# Patient Record
Sex: Female | Born: 1950 | Hispanic: No | Marital: Married | Smoking: Never smoker
Health system: Southern US, Community
[De-identification: ages and names within clinical notes are randomized; demographics above are authoritative.]

## PROBLEM LIST (undated history)

## (undated) DIAGNOSIS — S0280XA Fracture of other specified skull and facial bones, unspecified side, initial encounter for closed fracture: Secondary | ICD-10-CM

## (undated) DIAGNOSIS — R269 Unspecified abnormalities of gait and mobility: Secondary | ICD-10-CM

## (undated) DIAGNOSIS — K219 Gastro-esophageal reflux disease without esophagitis: Secondary | ICD-10-CM

## (undated) DIAGNOSIS — E0590 Thyrotoxicosis, unspecified without thyrotoxic crisis or storm: Secondary | ICD-10-CM

## (undated) DIAGNOSIS — Z7409 Other reduced mobility: Secondary | ICD-10-CM

## (undated) DIAGNOSIS — E559 Vitamin D deficiency, unspecified: Secondary | ICD-10-CM

## (undated) DIAGNOSIS — I6200 Nontraumatic subdural hemorrhage, unspecified: Secondary | ICD-10-CM

## (undated) DIAGNOSIS — W19XXXA Unspecified fall, initial encounter: Secondary | ICD-10-CM

## (undated) DIAGNOSIS — E7800 Pure hypercholesterolemia, unspecified: Secondary | ICD-10-CM

## (undated) DIAGNOSIS — Y92009 Unspecified place in unspecified non-institutional (private) residence as the place of occurrence of the external cause: Secondary | ICD-10-CM

## (undated) DIAGNOSIS — R569 Unspecified convulsions: Secondary | ICD-10-CM

## (undated) DIAGNOSIS — R0602 Shortness of breath: Secondary | ICD-10-CM

## (undated) HISTORY — DX: Unspecified abnormalities of gait and mobility: R26.9

## (undated) HISTORY — DX: Nontraumatic subdural hemorrhage, unspecified: I62.00

## (undated) HISTORY — DX: Fracture of other specified skull and facial bones, unspecified side, initial encounter for closed fracture: S02.80XA

---
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
## 2003-10-31 HISTORY — PX: BRAIN SURGERY: SHX531

## 2006-07-02 ENCOUNTER — Other Ambulatory Visit: Admission: RE | Admit: 2006-07-02 | Discharge: 2006-07-02 | Payer: Self-pay | Admitting: *Deleted

## 2006-11-12 ENCOUNTER — Encounter: Admission: RE | Admit: 2006-11-12 | Discharge: 2006-12-08 | Payer: Self-pay | Admitting: Neurology

## 2006-12-09 ENCOUNTER — Encounter: Admission: RE | Admit: 2006-12-09 | Discharge: 2007-01-12 | Payer: Self-pay | Admitting: Neurology

## 2007-01-13 ENCOUNTER — Encounter: Admission: RE | Admit: 2007-01-13 | Discharge: 2007-02-15 | Payer: Self-pay | Admitting: Neurology

## 2007-05-05 ENCOUNTER — Emergency Department (HOSPITAL_COMMUNITY): Admission: EM | Admit: 2007-05-05 | Discharge: 2007-05-05 | Payer: Self-pay | Admitting: *Deleted

## 2007-09-10 ENCOUNTER — Emergency Department (HOSPITAL_COMMUNITY): Admission: EM | Admit: 2007-09-10 | Discharge: 2007-09-10 | Payer: Self-pay | Admitting: Emergency Medicine

## 2007-10-05 ENCOUNTER — Emergency Department (HOSPITAL_COMMUNITY): Admission: EM | Admit: 2007-10-05 | Discharge: 2007-10-05 | Payer: Self-pay | Admitting: Emergency Medicine

## 2007-12-28 ENCOUNTER — Ambulatory Visit: Payer: Self-pay | Admitting: Internal Medicine

## 2007-12-28 DIAGNOSIS — R059 Cough, unspecified: Secondary | ICD-10-CM | POA: Insufficient documentation

## 2007-12-28 DIAGNOSIS — R05 Cough: Secondary | ICD-10-CM

## 2007-12-30 ENCOUNTER — Telehealth (INDEPENDENT_AMBULATORY_CARE_PROVIDER_SITE_OTHER): Payer: Self-pay | Admitting: *Deleted

## 2008-01-31 ENCOUNTER — Ambulatory Visit: Payer: Self-pay | Admitting: Internal Medicine

## 2008-02-01 ENCOUNTER — Ambulatory Visit: Payer: Self-pay | Admitting: Cardiology

## 2008-02-06 ENCOUNTER — Telehealth (INDEPENDENT_AMBULATORY_CARE_PROVIDER_SITE_OTHER): Payer: Self-pay | Admitting: *Deleted

## 2008-04-05 ENCOUNTER — Encounter: Admission: RE | Admit: 2008-04-05 | Discharge: 2008-04-19 | Payer: Self-pay | Admitting: Neurology

## 2008-04-17 ENCOUNTER — Ambulatory Visit (HOSPITAL_COMMUNITY): Admission: RE | Admit: 2008-04-17 | Discharge: 2008-04-17 | Payer: Self-pay | Admitting: Family Medicine

## 2008-05-07 ENCOUNTER — Ambulatory Visit: Payer: Self-pay | Admitting: Internal Medicine

## 2008-06-06 ENCOUNTER — Ambulatory Visit: Payer: Self-pay | Admitting: Internal Medicine

## 2008-06-21 ENCOUNTER — Encounter: Payer: Self-pay | Admitting: Internal Medicine

## 2008-06-21 ENCOUNTER — Ambulatory Visit (HOSPITAL_COMMUNITY): Admission: RE | Admit: 2008-06-21 | Discharge: 2008-06-21 | Payer: Self-pay | Admitting: Internal Medicine

## 2008-06-29 ENCOUNTER — Ambulatory Visit: Payer: Self-pay | Admitting: Internal Medicine

## 2008-07-30 ENCOUNTER — Ambulatory Visit: Payer: Self-pay | Admitting: Internal Medicine

## 2008-09-03 ENCOUNTER — Ambulatory Visit: Payer: Self-pay | Admitting: Internal Medicine

## 2008-12-07 ENCOUNTER — Other Ambulatory Visit: Admission: RE | Admit: 2008-12-07 | Discharge: 2008-12-07 | Payer: Self-pay | Admitting: Family Medicine

## 2009-08-19 ENCOUNTER — Ambulatory Visit: Payer: Self-pay | Admitting: Psychology

## 2011-03-14 ENCOUNTER — Emergency Department (HOSPITAL_COMMUNITY): Payer: Medicare Other

## 2011-03-14 ENCOUNTER — Emergency Department (HOSPITAL_COMMUNITY)
Admission: EM | Admit: 2011-03-14 | Discharge: 2011-03-14 | Disposition: A | Discharge status: Home / Self Care | Payer: Medicare Other | Attending: Emergency Medicine | Admitting: Emergency Medicine

## 2011-03-14 DIAGNOSIS — R5381 Other malaise: Secondary | ICD-10-CM | POA: Insufficient documentation

## 2011-03-14 DIAGNOSIS — E7800 Pure hypercholesterolemia, unspecified: Secondary | ICD-10-CM | POA: Insufficient documentation

## 2011-03-14 DIAGNOSIS — Y929 Unspecified place or not applicable: Secondary | ICD-10-CM | POA: Insufficient documentation

## 2011-03-14 DIAGNOSIS — S0990XA Unspecified injury of head, initial encounter: Secondary | ICD-10-CM | POA: Insufficient documentation

## 2011-03-14 DIAGNOSIS — R7309 Other abnormal glucose: Secondary | ICD-10-CM | POA: Insufficient documentation

## 2011-03-14 DIAGNOSIS — R42 Dizziness and giddiness: Secondary | ICD-10-CM | POA: Insufficient documentation

## 2011-03-14 DIAGNOSIS — R51 Headache: Secondary | ICD-10-CM | POA: Insufficient documentation

## 2011-03-14 DIAGNOSIS — R55 Syncope and collapse: Secondary | ICD-10-CM | POA: Insufficient documentation

## 2011-03-14 DIAGNOSIS — R404 Transient alteration of awareness: Secondary | ICD-10-CM | POA: Insufficient documentation

## 2011-03-14 DIAGNOSIS — Z79899 Other long term (current) drug therapy: Secondary | ICD-10-CM | POA: Insufficient documentation

## 2011-03-14 DIAGNOSIS — W108XXA Fall (on) (from) other stairs and steps, initial encounter: Secondary | ICD-10-CM | POA: Insufficient documentation

## 2011-03-14 LAB — URINALYSIS, ROUTINE W REFLEX MICROSCOPIC
Bilirubin Urine: NEGATIVE
Glucose, UA: NEGATIVE mg/dL
Hgb urine dipstick: NEGATIVE
Nitrite: NEGATIVE
Protein, ur: NEGATIVE mg/dL
Specific Gravity, Urine: 1.008 (ref 1.005–1.030)
pH: 6.5 (ref 5.0–8.0)

## 2011-03-14 LAB — DIFFERENTIAL
Eosinophils Absolute: 0.1 10*3/uL (ref 0.0–0.7)
Lymphocytes Relative: 19 % (ref 12–46)
Neutro Abs: 6 10*3/uL (ref 1.7–7.7)

## 2011-03-14 LAB — CBC
Hemoglobin: 13.3 g/dL (ref 12.0–15.0)
MCHC: 34.8 g/dL (ref 30.0–36.0)
Platelets: 212 10*3/uL (ref 150–400)
RDW: 12.1 % (ref 11.5–15.5)

## 2011-03-14 LAB — BASIC METABOLIC PANEL
CO2: 24 mEq/L (ref 19–32)
Calcium: 9.2 mg/dL (ref 8.4–10.5)
GFR calc Af Amer: >60 mL/min (ref >60)
Sodium: 134 mEq/L — ABNORMAL LOW (ref 135–145)

## 2011-04-09 ENCOUNTER — Ambulatory Visit: Payer: Medicare Other | Attending: Family Medicine | Admitting: Physical Therapy

## 2011-04-09 DIAGNOSIS — R269 Unspecified abnormalities of gait and mobility: Secondary | ICD-10-CM | POA: Insufficient documentation

## 2011-04-09 DIAGNOSIS — R262 Difficulty in walking, not elsewhere classified: Secondary | ICD-10-CM | POA: Insufficient documentation

## 2011-04-09 DIAGNOSIS — IMO0001 Reserved for inherently not codable concepts without codable children: Secondary | ICD-10-CM | POA: Insufficient documentation

## 2011-04-09 DIAGNOSIS — M542 Cervicalgia: Secondary | ICD-10-CM | POA: Insufficient documentation

## 2011-04-09 DIAGNOSIS — M25519 Pain in unspecified shoulder: Secondary | ICD-10-CM | POA: Insufficient documentation

## 2011-04-15 ENCOUNTER — Ambulatory Visit: Payer: Medicare Other | Admitting: Physical Therapy

## 2011-04-21 ENCOUNTER — Ambulatory Visit: Payer: Medicare Other | Admitting: Physical Therapy

## 2011-04-23 ENCOUNTER — Ambulatory Visit: Payer: Medicare Other | Admitting: Physical Therapy

## 2011-04-28 ENCOUNTER — Ambulatory Visit: Payer: Medicare Other | Admitting: Physical Therapy

## 2011-04-30 ENCOUNTER — Encounter: Payer: Medicare Other | Admitting: Physical Therapy

## 2011-04-30 ENCOUNTER — Ambulatory Visit: Payer: Medicare Other | Attending: Family Medicine | Admitting: Physical Therapy

## 2011-04-30 DIAGNOSIS — M25519 Pain in unspecified shoulder: Secondary | ICD-10-CM | POA: Insufficient documentation

## 2011-04-30 DIAGNOSIS — M542 Cervicalgia: Secondary | ICD-10-CM | POA: Insufficient documentation

## 2011-04-30 DIAGNOSIS — R269 Unspecified abnormalities of gait and mobility: Secondary | ICD-10-CM | POA: Insufficient documentation

## 2011-04-30 DIAGNOSIS — IMO0001 Reserved for inherently not codable concepts without codable children: Secondary | ICD-10-CM | POA: Insufficient documentation

## 2011-04-30 DIAGNOSIS — R262 Difficulty in walking, not elsewhere classified: Secondary | ICD-10-CM | POA: Insufficient documentation

## 2011-05-05 ENCOUNTER — Ambulatory Visit: Payer: Medicare Other | Attending: Family Medicine | Admitting: Physical Therapy

## 2011-05-05 DIAGNOSIS — R262 Difficulty in walking, not elsewhere classified: Secondary | ICD-10-CM | POA: Insufficient documentation

## 2011-05-05 DIAGNOSIS — M542 Cervicalgia: Secondary | ICD-10-CM | POA: Insufficient documentation

## 2011-05-05 DIAGNOSIS — M25519 Pain in unspecified shoulder: Secondary | ICD-10-CM | POA: Insufficient documentation

## 2011-05-05 DIAGNOSIS — IMO0001 Reserved for inherently not codable concepts without codable children: Secondary | ICD-10-CM | POA: Insufficient documentation

## 2011-05-05 DIAGNOSIS — R269 Unspecified abnormalities of gait and mobility: Secondary | ICD-10-CM | POA: Insufficient documentation

## 2011-05-07 ENCOUNTER — Ambulatory Visit: Payer: Medicare Other | Admitting: Physical Therapy

## 2011-05-12 ENCOUNTER — Ambulatory Visit: Payer: Medicare Other | Admitting: Physical Therapy

## 2011-05-14 ENCOUNTER — Ambulatory Visit: Payer: Medicare Other | Admitting: Physical Therapy

## 2011-09-08 LAB — BASIC METABOLIC PANEL
BUN: 14
Calcium: 8.5
Creatinine, Ser: 0.87
GFR calc Af Amer: >60
GFR calc non Af Amer: >60
Glucose, Bld: 154 — ABNORMAL HIGH
Sodium: 135

## 2011-09-08 LAB — DIFFERENTIAL
Basophils Relative: 0
Eosinophils Relative: 0
Lymphocytes Relative: 6 — ABNORMAL LOW
Neutro Abs: 11.3 — ABNORMAL HIGH

## 2011-09-08 LAB — URINALYSIS, ROUTINE W REFLEX MICROSCOPIC: Urobilinogen, UA: 0.2

## 2011-09-08 LAB — CBC
HCT: 36.5
Hemoglobin: 12.9
MCHC: 35.3
MCV: 90.9
RBC: 4.02

## 2011-09-08 LAB — URINE MICROSCOPIC-ADD ON

## 2012-01-18 ENCOUNTER — Other Ambulatory Visit (HOSPITAL_COMMUNITY)
Admission: RE | Admit: 2012-01-18 | Discharge: 2012-01-18 | Disposition: A | Discharge status: Home / Self Care | Payer: Medicare Other | Source: Ambulatory Visit | Attending: Family Medicine | Admitting: Family Medicine

## 2012-01-18 ENCOUNTER — Other Ambulatory Visit: Payer: Self-pay | Admitting: Family Medicine

## 2012-01-18 DIAGNOSIS — Z124 Encounter for screening for malignant neoplasm of cervix: Secondary | ICD-10-CM | POA: Insufficient documentation

## 2012-01-18 DIAGNOSIS — E785 Hyperlipidemia, unspecified: Secondary | ICD-10-CM | POA: Diagnosis not present

## 2012-01-18 DIAGNOSIS — Z0000 Encounter for general adult medical examination without abnormal findings: Secondary | ICD-10-CM | POA: Diagnosis not present

## 2012-01-18 DIAGNOSIS — F8089 Other developmental disorders of speech and language: Secondary | ICD-10-CM | POA: Diagnosis not present

## 2012-01-18 DIAGNOSIS — M949 Disorder of cartilage, unspecified: Secondary | ICD-10-CM | POA: Diagnosis not present

## 2012-01-18 DIAGNOSIS — E559 Vitamin D deficiency, unspecified: Secondary | ICD-10-CM | POA: Diagnosis not present

## 2012-01-18 DIAGNOSIS — M899 Disorder of bone, unspecified: Secondary | ICD-10-CM | POA: Diagnosis not present

## 2012-01-18 DIAGNOSIS — R269 Unspecified abnormalities of gait and mobility: Secondary | ICD-10-CM | POA: Diagnosis not present

## 2012-01-18 DIAGNOSIS — Z23 Encounter for immunization: Secondary | ICD-10-CM | POA: Diagnosis not present

## 2012-01-28 DIAGNOSIS — M899 Disorder of bone, unspecified: Secondary | ICD-10-CM | POA: Diagnosis not present

## 2012-01-28 DIAGNOSIS — M949 Disorder of cartilage, unspecified: Secondary | ICD-10-CM | POA: Diagnosis not present

## 2012-01-28 DIAGNOSIS — Z1231 Encounter for screening mammogram for malignant neoplasm of breast: Secondary | ICD-10-CM | POA: Diagnosis not present

## 2012-02-11 DIAGNOSIS — N63 Unspecified lump in unspecified breast: Secondary | ICD-10-CM | POA: Diagnosis not present

## 2012-03-30 DIAGNOSIS — J309 Allergic rhinitis, unspecified: Secondary | ICD-10-CM | POA: Diagnosis not present

## 2012-03-30 DIAGNOSIS — E7800 Pure hypercholesterolemia, unspecified: Secondary | ICD-10-CM | POA: Diagnosis not present

## 2012-07-14 DIAGNOSIS — E7800 Pure hypercholesterolemia, unspecified: Secondary | ICD-10-CM | POA: Diagnosis not present

## 2012-07-21 DIAGNOSIS — E7800 Pure hypercholesterolemia, unspecified: Secondary | ICD-10-CM | POA: Diagnosis not present

## 2012-07-21 DIAGNOSIS — R5381 Other malaise: Secondary | ICD-10-CM | POA: Diagnosis not present

## 2012-07-21 DIAGNOSIS — R5383 Other fatigue: Secondary | ICD-10-CM | POA: Diagnosis not present

## 2012-07-21 DIAGNOSIS — E559 Vitamin D deficiency, unspecified: Secondary | ICD-10-CM | POA: Diagnosis not present

## 2012-08-03 DIAGNOSIS — H35349 Macular cyst, hole, or pseudohole, unspecified eye: Secondary | ICD-10-CM | POA: Diagnosis not present

## 2012-08-03 DIAGNOSIS — H2510 Age-related nuclear cataract, unspecified eye: Secondary | ICD-10-CM | POA: Diagnosis not present

## 2012-08-04 DIAGNOSIS — H5200 Hypermetropia, unspecified eye: Secondary | ICD-10-CM | POA: Diagnosis not present

## 2012-08-04 DIAGNOSIS — H2510 Age-related nuclear cataract, unspecified eye: Secondary | ICD-10-CM | POA: Diagnosis not present

## 2012-08-04 DIAGNOSIS — H52229 Regular astigmatism, unspecified eye: Secondary | ICD-10-CM | POA: Diagnosis not present

## 2012-08-15 DIAGNOSIS — H2510 Age-related nuclear cataract, unspecified eye: Secondary | ICD-10-CM | POA: Diagnosis not present

## 2012-09-06 DIAGNOSIS — R040 Epistaxis: Secondary | ICD-10-CM | POA: Diagnosis not present

## 2012-09-12 DIAGNOSIS — R040 Epistaxis: Secondary | ICD-10-CM | POA: Diagnosis not present

## 2012-09-30 ENCOUNTER — Encounter (HOSPITAL_COMMUNITY): Payer: Self-pay | Admitting: Emergency Medicine

## 2012-09-30 ENCOUNTER — Emergency Department (HOSPITAL_COMMUNITY): Payer: Medicare Other

## 2012-09-30 ENCOUNTER — Emergency Department (HOSPITAL_COMMUNITY)
Admission: EM | Admit: 2012-09-30 | Discharge: 2012-10-01 | Disposition: A | Discharge status: Home / Self Care | Payer: Medicare Other | Attending: Emergency Medicine | Admitting: Emergency Medicine

## 2012-09-30 DIAGNOSIS — S0003XA Contusion of scalp, initial encounter: Secondary | ICD-10-CM | POA: Insufficient documentation

## 2012-09-30 DIAGNOSIS — S0990XA Unspecified injury of head, initial encounter: Secondary | ICD-10-CM | POA: Diagnosis not present

## 2012-09-30 DIAGNOSIS — E7800 Pure hypercholesterolemia, unspecified: Secondary | ICD-10-CM | POA: Insufficient documentation

## 2012-09-30 DIAGNOSIS — R269 Unspecified abnormalities of gait and mobility: Secondary | ICD-10-CM | POA: Diagnosis not present

## 2012-09-30 DIAGNOSIS — W1809XA Striking against other object with subsequent fall, initial encounter: Secondary | ICD-10-CM | POA: Insufficient documentation

## 2012-09-30 DIAGNOSIS — E559 Vitamin D deficiency, unspecified: Secondary | ICD-10-CM | POA: Insufficient documentation

## 2012-09-30 DIAGNOSIS — R221 Localized swelling, mass and lump, neck: Secondary | ICD-10-CM | POA: Diagnosis not present

## 2012-09-30 DIAGNOSIS — R220 Localized swelling, mass and lump, head: Secondary | ICD-10-CM | POA: Diagnosis not present

## 2012-09-30 DIAGNOSIS — S199XXA Unspecified injury of neck, initial encounter: Secondary | ICD-10-CM | POA: Diagnosis not present

## 2012-09-30 DIAGNOSIS — Y9389 Activity, other specified: Secondary | ICD-10-CM | POA: Insufficient documentation

## 2012-09-30 DIAGNOSIS — Y92009 Unspecified place in unspecified non-institutional (private) residence as the place of occurrence of the external cause: Secondary | ICD-10-CM | POA: Insufficient documentation

## 2012-09-30 DIAGNOSIS — S0083XA Contusion of other part of head, initial encounter: Secondary | ICD-10-CM | POA: Diagnosis not present

## 2012-09-30 DIAGNOSIS — S0993XA Unspecified injury of face, initial encounter: Secondary | ICD-10-CM | POA: Diagnosis not present

## 2012-09-30 HISTORY — DX: Other reduced mobility: Z74.09

## 2012-09-30 HISTORY — DX: Vitamin D deficiency, unspecified: E55.9

## 2012-09-30 HISTORY — DX: Pure hypercholesterolemia, unspecified: E78.00

## 2012-09-30 NOTE — ED Notes (Addendum)
Pt reports falling at home, hx of mobility problems, states she lost her balance and fell and hit back of head, pt has bump to back of head, denies LOC; c-collar placed at triage; pt a&ox4, grips equal; face symmetrical, no drift; pt has unsteady gait but does report the same hx

## 2012-09-30 NOTE — ED Notes (Addendum)
Spoke with Dr Fonnie Jarvis re: pt falling and hit back of head; v.o for CT head; did not want to order C-spine at this time

## 2012-09-30 NOTE — ED Notes (Signed)
When brought pt back to room, husband was very upset that pt was not brought back immediately to room, tried to explain to husband the process of the ED, and that I have already placed an order for CT head, and husband was upset that she didn't have immediately done; i explained delay, and reassured him that pt's vitals have been fine, and that pt is awake and speaking with me and has no deficits in her normal; husband still very upset, spoke with Barbara Cower AD and he will come speak with pt/family

## 2012-10-01 MED ORDER — OXYCODONE-ACETAMINOPHEN 5-325 MG PO TABS
2.0000 | ORAL_TABLET | Freq: Once | ORAL | Status: AC
  Administered 2012-10-01: 2 via ORAL
  Filled 2012-10-01: qty 2

## 2012-10-01 MED ORDER — NAPROXEN 500 MG PO TABS: 500.0000 mg | ORAL_TABLET | Freq: Two times a day (BID) | ORAL | Status: DC

## 2012-10-01 NOTE — ED Provider Notes (Signed)
History     CSN: [id number]  Arrival date & time 09/30/12  2118   First MD Initiated Contact with Patient 10/01/12 0015      Chief Complaint  Patient presents with  . Fall    (Consider location/radiation/quality/duration/timing/severity/associated sxs/prior treatment) HPI Comments: 61 year old female with a history of a subdural hematoma in 2004 which required surgical drainage. Since that time she has had a gait disturbance and instability when she walks because of balance problems. This evening after she sneezed she lost her balance falling backwards and striking the right posterior occiput on the ground. There was no loss of consciousness, no seizures. She has persistent headache which is mild, worse with palpation over the hematoma and not associated with blurred vision numbness or weakness. Her mental status is normal according to the patient and her spouse.  Patient is a 61 y.o. female presenting with fall. The history is provided by the patient and the spouse.  Fall    Past Medical History  Diagnosis Date  . Mobility impaired   . Hypercholesteremia   . Vitamin D deficiency     Past Surgical History  Procedure Date  . Subdural hemorrhage drainage   . Appendectomy     History reviewed. No pertinent family history.  History  Substance Use Topics  . Smoking status: Never Smoker   . Smokeless tobacco: Not on file  . Alcohol Use: No    OB History    Grav Para Term Preterm Abortions TAB SAB Ect Mult Living                  Review of Systems  All other systems reviewed and are negative.    Allergies  Nitrofurantoin and Sulfamethoxazole w-trimethoprim  Home Medications   Current Outpatient Rx  Name Route Sig Dispense Refill  . ERGOCALCIFEROL 50000 UNITS PO CAPS Oral Take 50,000 Units by mouth once a week. On saturday    . NAPROXEN 500 MG PO TABS Oral Take 1 tablet (500 mg total) by mouth 2 (two) times daily with a meal. 30 tablet 0    BP 126/72  Pulse  85  Temp 98.3 F (36.8 C) (Oral)  Resp 18  SpO2 99%  Physical Exam  Nursing note and vitals reviewed. Constitutional: She appears well-developed and well-nourished. No distress.  HENT:  Head: Normocephalic.  Mouth/Throat: Oropharynx is clear and moist. No oropharyngeal exudate.       Hematoma to the right posterior occiput, small, no laceration  Eyes: Conjunctivae normal and EOM are normal. Pupils are equal, round, and reactive to light. Right eye exhibits no discharge. Left eye exhibits no discharge. No scleral icterus.  Neck: No JVD present. No thyromegaly present.  Cardiovascular: Normal rate, regular rhythm, normal heart sounds and intact distal pulses.  Exam reveals no gallop and no friction rub.   No murmur heard. Pulmonary/Chest: Effort normal and breath sounds normal. No respiratory distress. She has no wheezes. She has no rales.  Abdominal: Soft. Bowel sounds are normal. She exhibits no distension and no mass. There is no tenderness.  Musculoskeletal: Normal range of motion. She exhibits no edema and no tenderness.       No cervical spine tenderness  Lymphadenopathy:    She has no cervical adenopathy.  Neurological: She is alert. Coordination normal.       Normal finger-nose-finger, normal speech, normal strength in all 4 extremities, follows commands without difficulty  Skin: Skin is warm and dry. No rash noted. No erythema.  Psychiatric:  She has a normal mood and affect. Her behavior is normal.    ED Course  Procedures (including critical care time)  Labs Reviewed - No data to display Ct Head Wo Contrast  10/01/2012  *RADIOLOGY REPORT*  Clinical Data:  Fall.  CT HEAD WITHOUT CONTRAST CT CERVICAL SPINE WITHOUT CONTRAST  Technique:  Multidetector CT imaging of the head and cervical spine was performed following the standard protocol without intravenous contrast.  Multiplanar CT image reconstructions of the cervical spine were also generated.  Comparison:  CT head and  cervical spine 03/14/2011  CT HEAD  Findings: Postoperative changes with prior left parietal craniotomy.  Underlying encephalomalacia in the left parietal/occipital region.  No significant change since previous study.  There is diffuse cerebral atrophy.  Mild ventricular dilatation suggesting central atrophy.  No mass effect or midline shift.  No abnormal extra-axial fluid collections.  The gray-white matter junctions are distinct.  The basal cisterns are not effaced. No evidence of acute intracranial hemorrhage.  There is a subcutaneous scalp hematoma over the right posterior parietal region.  No underlying skull fractures.  Visualized paranasal sinuses and mastoid air cells are not opacified.  IMPRESSION: Right posterior parietal subcutaneous scalp hematoma.  Old the left parietal postoperative changes and encephalomalacia.  No acute intracranial abnormalities demonstrated.  CT CERVICAL SPINE  Findings: There is slight anterior subluxation of C4-C5 which is stable since the previous study and is likely degenerative. Otherwise normal alignment of the cervical vertebrae and facet joints.  There are degenerative changes throughout the cervical spine with narrowed cervical interspaces and endplate hypertrophic changes at C4-5, C5-6, and C6-7 levels.  Prominent disc osteophyte complex and C5-6 causes some anterior effacement of the thecal sac. This appears stable.  No vertebral compression deformities.  No prevertebral soft tissue swelling.  Lateral masses of C1 appear symmetrical.  The odontoid process appears intact.  No focal bone lesion or bone destruction.  The bone cortex and trabecular architecture appear intact.  IMPRESSION: Degenerative changes in the cervical spine without change since previous study.  No displaced fractures identified.   Original Report Authenticated By: Burman Nieves, M.D.    Ct Cervical Spine Wo Contrast  10/01/2012  *RADIOLOGY REPORT*  Clinical Data:  Fall.  CT HEAD WITHOUT CONTRAST  CT CERVICAL SPINE WITHOUT CONTRAST  Technique:  Multidetector CT imaging of the head and cervical spine was performed following the standard protocol without intravenous contrast.  Multiplanar CT image reconstructions of the cervical spine were also generated.  Comparison:  CT head and cervical spine 03/14/2011  CT HEAD  Findings: Postoperative changes with prior left parietal craniotomy.  Underlying encephalomalacia in the left parietal/occipital region.  No significant change since previous study.  There is diffuse cerebral atrophy.  Mild ventricular dilatation suggesting central atrophy.  No mass effect or midline shift.  No abnormal extra-axial fluid collections.  The gray-white matter junctions are distinct.  The basal cisterns are not effaced. No evidence of acute intracranial hemorrhage.  There is a subcutaneous scalp hematoma over the right posterior parietal region.  No underlying skull fractures.  Visualized paranasal sinuses and mastoid air cells are not opacified.  IMPRESSION: Right posterior parietal subcutaneous scalp hematoma.  Old the left parietal postoperative changes and encephalomalacia.  No acute intracranial abnormalities demonstrated.  CT CERVICAL SPINE  Findings: There is slight anterior subluxation of C4-C5 which is stable since the previous study and is likely degenerative. Otherwise normal alignment of the cervical vertebrae and facet joints.  There are degenerative changes  throughout the cervical spine with narrowed cervical interspaces and endplate hypertrophic changes at C4-5, C5-6, and C6-7 levels.  Prominent disc osteophyte complex and C5-6 causes some anterior effacement of the thecal sac. This appears stable.  No vertebral compression deformities.  No prevertebral soft tissue swelling.  Lateral masses of C1 appear symmetrical.  The odontoid process appears intact.  No focal bone lesion or bone destruction.  The bone cortex and trabecular architecture appear intact.  IMPRESSION:  Degenerative changes in the cervical spine without change since previous study.  No displaced fractures identified.   Original Report Authenticated By: Burman Nieves, M.D.      1. Contusion of scalp       MDM  CT scan of the head and the cervical spine were read as negative in regards to orthopedic fracture or intracerebral injury or hemorrhage. The patient sustained a minor head injury with an associated scalp hematoma, ice packs have been given, pain medications have been given, the patient will be discharged in improved condition to followup with her family Dr. Cervical spine collar removed prior to discharge.  Discharge Prescriptions include:  naprosyn        Vida Roller, MD 10/01/12 [phone]

## 2012-10-01 NOTE — ED Notes (Signed)
Patient given copy of discharge paperwork; went over discharge instructions with patient.  Patient instructed to follow up with primary care physician as needed, to take prescriptions as directed, and to return to the ED for new, worsening, or concerning symptoms.

## 2012-10-01 NOTE — ED Notes (Signed)
Patient back from CT scan; currently sitting up in bed.  No respiratory or acute distress noted.  Patient complaining of a fall; states that she was walking in home when she tripped, lost her balance, and fell.  Reports hitting back of head on soft floor.  Hematoma noted to back of head.  Denies syncopal episode, dizziness, and weakness.  Stroke scale negative.  Patient reports history of unstable gait and falls. C-collar applied at this time. Primary RN currently in with a critical patient.  Patient given ice pack; applied to hematoma/knot of occipital region of head.  EDP denies any additional orders at this time.  Patient alert and oriented x4; PERRL present.  Husband present at bedside.  Will continue to monitor.

## 2012-12-30 DIAGNOSIS — M542 Cervicalgia: Secondary | ICD-10-CM | POA: Diagnosis not present

## 2013-01-02 DIAGNOSIS — R259 Unspecified abnormal involuntary movements: Secondary | ICD-10-CM | POA: Diagnosis not present

## 2013-01-02 DIAGNOSIS — R269 Unspecified abnormalities of gait and mobility: Secondary | ICD-10-CM | POA: Diagnosis not present

## 2013-01-02 DIAGNOSIS — R1310 Dysphagia, unspecified: Secondary | ICD-10-CM | POA: Diagnosis not present

## 2013-01-02 DIAGNOSIS — G119 Hereditary ataxia, unspecified: Secondary | ICD-10-CM | POA: Diagnosis not present

## 2013-01-05 DIAGNOSIS — G542 Cervical root disorders, not elsewhere classified: Secondary | ICD-10-CM | POA: Diagnosis not present

## 2013-01-05 DIAGNOSIS — G544 Lumbosacral root disorders, not elsewhere classified: Secondary | ICD-10-CM | POA: Diagnosis not present

## 2013-01-11 ENCOUNTER — Other Ambulatory Visit (HOSPITAL_COMMUNITY): Payer: Self-pay | Admitting: Neurology

## 2013-01-11 DIAGNOSIS — R1310 Dysphagia, unspecified: Secondary | ICD-10-CM

## 2013-01-17 ENCOUNTER — Ambulatory Visit (HOSPITAL_COMMUNITY)
Admission: RE | Admit: 2013-01-17 | Discharge: 2013-01-17 | Disposition: A | Discharge status: Home / Self Care | Payer: Medicare Other | Source: Ambulatory Visit | Attending: Neurology | Admitting: Neurology

## 2013-01-17 DIAGNOSIS — R1310 Dysphagia, unspecified: Secondary | ICD-10-CM | POA: Insufficient documentation

## 2013-01-17 DIAGNOSIS — R1313 Dysphagia, pharyngeal phase: Secondary | ICD-10-CM | POA: Diagnosis not present

## 2013-01-17 NOTE — Procedures (Signed)
Objective Swallowing Evaluation: Modified Barium Swallowing Study  Patient Details  Name: Claudia Perez MRN: [id number] Date of Birth: [birth year]  Today's Date: 01/17/2013 Time: 8295-6213 SLP Time Calculation (min): 21 min  Past Medical History:  Past Medical History  Diagnosis Date  . Mobility impaired   . Hypercholesteremia   . Vitamin D deficiency    Past Surgical History:  Past Surgical History  Procedure Laterality Date  . Subdural hemorrhage drainage    . Appendectomy     HPI:  Pt is a 62 year old female with a history of brain injury, ataxia, dystonia, dysarthria and mild dysphagia. She has been seen by an outpatient SLP for dysarthria and voice therapy. She had an MBS in 2009 that did not show any significant dysphagia. Pt is referred today by her neurologist due to pt report that she is having increasing cough with PO intake. Pt also c/o GER though she does not take any medication for it. She denies any recent respiratory symptoms. She consumes a regular consistnecy diet with thin liquids.      Assessment / Plan / Recommendation Clinical Impression  Dysphagia Diagnosis: Mild pharyngeal phase dysphagia Clinical impression: Pt presents with a mild pharyngeal dysphagia with intermittently decreased airway closure. In approximately 25% of thin liquid trials there is trace to mild penetration without sensation. This likely explains pt report of occasional choking episodes;  Penetrate may build until sensed by pt and cough response is elicited.  A chin tuck does not consistnetly prevent penetration but an occasional throat clear and second swallow expel penetrate and remove trace residuals. Recommend that pt continue a regular diet with thin liquids, take pills whole in puree or pudding and follow compensatory strategies listed below. In addition, the pt may consider f/u with outpatient SLP to establish an exercise program. Pt reports this has helped her in the past and she feels that  her dysarthria is also mildy worse. Pt may benefit from excercises/strategies that promote strong airway closure.     Treatment Recommendation  Defer treatment plan to SLP at (Comment) (outpatient)    Diet Recommendation Regular;Thin liquid   Liquid Administration via: Cup;No straw Medication Administration: Whole meds with puree Supervision: Patient able to self feed Compensations: Slow rate;Small sips/bites;Clear throat intermittently;Multiple dry swallows after each bite/sip;Follow solids with liquid Postural Changes and/or Swallow Maneuvers: Seated upright 90 degrees;Upright 30-60 min after meal    Other  Recommendations     Follow Up Recommendations  Outpatient SLP    Frequency and Duration        Pertinent Vitals/Pain NA    SLP Swallow Goals     General HPI: Pt is a 62 year old female with a history of brain injury, ataxia, dystonia, dysarthria and mild dysphagia. She has been seen by an outpatient SLP for dysarthria and voice therapy. SHe had an MBS in 2009 that did not show any significant dysphagia. Pt is referred today by her neurologist due to pt repoert that thse is having increasing cough with PO intake. Pt also c/o GER though she does not take any medication for it. She denies any recent respiratory symptoms. She consumes a regular consistnecy diet with thin liquids.  Type of Study: Modified Barium Swallowing Study Reason for Referral: Objectively evaluate swallowing function Previous Swallow Assessment: MBS 2009 - Regular thin Diet Prior to this Study: Regular;Thin liquids Temperature Spikes Noted: No Respiratory Status: Room air History of Recent Intubation: No Behavior/Cognition: Alert;Cooperative;Pleasant mood Oral Cavity - Dentition: Adequate natural dentition Oral  Motor / Sensory Function: Within functional limits Self-Feeding Abilities: Able to feed self Patient Positioning: Upright in chair Baseline Vocal Quality: Clear Volitional Cough:  Strong Volitional Swallow: Able to elicit Anatomy: Within functional limits Pharyngeal Secretions: Not observed secondary MBS    Reason for Referral Objectively evaluate swallowing function   Oral Phase Oral Preparation/Oral Phase Oral Phase: WFL   Pharyngeal Phase Pharyngeal Phase Pharyngeal Phase: Impaired Pharyngeal - Thin Pharyngeal - Thin Cup: Penetration/Aspiration during swallow;Reduced airway/laryngeal closure;Pharyngeal residue - valleculae;Pharyngeal residue - pyriform sinuses Penetration/Aspiration details (thin cup): Material enters airway, remains ABOVE vocal cords and not ejected out Pharyngeal - Thin Straw: Penetration/Aspiration during swallow;Reduced airway/laryngeal closure;Pharyngeal residue - valleculae;Pharyngeal residue - pyriform sinuses Penetration/Aspiration details (thin straw): Material enters airway, remains ABOVE vocal cords and not ejected out Pharyngeal - Solids Pharyngeal - Puree: Within functional limits Pharyngeal - Regular: Within functional limits Pharyngeal - Pill: Penetration/Aspiration during swallow;Reduced airway/laryngeal closure;Pharyngeal residue - valleculae;Pharyngeal residue - pyriform sinuses;Trace aspiration Penetration/Aspiration details (pill): Material enters airway, CONTACTS cords and not ejected out  Cervical Esophageal Phase    GO    Cervical Esophageal Phase Cervical Esophageal Phase: Impaired Cervical Esophageal Phase - Comment Cervical Esophageal Comment: Esophageal sweep reaveals appearance of delayed transit of solids and liquids in cervical esophagus, just inferior to UES. A second swallow or sip of liquids initiates peristalsis and faciliatates transit.     Functional Assessment Tool Used: clinical judgement Functional Limitations: Swallowing Swallow Current Status (Z6109): At least 20 percent but less than 40 percent impaired, limited or restricted Swallow Goal Status [phone]): At least 1 percent but less than 20  percent impaired, limited or restricted Swallow Discharge Status [phone]): At least 1 percent but less than 20 percent impaired, limited or restricted    [facility], [place] CCC-SLP [phone]  Claudine Mouton 01/17/2013, 2:44 PM

## 2013-01-24 ENCOUNTER — Other Ambulatory Visit: Payer: Self-pay | Admitting: Neurology

## 2013-01-24 DIAGNOSIS — M47812 Spondylosis without myelopathy or radiculopathy, cervical region: Secondary | ICD-10-CM

## 2013-01-30 ENCOUNTER — Ambulatory Visit
Admission: RE | Admit: 2013-01-30 | Discharge: 2013-01-30 | Disposition: A | Discharge status: Home / Self Care | Payer: Medicare Other | Source: Ambulatory Visit | Attending: Neurology | Admitting: Neurology

## 2013-01-30 DIAGNOSIS — M47812 Spondylosis without myelopathy or radiculopathy, cervical region: Secondary | ICD-10-CM

## 2013-01-30 DIAGNOSIS — R269 Unspecified abnormalities of gait and mobility: Secondary | ICD-10-CM | POA: Diagnosis not present

## 2013-02-01 DIAGNOSIS — R259 Unspecified abnormal involuntary movements: Secondary | ICD-10-CM | POA: Diagnosis not present

## 2013-02-01 DIAGNOSIS — R1310 Dysphagia, unspecified: Secondary | ICD-10-CM | POA: Diagnosis not present

## 2013-02-01 DIAGNOSIS — R471 Dysarthria and anarthria: Secondary | ICD-10-CM | POA: Diagnosis not present

## 2013-02-01 DIAGNOSIS — G729 Myopathy, unspecified: Secondary | ICD-10-CM | POA: Diagnosis not present

## 2013-02-13 DIAGNOSIS — H2510 Age-related nuclear cataract, unspecified eye: Secondary | ICD-10-CM | POA: Diagnosis not present

## 2013-02-28 HISTORY — PX: EYE SURGERY: SHX253

## 2013-03-01 DIAGNOSIS — H2510 Age-related nuclear cataract, unspecified eye: Secondary | ICD-10-CM | POA: Diagnosis not present

## 2013-03-13 DIAGNOSIS — H2589 Other age-related cataract: Secondary | ICD-10-CM | POA: Diagnosis not present

## 2013-03-13 DIAGNOSIS — H2510 Age-related nuclear cataract, unspecified eye: Secondary | ICD-10-CM | POA: Diagnosis not present

## 2013-03-13 DIAGNOSIS — H269 Unspecified cataract: Secondary | ICD-10-CM | POA: Diagnosis not present

## 2013-03-20 DIAGNOSIS — H2510 Age-related nuclear cataract, unspecified eye: Secondary | ICD-10-CM | POA: Diagnosis not present

## 2013-03-26 ENCOUNTER — Encounter (HOSPITAL_COMMUNITY): Payer: Self-pay | Admitting: *Deleted

## 2013-03-26 DIAGNOSIS — R404 Transient alteration of awareness: Secondary | ICD-10-CM | POA: Diagnosis not present

## 2013-03-26 DIAGNOSIS — R9431 Abnormal electrocardiogram [ECG] [EKG]: Secondary | ICD-10-CM | POA: Diagnosis not present

## 2013-03-26 DIAGNOSIS — R0602 Shortness of breath: Secondary | ICD-10-CM | POA: Insufficient documentation

## 2013-03-26 DIAGNOSIS — W19XXXA Unspecified fall, initial encounter: Secondary | ICD-10-CM | POA: Insufficient documentation

## 2013-03-26 DIAGNOSIS — M25519 Pain in unspecified shoulder: Secondary | ICD-10-CM | POA: Diagnosis not present

## 2013-03-26 DIAGNOSIS — R55 Syncope and collapse: Principal | ICD-10-CM | POA: Insufficient documentation

## 2013-03-26 DIAGNOSIS — E0590 Thyrotoxicosis, unspecified without thyrotoxic crisis or storm: Secondary | ICD-10-CM | POA: Diagnosis not present

## 2013-03-26 DIAGNOSIS — S42033A Displaced fracture of lateral end of unspecified clavicle, initial encounter for closed fracture: Secondary | ICD-10-CM | POA: Diagnosis not present

## 2013-03-26 DIAGNOSIS — R05 Cough: Secondary | ICD-10-CM | POA: Diagnosis not present

## 2013-03-26 DIAGNOSIS — Y92009 Unspecified place in unspecified non-institutional (private) residence as the place of occurrence of the external cause: Secondary | ICD-10-CM | POA: Insufficient documentation

## 2013-03-26 NOTE — ED Notes (Signed)
The pt fell earlier tonight and totally lost consciousness.  She has no idea why she fell and originally she did not know her son.  At present alert c/o  Rt shoulder and head pain..  She has a history of the same

## 2013-03-27 ENCOUNTER — Emergency Department (HOSPITAL_COMMUNITY): Payer: Medicare Other

## 2013-03-27 ENCOUNTER — Encounter (HOSPITAL_COMMUNITY): Payer: Self-pay | Admitting: *Deleted

## 2013-03-27 ENCOUNTER — Observation Stay (HOSPITAL_COMMUNITY)
Admission: EM | Admit: 2013-03-27 | Discharge: 2013-03-28 | Disposition: A | Discharge status: Home / Self Care | Payer: Medicare Other | Attending: Internal Medicine | Admitting: Internal Medicine

## 2013-03-27 DIAGNOSIS — W19XXXA Unspecified fall, initial encounter: Secondary | ICD-10-CM

## 2013-03-27 DIAGNOSIS — S42002D Fracture of unspecified part of left clavicle, subsequent encounter for fracture with routine healing: Secondary | ICD-10-CM

## 2013-03-27 DIAGNOSIS — R05 Cough: Secondary | ICD-10-CM

## 2013-03-27 DIAGNOSIS — S42009A Fracture of unspecified part of unspecified clavicle, initial encounter for closed fracture: Secondary | ICD-10-CM | POA: Diagnosis not present

## 2013-03-27 DIAGNOSIS — S42033A Displaced fracture of lateral end of unspecified clavicle, initial encounter for closed fracture: Secondary | ICD-10-CM | POA: Diagnosis not present

## 2013-03-27 DIAGNOSIS — R404 Transient alteration of awareness: Secondary | ICD-10-CM | POA: Diagnosis not present

## 2013-03-27 DIAGNOSIS — S42001A Fracture of unspecified part of right clavicle, initial encounter for closed fracture: Secondary | ICD-10-CM

## 2013-03-27 DIAGNOSIS — Y92009 Unspecified place in unspecified non-institutional (private) residence as the place of occurrence of the external cause: Secondary | ICD-10-CM

## 2013-03-27 DIAGNOSIS — R55 Syncope and collapse: Secondary | ICD-10-CM

## 2013-03-27 HISTORY — DX: Thyrotoxicosis, unspecified without thyrotoxic crisis or storm: E05.90

## 2013-03-27 HISTORY — DX: Gastro-esophageal reflux disease without esophagitis: K21.9

## 2013-03-27 HISTORY — DX: Shortness of breath: R06.02

## 2013-03-27 HISTORY — DX: Unspecified convulsions: R56.9

## 2013-03-27 HISTORY — DX: Unspecified place in unspecified non-institutional (private) residence as the place of occurrence of the external cause: Y92.009

## 2013-03-27 HISTORY — DX: Unspecified fall, initial encounter: W19.XXXA

## 2013-03-27 LAB — COMPREHENSIVE METABOLIC PANEL
ALT: 13 U/L (ref 0–35)
Albumin: 3.6 g/dL (ref 3.5–5.2)
BUN: 19 mg/dL (ref 6–23)
Calcium: 9.1 mg/dL (ref 8.4–10.5)
Chloride: 104 mEq/L (ref 96–112)
Creatinine, Ser: 0.69 mg/dL (ref 0.50–1.10)
GFR calc non Af Amer: >90 mL/min (ref >90)
Sodium: 140 mEq/L (ref 135–145)
Total Bilirubin: 0.3 mg/dL (ref 0.3–1.2)

## 2013-03-27 LAB — POCT I-STAT TROPONIN I: Troponin i, poc: 0.01 ng/mL (ref 0.00–0.08)

## 2013-03-27 LAB — CBC
HCT: 37.2 % (ref 36.0–46.0)
MCH: 31.5 pg (ref 26.0–34.0)
MCV: 90.1 fL (ref 78.0–100.0)
RDW: 12.8 % (ref 11.5–15.5)
WBC: 11.7 10*3/uL — ABNORMAL HIGH (ref 4.0–10.5)

## 2013-03-27 LAB — TROPONIN I: Troponin I: <0.3 ng/mL (ref <0.30)

## 2013-03-27 MED ORDER — ACETAMINOPHEN 325 MG PO TABS
650.0000 mg | ORAL_TABLET | Freq: Once | ORAL | Status: AC
  Administered 2013-03-27: 650 mg via ORAL
  Filled 2013-03-27: qty 2

## 2013-03-27 MED ORDER — ONDANSETRON HCL 4 MG/2ML IJ SOLN: 4.0000 mg | Freq: Four times a day (QID) | INTRAMUSCULAR | Status: DC | PRN

## 2013-03-27 MED ORDER — SODIUM CHLORIDE 0.9 % IV SOLN
INTRAVENOUS | Status: AC
  Administered 2013-03-27 – 2013-03-28 (×2): via INTRAVENOUS

## 2013-03-27 MED ORDER — PREDNISOLONE ACETATE 1 % OP SUSP
1.0000 [drp] | Freq: Two times a day (BID) | OPHTHALMIC | Status: DC
  Administered 2013-03-27: 1 [drp] via OPHTHALMIC
  Filled 2013-03-27: qty 1

## 2013-03-27 MED ORDER — ACETAMINOPHEN 650 MG RE SUPP: 650.0000 mg | Freq: Four times a day (QID) | RECTAL | Status: DC | PRN

## 2013-03-27 MED ORDER — ACETAMINOPHEN 325 MG PO TABS
650.0000 mg | ORAL_TABLET | Freq: Four times a day (QID) | ORAL | Status: DC | PRN
  Administered 2013-03-27 – 2013-03-28 (×3): 650 mg via ORAL
  Filled 2013-03-27 (×4): qty 2

## 2013-03-27 MED ORDER — ONDANSETRON HCL 4 MG PO TABS: 4.0000 mg | ORAL_TABLET | Freq: Four times a day (QID) | ORAL | Status: DC | PRN

## 2013-03-27 MED ORDER — SODIUM CHLORIDE 0.9 % IJ SOLN: 3.0000 mL | Freq: Two times a day (BID) | INTRAMUSCULAR | Status: DC

## 2013-03-27 NOTE — Progress Notes (Signed)
*  PRELIMINARY RESULTS* Echocardiogram 2D Echocardiogram has been performed.  Jeryl Columbia 03/27/2013, 10:36 AM

## 2013-03-27 NOTE — Progress Notes (Signed)
EEG  Completed; results pending. 

## 2013-03-27 NOTE — ED Notes (Signed)
Patient transported to X-ray 

## 2013-03-27 NOTE — Progress Notes (Signed)
Utilization review completed.  

## 2013-03-27 NOTE — H&P (Signed)
[facility] History and Physical  Bushra Denman WUJ:[id number] DOB: [birth year] DOA: 03/27/2013  Referring physician: Dr.Bonk. PCP: No primary provider on file. Dr. Juluis Rainier. Specialists: None.  Chief Complaint: Loss of consciousness.  HPI: Claudia Perez is a 62 y.o. female with previous history of subdural hematoma and had bur hole placed presents with complaints of fall and loss of consciousness. Patient states that she's been fine last night and about to go to the bed she was time to change her dress when suddenly she lost consciousness without any prodrome. Denies any chest pain shortness of breath palpitations or dizziness prior after the incident. Patient did not have any focal deficits after the incident and did not have any seizure-like activities or any incontinence of urine or tongue bite. Patient does not recall how long she lost consciousness but she was brought to the ER by her husband. CT head did not show any acute EKG was showing normal sinus rhythm. Since patient is having right-sided pain x-rays revealed fracture of the right clavicle. Patient has been admitted for further management of her syncope.  Review of Systems: As presented in the history of presenting illness, rest negative.  Past Medical History  Diagnosis Date  . Mobility impaired   . Hypercholesteremia   . Vitamin D deficiency    Past Surgical History  Procedure Laterality Date  . Subdural hemorrhage drainage    . Appendectomy     Social History:  reports that she has never smoked. She does not have any smokeless tobacco history on file. She reports that she does not drink alcohol or use illicit drugs. Lives at home. where does patient live-- Can do ADLs. Can patient participate in ADLs?  Allergies  Allergen Reactions  . Nitrofurantoin Itching  . Sulfamethoxazole W-Trimethoprim Itching    Family History  Problem Relation Age of Onset  . Hypertension Mother       Prior to Admission  medications   Medication Sig Start Date End Date Taking? Authorizing Provider  acetaminophen (TYLENOL) 500 MG tablet Take 1,000 mg by mouth every 6 (six) hours as needed for pain.   Yes Historical Provider, MD  aspirin 325 MG tablet Take 650 mg by mouth once.   Yes Historical Provider, MD  cholecalciferol (VITAMIN D) 1000 UNITS tablet Take 2,000 Units by mouth daily.   Yes Historical Provider, MD  Multiple Vitamin (MULTIVITAMIN WITH MINERALS) TABS Take 1 tablet by mouth daily.   Yes Historical Provider, MD  prednisoLONE acetate (PRED FORTE) 1 % ophthalmic suspension Place 1 drop into the left eye 2 (two) times daily.   Yes Historical Provider, MD   Physical Exam: Filed Vitals:   03/27/13 0415 03/27/13 0500 03/27/13 0515 03/27/13 0614  BP: 128/64 113/48 127/60 142/70  Pulse: 64 64 64 62  Temp:      TempSrc:      Resp: 17 20 20 16   SpO2: 98% 94% 97% 97%     General:  Well-developed well-nourished.  Eyes: Anicteric no pallor.  ENT: No discharge from the ears eyes nose and mouth.  Neck: No mass felt.  Cardiovascular: S1-S2 heard.  Respiratory: No rhonchi no crepitations.  Abdomen: Soft nontender bowel sounds present.  Skin: No rash.  Musculoskeletal: Pain on moving the right arm.  Psychiatric: Appears normal.  Neurologic: Alert awake oriented to time place and person. Moves all extremities.  Labs on Admission:  Basic Metabolic Panel:  Recent Labs Lab 03/27/13 0220  NA 140  K 3.9  CL 104  CO2 25  GLUCOSE 113*  BUN 19  CREATININE 0.69  CALCIUM 9.1   Liver Function Tests:  Recent Labs Lab 03/27/13 0220  AST 20  ALT 13  ALKPHOS 77  BILITOT 0.3  PROT 6.9  ALBUMIN 3.6   No results found for this basename: LIPASE, AMYLASE,  in the last 168 hours No results found for this basename: AMMONIA,  in the last 168 hours CBC:  Recent Labs Lab 03/27/13 0220  WBC 11.7*  HGB 13.0  HCT 37.2  MCV 90.1  PLT 252   Cardiac Enzymes: No results found for this  basename: CKTOTAL, CKMB, CKMBINDEX, TROPONINI,  in the last 168 hours  BNP (last 3 results) No results found for this basename: PROBNP,  in the last 8760 hours CBG: No results found for this basename: GLUCAP,  in the last 168 hours  Radiological Exams on Admission: Dg Shoulder Right  03/27/2013  *RADIOLOGY REPORT*  Clinical Data: Superior right shoulder pain after fall with loss of consciousness.  RIGHT SHOULDER - 2+ VIEW  Comparison: None.  Findings: Acute mildly displaced fracture of the distal aspect of the right clavicle with inferior displacement distal fracture fragment.  Acromioclavicular and coracoclavicular spaces are maintained.  Mild degenerative changes in the glenohumeral joint. Sub acromial spur formation.  No focal bone lesion or bone destruction.  IMPRESSION: Fracture of the distal right clavicle with inferior displacement distal fracture fragment.   Original Report Authenticated By: Burman Nieves, M.D.    Ct Head Wo Contrast  03/27/2013  *RADIOLOGY REPORT*  Clinical Data: Loss of consciousness.  CT HEAD WITHOUT CONTRAST  Technique:  Contiguous axial images were obtained from the base of the skull through the vertex without contrast.  Comparison: 09/30/2012  Findings: Postoperative changes consistent with left posterior parietal craniotomy.  Underlying encephalomalacia in the posterior aspect of the left posterior parietal lobe.  This appears stable since the previous study.  Diffuse cerebral atrophy.  Mild ventricular dilatation consistent with central atrophy.  No mass effect or midline shift.  No abnormal extra-axial fluid collections.  Gray-white matter junctions are distinct.  Basal cisterns are not effaced.  No evidence of acute intracranial hemorrhage.  No depressed skull fractures.  Visualized paranasal sinuses and mastoid air cells are not opacified.  Stable appearance since previous study.  IMPRESSION: No acute intracranial abnormalities.  Stable postoperative changes on the  left.  Chronic atrophy.   Original Report Authenticated By: Burman Nieves, M.D.     EKG: Independently reviewed. Normal sinus rhythm.  Assessment/Plan Principal Problem:   Syncope Active Problems:   Clavicular fracture   1. Syncope - cause is not clear. Monitor in telemetry for any arrhythmias. Check 2-D echo and cardiac enzymes. Check orthostatics. 2. Right clavicle fracture - on sling and pain relief medications. 3. History of hyperlipidemia on diet. 4. Previous history of subdural hematoma.    Code Status: Full code.  Family Communication: None.  Disposition Plan: Admit for observation.    Naman Spychalski N. [facility] Pager [phone].  If 7PM-7AM, please contact night-coverage [email or web address] Password [email or web address] 03/27/2013, 6:18 AM

## 2013-03-27 NOTE — Progress Notes (Signed)
PATIENT DETAILS Name: Claudia Perez Age: 62 y.o. Sex: female Date of Birth: 1951-03-19 Admit Date: 03/27/2013 Admitting Physician Eduard Clos, MD PCP:No primary provider on file.  Subjective: Admitted with a syncopal episode  Assessment/Plan: Principal Problem:   Syncope -continue with telemetry monitoring -await Echo -given h/o significant SDH-with residual speech deficits check a EEG-though unlikely to be seizures -check orthostatics in am -if w/u neg-then home 4/29  -ambulate with PT  Active Problems:   Clavicular fracture -supportive care with a sling  Disposition: Remain inpatient  DVT Prophylaxis:  SCD's  Code Status: Full code   Family Communication None at bedside  Procedures:  None  CONSULTS:  None   MEDICATIONS: Scheduled Meds: . prednisoLONE acetate  1 drop Left Eye BID  . sodium chloride  3 mL Intravenous Q12H   Continuous Infusions: . sodium chloride 75 mL/hr at 03/27/13 0833   PRN Meds:.acetaminophen, acetaminophen, ondansetron (ZOFRAN) IV, ondansetron  Antibiotics: Anti-infectives   None       PHYSICAL EXAM: Vital signs in last 24 hours: Filed Vitals:   03/27/13 0903 03/27/13 0906 03/27/13 0909 03/27/13 1212  BP: 122/72 117/78 124/72 111/68  Pulse: 66 76 76 72  Temp:    98 F (36.7 C)  TempSrc:    Oral  Resp:    18  SpO2:    98%    Weight change:  There were no vitals filed for this visit. There is no height or weight on file to calculate BMI.   Gen Exam: Awake and alert Neck: Supple, No JVD.   Chest: B/L Clear.   CVS: S1 S2 Regular, no murmurs.  Abdomen: soft, BS +, non tender, non distended.  Extremities: no edema, lower extremities warm to touch. Neurologic: Non Focal.   Skin: No Rash.   Wounds: N/A.    Intake/Output from previous day: No intake or output data in the 24 hours ending 03/27/13 1351   LAB RESULTS: CBC  Recent Labs Lab 03/27/13 0220  WBC 11.7*  HGB 13.0  HCT 37.2  PLT 252  MCV  90.1  MCH 31.5  MCHC 34.9  RDW 12.8    Chemistries   Recent Labs Lab 03/27/13 0220  NA 140  K 3.9  CL 104  CO2 25  GLUCOSE 113*  BUN 19  CREATININE 0.69  CALCIUM 9.1    CBG: No results found for this basename: GLUCAP,  in the last 168 hours  GFR CrCl is unknown because there is no height on file for the current visit.  Coagulation profile No results found for this basename: INR, PROTIME,  in the last 168 hours  Cardiac Enzymes  Recent Labs Lab 03/27/13 0910  TROPONINI <0.30    No components found with this basename: POCBNP,  No results found for this basename: DDIMER,  in the last 72 hours No results found for this basename: HGBA1C,  in the last 72 hours No results found for this basename: CHOL, HDL, LDLCALC, TRIG, CHOLHDL, LDLDIRECT,  in the last 72 hours No results found for this basename: TSH, T4TOTAL, FREET3, T3FREE, THYROIDAB,  in the last 72 hours No results found for this basename: VITAMINB12, FOLATE, FERRITIN, TIBC, IRON, RETICCTPCT,  in the last 72 hours No results found for this basename: LIPASE, AMYLASE,  in the last 72 hours  Urine Studies No results found for this basename: UACOL, UAPR, USPG, UPH, UTP, UGL, UKET, UBIL, UHGB, UNIT, UROB, ULEU, UEPI, UWBC, URBC, UBAC, CAST, CRYS, UCOM, BILUA,  in the last 72  hours  MICROBIOLOGY: No results found for this or any previous visit (from the past 240 hour(s)).  RADIOLOGY STUDIES/RESULTS: Dg Shoulder Right  03/27/2013  *RADIOLOGY REPORT*  Clinical Data: Superior right shoulder pain after fall with loss of consciousness.  RIGHT SHOULDER - 2+ VIEW  Comparison: None.  Findings: Acute mildly displaced fracture of the distal aspect of the right clavicle with inferior displacement distal fracture fragment.  Acromioclavicular and coracoclavicular spaces are maintained.  Mild degenerative changes in the glenohumeral joint. Sub acromial spur formation.  No focal bone lesion or bone destruction.  IMPRESSION: Fracture  of the distal right clavicle with inferior displacement distal fracture fragment.   Original Report Authenticated By: Burman Nieves, M.D.    Ct Head Wo Contrast  03/27/2013  *RADIOLOGY REPORT*  Clinical Data: Loss of consciousness.  CT HEAD WITHOUT CONTRAST  Technique:  Contiguous axial images were obtained from the base of the skull through the vertex without contrast.  Comparison: 09/30/2012  Findings: Postoperative changes consistent with left posterior parietal craniotomy.  Underlying encephalomalacia in the posterior aspect of the left posterior parietal lobe.  This appears stable since the previous study.  Diffuse cerebral atrophy.  Mild ventricular dilatation consistent with central atrophy.  No mass effect or midline shift.  No abnormal extra-axial fluid collections.  Gray-white matter junctions are distinct.  Basal cisterns are not effaced.  No evidence of acute intracranial hemorrhage.  No depressed skull fractures.  Visualized paranasal sinuses and mastoid air cells are not opacified.  Stable appearance since previous study.  IMPRESSION: No acute intracranial abnormalities.  Stable postoperative changes on the left.  Chronic atrophy.   Original Report Authenticated By: Burman Nieves, M.D.    Gna Rad Results  03/24/2013  Ordered by an unspecified provider.    Jeoffrey Massed, MD  [facility] Pager:[phone]  If 7PM-7AM, please contact night-coverage [email or web address] Password [email or web address] 03/27/2013, 1:51 PM   LOS: 0 days

## 2013-03-27 NOTE — ED Notes (Signed)
Lab in room to obtain blood work

## 2013-03-27 NOTE — ED Provider Notes (Signed)
History     CSN: [id number]  Arrival date & time 03/26/13  2208   First MD Initiated Contact with Patient 03/27/13 [phone]      Chief Complaint  Patient presents with  . Loss of Consciousness   HPI Claudia Perez is a 62 y.o. female with a history of subdural hemorrhage requiring drainage in 2004 was also had some other falls presents with syncopal episode and fall.  She says her movement and balance have been affected since her subdural hemorrhage, and says she's been unsteady on her feet. Tonight she was in her room getting ready for bed, did not have any vagal prodrome, remembers waking up on the floor and has right shoulder pain.  Patient's symptoms of loss of consciousness were transient, she's not had them before, they were severe. Patient's shoulder pain is located at the distal clavicle, it is moderate, worse on flexing or abduction the arm.     Past Medical History  Diagnosis Date  . Mobility impaired   . Hypercholesteremia   . Vitamin D deficiency     Past Surgical History  Procedure Laterality Date  . Subdural hemorrhage drainage    . Appendectomy      History reviewed. No pertinent family history.  History  Substance Use Topics  . Smoking status: Never Smoker   . Smokeless tobacco: Not on file  . Alcohol Use: No    OB History   Grav Para Term Preterm Abortions TAB SAB Ect Mult Living                  Review of Systems At least 10pt or greater review of systems completed and are negative except where specified in the HPI.  Allergies  Nitrofurantoin and Sulfamethoxazole w-trimethoprim  Home Medications   Current Outpatient Rx  Name  Route  Sig  Dispense  Refill  . acetaminophen (TYLENOL) 500 MG tablet   Oral   Take 1,000 mg by mouth every 6 (six) hours as needed for pain.         Marland Kitchen aspirin 325 MG tablet   Oral   Take 650 mg by mouth once.         . cholecalciferol (VITAMIN D) 1000 UNITS tablet   Oral   Take 2,000 Units by mouth daily.          . Multiple Vitamin (MULTIVITAMIN WITH MINERALS) TABS   Oral   Take 1 tablet by mouth daily.         . prednisoLONE acetate (PRED FORTE) 1 % ophthalmic suspension   Left Eye   Place 1 drop into the left eye 2 (two) times daily.           BP 134/71  Pulse 69  Temp(Src) 98.1 F (36.7 C) (Oral)  Resp 17  SpO2 98%  Physical Exam  Nursing notes reviewed.  Electronic medical record reviewed. VITAL SIGNS:   Filed Vitals:   03/27/13 1626 03/27/13 1643 03/27/13 2000 03/28/13 0000  BP: 133/69  150/75 136/73  Pulse: 63  61 56  Temp: 98.4 F (36.9 C)  98.4 F (36.9 C) 98.1 F (36.7 C)  TempSrc:      Resp: 18  18 18   Height: 5\' 2"  (1.575 m)     Weight:  119 lb 3.2 oz (54.069 kg)    SpO2: 97%  95% 98%   CONSTITUTIONAL: Awake, oriented, appears non-toxic HENT: Atraumatic, normocephalic, oral mucosa pink and moist, airway patent. Nares patent without drainage. External ears normal.  EYES: Conjunctiva clear, EOMI, PERRLA NECK: Trachea midline, non-tender, supple CARDIOVASCULAR: Normal heart rate, Normal rhythm, No murmurs, rubs, gallops PULMONARY/CHEST: Clear to auscultation, no rhonchi, wheezes, or rales. Symmetrical breath sounds. Non-tender. ABDOMINAL: Non-distended, soft, non-tender - no rebound or guarding.  BS normal. NEUROLOGIC: Non-focal, moving all four extremities, no gross sensory or motor deficits. EXTREMITIES: No clubbing, cyanosis, or edema SKIN: Warm, Dry, No erythema, No rash  ED Course  Procedures (including critical care time)  Date: 03/28/2013  Rate: 72  Rhythm: normal sinus rhythm  QRS Axis: normal  Intervals: normal  ST/T Wave abnormalities: Nonspecific T-wave flattening most evident in the lateral precordial leads  Conduction Disutrbances: none  Narrative Interpretation: Nonspecific T-wave flattening, this is new from prior EKG   Labs Reviewed  CBC - Abnormal; Notable for the following:    WBC 11.7 (*)    All other components within normal limits   COMPREHENSIVE METABOLIC PANEL   Dg Shoulder Right  03/27/2013  *RADIOLOGY REPORT*  Clinical Data: Superior right shoulder pain after fall with loss of consciousness.  RIGHT SHOULDER - 2+ VIEW  Comparison: None.  Findings: Acute mildly displaced fracture of the distal aspect of the right clavicle with inferior displacement distal fracture fragment.  Acromioclavicular and coracoclavicular spaces are maintained.  Mild degenerative changes in the glenohumeral joint. Sub acromial spur formation.  No focal bone lesion or bone destruction.  IMPRESSION: Fracture of the distal right clavicle with inferior displacement distal fracture fragment.   Original Report Authenticated By: Burman Nieves, M.D.    Ct Head Wo Contrast  03/27/2013  *RADIOLOGY REPORT*  Clinical Data: Loss of consciousness.  CT HEAD WITHOUT CONTRAST  Technique:  Contiguous axial images were obtained from the base of the skull through the vertex without contrast.  Comparison: 09/30/2012  Findings: Postoperative changes consistent with left posterior parietal craniotomy.  Underlying encephalomalacia in the posterior aspect of the left posterior parietal lobe.  This appears stable since the previous study.  Diffuse cerebral atrophy.  Mild ventricular dilatation consistent with central atrophy.  No mass effect or midline shift.  No abnormal extra-axial fluid collections.  Gray-white matter junctions are distinct.  Basal cisterns are not effaced.  No evidence of acute intracranial hemorrhage.  No depressed skull fractures.  Visualized paranasal sinuses and mastoid air cells are not opacified.  Stable appearance since previous study.  IMPRESSION: No acute intracranial abnormalities.  Stable postoperative changes on the left.  Chronic atrophy.   Original Report Authenticated By: Burman Nieves, M.D.      1. Syncope   2. Fracture, clavicle, right, closed, initial encounter   3. Clavicular fracture, right, closed, initial encounter       MDM   Patient had syncopal episode, negative workup so far, EKG, troponin are all negative, no evidence for infection, CT is within normal limits.  Patient does have a fracture of the distal clavicle, will treat with sling. Patient is stable at this time for admission  Patient to be admitted for syncopal workup.        Jones Skene, MD 03/28/13 [phone]

## 2013-03-27 NOTE — Procedures (Signed)
EEG report.  Brief clinical history:  62 years old female admitted with a syncopal episode Differential diagnosis is syncope versus seizures. No prior history of frank epileptic seizures.  Technique: this is a 17 channel routine scalp EEG performed at the bedside with bipolar and monopolar montages arranged in accordance to the international 10/20 system of electrode placement. One channel was dedicated to EKG recording.  The study was performed during wakefulness, drowsiness, and stage 2 sleep. No activating procedures employed during the test.  Description:In the wakeful state, the best background consisted of a medium amplitude, posterior dominant, well sustained, symmetric and reactive 10 Hz rhythm. Drowsiness demonstrated dropout of the alpha rhythm. Stage 2 sleep showed symmetric and synchronous sleep spindles without intermixed epileptiform discharges. No focal or generalized epileptiform discharges noted.  No slowing seen.  EKG showed sinus rhythm.  Impression: this is a normal awake and asleep EEG. Please, be aware that a normal EEG does not exclude the possibility of epilepsy.  Clinical correlation is advised.  Wyatt Portela, MD

## 2013-03-28 DIAGNOSIS — R55 Syncope and collapse: Secondary | ICD-10-CM | POA: Diagnosis not present

## 2013-03-28 DIAGNOSIS — IMO0001 Reserved for inherently not codable concepts without codable children: Secondary | ICD-10-CM | POA: Diagnosis not present

## 2013-03-28 LAB — BASIC METABOLIC PANEL
Chloride: 109 mEq/L (ref 96–112)
GFR calc non Af Amer: >90 mL/min (ref >90)
Glucose, Bld: 100 mg/dL — ABNORMAL HIGH (ref 70–99)
Potassium: 3.7 mEq/L (ref 3.5–5.1)
Sodium: 141 mEq/L (ref 135–145)

## 2013-03-28 LAB — CBC
HCT: 36.7 % (ref 36.0–46.0)
Hemoglobin: 12.7 g/dL (ref 12.0–15.0)
MCH: 31.4 pg (ref 26.0–34.0)
RBC: 4.04 MIL/uL (ref 3.87–5.11)

## 2013-03-28 NOTE — Progress Notes (Signed)
Utilization review completed.  

## 2013-03-28 NOTE — Care Management Note (Signed)
    Page 1 of 2   03/28/2013     12:30:51 PM   CARE MANAGEMENT NOTE 03/28/2013  Patient:  Claudia Perez, Claudia Perez   Account Number:  [id number]  Date Initiated:  03/28/2013  Documentation initiated by:  Donn Pierini  Subjective/Objective Assessment:   Pt admittd with syncope     Action/Plan:   PTA pt lived at home with spouse-  PT eval   Anticipated DC Date:  03/28/2013   Anticipated DC Plan:  HOME W HOME HEALTH SERVICES      DC Planning Services  CM consult      [facility] Choice  DURABLE MEDICAL EQUIPMENT  HOME HEALTH   Choice offered to / List presented to:  C-1 Patient   DME arranged  Levan Hurst      DME agency  [facility]     HH arranged  HH-2 PT  HH-3 OT      [facility] agency  [facility]   Status of service:  Completed, signed off Medicare Important Message given?   (If response is "NO", the following Medicare IM given date fields will be blank) Date Medicare IM given:   Date Additional Medicare IM given:    Discharge Disposition:  HOME W HOME HEALTH SERVICES  Per UR Regulation:  Reviewed for med. necessity/level of care/duration of stay  If discussed at Long Length of Stay Meetings, dates discussed:    Comments:  03/28/13- 1200- Donn Pierini RN, BSN [phone] Pt for d/c today - orders for HH-PT/OT- in to speak with pt and spouse at bedside- per conversation with pt she is agreeable to [facility] services- list of [place] agencies given to pt/spouse- per choice they would like to use [facility] for [facility] services- referral called to Lupita Leash with [facility] for HH-PT/OT- services to begin within 24-48 post discharge- pt also needs RW for home and is agreeable to this- order placed and call made to Derrian with [facility] regarding DME need. RW to be delivered to room prior to discharge

## 2013-03-28 NOTE — Evaluation (Signed)
Physical Therapy Evaluation Patient Details Name: Claudia Perez MRN: [id number] DOB: 1951/03/05 Today's Date: 03/28/2013 Time: 4098-1191 PT Time Calculation (min): 27 min  PT Assessment / Plan / Recommendation Clinical Impression  pt presents with multiple falls, Syncopal episodes.  pt with R clavicle fx from recent fall.  No oreders written about sling use or WBing, however educated pt on positioning of sling.  Noted plan for pt to D/C today.  pt states her family can be with her 24hrs and due to pt's decreased safety and hx of falls, would benefit from having 24hr A and HHPT.      PT Assessment  All further PT needs can be met in the next venue of care    Follow Up Recommendations  Home health PT;Supervision/Assistance - 24 hour    Does the patient have the potential to tolerate intense rehabilitation      Barriers to Discharge        Equipment Recommendations  None recommended by PT    Recommendations for Other Services     Frequency      Precautions / Restrictions Precautions Precautions: Fall Restrictions Weight Bearing Restrictions: No   Pertinent Vitals/Pain Indicates chronic R hip/knee pain and R shoulder pain.  Premedicated.        Mobility  Bed Mobility Bed Mobility: Supine to Sit;Sitting - Scoot to Edge of Bed Supine to Sit: 5: Supervision;With rails;HOB elevated Sitting - Scoot to Edge of Bed: 5: Supervision Details for Bed Mobility Assistance: cues for encouragement and needs to use rail.   Transfers Transfers: Sit to Stand;Stand to Sit Sit to Stand: 4: Min assist;With upper extremity assist;From bed;From toilet Stand to Sit: 4: Min guard;With upper extremity assist;To toilet;To chair/3-in-1 Details for Transfer Assistance: cues for use of L UE and not to use R UE.  pt mildly unsteady and relies on UE A.   Ambulation/Gait Ambulation/Gait Assistance: 4: Min assist Ambulation Distance (Feet): 35 Feet Assistive device: None Ambulation/Gait Assistance  Details: pt generally unsteady with moderate lateral sway Bil.  pt notes this is near baseline gait.   Gait Pattern: Step-through pattern;Decreased stride length;Lateral trunk lean to right;Lateral trunk lean to left Stairs: No Wheelchair Mobility Wheelchair Mobility: No    Exercises     PT Diagnosis:    PT Problem List:   PT Treatment Interventions:     PT Goals    Visit Information  Last PT Received On: 03/28/13 Assistance Needed: +1    Subjective Data  Subjective: I fall a lot.   Patient Stated Goal: Home   Prior Functioning  Home Living Lives With: Spouse;Family Available Help at Discharge: Family;Available 24 hours/day (pt states family can be 24hrs.  ) Type of Home: House Home Access: Stairs to enter Home Layout: Two level;1/2 bath on main level Alternate Level Stairs-Number of Steps: flight Alternate Level Stairs-Rails: Right Bathroom Shower/Tub: Health visitor: Standard Home Adaptive Equipment: Walker - rolling Prior Function Level of Independence: Needs assistance Needs Assistance: Light Housekeeping Light Housekeeping: Minimal Able to Take Stairs?: Yes Driving: No Vocation: Unemployed Communication Communication: No difficulties    Cognition  Cognition Arousal/Alertness: Awake/alert Behavior During Therapy: WFL for tasks assessed/performed Overall Cognitive Status: Within Functional Limits for tasks assessed    Extremity/Trunk Assessment Right Lower Extremity Assessment RLE ROM/Strength/Tone: Deficits RLE ROM/Strength/Tone Deficits: Generally weak with pain in R hip and knee.   Left Lower Extremity Assessment LLE ROM/Strength/Tone: Deficits LLE ROM/Strength/Tone Deficits: Generally weak and deconditioned.   Trunk Assessment Trunk Assessment: Normal  Balance Balance Balance Assessed: Yes Static Standing Balance Static Standing - Balance Support: No upper extremity supported;During functional activity Static Standing - Level of  Assistance: 5: Stand by assistance Static Standing - Comment/# of Minutes: pt leans against sink during hand hygiene.    End of Session PT - End of Session Equipment Utilized During Treatment: Gait belt Activity Tolerance: Patient limited by fatigue;Patient limited by pain Patient left: in chair;with call bell/phone within reach;with chair alarm set Nurse Communication: Mobility status  GP Functional Assessment Tool Used: Clinical Judgement Functional Limitation: Mobility: Walking and moving around Mobility: Walking and Moving Around Current Status (U9811): At least 1 percent but less than 20 percent impaired, limited or restricted Mobility: Walking and Moving Around Goal Status [phone]): At least 1 percent but less than 20 percent impaired, limited or restricted Mobility: Walking and Moving Around Discharge Status [phone]): At least 1 percent but less than 20 percent impaired, limited or restricted   Sunny Schlein, [place] [phone] 03/28/2013, 1:57 PM

## 2013-03-28 NOTE — Progress Notes (Signed)
Pt provided with dc isntructions and education. Pt verbalized understanding. [facility] set up with [facility] and provided with rolling walker. No questions at this time. Levonne Spiller, Rn

## 2013-03-28 NOTE — Discharge Summary (Signed)
PATIENT DETAILS Name: Claudia Perez Age: 62 y.o. Sex: female Date of Birth: Aug 30, 1951 MRN: [id number]. Admit Date: 03/27/2013 Admitting Physician: Eduard Clos, MD WUJ:[id number] [name], MD  Recommendations for Outpatient Follow-up:  1. Needs follow up with Orthopedics-Dr Duda-as noted below 2. General Health Maintenance 3. If syncope recurs-needs Holter or Event Monitor  PRIMARY DISCHARGE DIAGNOSIS:  Principal Problem:   Syncope Active Problems:   Clavicular fracture      PAST MEDICAL HISTORY: Past Medical History  Diagnosis Date  . Mobility impaired   . Hypercholesteremia   . Vitamin D deficiency   . Shortness of breath     "even now, just talking" (03/27/2013)  . Hyperthyroidism     "a little bit; don't take RX for it" (03/27/2013)  . GERD (gastroesophageal reflux disease)   . Seizures     "think I might have; been blacking out several times in the past" (03/27/2013)  . Fall at home 03/27/2013    "lost consciousness; fractured right clavicle" (03/27/2013)    DISCHARGE MEDICATIONS:   Medication List    TAKE these medications       acetaminophen 500 MG tablet  Commonly known as:  TYLENOL  Take 1,000 mg by mouth every 6 (six) hours as needed for pain.     aspirin 325 MG tablet  Take 650 mg by mouth once.     cholecalciferol 1000 UNITS tablet  Commonly known as:  VITAMIN D  Take 2,000 Units by mouth daily.     multivitamin with minerals Tabs  Take 1 tablet by mouth daily.     prednisoLONE acetate 1 % ophthalmic suspension  Commonly known as:  PRED FORTE  Place 1 drop into the left eye 2 (two) times daily.         BRIEF HPI:  See H&P, Labs, Consult and Test reports for all details in brief, patient was admitted for a syncopal episode.  CONSULTATIONS:   None  PERTINENT RADIOLOGIC STUDIES: Dg Shoulder Right  03/27/2013  *RADIOLOGY REPORT*  Clinical Data: Superior right shoulder pain after fall with loss of consciousness.  RIGHT SHOULDER -  2+ VIEW  Comparison: None.  Findings: Acute mildly displaced fracture of the distal aspect of the right clavicle with inferior displacement distal fracture fragment.  Acromioclavicular and coracoclavicular spaces are maintained.  Mild degenerative changes in the glenohumeral joint. Sub acromial spur formation.  No focal bone lesion or bone destruction.  IMPRESSION: Fracture of the distal right clavicle with inferior displacement distal fracture fragment.   Original Report Authenticated By: Burman Nieves, M.D.    Ct Head Wo Contrast  03/27/2013  *RADIOLOGY REPORT*  Clinical Data: Loss of consciousness.  CT HEAD WITHOUT CONTRAST  Technique:  Contiguous axial images were obtained from the base of the skull through the vertex without contrast.  Comparison: 09/30/2012  Findings: Postoperative changes consistent with left posterior parietal craniotomy.  Underlying encephalomalacia in the posterior aspect of the left posterior parietal lobe.  This appears stable since the previous study.  Diffuse cerebral atrophy.  Mild ventricular dilatation consistent with central atrophy.  No mass effect or midline shift.  No abnormal extra-axial fluid collections.  Gray-white matter junctions are distinct.  Basal cisterns are not effaced.  No evidence of acute intracranial hemorrhage.  No depressed skull fractures.  Visualized paranasal sinuses and mastoid air cells are not opacified.  Stable appearance since previous study.  IMPRESSION: No acute intracranial abnormalities.  Stable postoperative changes on the left.  Chronic atrophy.   Original Report  Authenticated By: Burman Nieves, M.D.    Gna Rad Results  03/24/2013  Ordered by an unspecified provider.     PERTINENT LAB RESULTS: CBC:  Recent Labs  03/27/13 0220 03/28/13 0507  WBC 11.7* 7.7  HGB 13.0 12.7  HCT 37.2 36.7  PLT 252 251   CMET CMP     Component Value Date/Time   NA 141 03/28/2013 0507   K 3.7 03/28/2013 0507   CL 109 03/28/2013 0507   CO2 25  03/28/2013 0507   GLUCOSE 100* 03/28/2013 0507   BUN 7 03/28/2013 0507   CREATININE 0.58 03/28/2013 0507   CALCIUM 8.6 03/28/2013 0507   PROT 6.9 03/27/2013 0220   ALBUMIN 3.6 03/27/2013 0220   AST 20 03/27/2013 0220   ALT 13 03/27/2013 0220   ALKPHOS 77 03/27/2013 0220   BILITOT 0.3 03/27/2013 0220   GFRNONAA >90 03/28/2013 0507   GFRAA >90 03/28/2013 0507    GFR Estimated Creatinine Clearance: 58.4 ml/min (by C-G formula based on Cr of 0.58). No results found for this basename: LIPASE, AMYLASE,  in the last 72 hours  Recent Labs  03/27/13 0910 03/27/13 1643 03/27/13 1956  TROPONINI <0.30 <0.30 <0.30   No components found with this basename: POCBNP,  No results found for this basename: DDIMER,  in the last 72 hours No results found for this basename: HGBA1C,  in the last 72 hours No results found for this basename: CHOL, HDL, LDLCALC, TRIG, CHOLHDL, LDLDIRECT,  in the last 72 hours No results found for this basename: TSH, T4TOTAL, FREET3, T3FREE, THYROIDAB,  in the last 72 hours No results found for this basename: VITAMINB12, FOLATE, FERRITIN, TIBC, IRON, RETICCTPCT,  in the last 72 hours Coags: No results found for this basename: PT, INR,  in the last 72 hours Microbiology: No results found for this or any previous visit (from the past 240 hour(s)).   BRIEF HOSPITAL COURSE:   Principal Problem:   Syncope -Admitted to telemetry unit, no arrythmia's seen during her stay here. 2 D Echo showed preserved EF. Given her h/o Subdural Hematoma-EEG was done-which was negative for any seizure like activity. Her clinical presentation, was not consistent with seizures either.  -CT head on admission was negative as well -Since work up is negative, at this point she is being discharged home. If she were to have recurrent syncopal episode she very well may need a Event monitor or a prolonged EEG with Cards and Neuro consultation  Active Problems:   Clavicular fracture -this likely occurred with  the syncopal episode, case was discussed with Dr Lajoyce Corners over the phone, who recommended to continue with the Sling. She has a follow up appointment with Dr Lauralyn Primes below.   TODAY-DAY OF DISCHARGE:  Subjective:   Charlott Golab today has no headache,no chest abdominal pain,no new weakness tingling or numbness, feels much better wants to go home today.   Objective:   Blood pressure 124/78, pulse 62, temperature 98.4 F (36.9 C), temperature source Oral, resp. rate 18, height 5\' 2"  (1.575 m), weight 54.069 kg (119 lb 3.2 oz), SpO2 100.00%.  Intake/Output Summary (Last 24 hours) at 03/28/13 1033 Last data filed at 03/28/13 0844  Gross per 24 hour  Intake    360 ml  Output      0 ml  Net    360 ml   Filed Weights   03/27/13 1643  Weight: 54.069 kg (119 lb 3.2 oz)    Exam Awake Alert, Oriented *3, No new F.N  deficits, Normal affect [place].AT,PERRAL Supple Neck,No JVD, No cervical lymphadenopathy appriciated.  Symmetrical Chest wall movement, Good air movement bilaterally, CTAB RRR,No Gallops,Rubs or new Murmurs, No Parasternal Heave +ve B.Sounds, Abd Soft, Non tender, No organomegaly appriciated, No rebound -guarding or rigidity. No Cyanosis, Clubbing or edema, No new Rash or bruise  DISCHARGE CONDITION: Stable  DISPOSITION: Home with Home health services. Patient claims her husband is with her 24/7.   DISCHARGE INSTRUCTIONS:    Activity:  As tolerated with Full fall precautions use walker/cane & assistance as needed  Diet recommendation: Heart Healthy diet        Discharge Orders   Future Appointments Provider Department Dept Phone   05/04/2013 2:30 PM Huston Foley, MD [facility] [phone]   Future Orders Complete By Expires     Call MD for:  persistant dizziness or light-headedness  As directed     Call MD for:  severe uncontrolled pain  As directed     Diet - low sodium heart healthy  As directed     Increase activity slowly  As directed         Follow-up Information   Follow up with Gaye Alken, MD. Schedule an appointment as soon as possible for a visit in 1 week.   Contact information:   [address] [place] [address] [phone]       Follow up with Nadara Mustard, MD On 04/03/2013. (APPOINTMENT AT 10 :30 AM)    Contact information:   [address] [place] [address] [phone]      Total Time spent on discharge equals 45 minutes.  SignedJeoffrey Massed 03/28/2013 10:33 AM

## 2013-03-28 NOTE — Progress Notes (Signed)
HOME HEALTH AGENCIES SERVING [place]   Agencies that are Medicare-Certified and are affiliated with [facility]  Telephone Number Address  [facility]   [facility] has ownership interest in this company; however, you are under no obligation to use this agency. [phone] or  [phone] [address] [place], [place] [address] [email or web address]   Agencies that are Medicare-Certified and are not affiliated with [facility]                                                                                 Home Health Agency Telephone Number Address  [facility] [phone] Fax [phone] [address] [place], [place]  [address] [email or web address]  [facility] [phone] or [phone] Fax [phone] [address] [place], [place] [address] [email or web address]  [facility] [phone] Fax [phone] [address] [place], [place] [address] [email or web address]  [facility] [phone] Fax [phone] [address] [place], [place]  [address] [email or web address]  [facility] [phone] Fax [phone] [address] [place], [place] [address] [email or web address]  [facility] [phone] [address] [place], [place] [address] [email or web address]  [facility] [phone]  [address] [place], [place] [address] [email or web address]  [facility] [phone] or [phone] Fax number [phone] [address] [place], [place]  [address] [email or web address]  [facility] [phone] Fax [phone] [address] [place], [place]  [address]  [facility]  [phone] Fax [phone] [address] [place], [place] [address] [email or web address]       Agencies that are not Medicare-Certified and are not affiliated with [facility]   Home Health Agency Telephone Number Address  [facility] [phone] or [phone] Fax [phone] [address] [place], [place]  [address] [email or web address]  [facility] [phone] Fax [phone] [address] [place], [place]  [address] [email or web address]  [facility] [phone] Fax [phone] [address] [place], [place]  [address] [email or web address]  [facility]  [phone] Fax [phone] [address] [place], [place] [address] http://[email or web address]  [facility] [phone] Fax [phone] [address] [place], [place] [address]  [facility] [phone] or [phone] Fax [phone] [address] [place], [place]  [address] http://[email or web address]  [facility] [phone] or [phone] Fax [phone] [address] [place], [place]  [address] [email or web address]  [facility] [phone] Fax [phone] [address] [place], [place]  [address] [email or web address]  [facility] [phone] [address] [place], [place]  [address]  [facility] [phone] Fax [phone] [address] [place], [place]  [address]  [facility] [phone] Fax [phone] [address] [place], [place]  [address] [email or web address]  [facility] [phone] Fax [phone] [address] [place], [place] [address] [email or web address]  [facility] [phone] Fax [phone] [address] [place],     [address] [email or web address]   

## 2013-03-30 DIAGNOSIS — IMO0001 Reserved for inherently not codable concepts without codable children: Secondary | ICD-10-CM | POA: Diagnosis not present

## 2013-03-30 DIAGNOSIS — S42009A Fracture of unspecified part of unspecified clavicle, initial encounter for closed fracture: Secondary | ICD-10-CM | POA: Diagnosis not present

## 2013-03-30 DIAGNOSIS — G8921 Chronic pain due to trauma: Secondary | ICD-10-CM | POA: Diagnosis not present

## 2013-03-30 DIAGNOSIS — Z9181 History of falling: Secondary | ICD-10-CM | POA: Diagnosis not present

## 2013-04-01 DIAGNOSIS — S42023A Displaced fracture of shaft of unspecified clavicle, initial encounter for closed fracture: Secondary | ICD-10-CM | POA: Diagnosis not present

## 2013-04-03 DIAGNOSIS — Z9181 History of falling: Secondary | ICD-10-CM | POA: Diagnosis not present

## 2013-04-03 DIAGNOSIS — S42009A Fracture of unspecified part of unspecified clavicle, initial encounter for closed fracture: Secondary | ICD-10-CM | POA: Diagnosis not present

## 2013-04-03 DIAGNOSIS — G8921 Chronic pain due to trauma: Secondary | ICD-10-CM | POA: Diagnosis not present

## 2013-04-03 DIAGNOSIS — IMO0001 Reserved for inherently not codable concepts without codable children: Secondary | ICD-10-CM | POA: Diagnosis not present

## 2013-04-05 DIAGNOSIS — S42009A Fracture of unspecified part of unspecified clavicle, initial encounter for closed fracture: Secondary | ICD-10-CM | POA: Diagnosis not present

## 2013-04-05 DIAGNOSIS — Z9181 History of falling: Secondary | ICD-10-CM | POA: Diagnosis not present

## 2013-04-05 DIAGNOSIS — IMO0001 Reserved for inherently not codable concepts without codable children: Secondary | ICD-10-CM | POA: Diagnosis not present

## 2013-04-05 DIAGNOSIS — G8921 Chronic pain due to trauma: Secondary | ICD-10-CM | POA: Diagnosis not present

## 2013-04-10 DIAGNOSIS — S42009A Fracture of unspecified part of unspecified clavicle, initial encounter for closed fracture: Secondary | ICD-10-CM | POA: Diagnosis not present

## 2013-04-10 DIAGNOSIS — Z9181 History of falling: Secondary | ICD-10-CM | POA: Diagnosis not present

## 2013-04-10 DIAGNOSIS — IMO0001 Reserved for inherently not codable concepts without codable children: Secondary | ICD-10-CM | POA: Diagnosis not present

## 2013-04-10 DIAGNOSIS — G8921 Chronic pain due to trauma: Secondary | ICD-10-CM | POA: Diagnosis not present

## 2013-04-11 DIAGNOSIS — G8921 Chronic pain due to trauma: Secondary | ICD-10-CM | POA: Diagnosis not present

## 2013-04-11 DIAGNOSIS — IMO0001 Reserved for inherently not codable concepts without codable children: Secondary | ICD-10-CM | POA: Diagnosis not present

## 2013-04-11 DIAGNOSIS — S42009A Fracture of unspecified part of unspecified clavicle, initial encounter for closed fracture: Secondary | ICD-10-CM | POA: Diagnosis not present

## 2013-04-11 DIAGNOSIS — Z9181 History of falling: Secondary | ICD-10-CM | POA: Diagnosis not present

## 2013-04-12 DIAGNOSIS — G8921 Chronic pain due to trauma: Secondary | ICD-10-CM | POA: Diagnosis not present

## 2013-04-12 DIAGNOSIS — Z9181 History of falling: Secondary | ICD-10-CM | POA: Diagnosis not present

## 2013-04-12 DIAGNOSIS — S42009A Fracture of unspecified part of unspecified clavicle, initial encounter for closed fracture: Secondary | ICD-10-CM | POA: Diagnosis not present

## 2013-04-12 DIAGNOSIS — IMO0001 Reserved for inherently not codable concepts without codable children: Secondary | ICD-10-CM | POA: Diagnosis not present

## 2013-04-13 DIAGNOSIS — IMO0001 Reserved for inherently not codable concepts without codable children: Secondary | ICD-10-CM | POA: Diagnosis not present

## 2013-04-13 DIAGNOSIS — Z9181 History of falling: Secondary | ICD-10-CM | POA: Diagnosis not present

## 2013-04-13 DIAGNOSIS — G8921 Chronic pain due to trauma: Secondary | ICD-10-CM | POA: Diagnosis not present

## 2013-04-13 DIAGNOSIS — S42009A Fracture of unspecified part of unspecified clavicle, initial encounter for closed fracture: Secondary | ICD-10-CM | POA: Diagnosis not present

## 2013-04-18 DIAGNOSIS — Z9181 History of falling: Secondary | ICD-10-CM | POA: Diagnosis not present

## 2013-04-18 DIAGNOSIS — IMO0001 Reserved for inherently not codable concepts without codable children: Secondary | ICD-10-CM | POA: Diagnosis not present

## 2013-04-18 DIAGNOSIS — S42009A Fracture of unspecified part of unspecified clavicle, initial encounter for closed fracture: Secondary | ICD-10-CM | POA: Diagnosis not present

## 2013-04-18 DIAGNOSIS — G8921 Chronic pain due to trauma: Secondary | ICD-10-CM | POA: Diagnosis not present

## 2013-04-20 DIAGNOSIS — S42009A Fracture of unspecified part of unspecified clavicle, initial encounter for closed fracture: Secondary | ICD-10-CM | POA: Diagnosis not present

## 2013-04-20 DIAGNOSIS — G8921 Chronic pain due to trauma: Secondary | ICD-10-CM | POA: Diagnosis not present

## 2013-04-20 DIAGNOSIS — IMO0001 Reserved for inherently not codable concepts without codable children: Secondary | ICD-10-CM | POA: Diagnosis not present

## 2013-04-20 DIAGNOSIS — Z9181 History of falling: Secondary | ICD-10-CM | POA: Diagnosis not present

## 2013-05-01 ENCOUNTER — Telehealth: Payer: Self-pay | Admitting: Neurology

## 2013-05-01 DIAGNOSIS — S42023A Displaced fracture of shaft of unspecified clavicle, initial encounter for closed fracture: Secondary | ICD-10-CM | POA: Diagnosis not present

## 2013-05-01 NOTE — Telephone Encounter (Signed)
I called and spoke with the patient concerning her [facility] appt.. I informed the patient that Dr. Glennon Mac nurse will review her information and call me Jasmine December) back tomorrow with an appt. If they accept her.

## 2013-05-03 DIAGNOSIS — H2589 Other age-related cataract: Secondary | ICD-10-CM | POA: Diagnosis not present

## 2013-05-03 DIAGNOSIS — H269 Unspecified cataract: Secondary | ICD-10-CM | POA: Diagnosis not present

## 2013-05-03 DIAGNOSIS — H2510 Age-related nuclear cataract, unspecified eye: Secondary | ICD-10-CM | POA: Diagnosis not present

## 2013-05-04 ENCOUNTER — Ambulatory Visit: Payer: Self-pay | Admitting: Neurology

## 2013-05-09 ENCOUNTER — Telehealth: Payer: Self-pay | Admitting: *Deleted

## 2013-05-09 NOTE — Telephone Encounter (Signed)
I called [phone] re: appt at [facility].  Dr. Alphonzo Dublin thought pt best be served by movement d/o MD after reviewing notes.   Carolynne Edouard, coordinator for movement d/o clinic will be reviewing the information then will hopefully be set up for appt with Dr. Rubin Payor or Dr. Zola Button.  I LMVM for pt about this.   They are to call back if questions.

## 2013-08-18 DIAGNOSIS — R279 Unspecified lack of coordination: Secondary | ICD-10-CM | POA: Diagnosis not present

## 2013-08-18 DIAGNOSIS — G255 Other chorea: Secondary | ICD-10-CM | POA: Diagnosis not present

## 2013-08-18 DIAGNOSIS — F0390 Unspecified dementia without behavioral disturbance: Secondary | ICD-10-CM | POA: Diagnosis not present

## 2013-08-21 DIAGNOSIS — F0390 Unspecified dementia without behavioral disturbance: Secondary | ICD-10-CM | POA: Diagnosis not present

## 2013-08-21 DIAGNOSIS — R279 Unspecified lack of coordination: Secondary | ICD-10-CM | POA: Diagnosis not present

## 2013-08-21 DIAGNOSIS — G255 Other chorea: Secondary | ICD-10-CM | POA: Diagnosis not present

## 2013-09-01 DIAGNOSIS — G255 Other chorea: Secondary | ICD-10-CM | POA: Diagnosis not present

## 2013-09-01 DIAGNOSIS — Z9889 Other specified postprocedural states: Secondary | ICD-10-CM | POA: Diagnosis not present

## 2013-09-01 DIAGNOSIS — F0390 Unspecified dementia without behavioral disturbance: Secondary | ICD-10-CM | POA: Diagnosis not present

## 2013-09-01 DIAGNOSIS — G9389 Other specified disorders of brain: Secondary | ICD-10-CM | POA: Diagnosis not present

## 2013-09-01 DIAGNOSIS — R279 Unspecified lack of coordination: Secondary | ICD-10-CM | POA: Diagnosis not present

## 2013-11-24 DIAGNOSIS — IMO0001 Reserved for inherently not codable concepts without codable children: Secondary | ICD-10-CM | POA: Diagnosis not present

## 2013-11-24 DIAGNOSIS — R946 Abnormal results of thyroid function studies: Secondary | ICD-10-CM | POA: Diagnosis not present

## 2013-12-09 ENCOUNTER — Inpatient Hospital Stay (HOSPITAL_COMMUNITY)
Admission: EM | Admit: 2013-12-09 | Discharge: 2013-12-14 | DRG: 087 | Disposition: A | Discharge status: Rehabilitation Facility | Payer: Medicare Other | Attending: General Surgery | Admitting: General Surgery

## 2013-12-09 ENCOUNTER — Emergency Department (HOSPITAL_COMMUNITY): Payer: Medicare Other

## 2013-12-09 ENCOUNTER — Encounter (HOSPITAL_COMMUNITY): Payer: Self-pay | Admitting: Emergency Medicine

## 2013-12-09 DIAGNOSIS — S065X9A Traumatic subdural hemorrhage with loss of consciousness of unspecified duration, initial encounter: Secondary | ICD-10-CM | POA: Diagnosis not present

## 2013-12-09 DIAGNOSIS — E0590 Thyrotoxicosis, unspecified without thyrotoxic crisis or storm: Secondary | ICD-10-CM | POA: Diagnosis present

## 2013-12-09 DIAGNOSIS — S0993XA Unspecified injury of face, initial encounter: Secondary | ICD-10-CM | POA: Diagnosis not present

## 2013-12-09 DIAGNOSIS — E7800 Pure hypercholesterolemia, unspecified: Secondary | ICD-10-CM | POA: Diagnosis present

## 2013-12-09 DIAGNOSIS — S02109A Fracture of base of skull, unspecified side, initial encounter for closed fracture: Secondary | ICD-10-CM

## 2013-12-09 DIAGNOSIS — E559 Vitamin D deficiency, unspecified: Secondary | ICD-10-CM | POA: Diagnosis present

## 2013-12-09 DIAGNOSIS — S066X9A Traumatic subarachnoid hemorrhage with loss of consciousness of unspecified duration, initial encounter: Secondary | ICD-10-CM

## 2013-12-09 DIAGNOSIS — S0280XA Fracture of other specified skull and facial bones, unspecified side, initial encounter for closed fracture: Principal | ICD-10-CM | POA: Diagnosis present

## 2013-12-09 DIAGNOSIS — S06309A Unspecified focal traumatic brain injury with loss of consciousness of unspecified duration, initial encounter: Secondary | ICD-10-CM

## 2013-12-09 DIAGNOSIS — S064X9A Epidural hemorrhage with loss of consciousness of unspecified duration, initial encounter: Principal | ICD-10-CM

## 2013-12-09 DIAGNOSIS — S064XAA Epidural hemorrhage with loss of consciousness status unknown, initial encounter: Secondary | ICD-10-CM | POA: Diagnosis present

## 2013-12-09 DIAGNOSIS — S069X9A Unspecified intracranial injury with loss of consciousness of unspecified duration, initial encounter: Secondary | ICD-10-CM | POA: Diagnosis not present

## 2013-12-09 DIAGNOSIS — W19XXXA Unspecified fall, initial encounter: Secondary | ICD-10-CM

## 2013-12-09 DIAGNOSIS — S020XXA Fracture of vault of skull, initial encounter for closed fracture: Secondary | ICD-10-CM | POA: Diagnosis not present

## 2013-12-09 DIAGNOSIS — M549 Dorsalgia, unspecified: Secondary | ICD-10-CM | POA: Diagnosis present

## 2013-12-09 DIAGNOSIS — H8110 Benign paroxysmal vertigo, unspecified ear: Secondary | ICD-10-CM | POA: Diagnosis present

## 2013-12-09 DIAGNOSIS — S065XAA Traumatic subdural hemorrhage with loss of consciousness status unknown, initial encounter: Secondary | ICD-10-CM

## 2013-12-09 DIAGNOSIS — S0630AA Unspecified focal traumatic brain injury with loss of consciousness status unknown, initial encounter: Secondary | ICD-10-CM

## 2013-12-09 DIAGNOSIS — K219 Gastro-esophageal reflux disease without esophagitis: Secondary | ICD-10-CM | POA: Diagnosis present

## 2013-12-09 DIAGNOSIS — S066XAA Traumatic subarachnoid hemorrhage with loss of consciousness status unknown, initial encounter: Secondary | ICD-10-CM | POA: Diagnosis not present

## 2013-12-09 DIAGNOSIS — I609 Nontraumatic subarachnoid hemorrhage, unspecified: Secondary | ICD-10-CM | POA: Diagnosis not present

## 2013-12-09 DIAGNOSIS — W108XXA Fall (on) (from) other stairs and steps, initial encounter: Secondary | ICD-10-CM | POA: Diagnosis not present

## 2013-12-09 DIAGNOSIS — S199XXA Unspecified injury of neck, initial encounter: Secondary | ICD-10-CM | POA: Diagnosis not present

## 2013-12-09 DIAGNOSIS — Z5189 Encounter for other specified aftercare: Secondary | ICD-10-CM | POA: Diagnosis not present

## 2013-12-09 DIAGNOSIS — E871 Hypo-osmolality and hyponatremia: Secondary | ICD-10-CM | POA: Diagnosis not present

## 2013-12-09 DIAGNOSIS — S069XAA Unspecified intracranial injury with loss of consciousness status unknown, initial encounter: Secondary | ICD-10-CM | POA: Diagnosis not present

## 2013-12-09 DIAGNOSIS — IMO0002 Reserved for concepts with insufficient information to code with codable children: Secondary | ICD-10-CM | POA: Diagnosis not present

## 2013-12-09 DIAGNOSIS — R51 Headache: Secondary | ICD-10-CM | POA: Diagnosis not present

## 2013-12-09 DIAGNOSIS — T148XXA Other injury of unspecified body region, initial encounter: Secondary | ICD-10-CM | POA: Diagnosis not present

## 2013-12-09 LAB — BASIC METABOLIC PANEL
BUN: 16 mg/dL (ref 6–23)
CALCIUM: 8.5 mg/dL (ref 8.4–10.5)
CO2: 22 mEq/L (ref 19–32)
Chloride: 101 mEq/L (ref 96–112)
Creatinine, Ser: 0.67 mg/dL (ref 0.50–1.10)
GLUCOSE: 149 mg/dL — AB (ref 70–99)
POTASSIUM: 3.8 meq/L (ref 3.7–5.3)
SODIUM: 138 meq/L (ref 137–147)

## 2013-12-09 LAB — CBC
HCT: 38.1 % (ref 36.0–46.0)
HEMOGLOBIN: 13.2 g/dL (ref 12.0–15.0)
MCH: 32.4 pg (ref 26.0–34.0)
MCHC: 34.6 g/dL (ref 30.0–36.0)
MCV: 93.4 fL (ref 78.0–100.0)
PLATELETS: 235 10*3/uL (ref 150–400)
RBC: 4.08 MIL/uL (ref 3.87–5.11)
RDW: 12.2 % (ref 11.5–15.5)
WBC: 16.3 10*3/uL — ABNORMAL HIGH (ref 4.0–10.5)

## 2013-12-09 LAB — PLATELET FUNCTION ASSAY: Collagen / Epinephrine: 88 seconds (ref 0–184)

## 2013-12-09 LAB — PROTIME-INR
INR: 0.98 (ref 0.00–1.49)
PROTHROMBIN TIME: 12.8 s (ref 11.6–15.2)

## 2013-12-09 MED ORDER — MORPHINE SULFATE 4 MG/ML IJ SOLN
4.0000 mg | Freq: Once | INTRAMUSCULAR | Status: AC
  Administered 2013-12-09: 4 mg via INTRAVENOUS
  Filled 2013-12-09: qty 1

## 2013-12-09 MED ORDER — MORPHINE SULFATE 2 MG/ML IJ SOLN
2.0000 mg | INTRAMUSCULAR | Status: DC | PRN
  Administered 2013-12-10: 2 mg via INTRAVENOUS
  Filled 2013-12-09: qty 1

## 2013-12-09 NOTE — ED Notes (Signed)
Pt given ice chips per Ed,RN

## 2013-12-09 NOTE — ED Notes (Signed)
Aspen collar removed by Dr. Georgette Dover.

## 2013-12-09 NOTE — ED Provider Notes (Signed)
I saw and evaluated the patient, reviewed the resident's note and I agree with the findings and plan.  EKG Interpretation   None      Fall, head injury. Had LOC initially but back to baseline now and neuro intact. CT results reviewed. Admit for obs.    Charles B. Karle Starch, MD 12/09/13 2352

## 2013-12-09 NOTE — H&P (Addendum)
Per ED physician report, this patient fell backwards and has bifrontal subarachnoid hemorrhage and contusions, as well as nondisplaced left parietal skull fracture.  Neurosurgery Luiz Ochoa) was called by ED while Trauma was in the operating room with another severely injured trauma patient for several hours.  Neurosurgery came to see the patient and left a consult note, but asked the ED to have Trauma admit the patient, even though all of her injuries are isolated to the head.  This patient was not evaluated by Trauma prior to admission, since she has been in the ED for several hours.  Trauma will write admission orders, but all nursing questions should be directed to Neurosurgery as they are the only ones that have evaluated this patient.  Claudia Perez. Claudia Dover, MD, [facility] Surgery  General/ Trauma Surgery  12/09/2013 10:48 PM   [phone] After finishing in the operating room, I was able to evaluate the patient. Claudia Perez is an 63 y.o. female.   Chief Complaint: Fall HPI: This is a 63 year old female who is status post a fall in which she landed on the back of her head yesterday evening. There was no reported loss of consciousness. There is no seizure activity. The patient complains only of headache. She has no neck pain. The patient reports a previous craniotomy for a hematoma in her brain. Apparently this was performed on the same side of her injury tonight.  Past Medical History  Diagnosis Date  . Mobility impaired   . Hypercholesteremia   . Vitamin D deficiency   . Shortness of breath     "even now, just talking" (03/27/2013)  . Hyperthyroidism     "a little bit; don't take RX for it" (03/27/2013)  . GERD (gastroesophageal reflux disease)   . Seizures     "think I might have; been blacking out several times in the past" (03/27/2013)  . Fall at home 03/27/2013    "lost consciousness; fractured right clavicle" (03/27/2013)    Past Surgical History  Procedure Laterality Date  .  Appendectomy  1960's  . Subdural hematoma evacuation via craniotomy  10/2003  . Cataract extraction w/ intraocular lens implant Left 02/2013    Family History  Problem Relation Age of Onset  . Hypertension Mother    Social History:  reports that she has never smoked. She has never used smokeless tobacco. She reports that she drinks alcohol. She reports that she does not use illicit drugs.  Allergies:  Allergies  Allergen Reactions  . Nitrofurantoin Itching  . Sulfamethoxazole-Trimethoprim Itching    Medications Prior to Admission  Medication Sig Dispense Refill  . Multiple Vitamin (MULTIVITAMIN WITH MINERALS) TABS Take 1 tablet by mouth daily.        Results for orders placed during the hospital encounter of 12/09/13 (from the past 48 hour(s))  CBC     Status: Abnormal   Collection Time    12/09/13  7:06 PM      Result Value Range   WBC 16.3 (*) 4.0 - 10.5 K/uL   RBC 4.08  3.87 - 5.11 MIL/uL   Hemoglobin 13.2  12.0 - 15.0 g/dL   HCT 38.1  36.0 - 46.0 %   MCV 93.4  78.0 - 100.0 fL   MCH 32.4  26.0 - 34.0 pg   MCHC 34.6  30.0 - 36.0 g/dL   RDW 12.2  11.5 - 15.5 %   Platelets 235  150 - 400 K/uL  BASIC METABOLIC PANEL     Status: Abnormal  Collection Time    12/09/13  7:06 PM      Result Value Range   Sodium 138  137 - 147 mEq/L   Potassium 3.8  3.7 - 5.3 mEq/L   Chloride 101  96 - 112 mEq/L   CO2 22  19 - 32 mEq/L   Glucose, Bld 149 (*) 70 - 99 mg/dL   BUN 16  6 - 23 mg/dL   Creatinine, Ser 0.67  0.50 - 1.10 mg/dL   Calcium 8.5  8.4 - 10.5 mg/dL   GFR calc non Af Amer >90  >90 mL/min   GFR calc Af Amer >90  >90 mL/min   Comment: (NOTE)     The eGFR has been calculated using the CKD EPI equation.     This calculation has not been validated in all clinical situations.     eGFR's persistently <90 mL/min signify possible Chronic Kidney     Disease.  PROTIME-INR     Status: None   Collection Time    12/09/13  7:06 PM      Result Value Range   Prothrombin Time 12.8   11.6 - 15.2 seconds   INR 0.98  0.00 - 1.49   Ct Head Wo Contrast  12/09/2013   CLINICAL DATA:  Head injury after fall.  EXAM: CT HEAD WITHOUT CONTRAST  TECHNIQUE: Contiguous axial images were obtained from the base of the skull through the vertex without intravenous contrast.  COMPARISON:  CT scan of March 27, 2013.  FINDINGS: Stable left occipital craniotomy. Large left posterior parietal and occipital scalp hematoma is noted. There appears to be a new nondisplaced left parietal skull fracture.  Mild diffuse cortical atrophy is noted. Focal intraparenchymal contusion is seen in right frontal lobe measuring 15 x 7 mm. Subdural hemorrhage is seen in the anterior interhemispheric fissure as well as subarachnoid hemorrhage seen in both frontal regions. Probable left tentorial subdural hematoma is noted as well. Small amount of subarachnoid hemorrhage is also seen in the region of the right temporal lobe. Ventricular size is within normal limits. No mass effect or midline shift is noted. There also appears to be some degree of subdural hemorrhage seen along the vertex of the falx.  IMPRESSION: Nondisplaced left parietal skull fracture is noted, with large left parietal and occipital scalp hematoma. Small subdural hemorrhage is seen in the anterior interhemispheric fissure as well as the vertex of the falx as well as left tentorial region. Right frontal intraparenchymal hemorrhage is noted. Subarachnoid hemorrhage is noted in bilateral frontal and right temporal regions.  Critical Value/emergent results were called by telephone at the time of interpretation on 12/09/2013 at 6:55 PM to [facility], who verbally acknowledged these results.   Electronically Signed   By: Sabino Dick M.D.   On: 12/09/2013 18:55    Review of Systems  Constitutional: Negative for weight loss.  HENT: Negative for ear discharge, ear pain, hearing loss and tinnitus.   Eyes: Negative for blurred vision, double vision,  photophobia and pain.  Respiratory: Negative for cough, sputum production and shortness of breath.   Cardiovascular: Negative for chest pain.  Gastrointestinal: Negative for nausea, vomiting and abdominal pain.  Genitourinary: Negative for dysuria, urgency, frequency and flank pain.  Musculoskeletal: Negative for back pain, falls, joint pain, myalgias and neck pain.  Neurological: Positive for headaches. Negative for dizziness, tingling, sensory change, focal weakness and loss of consciousness.  Endo/Heme/Allergies: Does not bruise/bleed easily.  Psychiatric/Behavioral: Negative for depression, memory loss and substance  abuse. The patient is not nervous/anxious.     Blood pressure 133/56, pulse 82, temperature 98.3 F (36.8 C), temperature source Oral, resp. rate 15, height _0  (1.549 m), weight 110 lb 0.2 oz (49.9 kg), SpO2 96.00%. Physical Exam  WDWN in NAD - patient awake and conversant HEENT:  EOMI, pupils equal and reactive Hematoma on left posterior scalp - no obvious laceration; tender to palpation Neck:  No masses, no thyromegaly; no tenderness; full range of motion Lungs:  CTA bilaterally; normal respiratory effort CV:  Regular rate and rhythm; no murmurs Abd:  +bowel sounds, soft, non-tender, no masses Ext:  Well-perfused; no edema Skin:  Warm, dry; no sign of jaundice  Assessment/Plan Fall Isolated neurosurgical injuries with left parietal skull fracture in the area of her previous craniotomy, small anterior subdural hematoma, right frontal intraparenchymal hemorrhage, bilateral frontal and right temporal subarachnoid hemorrhage  Plan:  Neurosurgery has not yet evaluated the patient.  The note that I saw earlier from the neurosurgeon on call was not complete.  The patient will be admitted to the ICU and neurosurgery will direct their management.  Cherre Kothari K. 12/10/2013, 2:09 AM

## 2013-12-09 NOTE — Consult Note (Addendum)
Reason for Consult:TBI Referring Physician: Freddi Che, MD   Claudia Perez is an 63 y.o. female.  HPI: unwitnessed fall down stairs  PMH:  Past Medical History  Diagnosis Date  . Mobility impaired   . Hypercholesteremia   . Vitamin D deficiency   . Shortness of breath     "even now, just talking" (03/27/2013)  . Hyperthyroidism     "a little bit; don't take RX for it" (03/27/2013)  . GERD (gastroesophageal reflux disease)   . Seizures     "think I might have; been blacking out several times in the past" (03/27/2013)  . Fall at home 03/27/2013    "lost consciousness; fractured right clavicle" (03/27/2013)    Past Surgical History  Procedure Laterality Date  . Appendectomy  1960's  . Subdural hematoma evacuation via craniotomy  10/2003  . Cataract extraction w/ intraocular lens implant Left 02/2013    Family History:  Family History  Problem Relation Age of Onset  . Hypertension Mother     Social History:  reports that she has never smoked. She has never used smokeless tobacco. She reports that she drinks alcohol. She reports that she does not use illicit drugs.  Allergies:  Allergies  Allergen Reactions  . Nitrofurantoin Itching  . Sulfamethoxazole-Trimethoprim Itching    Medications: Prior to Admission medications   Medication Sig Start Date End Date Taking? Authorizing Provider  Multiple Vitamin (MULTIVITAMIN WITH MINERALS) TABS Take 1 tablet by mouth daily.   Yes Historical Provider, MD    No results found for this or any previous visit (from the past 48 hour(s)).  Ct Head Wo Contrast  12/09/2013   CLINICAL DATA:  Head injury after fall.  EXAM: CT HEAD WITHOUT CONTRAST  TECHNIQUE: Contiguous axial images were obtained from the base of the skull through the vertex without intravenous contrast.  COMPARISON:  CT scan of March 27, 2013.  FINDINGS: Stable left occipital craniotomy. Large left posterior parietal and occipital scalp hematoma is noted. There appears to be  a new nondisplaced left parietal skull fracture.  Mild diffuse cortical atrophy is noted. Focal intraparenchymal contusion is seen in right frontal lobe measuring 15 x 7 mm. Subdural hemorrhage is seen in the anterior interhemispheric fissure as well as subarachnoid hemorrhage seen in both frontal regions. Probable left tentorial subdural hematoma is noted as well. Small amount of subarachnoid hemorrhage is also seen in the region of the right temporal lobe. Ventricular size is within normal limits. No mass effect or midline shift is noted. There also appears to be some degree of subdural hemorrhage seen along the vertex of the falx.  IMPRESSION: Nondisplaced left parietal skull fracture is noted, with large left parietal and occipital scalp hematoma. Small subdural hemorrhage is seen in the anterior interhemispheric fissure as well as the vertex of the falx as well as left tentorial region. Right frontal intraparenchymal hemorrhage is noted. Subarachnoid hemorrhage is noted in bilateral frontal and right temporal regions.  Critical Value/emergent results were called by telephone at the time of interpretation on 12/09/2013 at 6:55 PM to [facility], who verbally acknowledged these results.   Electronically Signed   By: Sabino Dick M.D.   On: 12/09/2013 18:55    ROS Constitutional: Negative for weight loss.  HENT: Negative for ear discharge, ear pain, hearing loss and tinnitus.  Eyes: Negative for blurred vision, double vision, photophobia and pain.  Respiratory: Negative for cough, sputum production and shortness of breath.  Cardiovascular: Negative for chest pain.  Gastrointestinal:  Negative for nausea, vomiting and abdominal pain.  Genitourinary: Negative for dysuria, urgency, frequency and flank pain.  Musculoskeletal: Negative for back pain, falls, joint pain, myalgias and neck pain.  Neurological: Positive for headaches. Negative for dizziness, tingling, sensory change, focal weakness and  loss of consciousness.  Endo/Heme/Allergies: Does not bruise/bleed easily.  Psychiatric/Behavioral: Negative for depression, memory loss and substance abuse. The patient is not nervous/anxious.    Blood pressure 149/69, pulse 93, temperature 97.3 F (36.3 C), resp. rate 16, height 5\' 1"  (1.549 m), weight 49.896 kg (110 lb), SpO2 98.00%. Physical Exam AAOx3 FC all 4 ext ,  CN II-XII intact Neck supple, NT  Assessment/Plan: Pt with TBI, bifrontal SAH/contusions  -   Admit for observation, repeat CT Monday am  Alan Riles R, MD 12/09/2013, 8:54 PM

## 2013-12-09 NOTE — ED Notes (Signed)
Aspen collar applied with assistance from [place], [place]

## 2013-12-09 NOTE — ED Notes (Signed)
Dr. Georgette Dover at bedside. Aspen collar on.

## 2013-12-09 NOTE — ED Notes (Signed)
Pt from home, c/o un witnessed fall down stairs. Per husband, pt was responsive to commands post fall but states slow to respond and does not remember falling. Pt alert and oriented at this time, does not remember fall. Hematoma to left lateral occipital region. No other deformities, PMS intact.

## 2013-12-09 NOTE — ED Provider Notes (Signed)
CSN: [id number]     Arrival date & time 12/09/13  1736 History   First MD Initiated Contact with Patient 12/09/13 1746     Chief Complaint  Patient presents with  . Fall   (Consider location/radiation/quality/duration/timing/severity/associated sxs/prior Treatment) Patient is a 63 y.o. female presenting with head injury.  Head Injury Location:  Occipital Time since incident:  1 hour Mechanism of injury: fall   Pain details:    Quality:  Sharp   Severity:  Moderate   Timing:  Constant Chronicity:  New Relieved by:  Nothing Associated symptoms: headache and loss of consciousness   Associated symptoms: no blurred vision, no disorientation, no double vision, no nausea, no numbness and no vomiting   Risk factors: being elderly     Past Medical History  Diagnosis Date  . Mobility impaired   . Hypercholesteremia   . Vitamin D deficiency   . Shortness of breath     "even now, just talking" (03/27/2013)  . Hyperthyroidism     "a little bit; don't take RX for it" (03/27/2013)  . GERD (gastroesophageal reflux disease)   . Seizures     "think I might have; been blacking out several times in the past" (03/27/2013)  . Fall at home 03/27/2013    "lost consciousness; fractured right clavicle" (03/27/2013)   Past Surgical History  Procedure Laterality Date  . Appendectomy  1960's  . Subdural hematoma evacuation via craniotomy  10/2003  . Cataract extraction w/ intraocular lens implant Left 02/2013   Family History  Problem Relation Age of Onset  . Hypertension Mother    History  Substance Use Topics  . Smoking status: Never Smoker   . Smokeless tobacco: Never Used  . Alcohol Use: Yes     Comment: 03/27/2013 glass of wine "couple times/yr"   OB History   Grav Para Term Preterm Abortions TAB SAB Ect Mult Living                 Review of Systems  Constitutional: Negative for fever and chills.  HENT: Negative for sore throat.   Eyes: Negative for blurred vision, double vision and  pain.  Respiratory: Negative for cough and shortness of breath.   Cardiovascular: Negative for chest pain.  Gastrointestinal: Negative for nausea, vomiting, abdominal pain and diarrhea.  Genitourinary: Negative for dysuria.  Musculoskeletal: Negative for back pain.  Skin: Negative for rash.  Neurological: Positive for loss of consciousness and headaches. Negative for numbness.    Allergies  Nitrofurantoin and Sulfamethoxazole-trimethoprim  Home Medications   Current Outpatient Rx  Name  Route  Sig  Dispense  Refill  . Multiple Vitamin (MULTIVITAMIN WITH MINERALS) TABS   Oral   Take 1 tablet by mouth daily.          BP 122/67  Pulse 78  Temp(Src) 97.3 F (36.3 C)  Resp 16  Ht 5\' 1"  (1.549 m)  Wt 110 lb (49.896 kg)  BMI 20.80 kg/m2  SpO2 87% Physical Exam  Constitutional: She is oriented to person, place, and time. She appears well-developed and well-nourished. No distress.  HENT:  Head: Normocephalic and atraumatic.    Eyes: Pupils are equal, round, and reactive to light. Right eye exhibits no discharge. Left eye exhibits no discharge.  Neck: Normal range of motion.  Cardiovascular: Normal rate, regular rhythm and normal heart sounds.   Pulmonary/Chest: Effort normal and breath sounds normal.  Abdominal: Soft. She exhibits no distension. There is no tenderness.  Musculoskeletal: Normal range of motion.  Neurological: She is alert and oriented to person, place, and time.  Skin: Skin is warm. She is not diaphoretic.    ED Course  Procedures (including critical care time) Labs Review Labs Reviewed  CBC - Abnormal; Notable for the following:    WBC 16.3 (*)    All other components within normal limits  BASIC METABOLIC PANEL - Abnormal; Notable for the following:    Glucose, Bld 149 (*)    All other components within normal limits  PROTIME-INR   Imaging Review Ct Head Wo Contrast  12/09/2013   CLINICAL DATA:  Head injury after fall.  EXAM: CT HEAD WITHOUT  CONTRAST  TECHNIQUE: Contiguous axial images were obtained from the base of the skull through the vertex without intravenous contrast.  COMPARISON:  CT scan of March 27, 2013.  FINDINGS: Stable left occipital craniotomy. Large left posterior parietal and occipital scalp hematoma is noted. There appears to be a new nondisplaced left parietal skull fracture.  Mild diffuse cortical atrophy is noted. Focal intraparenchymal contusion is seen in right frontal lobe measuring 15 x 7 mm. Subdural hemorrhage is seen in the anterior interhemispheric fissure as well as subarachnoid hemorrhage seen in both frontal regions. Probable left tentorial subdural hematoma is noted as well. Small amount of subarachnoid hemorrhage is also seen in the region of the right temporal lobe. Ventricular size is within normal limits. No mass effect or midline shift is noted. There also appears to be some degree of subdural hemorrhage seen along the vertex of the falx.  IMPRESSION: Nondisplaced left parietal skull fracture is noted, with large left parietal and occipital scalp hematoma. Small subdural hemorrhage is seen in the anterior interhemispheric fissure as well as the vertex of the falx as well as left tentorial region. Right frontal intraparenchymal hemorrhage is noted. Subarachnoid hemorrhage is noted in bilateral frontal and right temporal regions.  Critical Value/emergent results were called by telephone at the time of interpretation on 12/09/2013 at 6:55 PM to [facility], who verbally acknowledged these results.   Electronically Signed   By: Sabino Dick M.D.   On: 12/09/2013 18:55    EKG Interpretation   None       MDM   1. Fall, initial encounter   2. SDH (subdural hematoma)   3. SAH (subarachnoid hemorrhage)    63 yo F with hx of prior falls, HTN, HLD, who presents after a fall down the stairs. Per the husband, the patient was less responsive initially, but has since improved, and has pain only in the back of  her head.   Initial CT demonstrates acute SDH, SAH, IPH. No other scans indicated as this appears to be isolated injury. NSU consulted, will see patient in the morning. Recommend trauma admit. Trauma surgery to admit patient overnight.   Patient HDS while in ED. Anticipate admission to the trauma floor in stable condition. Patient seen and evaluated by myself and my attending, Dr. Karle Starch.      Freddi Che, MD 12/09/13 [place], MD 12/09/13 2312

## 2013-12-10 ENCOUNTER — Encounter (HOSPITAL_COMMUNITY): Payer: Self-pay | Admitting: *Deleted

## 2013-12-10 LAB — BASIC METABOLIC PANEL
BUN: 13 mg/dL (ref 6–23)
CO2: 23 meq/L (ref 19–32)
Calcium: 8.8 mg/dL (ref 8.4–10.5)
Chloride: 101 mEq/L (ref 96–112)
Creatinine, Ser: 0.58 mg/dL (ref 0.50–1.10)
GFR calc Af Amer: >90 mL/min (ref >90)
GFR calc non Af Amer: >90 mL/min (ref >90)
GLUCOSE: 167 mg/dL — AB (ref 70–99)
POTASSIUM: 4.1 meq/L (ref 3.7–5.3)
SODIUM: 137 meq/L (ref 137–147)

## 2013-12-10 LAB — CBC
HEMATOCRIT: 37.2 % (ref 36.0–46.0)
HEMOGLOBIN: 13.1 g/dL (ref 12.0–15.0)
MCH: 32.9 pg (ref 26.0–34.0)
MCHC: 35.2 g/dL (ref 30.0–36.0)
MCV: 93.5 fL (ref 78.0–100.0)
Platelets: 232 10*3/uL (ref 150–400)
RBC: 3.98 MIL/uL (ref 3.87–5.11)
RDW: 12.4 % (ref 11.5–15.5)
WBC: 13.6 10*3/uL — ABNORMAL HIGH (ref 4.0–10.5)

## 2013-12-10 LAB — PROTIME-INR
INR: 0.97 (ref 0.00–1.49)
Prothrombin Time: 12.7 seconds (ref 11.6–15.2)

## 2013-12-10 LAB — MRSA PCR SCREENING: MRSA BY PCR: NEGATIVE

## 2013-12-10 MED ORDER — ONDANSETRON HCL 4 MG/2ML IJ SOLN: 4.0000 mg | Freq: Four times a day (QID) | INTRAMUSCULAR | Status: DC | PRN

## 2013-12-10 MED ORDER — SODIUM CHLORIDE 0.9 % IV SOLN
INTRAVENOUS | Status: DC
Start: 2013-12-10 — End: 2013-12-11
  Administered 2013-12-10: 1000 mL via INTRAVENOUS

## 2013-12-10 MED ORDER — OXYCODONE-ACETAMINOPHEN 5-325 MG PO TABS
1.0000 | ORAL_TABLET | ORAL | Status: DC | PRN
  Administered 2013-12-10 – 2013-12-11 (×2): 1 via ORAL
  Filled 2013-12-10 (×2): qty 1

## 2013-12-10 MED ORDER — ONDANSETRON HCL 4 MG PO TABS: 4.0000 mg | ORAL_TABLET | Freq: Four times a day (QID) | ORAL | Status: DC | PRN

## 2013-12-10 NOTE — Progress Notes (Signed)
Received patient from ICU; one assist to the bathroom then returned to bed; patient is alert with no complaints;oriented to the unit and call bell; husband at bedside.

## 2013-12-10 NOTE — Progress Notes (Signed)
Frontal contusions and SAH, all neurosurgical injuries only.  With this particular problem it would have been helpful if neurosurgery could have taken care of this isolated injury, especially when the trauma surgeon on call was tied up with a dying patient requiring his full attention.  This was a ground level fall, not a high energy mechanism.  This patient has been seen and I agree with the findings and treatment plan.  Claudia Perez. Dahlia Bailiff, MD, [place] [phone] (pager) [phone] (direct pager) Trauma Surgeon

## 2013-12-10 NOTE — Progress Notes (Signed)
Subjective: Patient reports HA, sl neck/shoulder ache  Objective: Vital signs in last 24 hours: Temp:  [97.3 F (36.3 C)-98.3 F (36.8 C)] 98.3 F (36.8 C) (01/11 0045) Pulse Rate:  [72-101] 72 (01/11 0300) Resp:  [15-19] 16 (01/11 0300) BP: (118-156)/(52-93) 129/62 mmHg (01/11 0300) SpO2:  [87 %-99 %] 97 % (01/11 0300) Weight:  [49.896 kg (110 lb)-49.9 kg (110 lb 0.2 oz)] 49.9 kg (110 lb 0.2 oz) (01/11 0100)  Intake/Output from previous day:   Intake/Output this shift:    AAOx3  - moves all well, pupils reactive  Lab Results:  Recent Labs  12/09/13 1906  WBC 16.3*  HGB 13.2  HCT 38.1  PLT 235   BMET  Recent Labs  12/09/13 1906  NA 138  K 3.8  CL 101  CO2 22  GLUCOSE 149*  BUN 16  CREATININE 0.67  CALCIUM 8.5    Studies/Results: Ct Head Wo Contrast  12/09/2013   CLINICAL DATA:  Head injury after fall.  EXAM: CT HEAD WITHOUT CONTRAST  TECHNIQUE: Contiguous axial images were obtained from the base of the skull through the vertex without intravenous contrast.  COMPARISON:  CT scan of March 27, 2013.  FINDINGS: Stable left occipital craniotomy. Large left posterior parietal and occipital scalp hematoma is noted. There appears to be a new nondisplaced left parietal skull fracture.  Mild diffuse cortical atrophy is noted. Focal intraparenchymal contusion is seen in right frontal lobe measuring 15 x 7 mm. Subdural hemorrhage is seen in the anterior interhemispheric fissure as well as subarachnoid hemorrhage seen in both frontal regions. Probable left tentorial subdural hematoma is noted as well. Small amount of subarachnoid hemorrhage is also seen in the region of the right temporal lobe. Ventricular size is within normal limits. No mass effect or midline shift is noted. There also appears to be some degree of subdural hemorrhage seen along the vertex of the falx.  IMPRESSION: Nondisplaced left parietal skull fracture is noted, with large left parietal and occipital scalp  hematoma. Small subdural hemorrhage is seen in the anterior interhemispheric fissure as well as the vertex of the falx as well as left tentorial region. Right frontal intraparenchymal hemorrhage is noted. Subarachnoid hemorrhage is noted in bilateral frontal and right temporal regions.  Critical Value/emergent results were called by telephone at the time of interpretation on 12/09/2013 at 6:55 PM to [facility], who verbally acknowledged these results.   Electronically Signed   By: Sabino Dick M.D.   On: 12/09/2013 18:55    Assessment/Plan: Pt stable, CT early Monday am   LOS: 1 day     Chrisette Man R, MD 12/10/2013, 4:40 AM

## 2013-12-10 NOTE — Progress Notes (Signed)
Patient ID: Claudia Perez, female   DOB: 11-Mar-1951, 63 y.o.   MRN: [id number]  LOS: 1 day   Subjective: C/o a headache, no weakness, vision changes.  No further nausea or vomiting.  Adequate urine output.  denies abdominal pain.   Husband at bedside.    Objective: Vital signs in last 24 hours: Temp:  [97.3 F (36.3 C)-98.5 F (36.9 C)] 98.5 F (36.9 C) (01/11 0800) Pulse Rate:  [72-101] 78 (01/11 1100) Resp:  [15-20] 17 (01/11 1100) BP: (118-156)/(52-93) 132/60 mmHg (01/11 1100) SpO2:  [87 %-99 %] 98 % (01/11 1100) Weight:  [110 lb (49.896 kg)-110 lb 0.2 oz (49.9 kg)] 110 lb 0.2 oz (49.9 kg) (01/11 0100) Last BM Date: 12/09/13  Lab Results:  CBC  Recent Labs  12/09/13 1906 12/10/13 0535  WBC 16.3* 13.6*  HGB 13.2 13.1  HCT 38.1 37.2  PLT 235 232   BMET  Recent Labs  12/09/13 1906 12/10/13 0535  NA 138 137  K 3.8 4.1  CL 101 101  CO2 22 23  GLUCOSE 149* 167*  BUN 16 13  CREATININE 0.67 0.58  CALCIUM 8.5 8.8    Imaging: Ct Head Wo Contrast  12/09/2013   CLINICAL DATA:  Head injury after fall.  EXAM: CT HEAD WITHOUT CONTRAST  TECHNIQUE: Contiguous axial images were obtained from the base of the skull through the vertex without intravenous contrast.  COMPARISON:  CT scan of March 27, 2013.  FINDINGS: Stable left occipital craniotomy. Large left posterior parietal and occipital scalp hematoma is noted. There appears to be a new nondisplaced left parietal skull fracture.  Mild diffuse cortical atrophy is noted. Focal intraparenchymal contusion is seen in right frontal lobe measuring 15 x 7 mm. Subdural hemorrhage is seen in the anterior interhemispheric fissure as well as subarachnoid hemorrhage seen in both frontal regions. Probable left tentorial subdural hematoma is noted as well. Small amount of subarachnoid hemorrhage is also seen in the region of the right temporal lobe. Ventricular size is within normal limits. No mass effect or midline shift is noted. There also  appears to be some degree of subdural hemorrhage seen along the vertex of the falx.  IMPRESSION: Nondisplaced left parietal skull fracture is noted, with large left parietal and occipital scalp hematoma. Small subdural hemorrhage is seen in the anterior interhemispheric fissure as well as the vertex of the falx as well as left tentorial region. Right frontal intraparenchymal hemorrhage is noted. Subarachnoid hemorrhage is noted in bilateral frontal and right temporal regions.  Critical Value/emergent results were called by telephone at the time of interpretation on 12/09/2013 at 6:55 PM to [facility], who verbally acknowledged these results.   Electronically Signed   By: Sabino Dick M.D.   On: 12/09/2013 18:55     PE: General appearance: alert, cooperative and no distress Head: Normocephalic, without obvious abnormality Eyes: conjunctivae/corneas clear. PERRL, EOM's intact. Fundi benign. Resp: clear to auscultation bilaterally Cardio: regular rate and rhythm, S1, S2 normal, no murmur, click, rub or gallop GI: soft, non-tender; bowel sounds normal; no masses,  no organomegaly Extremities: extremities normal, atraumatic, no cyanosis or edema Neurologic: Alert and oriented X 3, normal strength and tone. Normal symmetric reflexes. Normal coordination and gait    Patient Active Problem List   Diagnosis Date Noted  . Subarachnoid hemorrhage following injury 12/09/2013  . Syncope 03/27/2013  . Clavicular fracture 03/27/2013  . COUGH 12/28/2007    Assessment/Plan: Fall Left parietal skull fracture, small anterior subdural hematoma, right front  intraparenchymal hemorrhage, bilateral frontal and right temporal SAH -repeat CT in AM -continue with neuro checks -start clears, advance diet as tolerate -mobilize, PT eval VTE - SCD's, Lovenox contraindicated due to #1 FEN - clears Dispo -- repeat CT in AM, PT/OT eval   Erby Pian, ANP-BC Pager: [phone] General Trauma [place] Pager:  [phone]   12/10/2013 11:17 AM

## 2013-12-11 ENCOUNTER — Inpatient Hospital Stay (HOSPITAL_COMMUNITY): Payer: Medicare Other

## 2013-12-11 ENCOUNTER — Encounter (HOSPITAL_COMMUNITY): Payer: Self-pay | Admitting: *Deleted

## 2013-12-11 DIAGNOSIS — M549 Dorsalgia, unspecified: Secondary | ICD-10-CM | POA: Diagnosis present

## 2013-12-11 DIAGNOSIS — S02109A Fracture of base of skull, unspecified side, initial encounter for closed fracture: Secondary | ICD-10-CM

## 2013-12-11 DIAGNOSIS — S0630AA Unspecified focal traumatic brain injury with loss of consciousness status unknown, initial encounter: Secondary | ICD-10-CM

## 2013-12-11 DIAGNOSIS — S06309A Unspecified focal traumatic brain injury with loss of consciousness of unspecified duration, initial encounter: Secondary | ICD-10-CM

## 2013-12-11 DIAGNOSIS — H8110 Benign paroxysmal vertigo, unspecified ear: Secondary | ICD-10-CM

## 2013-12-11 DIAGNOSIS — W108XXA Fall (on) (from) other stairs and steps, initial encounter: Secondary | ICD-10-CM

## 2013-12-11 MED ORDER — MORPHINE SULFATE 2 MG/ML IJ SOLN: 2.0000 mg | INTRAMUSCULAR | Status: DC | PRN

## 2013-12-11 MED ORDER — TRAMADOL HCL 50 MG PO TABS
50.0000 mg | ORAL_TABLET | Freq: Four times a day (QID) | ORAL | Status: DC | PRN
  Administered 2013-12-12 – 2013-12-13 (×2): 100 mg via ORAL
  Administered 2013-12-13 – 2013-12-14 (×2): 50 mg via ORAL
  Filled 2013-12-11: qty 1
  Filled 2013-12-11: qty 2
  Filled 2013-12-11: qty 1
  Filled 2013-12-11: qty 2

## 2013-12-11 MED ORDER — METHOCARBAMOL 500 MG PO TABS
500.0000 mg | ORAL_TABLET | Freq: Four times a day (QID) | ORAL | Status: DC | PRN
Start: 2013-12-11 — End: 2013-12-14
  Administered 2013-12-13: 1000 mg via ORAL
  Filled 2013-12-11: qty 2

## 2013-12-11 NOTE — Progress Notes (Signed)
CT t-spine neg Patient examined and I agree with the assessment and plan  Georganna Skeans, MD, MPH, FACS Pager: [phone]  12/11/2013 5:00 PM

## 2013-12-11 NOTE — Progress Notes (Signed)
Subjective: Patient reports HA  Objective: Vital signs in last 24 hours: Temp:  [97.8 F (36.6 C)-98.6 F (37 C)] 97.8 F (36.6 C) (01/12 0951) Pulse Rate:  [67-94] 77 (01/12 0951) Resp:  [18-27] 20 (01/12 0951) BP: (112-153)/(52-92) 114/65 mmHg (01/12 0951) SpO2:  [94 %-99 %] 98 % (01/12 0951) Weight:  [49.9 kg (110 lb 0.2 oz)] 49.9 kg (110 lb 0.2 oz) (01/11 1645)  Intake/Output from previous day: 01/11 0701 - 01/12 0700 In: 732.5 [P.O.:120; I.V.:612.5] Out: 400 [Urine:400] Intake/Output this shift:    no change, Pt AA  Lab Results:  Recent Labs  12/09/13 1906 12/10/13 0535  WBC 16.3* 13.6*  HGB 13.2 13.1  HCT 38.1 37.2  PLT 235 232   BMET  Recent Labs  12/09/13 1906 12/10/13 0535  NA 138 137  K 3.8 4.1  CL 101 101  CO2 22 23  GLUCOSE 149* 167*  BUN 16 13  CREATININE 0.67 0.58  CALCIUM 8.5 8.8    Studies/Results: Ct Head Wo Contrast  12/11/2013   CLINICAL DATA:  Traumatic brain injury.  EXAM: CT HEAD WITHOUT CONTRAST  TECHNIQUE: Contiguous axial images were obtained from the base of the skull through the vertex without intravenous contrast.  COMPARISON:  Head CT from 2 days prior  FINDINGS: Skull and Sinuses:No interval displacement of a left parietal bone fracture that is nondepressed. The visible mastoids, middle ears, and paranasal sinuses are aerated. There is a remote left mandibular coronoid process fracture.  Orbits: Bilateral cataract resection.  Brain: Inferior bifrontal and right temporal pole hemorrhagic contusion show generalized increase in hemorrhage and surrounding edema. The previously measured hematoma in the inferior right frontal lobe, is now 20 x 7 mm compared to 15 x 7 mm. Subdural hemorrhage around the frontal cerebral convexities and along the falx is relatively stable. There is scattered subarachnoid hemorrhage which has mildly increased. No hydrocephalus, mass lesion, or shift. Remote parafalcine left parietal infarct.  IMPRESSION: 1.  Bifrontal and right temporal hemorrhagic contusions show modest increase from 12/09/2013. 2. Small volume, multi focal subarachnoid hemorrhage shows mild increase. No hydrocephalus. 3. Thin, multi focal subdural hematoma is without significant increase. 4. Nondepressed left calvarial fracture, unchanged.   Electronically Signed   By: Jorje Guild M.D.   On: 12/11/2013 06:02   Ct Head Wo Contrast  12/09/2013   CLINICAL DATA:  Head injury after fall.  EXAM: CT HEAD WITHOUT CONTRAST  TECHNIQUE: Contiguous axial images were obtained from the base of the skull through the vertex without intravenous contrast.  COMPARISON:  CT scan of March 27, 2013.  FINDINGS: Stable left occipital craniotomy. Large left posterior parietal and occipital scalp hematoma is noted. There appears to be a new nondisplaced left parietal skull fracture.  Mild diffuse cortical atrophy is noted. Focal intraparenchymal contusion is seen in right frontal lobe measuring 15 x 7 mm. Subdural hemorrhage is seen in the anterior interhemispheric fissure as well as subarachnoid hemorrhage seen in both frontal regions. Probable left tentorial subdural hematoma is noted as well. Small amount of subarachnoid hemorrhage is also seen in the region of the right temporal lobe. Ventricular size is within normal limits. No mass effect or midline shift is noted. There also appears to be some degree of subdural hemorrhage seen along the vertex of the falx.  IMPRESSION: Nondisplaced left parietal skull fracture is noted, with large left parietal and occipital scalp hematoma. Small subdural hemorrhage is seen in the anterior interhemispheric fissure as well as the vertex of  the falx as well as left tentorial region. Right frontal intraparenchymal hemorrhage is noted. Subarachnoid hemorrhage is noted in bilateral frontal and right temporal regions.  Critical Value/emergent results were called by telephone at the time of interpretation on 12/09/2013 at 6:55 PM to  [facility], who verbally acknowledged these results.   Electronically Signed   By: Sabino Dick M.D.   On: 12/09/2013 18:55   Ct Cervical Spine Wo Contrast  12/11/2013   CLINICAL DATA:  Injury after fall.  EXAM: CT CERVICAL SPINE WITHOUT CONTRAST  TECHNIQUE: Multidetector CT imaging of the cervical spine was performed without intravenous contrast. Multiplanar CT image reconstructions were also generated.  COMPARISON:  09/30/2012  FINDINGS: There is no report generated for this study on 12/09/2013. It is now being re-presented for dictation.  Imaging was obtained from the skullbase through the T1-2 interspace. There is no evidence for fracture. No subluxation. Loss of disc height with endplate spurring is seen around the C5-6 interspace. The facets are well aligned bilaterally. No evidence for prevertebral soft tissue swelling.  IMPRESSION: Stable.  No cervical spine fracture.   Electronically Signed   By: Misty Stanley M.D.   On: 12/11/2013 11:23   Ct Thoracic Spine Wo Contrast  12/11/2013   CLINICAL DATA:  Right upper back pain since a fall down stairs on 12/09/2013.  EXAM: CT THORACIC SPINE WITHOUT CONTRAST  TECHNIQUE: Multidetector CT imaging of the thoracic spine was performed without intravenous contrast administration. Multiplanar CT image reconstructions were also generated.  COMPARISON:  Chest x-ray dated 12/28/2007  FINDINGS: There is no fracture, bone destruction, disc space narrowing, disc bulging or protrusion, foraminal or spinal stenosis, or other significant abnormality. The paraspinal soft tissues are normal. The adjacent visualized portions of the ribs are normal.  IMPRESSION: Normal exam.   Electronically Signed   By: Rozetta Nunnery M.D.   On: 12/11/2013 11:59    Assessment/Plan: CT head as expected    - increase activity  - use therapies if needed  - ? D/c in next couple days?  - F/U CT head in 1 month or so   - will follow while here  LOS: 2 days     Otilio Connors,  MD 12/11/2013, 12:58 PM

## 2013-12-11 NOTE — Progress Notes (Signed)
Patient ID: Claudia Perez, female   DOB: 28-Feb-1951, 63 y.o.   MRN: [id number]   LOS: 2 days   Subjective: Still c/o of HA but pain meds are effective. Also c/o vertigo with movement and back pain. She says the back pain has been present since the fall.   Objective: Vital signs in last 24 hours: Temp:  [98.1 F (36.7 C)-98.6 F (37 C)] 98.1 F (36.7 C) (01/12 0641) Pulse Rate:  [67-94] 81 (01/12 0641) Resp:  [15-27] 20 (01/12 0641) BP: (112-153)/(52-92) 112/66 mmHg (01/12 0641) SpO2:  [94 %-99 %] 94 % (01/12 0641) Weight:  [110 lb 0.2 oz (49.9 kg)] 110 lb 0.2 oz (49.9 kg) (01/11 1645) Last BM Date: 12/08/13   Radiology Results CT HEAD WITHOUT CONTRAST  TECHNIQUE:  Contiguous axial images were obtained from the base of the skull  through the vertex without intravenous contrast.  COMPARISON: Head CT from 2 days prior  FINDINGS:  Skull and Sinuses:No interval displacement of a left parietal bone  fracture that is nondepressed. The visible mastoids, middle ears,  and paranasal sinuses are aerated. There is a remote left mandibular  coronoid process fracture.  Orbits: Bilateral cataract resection.  Brain: Inferior bifrontal and right temporal pole hemorrhagic  contusion show generalized increase in hemorrhage and surrounding  edema. The previously measured hematoma in the inferior right  frontal lobe, is now 20 x 7 mm compared to 15 x 7 mm. Subdural  hemorrhage around the frontal cerebral convexities and along the  falx is relatively stable. There is scattered subarachnoid  hemorrhage which has mildly increased. No hydrocephalus, mass  lesion, or shift. Remote parafalcine left parietal infarct.  IMPRESSION:  1. Bifrontal and right temporal hemorrhagic contusions show modest  increase from 12/09/2013.  2. Small volume, multi focal subarachnoid hemorrhage shows mild  increase. No hydrocephalus.  3. Thin, multi focal subdural hematoma is without significant  increase.  4.  Nondepressed left calvarial fracture, unchanged.  Electronically Signed  By: Jorje Guild M.D.  On: 12/11/2013 06:02   Physical Exam General appearance: alert and no distress Back: TTP lower t-spine, no step-off Resp: clear to auscultation bilaterally Cardio: regular rate and rhythm GI: normal findings: bowel sounds normal and soft, non-tender   Assessment/Plan: Fall  Left parietal skull fracture, small anterior subdural hematoma, right front intraparenchymal hemorrhage, bilateral frontal and right temporal SAH -- HCT with increased size of right frontal/temporal ICC, will await evaluation by NS. Back pain -- Will get CT t-spine Vertigo -- Suspect BPPV, PT to eval FEN -  SL IV, change analgesic to non-narcotic VTE - SCD's Dispo -- Await NS plan    Lisette Abu, PA-C Pager: [phone] General Trauma PA Pager: [phone]   12/11/2013

## 2013-12-11 NOTE — Evaluation (Signed)
Occupational Therapy Evaluation Patient Details Name: Claudia Perez MRN: [id number] DOB: [birth year] Today's Date: 12/11/2013 Time: 9702-6378 OT Time Calculation (min): 20 min  OT Assessment / Plan / Recommendation History of present illness pt presents with fall down 17 stairs resulting in L Parietal Skull fx with R Fontal Intraparenchymal Hemorrhage, Bil Frontal and R Temporal SAH, and possible BPPV.     Clinical Impression   Pt presents with below problem list. Feel pt will benefit from acute OT to increase independence prior to d/c. Recommending CIR for additional rehab prior to d/c.    OT Assessment  Patient needs continued OT Services    Follow Up Recommendations  CIR    Barriers to Discharge      Equipment Recommendations  Other (comment) (defer to next venue)    Recommendations for Other Services    Frequency  Min 2X/week    Precautions / Restrictions Precautions Precautions: Fall Precaution Comments: Per Dr Renold Genta RN no quick head movements or "dangling her head off of bed".   Restrictions Weight Bearing Restrictions: No   Pertinent Vitals/Pain Pain in back and head. Nurse notified.     ADL  Eating/Feeding: Set up Where Assessed - Eating/Feeding: Chair Grooming: Wash/dry hands;Teeth care;Min guard Where Assessed - Grooming: Supported standing Upper Body Dressing: Minimal assistance Where Assessed - Upper Body Dressing: Supported sitting Lower Body Dressing: Minimal assistance Where Assessed - Lower Body Dressing: Supported sit to Lobbyist: Magazine features editor Method: Sit to Loss adjuster, chartered: Comfort height toilet;Grab bars Toileting - Water quality scientist and Hygiene: Min guard Where Assessed - Best boy and Hygiene: Standing;Sit on 3-in-1 or toilet Tub/Shower Transfer Method: Not assessed Equipment Used: Gait belt;Rolling walker Transfers/Ambulation Related to ADLs: Min A for ambulation and Min guard  for transfers-walked with and without walker. ADL Comments: Recommended sitting for dressing and also sitting on chair for bathing due to decreased balance. Told pt to continue to use hands for fine motor activities. Educated on no quick head movements.    OT Diagnosis: Acute pain;Generalized weakness  OT Problem List: Decreased strength;Decreased activity tolerance;Impaired balance (sitting and/or standing);Decreased coordination;Decreased knowledge of use of DME or AE;Decreased knowledge of precautions;Pain;Impaired UE functional use OT Treatment Interventions: Self-care/ADL training;Therapeutic exercise;DME and/or AE instruction;Therapeutic activities;Patient/family education;Balance training   OT Goals(Current goals can be found in the care plan section) Acute Rehab OT Goals Patient Stated Goal: not stated OT Goal Formulation: With patient Time For Goal Achievement: 12/18/13 Potential to Achieve Goals: Good ADL Goals Pt Will Perform Lower Body Bathing: with modified independence;sit to/from stand Pt Will Perform Lower Body Dressing: with modified independence;sit to/from stand Pt Will Transfer to Toilet: with modified independence;ambulating Pt Will Perform Toileting - Clothing Manipulation and hygiene: with modified independence;sit to/from stand  Visit Information  Last OT Received On: 12/11/13 Assistance Needed: +1 History of Present Illness: pt presents with fall down 17 stairs resulting in L Parietal Skull fx with R Fontal Intraparenchymal Hemorrhage, Bil Frontal and R Temporal SAH, and possible BPPV.         Prior [place] [address] [place] expects to be discharged to:: Private residence Living Arrangements: Spouse/significant other Available Help at Discharge: Family;Available PRN/intermittently Type of Home: House Home Access: Stairs to enter [employer] of Steps: 3 Entrance Stairs-Rails: None Home Layout: Two level Alternate Level  Stairs-Number of Steps: flight Alternate Level Stairs-Rails: Right;Left;Can reach both Additional Comments: Per pt she is alone during the day and family is unable  to provide 24hr A.   Prior Function Level of Independence: Needs assistance ADL's / Homemaking Assistance Needed: pt indicates family performs homemaking.   Communication Communication: No difficulties         Vision/Perception Vision - History Baseline Vision: Wears glasses only for reading Patient Visual Report: No change from baseline   Cognition  Cognition Arousal/Alertness: Awake/alert Behavior During Therapy: WFL for tasks assessed/performed Overall Cognitive Status: Within Functional Limits for tasks assessed (however one day off on day of week)    Extremity/Trunk Assessment Upper Extremity Assessment Upper Extremity Assessment: RUE deficits/detail;LUE deficits/detail RUE Deficits / Details: approximately 90 degrees AROM shoulder flexion against gravity RUE Coordination: decreased fine motor;decreased gross motor LUE Deficits / Details: approximately 90 degrees AROM shoulder flexion against gravity LUE Coordination: decreased fine motor Lower Extremity Assessment Lower Extremity Assessment: Defer to PT evaluation RLE Coordination: decreased fine motor;decreased gross motor LLE Coordination: decreased fine motor;decreased gross motor     Mobility Bed Mobility Overal bed mobility: Needs Assistance Bed Mobility: Sit to Supine Sit to supine: Supervision General bed mobility comments: supervision for safety. cues to be sure to not perform quick head movements. Transfers Overall transfer level: Needs assistance Equipment used: Rolling walker (2 wheeled) Transfers: Sit to/from Stand Sit to Stand: Min guard General transfer comment: cues for technique.     Exercise        End of Session OT - End of Session Equipment Utilized During Treatment: Gait belt;Rolling walker Activity Tolerance: Patient  tolerated treatment well Patient left: in bed;with call bell/phone within reach;with bed alarm set Nurse Communication: Other (comment) (pain in back and head)  GO     Benito Mccreedy OTR/L [phone] 12/11/2013, 4:58 PM

## 2013-12-11 NOTE — Evaluation (Signed)
Physical Therapy Evaluation Patient Details Name: Claudia Perez MRN: [id number] DOB: 1951/09/03 Today's Date: 12/11/2013 Time: 5621-3086 PT Time Calculation (min): 22 min  PT Assessment / Plan / Recommendation History of Present Illness  pt presents with fall down 17 stairs resulting in L Parietal Skull fx with R Fontal Intraparenchymal Hemorrhage, Bil Frontal and R Temporal SAH, and possible BPPV.    Clinical Impression  Pt very motivated to improve mobility to return to baseline.  Pt is generally unsteady and would benefit from CIR at D/C to maximize independence prior to returning to home.  Pt does endorse dizziness lasting 1-14mins with position changes.  Called Dr Hirsch's office to get clearance for performing Hallpike and VOR, with his RN calling back to states Dr Luiz Ochoa would like [place] to hold off on performing these movements with pt's skull fx and multiple bleeds.  Pt will need further vestibular f/u once ok'd with NeuroSurg.      PT Assessment  Patient needs continued PT services    Follow Up Recommendations  CIR    Does the patient have the potential to tolerate intense rehabilitation      Barriers to Discharge        Equipment Recommendations  Rolling walker with 5" wheels    Recommendations for Other Services Rehab consult   Frequency Min 3X/week    Precautions / Restrictions Precautions Precautions: Fall Precaution Comments: Per Dr Renold Genta RN no quick head movements or "dangling her head off of bed".   Restrictions Weight Bearing Restrictions: No   Pertinent Vitals/Pain Indicates headache.        Mobility  Bed Mobility Overal bed mobility: Needs Assistance Bed Mobility: Supine to Sit Supine to sit: Min assist;HOB elevated General bed mobility comments: pt needs A to bring trunk up to sitting.   Transfers Overall transfer level: Needs assistance Equipment used: None Transfers: Sit to/from Stand Sit to Stand: Min assist General transfer comment: cues for  UE use and controlling descent to sitting.   Ambulation/Gait Ambulation/Gait assistance: Min assist Ambulation Distance (Feet): 150 Feet Assistive device: 1 person hand held assist Gait Pattern/deviations: Step-through pattern;Decreased stride length;Staggering left;Staggering right General Gait Details: pt generally unsteady and requires consistent MinA with occasional ModA to maintain balance.  pt tends to stagger laterally bil.      Exercises     PT Diagnosis: Difficulty walking  PT Problem List: Decreased activity tolerance;Decreased balance;Decreased mobility;Decreased coordination;Decreased knowledge of use of DME PT Treatment Interventions: DME instruction;Gait training;Stair training;Functional mobility training;Therapeutic activities;Therapeutic exercise;Balance training;Neuromuscular re-education;Patient/family education     PT Goals(Current goals can be found in the care plan section) Acute Rehab PT Goals Patient Stated Goal: Walk better PT Goal Formulation: With patient Time For Goal Achievement: 12/25/13 Potential to Achieve Goals: Good  Visit Information  Last PT Received On: 12/11/13 Assistance Needed: +1 History of Present Illness: pt presents with fall down 17 stairs resulting in L Parietal Skull fx with R Fontal Intraparenchymal Hemorrhage, Bil Frontal and R Temporal SAH, and possible BPPV.         Prior [place] expects to be discharged to:: Inpatient rehab Living Arrangements: Spouse/significant other Additional Comments: Per pt she is alone during the day and family is unable to provide 24hr A.   Prior Function Level of Independence: Needs assistance ADL's / Homemaking Assistance Needed: pt indicates family performs homemaking.   Communication Communication: No difficulties    Cognition  Cognition Arousal/Alertness: Awake/alert Behavior During Therapy: WFL for tasks assessed/performed Overall  Cognitive Status: Within  Functional Limits for tasks assessed    Extremity/Trunk Assessment Upper Extremity Assessment Upper Extremity Assessment: Defer to OT evaluation Lower Extremity Assessment Lower Extremity Assessment: Generalized weakness;RLE deficits/detail;LLE deficits/detail RLE Coordination: decreased fine motor;decreased gross motor LLE Coordination: decreased fine motor;decreased gross motor   Balance Balance Overall balance assessment: Needs assistance Standing balance support: No upper extremity supported Standing balance-Leahy Scale: Poor  End of Session PT - End of Session Equipment Utilized During Treatment: Gait belt Activity Tolerance: Patient tolerated treatment well Patient left: in chair;with call bell/phone within reach Nurse Communication: Mobility status  GP     Lovinia Snare F 12/11/2013, 3:29 PM

## 2013-12-11 NOTE — Progress Notes (Signed)
Rehab Admissions Coordinator Note:  Patient was screened by Cleatrice Burke for appropriateness for an Inpatient Acute Rehab Consult.  At this time, we are recommending Inpatient Rehab consult.  Cleatrice Burke 12/11/2013, 9:06 PM  I can be reached at [phone].

## 2013-12-12 DIAGNOSIS — W108XXA Fall (on) (from) other stairs and steps, initial encounter: Secondary | ICD-10-CM

## 2013-12-12 DIAGNOSIS — S069X9A Unspecified intracranial injury with loss of consciousness of unspecified duration, initial encounter: Secondary | ICD-10-CM

## 2013-12-12 DIAGNOSIS — S069XAA Unspecified intracranial injury with loss of consciousness status unknown, initial encounter: Secondary | ICD-10-CM

## 2013-12-12 MED ORDER — BISACODYL 10 MG RE SUPP: 10.0000 mg | Freq: Every day | RECTAL | Status: DC | PRN

## 2013-12-12 MED ORDER — POLYETHYLENE GLYCOL 3350 17 G PO PACK
17.0000 g | PACK | Freq: Every day | ORAL | Status: DC
  Administered 2013-12-12 – 2013-12-13 (×2): 17 g via ORAL
  Filled 2013-12-12 (×4): qty 1

## 2013-12-12 MED ORDER — DOCUSATE SODIUM 100 MG PO CAPS
100.0000 mg | ORAL_CAPSULE | Freq: Two times a day (BID) | ORAL | Status: DC
  Administered 2013-12-12 – 2013-12-14 (×5): 100 mg via ORAL
  Filled 2013-12-12 (×4): qty 1

## 2013-12-12 NOTE — Progress Notes (Signed)
Subjective: Patient reports np c/o  Objective: Vital signs in last 24 hours: Temp:  [98 F (36.7 C)-99.1 F (37.3 C)] 98 F (36.7 C) (01/13 1000) Pulse Rate:  [76-88] 80 (01/13 1000) Resp:  [18-20] 18 (01/13 1000) BP: (105-133)/(54-80) 107/54 mmHg (01/13 1000) SpO2:  [95 %-100 %] 96 % (01/13 1000)  Intake/Output from previous day:   Intake/Output this shift: Total I/O In: 60 [P.O.:60] Out: -   no change  Lab Results:  Recent Labs  12/09/13 1906 12/10/13 0535  WBC 16.3* 13.6*  HGB 13.2 13.1  HCT 38.1 37.2  PLT 235 232   BMET  Recent Labs  12/09/13 1906 12/10/13 0535  NA 138 137  K 3.8 4.1  CL 101 101  CO2 22 23  GLUCOSE 149* 167*  BUN 16 13  CREATININE 0.67 0.58  CALCIUM 8.5 8.8    Studies/Results: Ct Head Wo Contrast  12/11/2013   CLINICAL DATA:  Traumatic brain injury.  EXAM: CT HEAD WITHOUT CONTRAST  TECHNIQUE: Contiguous axial images were obtained from the base of the skull through the vertex without intravenous contrast.  COMPARISON:  Head CT from 2 days prior  FINDINGS: Skull and Sinuses:No interval displacement of a left parietal bone fracture that is nondepressed. The visible mastoids, middle ears, and paranasal sinuses are aerated. There is a remote left mandibular coronoid process fracture.  Orbits: Bilateral cataract resection.  Brain: Inferior bifrontal and right temporal pole hemorrhagic contusion show generalized increase in hemorrhage and surrounding edema. The previously measured hematoma in the inferior right frontal lobe, is now 20 x 7 mm compared to 15 x 7 mm. Subdural hemorrhage around the frontal cerebral convexities and along the falx is relatively stable. There is scattered subarachnoid hemorrhage which has mildly increased. No hydrocephalus, mass lesion, or shift. Remote parafalcine left parietal infarct.  IMPRESSION: 1. Bifrontal and right temporal hemorrhagic contusions show modest increase from 12/09/2013. 2. Small volume, multi focal  subarachnoid hemorrhage shows mild increase. No hydrocephalus. 3. Thin, multi focal subdural hematoma is without significant increase. 4. Nondepressed left calvarial fracture, unchanged.   Electronically Signed   By: Jorje Guild M.D.   On: 12/11/2013 06:02   Ct Thoracic Spine Wo Contrast  12/11/2013   CLINICAL DATA:  Right upper back pain since a fall down stairs on 12/09/2013.  EXAM: CT THORACIC SPINE WITHOUT CONTRAST  TECHNIQUE: Multidetector CT imaging of the thoracic spine was performed without intravenous contrast administration. Multiplanar CT image reconstructions were also generated.  COMPARISON:  Chest x-ray dated 12/28/2007  FINDINGS: There is no fracture, bone destruction, disc space narrowing, disc bulging or protrusion, foraminal or spinal stenosis, or other significant abnormality. The paraspinal soft tissues are normal. The adjacent visualized portions of the ribs are normal.  IMPRESSION: Normal exam.   Electronically Signed   By: Rozetta Nunnery M.D.   On: 12/11/2013 11:59    Assessment/Plan: Pt with TBI  - improving  - agree with rehab -    LOS: 3 days     Otilio Connors, MD 12/12/2013, 1:36 PM

## 2013-12-12 NOTE — Progress Notes (Signed)
Improving. Tolerated lunch. Working with therapies and I hope she can progress to CIR. Patient examined and I agree with the assessment and plan  Georganna Skeans, MD, MPH, FACS Pager: [phone]  12/12/2013 1:29 PM

## 2013-12-12 NOTE — Progress Notes (Signed)
Physical Therapy Treatment Patient Details Name: Claudia Perez MRN: [id number] DOB: Dec 11, 1950 Today's Date: 12/12/2013 Time: 2951-8841 PT Time Calculation (min): 16 min  PT Assessment / Plan / Recommendation  History of Present Illness pt presents with fall down 17 stairs resulting in L Parietal Skull fx with R Fontal Intraparenchymal Hemorrhage, Bil Frontal and R Temporal SAH, and possible BPPV.     PT Comments   Pt with increased ataxic movements today and increased difficulty with balance during mobility.  Spoke with Dr Luiz Ochoa who states to wait a week from today prior to beginning any vestibular testing to allow more healing time for bleeds.  Will continue to follow.    Follow Up Recommendations  CIR     Does the patient have the potential to tolerate intense rehabilitation     Barriers to Discharge        Equipment Recommendations  Rolling walker with 5" wheels    Recommendations for Other Services Rehab consult  Frequency Min 3X/week   Progress towards PT Goals Progress towards PT goals: Progressing toward goals  Plan Current plan remains appropriate    Precautions / Restrictions Precautions Precautions: Fall Precaution Comments: Spoke with Dr Luiz Ochoa who states no Vestibular testing for a week from today to allow more healing time prior to performing any quick head movements or dangling head off of bed.   Restrictions Weight Bearing Restrictions: No   Pertinent Vitals/Pain Indicates headache earlier today, but resolved now.      Mobility  Bed Mobility Overal bed mobility: Needs Assistance Bed Mobility: Supine to Sit Supine to sit: Min assist;HOB elevated General bed mobility comments: With increased time pt is able to bring LEs out of bed on her own, however needs A to bring trunk up to sitting.   Transfers Overall transfer level: Needs assistance Equipment used: Rolling walker (2 wheeled) Transfers: Sit to/from Stand Sit to Stand: Min guard General transfer  comment: cues for UE use and controlling descent to sitting.  Repeated transfer x5 for strengthening and safe technique.   Ambulation/Gait Ambulation/Gait assistance: Min assist;Mod assist Ambulation Distance (Feet): 150 Feet Assistive device: Rolling walker (2 wheeled) Gait Pattern/deviations: Step-through pattern;Decreased stride length;Ataxic;Drifts right/left;Staggering right General Gait Details: pt seems mroe unsteady to and requires consistent support on R side due to increased R sided lean.  pt with ataxic gait and occasional episodes of ataxic movement in her UEs when managing RW.      Exercises     PT Diagnosis:    PT Problem List:   PT Treatment Interventions:     PT Goals (current goals can now be found in the care plan section) Acute Rehab PT Goals Patient Stated Goal: not stated Time For Goal Achievement: 12/25/13 Potential to Achieve Goals: Good  Visit Information  Last PT Received On: 12/12/13 Assistance Needed: +1 History of Present Illness: pt presents with fall down 17 stairs resulting in L Parietal Skull fx with R Fontal Intraparenchymal Hemorrhage, Bil Frontal and R Temporal SAH, and possible BPPV.      Subjective Data  Patient Stated Goal: not stated   Cognition  Cognition Arousal/Alertness: Awake/alert Behavior During Therapy: WFL for tasks assessed/performed Overall Cognitive Status: Within Functional Limits for tasks assessed    Balance  Balance Overall balance assessment: Needs assistance Standing balance support: Bilateral upper extremity supported Standing balance-Leahy Scale: Fair Standing balance comment: Statically pt is able to maintain balance with RW and S, however dynamically pt requires A to prevent LOB.    End  of Session PT - End of Session Equipment Utilized During Treatment: Gait belt Activity Tolerance: Patient tolerated treatment well Patient left: in chair;with call bell/phone within reach Nurse Communication: Mobility status    GP     Claudia Perez, [place] 12/12/2013, 2:18 PM

## 2013-12-12 NOTE — Consult Note (Signed)
Physical Medicine and Rehabilitation Consult  Reason for Consult: TBI Referring Physician: Dr. Grandville Silos.   HPI: Claudia Perez is a 63 y.o. 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 Fall  unwitnessed but patient reports dizziness prior to fall--no LOC or seizure activity.  CT head with Nondisplaced left parietal skull fracture with large left parietal and occipital scalp hematomas, small SDH, right frontal intraparenchymal hemorrhage as well as SAH in bilateral frontal and right temporal region.Marland KitchenShe was evaluated by Dr. Luiz Ochoa and serial CCT recommended for monitoring. Follow up CCT with modest increase in hemorrhagic contusions. CT thoracic spine and cervical spine negative for fracture.  Patient with complaints of head and neck pain as well as dizziness due to vertigo. Therapy evaluations done yesterday and CIR recommended by MD and therapy team.   Review of Systems  HENT: Negative for hearing loss.   Eyes: Negative for blurred vision and double vision.  Respiratory: Negative for cough and shortness of breath.   Cardiovascular: Negative for chest pain and palpitations.  Gastrointestinal: Negative for nausea and vomiting.  Genitourinary: Negative for dysuria.  Musculoskeletal: Positive for falls (husband reports staggering gait--tends to vear to the right. ), myalgias and neck pain. Negative for back pain.  Neurological: Positive for dizziness (recurrent vertigo), focal weakness (chronic RLE weakness) and headaches.    Past Medical History  Diagnosis Date  . Mobility impaired   . Hypercholesteremia   . Vitamin D deficiency   . Shortness of breath     "even now, just talking" (03/27/2013)  . Hyperthyroidism     "a little bit; don't take RX for it" (03/27/2013)  . GERD (gastroesophageal reflux disease)   . Seizures     "think I might have; been blacking out several times in the  past" (03/27/2013)  . Fall at home 03/27/2013    "lost consciousness; fractured right clavicle" (03/27/2013)   Past Surgical History  Procedure Laterality Date  . Appendectomy  1960's  . Eye surgery  02/2013    cataract extraction w/ intraocular lens implant  . Brain surgery  10/2003    SDH evac   Family History  Problem Relation Age of Onset  . Hypertension Mother    Social History:   Married. Husband works days. 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 She reports that she has never smoked. She has never used smokeless tobacco. She reports that she drinks alcohol--a glass of wine socially.  She reports that she does not use illicit drugs.   Allergies  Allergen Reactions  . Nitrofurantoin Itching  . Sulfamethoxazole-Trimethoprim Itching   Medications Prior to Admission  Medication Sig Dispense Refill  . Multiple Vitamin (MULTIVITAMIN WITH MINERALS) TABS Take 1 tablet by mouth daily.        Home: Home Living Family/patient expects to be discharged to:: Private residence Living Arrangements: Spouse/significant other Available Help at Discharge: Family;Available PRN/intermittently Type of Home: House Home Access: Stairs to enter Entrance Stairs-Number of Steps: 3 Entrance Stairs-Rails: None Home Layout: Two level Alternate Level Stairs-Number of Steps: flight Alternate Level Stairs-Rails: Right;Left;Can reach both Additional Comments: Per pt she is alone during the day and family is unable to provide 24hr A.    Functional History:   Functional Status:  Mobility:     Ambulation/Gait Ambulation Distance (Feet): 150 Feet General Gait Details: pt generally unsteady and requires consistent MinA with occasional ModA  to maintain balance.  pt tends to stagger laterally bil.      ADL: ADL Eating/Feeding: Set up Where Assessed - Eating/Feeding: Chair Grooming: Wash/dry hands;Teeth care;Min guard Where Assessed - Grooming: Supported  standing Upper Body Dressing: Minimal assistance Where Assessed - Upper Body Dressing: Supported sitting Lower Body Dressing: Minimal assistance Where Assessed - Lower Body Dressing: Supported sit to Lobbyist: Magazine features editor Method: Sit to Loss adjuster, chartered: Comfort height toilet;Grab bars Tub/Shower Transfer Method: Not assessed Equipment Used: Gait belt;Rolling walker Transfers/Ambulation Related to ADLs: Min A for ambulation and Min guard for transfers. ADL Comments: Recommended sitting for dressing and also sitting on chair for bathing due to decreased balance. Told pt to continue to use hands for fine motor activities.  Cognition: Cognition Overall Cognitive Status: Within Functional Limits for tasks assessed (however one day off on day of week) Orientation Level: Oriented X4 Cognition Arousal/Alertness: Awake/alert Behavior During Therapy: WFL for tasks assessed/performed Overall Cognitive Status: Within Functional Limits for tasks assessed (however one day off on day of week)  Blood pressure 108/71, pulse 84, temperature 98.9 F (37.2 C), temperature source Oral, resp. rate 18, height 5\' 1"  (1.549 m), weight 49.9 kg (110 lb 0.2 oz), SpO2 97.00%. Physical Exam  Nursing note and vitals reviewed. Constitutional: She is oriented to person, place, and time. She appears well-developed and well-nourished.  HENT:  Head: Normocephalic.  Eyes: Conjunctivae are normal. Pupils are equal, round, and reactive to light.  Neck: Normal range of motion.  Cardiovascular: Normal rate and regular rhythm.   Respiratory: Effort normal and breath sounds normal. No respiratory distress. She has no wheezes.  GI: Soft. Bowel sounds are normal. She exhibits no distension. There is no tenderness.  Musculoskeletal: She exhibits no edema and no tenderness.  Neurological: She is alert and oriented to person, place, and time.  Ataxic, halting speech. Follows basic  commands without difficulty. Good recall with fair insight and awareness.  Dysmetria LUE>RUE. Decrease in fine motor movements of BLE as well. Bilateral limb ataxia noted.   Skin: Skin is warm and dry.  Psychiatric: She has a normal mood and affect. Her behavior is normal.    No results found for this or any previous visit (from the past 24 hour(s)). Ct Head Wo Contrast  12/11/2013   CLINICAL DATA:  Traumatic brain injury.  EXAM: CT HEAD WITHOUT CONTRAST  TECHNIQUE: Contiguous axial images were obtained from the base of the skull through the vertex without intravenous contrast.  COMPARISON:  Head CT from 2 days prior  FINDINGS: Skull and Sinuses:No interval displacement of a left parietal bone fracture that is nondepressed. The visible mastoids, middle ears, and paranasal sinuses are aerated. There is a remote left mandibular coronoid process fracture.  Orbits: Bilateral cataract resection.  Brain: Inferior bifrontal and right temporal pole hemorrhagic contusion show generalized increase in hemorrhage and surrounding edema. The previously measured hematoma in the inferior right frontal lobe, is now 20 x 7 mm compared to 15 x 7 mm. Subdural hemorrhage around the frontal cerebral convexities and along the falx is relatively stable. There is scattered subarachnoid hemorrhage which has mildly increased. No hydrocephalus, mass lesion, or shift. Remote parafalcine left parietal infarct.  IMPRESSION: 1. Bifrontal and right temporal hemorrhagic contusions show modest increase from 12/09/2013. 2. Small volume, multi focal subarachnoid hemorrhage shows mild increase. No hydrocephalus. 3. Thin, multi focal subdural hematoma is without significant increase. 4. Nondepressed left calvarial fracture, unchanged.   Electronically Signed  By: Jorje Guild M.D.   On: 12/11/2013 06:02   Ct Thoracic Spine Wo Contrast  12/11/2013   CLINICAL DATA:  Right upper back pain since a fall down stairs on 12/09/2013.  EXAM: CT  THORACIC SPINE WITHOUT CONTRAST  TECHNIQUE: Multidetector CT imaging of the thoracic spine was performed without intravenous contrast administration. Multiplanar CT image reconstructions were also generated.  COMPARISON:  Chest x-ray dated 12/28/2007  FINDINGS: There is no fracture, bone destruction, disc space narrowing, disc bulging or protrusion, foraminal or spinal stenosis, or other significant abnormality. The paraspinal soft tissues are normal. The adjacent visualized portions of the ribs are normal.  IMPRESSION: Normal exam.   Electronically Signed   By: Rozetta Nunnery M.D.   On: 12/11/2013 11:59    Assessment/Plan: Diagnosis: TBI/skull fx after a fall. Has hx of prior TBI 1. Does the need for close, 24 hr/day medical supervision in concert with the patient's rehab needs make it unreasonable for this patient to be served in a less intensive setting? Yes 2. Co-Morbidities requiring supervision/potential complications: old R clavicular fx 3. Due to bladder management, bowel management, safety, skin/wound care, disease management, medication administration, pain management and patient education, does the patient require 24 hr/day rehab nursing? Yes 4. Does the patient require coordinated care of a physician, rehab nurse, PT (1-2 hrs/day, 5 days/week), OT (1-2 hrs/day, 5 days/week) and SLP (1-2 hrs/day, 5 days/week) to address physical and functional deficits in the context of the above medical diagnosis(es)? Yes Addressing deficits in the following areas: balance, endurance, locomotion, strength, transferring, bowel/bladder control, bathing, dressing, feeding, grooming, toileting, cognition, speech, swallowing and psychosocial support 5. Can the patient actively participate in an intensive therapy program of at least 3 hrs of therapy per day at least 5 days per week? Yes 6. The potential for patient to make measurable gains while on inpatient rehab is excellent 7. Anticipated functional outcomes upon  discharge from inpatient rehab are supervision to mod I with PT, supervision to mod I with OT, supervision to mod I with SLP. 8. Estimated rehab length of stay to reach the above functional goals is: 7-9 days 9. Does the patient have adequate social supports to accommodate these discharge functional goals? Yes and Potentially 10. Anticipated D/C setting: Home 11. Anticipated post D/C treatments: [place] therapy 12. Overall Rehab/Functional Prognosis: excellent  RECOMMENDATIONS: This patient's condition is appropriate for continued rehabilitative care in the following setting: CIR Patient has agreed to participate in recommended program. Yes Note that insurance prior authorization may be required for reimbursement for recommended care.  Comment: Rehab Admissions Coordinator to follow up.  Thanks,  Meredith Staggers, MD, Mellody Drown     12/12/2013

## 2013-12-12 NOTE — Evaluation (Signed)
Speech Language Pathology Evaluation Patient Details Name: Claudia Perez MRN: [id number] DOB: Feb 24, 1951 Today's Date: 12/12/2013 Time: 6440-3474 SLP Time Calculation (min): 15 min  Problem List:  Patient Active Problem List   Diagnosis Date Noted  . Fall down stairs 12/11/2013  . Basilar skull fracture 12/11/2013  . Traumatic intracranial hemorrhage 12/11/2013  . Back pain 12/11/2013  . Subarachnoid hemorrhage following injury 12/09/2013  . Syncope 03/27/2013  . Clavicular fracture 03/27/2013  . COUGH 12/28/2007   Past Medical History:  Past Medical History  Diagnosis Date  . Mobility impaired   . Hypercholesteremia   . Vitamin D deficiency   . Shortness of breath     "even now, just talking" (03/27/2013)  . Hyperthyroidism     "a little bit; don't take RX for it" (03/27/2013)  . GERD (gastroesophageal reflux disease)   . Seizures     "think I might have; been blacking out several times in the past" (03/27/2013)  . Fall at home 03/27/2013    "lost consciousness; fractured right clavicle" (03/27/2013)   Past Surgical History:  Past Surgical History  Procedure Laterality Date  . Appendectomy  1960's  . Eye surgery  02/2013    cataract extraction w/ intraocular lens implant  . Brain surgery  10/2003    SDH evac   HPI:  Pt presents with fall down 17 stairs resulting in L Parietal Skull fx with R Frontal Intraparenchymal Hemorrhage, Bil Frontal and R Temporal SAH, and possible BPPV.  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 She also admits to mild memory and word finding deficits. If pt to attend CIR for physical deficits, SLP is recommended to address ongoing deficits. Will defer further treatment to next setting.     SLP  Assessment  All further Speech Lanaguage Pathology  needs can be addressed in the next venue of care    Follow Up Recommendations  Inpatient Rehab    Frequency and Duration        Pertinent Vitals/Pain NA   SLP Goals     SLP Evaluation Prior Functioning  Cognitive/Linguistic Baseline: Baseline deficits Baseline deficit details: Dysarthria, voice disorder Type of Home: House  Lives With: Spouse;Family Available Help at Discharge: Family;Available PRN/intermittently Vocation: On disability   Cognition  Overall Cognitive Status: History of cognitive impairments - at baseline Arousal/Alertness: Awake/alert Orientation Level: Oriented X4 Attention: Focused;Sustained;Selective;Alternating Focused Attention: Appears intact Sustained Attention: Appears intact Selective Attention: Appears intact Alternating Attention: Appears intact Memory: Appears intact Awareness: Appears intact Problem Solving: Appears intact    Comprehension  Auditory Comprehension Overall Auditory Comprehension: Appears within functional limits for tasks assessed    Expression Verbal Expression Overall Verbal Expression: Impaired at baseline Initiation: No impairment Automatic Speech: Name;Social Response Level of Generative/Spontaneous Verbalization: Conversation Repetition: No impairment Naming: Impairment (some word finding difficulty in conversation) Pragmatics: No impairment   Oral / Motor Oral Motor/Sensory Function Overall Oral Motor/Sensory Function: Appears within functional limits for tasks assessed Motor Speech Overall Motor Speech: Impaired Respiration: Impaired Level of Impairment: Sentence Phonation:  (difficutly regulating pitch) Resonance: Within functional limits Articulation: Impaired Level of Impairment: Phrase Intelligibility: Intelligibility reduced Word: 75-100% accurate Phrase: 50-74% accurate Sentence: 50-74% accurate Conversation: 50-74% accurate Motor Planning:  Impaired Level of Impairment: Insurance risk surveyor Errors: Aware   GO  Herbie [place], [place] CCC-SLP [phone]  Lynann Beaver 12/12/2013, 3:22 PM

## 2013-12-12 NOTE — Progress Notes (Signed)
Patient ID: Claudia Perez, female   DOB: May 27, 1951, 63 y.o.   MRN: [id number]  LOS: 3 days   Subjective: Pt still c/o of HA and vertigo but better than yesterday.  Also c/o of some back pain which is improving. No other complaints of pain.  No BM. Urinating well.  Tolerating diet.    Objective: Vital signs in last 24 hours: Temp:  [97.8 F (36.6 C)-99.1 F (37.3 C)] 99 F (37.2 C) (01/13 0608) Pulse Rate:  [76-88] 80 (01/13 0608) Resp:  [18-20] 18 (01/13 0608) BP: (105-133)/(65-80) 110/68 mmHg (01/13 0608) SpO2:  [95 %-100 %] 98 % (01/13 0608) Last BM Date: 12/08/13  Imaging: Ct Thoracic Spine Wo Contrast  12/11/2013   CLINICAL DATA:  Right upper back pain since a fall down stairs on 12/09/2013.  EXAM: CT THORACIC SPINE WITHOUT CONTRAST  TECHNIQUE: Multidetector CT imaging of the thoracic spine was performed without intravenous contrast administration. Multiplanar CT image reconstructions were also generated.  COMPARISON:  Chest x-ray dated 12/28/2007  FINDINGS: There is no fracture, bone destruction, disc space narrowing, disc bulging or protrusion, foraminal or spinal stenosis, or other significant abnormality. The paraspinal soft tissues are normal. The adjacent visualized portions of the ribs are normal.  IMPRESSION: Normal exam.   Electronically Signed   By: Rozetta Nunnery M.D.   On: 12/11/2013 11:59     PE:  General: pleasant, WD/WN black female who is laying in bed in NAD  HEENT:  Sclera are noninjected. Ears and nose without any masses or lesions. Mouth is pink and moist  Heart: regular, rate, and rhythm. Normal s1,s2. No obvious murmurs, gallops, or rubs noted. Palpable radial and pedal pulses bilaterally  Lungs: CTAB, no wheezes, rhonchi, or rales noted. Respiratory effort nonlabored  Abd: soft, NT/ND, +BS, masses, hernias, or organomegaly  MS:  CSM intact  Skin: warm and dry with no masses, lesions, or rashes  Neuro: Alert and oriented x 3   Assessment/Plan:  Fall  Left  parietal skull fracture, small anterior subdural hematoma, right front intraparenchymal hemorrhage, bilateral frontal and right temporal SAH -- TBI Team. Back pain -- CT t-spine negative. Most likely sprain/bruising from fall. Vertigo -- Suspect BPPV, Dr. Consuello Masse recommending holding off on performing Hallpike and VOR with pt's skull fx and multiple bleeds. Vestibular follow up needed with Neuro's ok. FEN - No Issues. VTE - SCD's  Dispo -- Pt is motivated to be discharged. Neuro recommends increasing activity.  PT/OT recommend CIR.    Donald Siva Karolee Stamps  General Trauma PA Pager: [phone]   12/12/2013

## 2013-12-13 NOTE — Progress Notes (Signed)
Physical Therapy Treatment Patient Details Name: Claudia Perez MRN: [id number] DOB: 05-02-1951 Today's Date: 12/13/2013 Time: 6468-0321 PT Time Calculation (min): 28 min  PT Assessment / Plan / Recommendation  History of Present Illness pt presents with fall down 17 stairs resulting in L Parietal Skull fx with R Fontal Intraparenchymal Hemorrhage, Bil Frontal and R Temporal SAH, and possible BPPV.     PT Comments   Progressing well and motivated to work with therapy. Continues with ataxic gait and standing balance. Continue to recommend comprehensive inpatient rehab (CIR) for post-acute therapy needs.   Follow Up Recommendations  CIR     Does the patient have the potential to tolerate intense rehabilitation     Barriers to Discharge        Equipment Recommendations  Rolling walker with 5" wheels    Recommendations for Other Services    Frequency Min 3X/week   Progress towards PT Goals Progress towards PT goals: Progressing toward goals  Plan Current plan remains appropriate    Precautions / Restrictions Precautions Precautions: Fall Restrictions Weight Bearing Restrictions: No   Pertinent Vitals/Pain no apparent distress     Mobility  Bed Mobility Bed Mobility: Supine to Sit Supine to sit: Min assist Transfers Equipment used: Rolling walker (2 wheeled) Sit to Stand: Min guard General transfer comment: Cues for technique. Stood x10 for strengthening and technique Ambulation/Gait Ambulation/Gait assistance: Min Wellsite geologist (Feet): 150 Feet Assistive device: Rolling walker (2 wheeled) Gait Pattern/deviations: Step-through pattern;Ataxic;Staggering right General Gait Details: Continues to have ataxic gait with right side lean. Cues for posture and for LE placement     Exercises     PT Diagnosis:    PT Problem List:   PT Treatment Interventions:     PT Goals (current goals can now be found in the care plan section)    Visit Information  Last PT  Received On: 12/13/13 Assistance Needed: +1 History of Present Illness: pt presents with fall down 17 stairs resulting in L Parietal Skull fx with R Fontal Intraparenchymal Hemorrhage, Bil Frontal and R Temporal SAH, and possible BPPV.      Subjective Data      Cognition  Cognition Arousal/Alertness: Awake/alert Behavior During Therapy: WFL for tasks assessed/performed Overall Cognitive Status: History of cognitive impairments - at baseline    Balance  Balance Standing balance-Leahy Scale: Fair  End of Session PT - End of Session Equipment Utilized During Treatment: Gait belt Activity Tolerance: Patient tolerated treatment well Patient left: in chair;with call bell/phone within reach Nurse Communication: Mobility status   GP     Jacqualyn Posey 12/13/2013, 9:04 AM 12/13/2013 Jacqualyn Posey PTA [phone] pager [phone] office

## 2013-12-13 NOTE — Progress Notes (Signed)
Patient ID: Claudia Perez, female   DOB: [birth year], 63 y.o.   MRN: [id number]   LOS: 4 days   Subjective: Pt still c/o of HA and vertigo with movement which is improving.  Back pain is also improving.  Today, she c/o of feeling tired when getting up to use the bathroom but she does not get short of breath.  Pt states that she has low appetite but forces herself to eat.  Normal BM last night. Urinating well.  Objective: Vital signs in last 24 hours: Temp:  [97.7 F (36.5 C)-98.5 F (36.9 C)] 98.2 F (36.8 C) (01/14 0529) Pulse Rate:  [73-86] 73 (01/14 0529) Resp:  [16-20] 18 (01/14 0529) BP: (96-140)/(54-66) 137/57 mmHg (01/14 0529) SpO2:  [96 %-99 %] 97 % (01/14 0529) Last BM Date: 12/12/13   PE: General: pleasant, WD/WN black female who is sitting up in NAD  HEENT: Sclera are noninjected. Ears and nose without any masses or lesions. Mouth is pink and moist  Heart: regular, rate, and rhythm. Normal s1,s2. No obvious murmurs, gallops, or rubs noted. Palpable radial and pedal pulses bilaterally  Lungs: CTAB, no wheezes, rhonchi, or rales noted. Respiratory effort nonlabored  Abd: soft, NT/ND, +BS, masses, hernias, or organomegaly  MS: pain with palpation to lower back paraspinal, CSM intact  Skin: warm and dry with no masses, lesions, or rashes  Neuro: Alert and oriented x 3   Assessment/Plan: Fall  Left parietal skull fracture, small anterior subdural hematoma, right front intraparenchymal hemorrhage, bilateral frontal and right temporal SAH -- TBI Team.  Back pain -- Most likely sprain/bruising from fall.  Vertigo -- Suspect BPPV, neuro recommends holding off on Hallpike and VOR due to pt's skull fx and multiple bleeds. Vestibular follow up needed with Neuro's ok.  FEN - Regular Diet.  VTE - SCD's  Dispo -- TBI is improving as per Neuro and pt is ready to be discharged to CIR.    Donald Siva Karolee Stamps  General Trauma PA Pager: [phone]   12/13/2013

## 2013-12-13 NOTE — Progress Notes (Addendum)
UR completed. Await CIR.  Sandi Mariscal, RN BSN [place] CCM Trauma/Neuro ICU Case Manager [phone]

## 2013-12-13 NOTE — Progress Notes (Signed)
Subjective: Patient reports no c/o,   Objective: Vital signs in last 24 hours: Temp:  [97.7 F (36.5 C)-98.5 F (36.9 C)] 97.8 F (36.6 C) (01/14 1043) Pulse Rate:  [67-86] 67 (01/14 1043) Resp:  [16-20] 18 (01/14 1043) BP: (96-140)/(56-66) 114/57 mmHg (01/14 1043) SpO2:  [96 %-99 %] 98 % (01/14 1043)  Intake/Output from previous day: 01/13 0701 - 01/14 0700 In: 480 [P.O.:480] Out: -  Intake/Output this shift:    neuro   - stable  Lab Results: No results found for this basename: WBC, HGB, HCT, PLT,  in the last 72 hours BMET No results found for this basename: NA, K, CL, CO2, GLUCOSE, BUN, CREATININE, CALCIUM,  in the last 72 hours  Studies/Results: Ct Thoracic Spine Wo Contrast  12/11/2013   CLINICAL DATA:  Right upper back pain since a fall down stairs on 12/09/2013.  EXAM: CT THORACIC SPINE WITHOUT CONTRAST  TECHNIQUE: Multidetector CT imaging of the thoracic spine was performed without intravenous contrast administration. Multiplanar CT image reconstructions were also generated.  COMPARISON:  Chest x-ray dated 12/28/2007  FINDINGS: There is no fracture, bone destruction, disc space narrowing, disc bulging or protrusion, foraminal or spinal stenosis, or other significant abnormality. The paraspinal soft tissues are normal. The adjacent visualized portions of the ribs are normal.  IMPRESSION: Normal exam.   Electronically Signed   By: Rozetta Nunnery M.D.   On: 12/11/2013 11:59    Assessment/Plan: Pt stable, agree rehab   - CT head in few weeks   LOS: 4 days     Ruhani Umland R, MD 12/13/2013, 10:53 AM

## 2013-12-13 NOTE — Progress Notes (Signed)
   12/13/13 1637  OT Visit Information  Last OT Received On 12/13/13  Assistance Needed +1  History of Present Illness pt presents with fall down 17 stairs resulting in L Parietal Skull fx with R Fontal Intraparenchymal Hemorrhage, Bil Frontal and R Temporal SAH, and possible BPPV.    OT Time Calculation  OT Start Time 1553  OT Stop Time 1624  OT Time Calculation (min) 31 min  Precautions  Precautions Fall  Restrictions  Weight Bearing Restrictions No  ADL  Grooming Wash/dry face;Min guard  Where Assessed - Grooming Supported standing  Lower Body Bathing Min guard  Where Assessed - Lower Body Bathing Supported sit to stand  Lower Body Dressing Minimal assistance  Where Assessed - Lower Body Dressing Supported sit to Development worker, community Method Sit to Surveyor, quantity Comfort height toilet;Grab bars  Toileting - Water quality scientist and Hygiene Min guard  Where Assessed - Best boy and Hygiene Standing  Equipment Used Gait belt;Rolling walker  Transfers/Ambulation Related to ADLs Min A for ambulation. Min guard for transfers. Cues for technique with gait.  ADL Comments Pt performed LB bathing and dressing. Educated on dressing technique and to sit for bathing and dressing. Ambulated in hallway. Educated on fine motor activities to be doing.  Cognition  Arousal/Alertness Awake/alert  Behavior During Therapy WFL for tasks assessed/performed  Overall Cognitive Status Within Functional Limits for tasks assessed (however did forget what OT asked her to go retrieve for bath)  Bed Mobility  Bed Mobility Supine to Sit  Supine to Sit 4: Min assist  Details for Bed Mobility Assistance assisted with trunk.  Transfers  Transfers Sit to Stand;Stand to Sit  Sit to Stand 4: Min guard;With upper extremity assist;From bed;From toilet;From chair/3-in-1  Stand to Sit 4: Min guard;With upper extremity assist;To chair/3-in-1;To  toilet  Details for Transfer Assistance cues for hand placement.  OT - End of Session  Equipment Utilized During Treatment Gait belt;Rolling walker  Activity Tolerance Patient tolerated treatment well  Patient left in chair;with call bell/phone within reach;with family/visitor present  Nurse Communication Patient requests pain meds  OT Assessment/Plan  OT Plan Discharge plan remains appropriate  OT Frequency Min 2X/week  Follow Up Recommendations CIR  OT Equipment Other (comment) (defer to next venue)  OT Goal Progression  Progress towards OT goals Not progressing toward goals - comment (at same level today)  Acute Rehab OT Goals  Patient Stated Goal not stated  OT Goal Formulation With patient  Time For Goal Achievement 12/18/13  Potential to Achieve Goals Good  ADL Goals  Pt Will Perform Grooming with modified independence;standing  Pt Will Perform Lower Body Bathing with modified independence;sit to/from stand  Pt Will Perform Lower Body Dressing with modified independence;sit to/from stand  Pt Will Transfer to Toilet with modified independence;ambulating  Pt Will Perform Toileting - Clothing Manipulation and hygiene with modified independence;sit to/from stand  OT General Charges  $OT Visit 1 Procedure  OT Treatments  $Self Care/Home Management  8-22 mins  $Therapeutic Activity 8-22 mins    Pain 6/10 headache. Nurse notified.  [name], OTR/L [phone]

## 2013-12-13 NOTE — Progress Notes (Signed)
The patient's husband is veryb concerned about the patient going any place away from this campus.n I assured him that Rehab was in the same physical plant.  She can to to CIR at anytime.  This patient has been seen and I agree with the findings and treatment plan.  Kathryne Eriksson. Dahlia Bailiff, MD, [place] [phone] (pager) [phone] (direct pager) Trauma Surgeon

## 2013-12-14 ENCOUNTER — Inpatient Hospital Stay (HOSPITAL_COMMUNITY)
Admission: RE | Admit: 2013-12-14 | Discharge: 2013-12-29 | DRG: 945 | Disposition: A | Discharge status: Skilled Nursing Facility | Payer: Medicare Other | Source: Intra-hospital | Attending: Physical Medicine & Rehabilitation | Admitting: Physical Medicine & Rehabilitation

## 2013-12-14 DIAGNOSIS — G40909 Epilepsy, unspecified, not intractable, without status epilepticus: Secondary | ICD-10-CM | POA: Diagnosis not present

## 2013-12-14 DIAGNOSIS — R279 Unspecified lack of coordination: Secondary | ICD-10-CM | POA: Diagnosis not present

## 2013-12-14 DIAGNOSIS — E559 Vitamin D deficiency, unspecified: Secondary | ICD-10-CM | POA: Diagnosis present

## 2013-12-14 DIAGNOSIS — W19XXXA Unspecified fall, initial encounter: Secondary | ICD-10-CM | POA: Diagnosis present

## 2013-12-14 DIAGNOSIS — E7800 Pure hypercholesterolemia, unspecified: Secondary | ICD-10-CM | POA: Diagnosis present

## 2013-12-14 DIAGNOSIS — S069XAA Unspecified intracranial injury with loss of consciousness status unknown, initial encounter: Secondary | ICD-10-CM | POA: Diagnosis present

## 2013-12-14 DIAGNOSIS — R0609 Other forms of dyspnea: Secondary | ICD-10-CM | POA: Diagnosis not present

## 2013-12-14 DIAGNOSIS — K219 Gastro-esophageal reflux disease without esophagitis: Secondary | ICD-10-CM | POA: Diagnosis present

## 2013-12-14 DIAGNOSIS — R269 Unspecified abnormalities of gait and mobility: Secondary | ICD-10-CM | POA: Diagnosis not present

## 2013-12-14 DIAGNOSIS — Z9181 History of falling: Secondary | ICD-10-CM

## 2013-12-14 DIAGNOSIS — S069X9A Unspecified intracranial injury with loss of consciousness of unspecified duration, initial encounter: Secondary | ICD-10-CM

## 2013-12-14 DIAGNOSIS — R499 Unspecified voice and resonance disorder: Secondary | ICD-10-CM | POA: Diagnosis not present

## 2013-12-14 DIAGNOSIS — S0280XA Fracture of other specified skull and facial bones, unspecified side, initial encounter for closed fracture: Secondary | ICD-10-CM | POA: Diagnosis present

## 2013-12-14 DIAGNOSIS — Z87820 Personal history of traumatic brain injury: Secondary | ICD-10-CM | POA: Diagnosis not present

## 2013-12-14 DIAGNOSIS — Z5189 Encounter for other specified aftercare: Secondary | ICD-10-CM | POA: Diagnosis not present

## 2013-12-14 DIAGNOSIS — E871 Hypo-osmolality and hyponatremia: Secondary | ICD-10-CM | POA: Diagnosis present

## 2013-12-14 DIAGNOSIS — E0590 Thyrotoxicosis, unspecified without thyrotoxic crisis or storm: Secondary | ICD-10-CM | POA: Diagnosis present

## 2013-12-14 DIAGNOSIS — I6200 Nontraumatic subdural hemorrhage, unspecified: Secondary | ICD-10-CM | POA: Diagnosis not present

## 2013-12-14 DIAGNOSIS — S069X2A Unspecified intracranial injury with loss of consciousness of 31 minutes to 59 minutes, initial encounter: Secondary | ICD-10-CM | POA: Diagnosis present

## 2013-12-14 DIAGNOSIS — R488 Other symbolic dysfunctions: Secondary | ICD-10-CM | POA: Diagnosis not present

## 2013-12-14 MED ORDER — TRAMADOL HCL 50 MG PO TABS
50.0000 mg | ORAL_TABLET | Freq: Four times a day (QID) | ORAL | Status: DC | PRN
  Administered 2013-12-14 – 2013-12-17 (×5): 50 mg via ORAL
  Filled 2013-12-14 (×6): qty 1

## 2013-12-14 MED ORDER — POLYETHYLENE GLYCOL 3350 17 G PO PACK
17.0000 g | PACK | Freq: Every day | ORAL | Status: DC
  Administered 2013-12-15 – 2013-12-20 (×6): 17 g via ORAL
  Filled 2013-12-14 (×16): qty 1

## 2013-12-14 MED ORDER — ACETAMINOPHEN 325 MG PO TABS: 325.0000 mg | ORAL_TABLET | ORAL | Status: DC | PRN

## 2013-12-14 MED ORDER — ONDANSETRON HCL 4 MG PO TABS
4.0000 mg | ORAL_TABLET | Freq: Four times a day (QID) | ORAL | Status: DC | PRN
  Administered 2013-12-15 – 2013-12-17 (×4): 4 mg via ORAL
  Filled 2013-12-14 (×5): qty 1

## 2013-12-14 MED ORDER — METHOCARBAMOL 500 MG PO TABS
500.0000 mg | ORAL_TABLET | Freq: Four times a day (QID) | ORAL | Status: DC | PRN
  Administered 2013-12-17 – 2013-12-20 (×5): 500 mg via ORAL
  Filled 2013-12-14 (×5): qty 1

## 2013-12-14 MED ORDER — DOCUSATE SODIUM 100 MG PO CAPS
100.0000 mg | ORAL_CAPSULE | Freq: Two times a day (BID) | ORAL | Status: DC
  Administered 2013-12-14 – 2013-12-22 (×15): 100 mg via ORAL
  Filled 2013-12-14 (×32): qty 1

## 2013-12-14 MED ORDER — ONDANSETRON HCL 4 MG/2ML IJ SOLN: 4.0000 mg | Freq: Four times a day (QID) | INTRAMUSCULAR | Status: DC | PRN

## 2013-12-14 MED ORDER — BISACODYL 10 MG RE SUPP
10.0000 mg | Freq: Every day | RECTAL | Status: DC | PRN
  Filled 2013-12-14: qty 1

## 2013-12-14 NOTE — Progress Notes (Signed)
I met with pt and her spouse at bedside this am. I have verified that inpt rehab bed is available today and we will make arrangements to admit. Husband to meet me at 70 today to complete paperwork and than we will admit. I have contacted Trauma Service. [phone]

## 2013-12-14 NOTE — H&P (Signed)
Physical Medicine and Rehabilitation Admission H&P  Chief Complaint   Patient presents with   .  Fall   :  Chief complaint: Headache  HPI: Claudia Perez is a 63 y.o. 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 Patient on no scheduled medications prior to admission. Fall unwitnessed but patient reports dizziness prior to fall--no LOC or seizure activity. CT head with Nondisplaced left parietal skull fracture with large left parietal and occipital scalp hematomas, small SDH, right frontal intraparenchymal hemorrhage as well as SAH in bilateral frontal and right temporal region.Marland KitchenShe was evaluated by Dr. Luiz Ochoa and serial CCT recommended for monitoring. Follow up CCT 12/11/2013 with modest increase in hemorrhagic contusions. CT thoracic spine and cervical spine negative for fracture. Patient with complaints of head and neck pain as well as dizziness due to vertigo. Patient is tolerating a regular diet. Physical occupational therapy evaluations completed an ongoing with recommendations of physical medicine rehabilitation consult to consider inpatient rehabilitation services. Patient was felt to be a good candidate was admitted for comprehensive rehabilitation  program   Patient's husband states that she had chronic problems with balance since the prior traumatic brain injury but is worse after this most recent fall  ROS Review of Systems  HENT: Negative for hearing loss.  Eyes: Negative for blurred vision and double vision.  Respiratory: Negative for cough and shortness of breath.  Cardiovascular: Negative for chest pain and palpitations.  Gastrointestinal: Negative for nausea and vomiting.  Genitourinary: Negative for dysuria.  Musculoskeletal: Positive for falls (husband reports staggering gait--tends to vear to the right. ), myalgias and neck pain. Negative for  back pain.  Neurological: Positive for dizziness (recurrent vertigo), focal weakness (chronic RLE weakness) and headaches  All other systems negative  Past Medical History   Diagnosis  Date   .  Mobility impaired    .  Hypercholesteremia    .  Vitamin D deficiency    .  Shortness of breath      "even now, just talking" (03/27/2013)   .  Hyperthyroidism      "a little bit; don't take RX for it" (03/27/2013)   .  GERD (gastroesophageal reflux disease)    .  Seizures      "think I might have; been blacking out several times in the past" (03/27/2013)   .  Fall at home  03/27/2013     "lost consciousness; fractured right clavicle" (03/27/2013)    Past Surgical History   Procedure  Laterality  Date   .  Appendectomy   1960's   .  Eye surgery   02/2013     cataract extraction w/ intraocular lens implant   .  Brain surgery   10/2003     SDH evac    Family History   Problem  Relation  Age of Onset   .  Hypertension  Mother     Social History: reports that she has never smoked. She has never used smokeless tobacco. She reports that she drinks alcohol. She reports that she does not use illicit drugs.  Allergies:  Allergies   Allergen  Reactions   .  Nitrofurantoin  Itching   .  Sulfamethoxazole-Trimethoprim  Itching    Medications Prior to Admission   Medication  Sig  Dispense  Refill   .  Multiple Vitamin (MULTIVITAMIN WITH MINERALS) TABS  Take  1 tablet by mouth daily.      Home:  Home Living  Family/patient expects to be discharged to:: Private residence  Living Arrangements: Spouse/significant other  Available Help at Discharge: Family;Available PRN/intermittently  Type of Home: House  Home Access: Stairs to enter  [employer] of Steps: 3  Entrance Stairs-Rails: None  Home Layout: Two level  Alternate Level Stairs-Number of Steps: flight  Alternate Level Stairs-Rails: Right;Left;Can reach both  Additional Comments: Per pt she is alone during the day and family is  unable to provide 24hr A.  Lives With: Spouse;Family  Functional History:  Prior Function  Vocation: On disability  Functional Status:  Mobility:  Bed Mobility  Bed Mobility: Supine to Sit  Supine to Sit: 4: Min assist  Transfers  Sit to Stand: 4: Min guard;With upper extremity assist;From bed;From toilet;From chair/3-in-1  Stand to Sit: 4: Min guard;With upper extremity assist;To chair/3-in-1;To toilet  Ambulation/Gait  Ambulation Distance (Feet): 150 Feet  General Gait Details: Continues to have ataxic gait with right side lean. Cues for posture and for LE placement   ADL:  ADL  Eating/Feeding: Set up  Where Assessed - Eating/Feeding: Chair  Grooming: Wash/dry face;Min guard  Where Assessed - Grooming: Supported standing  Lower Body Bathing: Min guard  Where Assessed - Lower Body Bathing: Supported sit to stand  Upper Body Dressing: Minimal assistance  Where Assessed - Upper Body Dressing: Supported sitting  Lower Body Dressing: Minimal assistance  Where Assessed - Lower Body Dressing: Supported sit to Retail buyer: Estate agent Method: Sit to Surveyor, quantity: Comfort height toilet;Grab bars  Tub/Shower Transfer Method: Not assessed  Equipment Used: Gait belt;Rolling walker  Transfers/Ambulation Related to ADLs: Min A for ambulation. Min guard for transfers. Cues for technique with gait.  ADL Comments: Pt performed LB bathing and dressing. Educated on dressing technique and to sit for bathing and dressing. Ambulated in hallway. Educated on fine motor activities to be doing.  Cognition:  Cognition  Overall Cognitive Status: Within Functional Limits for tasks assessed (however did forget what OT asked her to go retrieve for bath)  Arousal/Alertness: Awake/alert  Orientation Level: Oriented X4  Attention: Focused;Sustained;Selective;Alternating  Focused Attention: Appears intact  Sustained Attention: Appears intact  Selective  Attention: Appears intact  Alternating Attention: Appears intact  Memory: Appears intact  Awareness: Appears intact  Problem Solving: Appears intact  Cognition  Arousal/Alertness: Awake/alert  Behavior During Therapy: WFL for tasks assessed/performed  Overall Cognitive Status: Within Functional Limits for tasks assessed (however did forget what OT asked her to go retrieve for bath)  Physical Exam:  Blood pressure 132/54, pulse 71, temperature 98.3 F (36.8 C), temperature source Oral, resp. rate 18, height 5\' 1"  (1.549 m), weight 49.9 kg (110 lb 0.2 oz), SpO2 96.00%.  Physical Exam  Constitutional: She is oriented to person, place, and time. She appears well-developed and well-nourished.  HENT:  Head: Normocephalic.  Eyes: Conjunctivae are normal. Pupils are equal, round, and reactive to light.  Neck: Normal range of motion.  Cardiovascular: Normal rate and regular rhythm.  Respiratory: Effort normal and breath sounds normal. No respiratory distress. She has no wheezes.  GI: Soft. Bowel sounds are normal. She exhibits no distension. There is no tenderness.  Musculoskeletal: She exhibits no edema and no tenderness.  Neurological: She is alert and oriented to person, place, and time.  Ataxic, halting speech. Follows basic commands without difficulty. Good recall with fair insight and awareness. Dysmetria LUE>RUE.  Decrease heel to shin BLE as well. Decreased fine motor bilateral upper extremities Skin: Skin is warm and dry.  Psychiatric: She has a normal mood and affect. Her behavior is normal  Motor strength is 4/5 bilateral deltoid, bicep, tricep, grip, hip flexion, knee extensors, ankle dorsiflexor plantar flex Sensation is noted to be normal in bilateral upper and lower extremities to light touch No results found for this or any previous visit (from the past 48 hour(s)).  No results found.  Post Admission Physician Evaluation:  1. Functional deficits secondary to fall resulting in  traumatic brain injury as well as left parietal fracture., Right frontal IPH. 2. Patient is admitted to receive collaborative, interdisciplinary care between the physiatrist, rehab nursing staff, and therapy team. 3. Patient's level of medical complexity and substantial therapy needs in context of that medical necessity cannot be provided at a lesser intensity of care such as a SNF. 4. Patient has experienced substantial functional loss from his/her baseline which was documented above under the "Functional History" and "Functional Status" headings. Judging by the patient's diagnosis, physical exam, and functional history, the patient has potential for functional progress which will result in measurable gains while on inpatient rehab. These gains will be of substantial and practical use upon discharge in facilitating mobility and self-care at the household level. 5. Physiatrist will provide 24 hour management of medical needs as well as oversight of the therapy plan/treatment and provide guidance as appropriate regarding the interaction of the two. 6. 24 hour rehab nursing will assist with bladder management, bowel management, safety, skin/wound care, disease management, medication administration, pain management and patient education and help integrate therapy concepts, techniques,education, etc. 7. PT will assess and treat for/with: pre gait, gait training, endurance , safety, equipment, neuromuscular re education. Goals are: Sup mob. 8. OT will assess and treat for/with: ADLs, Cognitive perceptual skills, Neuromuscular re education, safety, endurance, equipment. Goals are: Sup ADL. 9. SLP will assess and treat for/with: Assess cognition. Goals are: Improve safety awareness. 10. Case Management and Social Worker will assess and treat for psychological issues and discharge planning. 11. Team conference will be held weekly to assess progress toward goals and to determine barriers to discharge. 12. Patient  will receive at least 3 hours of therapy per day at least 5 days per week. 13. ELOS: 8-12 days  14. Prognosis: good Medical Problem List and Plan:  1. 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 DVT Prophylaxis/Anticoagulation: SCDs. Monitor for any signs of DVT  3. Pain Management: Ultram and Robaxin as needed. Monitor with increased mobility  4. Neuropsych: This patient is capable of making decisions on her own behalf.   Charlett Blake M.D. [facility] FAAPM&R (Sports Med, Neuromuscular Med) Diplomate Am Board of Electrodiagnostic Med Diplomate Am Board of Pain Medicine Fellow Am Board of Interventional Pain Physicians

## 2013-12-14 NOTE — Discharge Summary (Signed)
Physician Discharge Summary  Patient ID: Claudia Perez MRN: [id number] DOB/AGE: 04/21/1951 63 y.o.  Admit date: 12/09/2013 Discharge date: 12/14/2013  Discharge Diagnoses Patient Active Problem List   Diagnosis Date Noted  . Fall down stairs 12/11/2013  . Basilar skull fracture 12/11/2013  . Traumatic intracranial hemorrhage 12/11/2013  . Back pain 12/11/2013  . Subarachnoid hemorrhage following injury 12/09/2013  . Syncope 03/27/2013  . Clavicular fracture 03/27/2013  . COUGH 12/28/2007    Consultants Dr. Hazle Coca for neurosurgery  Dr. Alger Simons for PM&R   Procedures None   HPI: [name] suffered a fall in which she landed on the back of her head. She denied loss of consciousness or syncope. She underwent a CT scan of the head which showed the brain injury and skull fracture. She was admitted to the trauma service and neurosurgery was consulted.   Hospital Course: Neurosurgery recommended non-operative treatment for the patient's TBI. A repeat CT scan the next day did show worsening of her intracerebral contusions but she had improved clinically. The traumatic brain injury team worked with her and recommended inpatient rehabilitation. They were consulted and she was transferred there in improved condition.   Scheduled Meds: . docusate sodium  100 mg Oral BID  . polyethylene glycol  17 g Oral Daily   Continuous Infusions:  PRN Meds:.bisacodyl, methocarbamol, morphine injection, ondansetron (ZOFRAN) IV, ondansetron, traMADol    Medication List         multivitamin with minerals Tabs tablet  Take 1 tablet by mouth daily.             Follow-up Information   Schedule an appointment as soon as possible for a visit with Otilio Connors, MD.   Specialty:  Neurosurgery   Contact information:   [place], [address] [place] [address] [phone]       Call [place]. (As needed)    Contact information:   [address] [place] [address] [phone]       Signed: Lisette Abu, PA-C Pager: [phone] General Trauma PA Pager: [phone]  12/14/2013, 3:54 PM

## 2013-12-14 NOTE — Discharge Summary (Signed)
Ether Goebel, MD, MPH, FACS Pager: [phone]  

## 2013-12-14 NOTE — PMR Pre-admission (Signed)
PMR Admission Coordinator Pre-Admission Assessment  Patient: Claudia Perez is an 63 y.o., female MRN: [id number] DOB: 1951/10/18 Height: 5\' 1"  (154.9 cm) Weight: 49.9 kg (110 lb 0.2 oz)              Insurance Information HMO:     PPO:      PCP:      IPA:      80/20: yes     OTHER:  No HMO PRIMARY: Medicare a and b      Policy#: [id number]      Subscriber: pt Benefits:  Phone #: online     Name: 12/14/13 Eff. Date: 04/30/06     Deduct: $1260      Out of Pocket Max: none      Life Max: none CIR: 100%      SNF: 20 full days Outpatient: 80%     Co-Pay: 20% Home Health: 100%      Co-Pay: none DME: 80%     Co-Pay: 20% Providers: pt choice  SECONDARY: [employer]      Policy#: [id number]      Subscriber: pt No auth needed  THIRD: [place]  [id number] pt No auth required   Medicaid Application Date:       Case Manager:   Emergency Contact Information Contact Information   Name Relation Home Work Mobile   [place] Spouse [phone]  [phone]     Current Medical History  Patient Admitting Diagnosis: TBI/skull fx after a fall. Has hx of prior TBI  History of Present Illness:Claudia Perez is a 63 y.o. 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   Fall unwitnessed but patient reports dizziness prior to fall--no LOC or seizure activity. CT head with Nondisplaced left parietal skull fracture with large left parietal and occipital scalp hematomas, small SDH, right frontal intraparenchymal hemorrhage as well as SAH in bilateral frontal and right temporal region.Marland KitchenShe was evaluated by Dr. Luiz Ochoa and serial CCT recommended for monitoring. Follow up CCT with modest increase in hemorrhagic contusions. CT thoracic spine and cervical spine negative for fracture. Patient with complaints of head and neck pain as well as dizziness due to vertigo.   Husband reports staggering gait and tend  to vear to the right. Recurrent vertigo and chronic RLE weakness and headaches.  Past Medical History  Past Medical History  Diagnosis Date  . Mobility impaired   . Hypercholesteremia   . Vitamin D deficiency   . Shortness of breath     "even now, just talking" (03/27/2013)  . Hyperthyroidism     "a little bit; don't take RX for it" (03/27/2013)  . GERD (gastroesophageal reflux disease)   . Seizures     "think I might have; been blacking out several times in the past" (03/27/2013)  . Fall at home 03/27/2013    "lost consciousness; fractured right clavicle" (03/27/2013)    Family History  family history includes Hypertension in her mother.  Prior Rehab/Hospitalizations: spouse reports HH therapy after prolonged hospitalization after initial TBI 2004  Current Medications  Current facility-administered medications:bisacodyl (DULCOLAX) suppository 10 mg, 10 mg, Rectal, Daily PRN, Lisette Abu, PA-C;  docusate sodium (COLACE) capsule 100 mg, 100 mg, Oral, BID, Lisette Abu, PA-C, 100 mg at 12/14/13 1024;  methocarbamol (ROBAXIN) tablet 500-1,000 mg, 500-1,000 mg, Oral, Q6H PRN, Lisette Abu, PA-C, 1,000 mg at 12/13/13 2149 morphine 2 MG/ML injection 2  mg, 2 mg, Intravenous, Q4H PRN, Lisette Abu, PA-C;  ondansetron [facility]) injection 4 mg, 4 mg, Intravenous, Q6H PRN, Imogene Burn. Tsuei, MD;  ondansetron (ZOFRAN) tablet 4 mg, 4 mg, Oral, Q6H PRN, Imogene Burn. Tsuei, MD;  polyethylene glycol (MIRALAX / GLYCOLAX) packet 17 g, 17 g, Oral, Daily, Lisette Abu, PA-C, 17 g at 12/13/13 1005 traMADol (ULTRAM) tablet 50-100 mg, 50-100 mg, Oral, Q6H PRN, Lisette Abu, PA-C, 50 mg at 12/14/13 1049  Patients Current Diet: General  Precautions / Restrictions Precautions Precautions: Fall Precaution Comments: Spoke with Dr Luiz Ochoa who states no Vestibular testing for a week from today to allow more healing time prior to performing any quick head movements or dangling head off  of bed.   Restrictions Weight Bearing Restrictions: No   Prior Activity Level Limited Community (1-2x/wk): typically independent at home without ad but with balance issues pta  Home Assistive Devices / Equipment Home Assistive Devices/Equipment: None  Prior Functional Level Prior Function Level of Independence: Needs assistance ADL's / Homemaking Assistance Needed: pt indicates family performs homemaking.   Comments: spouse states that pt has supervision always during shower due to balance issues  Current Functional Level Cognition  Arousal/Alertness: Awake/alert Overall Cognitive Status: Within Functional Limits for tasks assessed (however did forget what OT asked her to go retrieve for bath) Orientation Level: Oriented X4 Attention: Focused;Sustained;Selective;Alternating Focused Attention: Appears intact Sustained Attention: Appears intact Selective Attention: Appears intact Alternating Attention: Appears intact Memory: Appears intact Awareness: Appears intact Problem Solving: Appears intact    Extremity Assessment (includes Sensation/Coordination)          ADLs  Eating/Feeding: Set up Where Assessed - Eating/Feeding: Chair Grooming: Wash/dry face;Min guard Where Assessed - Grooming: Supported standing Lower Body Bathing: Min guard Where Assessed - Lower Body Bathing: Supported sit to stand Upper Body Dressing: Minimal assistance Where Assessed - Upper Body Dressing: Supported sitting Lower Body Dressing: Minimal assistance Where Assessed - Lower Body Dressing: Supported sit to Lobbyist: Magazine features editor Method: Sit to Loss adjuster, chartered: Comfort height toilet;Grab bars Toileting - Water quality scientist and Hygiene: Min guard Where Assessed - Best boy and Hygiene: Standing Tub/Shower Transfer Method: Not assessed Equipment Used: Gait belt;Rolling walker Transfers/Ambulation Related to ADLs: Min A for  ambulation. Min guard for transfers. Cues for technique with gait. ADL Comments: Pt performed LB bathing and dressing. Educated on dressing technique and to sit for bathing and dressing. Ambulated in hallway. Educated on fine motor activities to be doing.    Mobility  Bed Mobility: Supine to Sit Supine to Sit: 4: Min assist    Transfers  Sit to Stand: 4: Min guard;With upper extremity assist;From bed;From toilet;From chair/3-in-1 Stand to Sit: 4: Min guard;With upper extremity assist;To chair/3-in-1;To toilet    Ambulation / Gait / Stairs / Wheelchair Mobility  Ambulation/Gait Ambulation Distance (Feet): 150 Feet General Gait Details: Continues to have ataxic gait with right side lean. Cues for posture and for LE placement     Posture / Balance      Special needs/care consideration Bowel mgmt: continent Bladder mgmt: continent    Previous Home Environment Living Arrangements: Spouse/significant other;Children;Other (Comment) (63 and 79 yo daughters)  Lives With: Spouse;Family Available Help at Discharge: Other (Comment);Available PRN/intermittently;Family (spouse works 12 noon until 8 pm; 94 yo home at 5 after schoo) Type of Home: House Home Layout: Two level Alternate Level Stairs-Rails: Right;Left;Can reach both Alternate Level Stairs-Number of Steps: flight Home Access: Stairs to  enter Entrance Stairs-Rails: None Entrance Stairs-Number of Steps: 3 Bathroom Shower/Tub: Engineer, mining: Yes Home Care Services: No Additional Comments: spouse works 12 to 8 pm; 29 you dtr home at 5 pm. 63 yo in college  Discharge Living Setting Plans for Discharge Living Setting: Patient's home;Lives with (comment) Type of Home at Discharge: House Discharge Home Layout: Two level;1/2 bath on main level;Bed/bath upstairs Alternate Level Stairs-Rails: Right;Left;Can reach both Alternate Level Stairs-Number of Steps: flight Discharge Home Access:  Stairs to enter Entrance Stairs-Rails: None Entrance Stairs-Number of Steps: 3 Discharge Bathroom Shower/Tub: Horticulturist, commercial: Standard Discharge Bathroom Accessibility: Yes Does the patient have any problems obtaining your medications?: No  Social/Family/Support Systems Patient Roles: Spouse;Parent Contact Information: Secondary school teacher, spouse Anticipated Caregiver: family Anticipated Caregiver's Contact Information: see above Ability/Limitations of Caregiver: spouse works 12 noon until 8 pm Caregiver Availability: Intermittent Discharge Plan Discussed with Primary Caregiver: Yes Is Caregiver In Agreement with Plan?: Yes Does Caregiver/Family have Issues with Lodging/Transportation while Pt is in Rehab?: No    Goals/Additional Needs Patient/Family Goal for Rehab: Mod I to supervision with PT, OT, and SLP Expected length of stay: ELOS 7 to 9 days Cultural Considerations: [place] Pt/Family Agrees to Admission and willing to participate: Yes Program Orientation Provided & Reviewed with Pt/Caregiver Including Roles  & Responsibilities: Yes   Decrease burden of Care through IP rehab admission: n/a  Possible need for SNF placement upon discharge:not anticipated  Patient Condition: This patient's condition remains as documented in the consult dated 12/12/13, in which the Rehabilitation Physician determined and documented that the patient's condition is appropriate for intensive rehabilitative care in an inpatient rehabilitation facility .  Will admit to inpatient rehab today.  Preadmission Screen Completed By:  Cleatrice Burke, 12/14/2013 2:57 PM ______________________________________________________________________   Discussed status with Dr. Letta Pate on 12/14/13 at  1457 and received telephone approval for admission today.  Admission Coordinator:  Cleatrice Burke, time 6440 Date 12/14/13.

## 2013-12-14 NOTE — H&P (Signed)
Physical Medicine and Rehabilitation Admission H&P  Chief Complaint   Patient presents with   .  Fall   :  Chief complaint: Headache  HPI: Claudia Perez is a 63 y.o. 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 Patient on no scheduled medications prior to admission. Fall unwitnessed but patient reports dizziness prior to fall--no LOC or seizure activity. CT head with Nondisplaced left parietal skull fracture with large left parietal and occipital scalp hematomas, small SDH, right frontal intraparenchymal hemorrhage as well as SAH in bilateral frontal and right temporal region.Marland KitchenShe was evaluated by Dr. Luiz Ochoa and serial CCT recommended for monitoring. Follow up CCT 12/11/2013 with modest increase in hemorrhagic contusions. CT thoracic spine and cervical spine negative for fracture. Patient with complaints of head and neck pain as well as dizziness due to vertigo. Patient is tolerating a regular diet. Physical occupational therapy evaluations completed an ongoing with recommendations of physical medicine rehabilitation consult to consider inpatient rehabilitation services. Patient was felt to be a good candidate was admitted for comprehensive rehabilitation  program  Patient's husband states that she had chronic problems with balance since the prior traumatic brain injury but is worse after this most recent fall  ROS Review of Systems  HENT: Negative for hearing loss.  Eyes: Negative for blurred vision and double vision.  Respiratory: Negative for cough and shortness of breath.  Cardiovascular: Negative for chest pain and palpitations.  Gastrointestinal: Negative for nausea and vomiting.  Genitourinary: Negative for dysuria.  Musculoskeletal: Positive for falls (husband reports staggering gait--tends to vear to the right. ), myalgias and neck pain. Negative for  back pain.  Neurological: Positive for dizziness (recurrent vertigo), focal weakness (chronic RLE weakness) and headaches  All other systems negative  Past Medical History   Diagnosis  Date   .  Mobility impaired    .  Hypercholesteremia    .  Vitamin D deficiency    .  Shortness of breath      "even now, just talking" (03/27/2013)   .  Hyperthyroidism      "a little bit; don't take RX for it" (03/27/2013)   .  GERD (gastroesophageal reflux disease)    .  Seizures      "think I might have; been blacking out several times in the past" (03/27/2013)   .  Fall at home  03/27/2013     "lost consciousness; fractured right clavicle" (03/27/2013)    Past Surgical History   Procedure  Laterality  Date   .  Appendectomy   1960's   .  Eye surgery   02/2013     cataract extraction w/ intraocular lens implant   .  Brain surgery   10/2003     SDH evac    Family History   Problem  Relation  Age of Onset   .  Hypertension  Mother    Social History: reports that she has never smoked. She has never used smokeless tobacco. She reports that she drinks alcohol. She reports that she does not use illicit drugs.  Allergies:  Allergies   Allergen  Reactions   .  Nitrofurantoin  Itching   .  Sulfamethoxazole-Trimethoprim  Itching    Medications Prior to Admission   Medication  Sig  Dispense  Refill   .  Multiple Vitamin (MULTIVITAMIN WITH MINERALS) TABS  Take 1 tablet  by mouth daily.     Home:  Home Living  Family/patient expects to be discharged to:: Private residence  Living Arrangements: Spouse/significant other  Available Help at Discharge: Family;Available PRN/intermittently  Type of Home: House  Home Access: Stairs to enter  [employer] of Steps: 3  Entrance Stairs-Rails: None  Home Layout: Two level  Alternate Level Stairs-Number of Steps: flight  Alternate Level Stairs-Rails: Right;Left;Can reach both  Additional Comments: Per pt she is alone during the day and family is unable  to provide 24hr A.  Lives With: Spouse;Family  Functional History:  Prior Function  Vocation: On disability  Functional Status:  Mobility:  Bed Mobility  Bed Mobility: Supine to Sit  Supine to Sit: 4: Min assist  Transfers  Sit to Stand: 4: Min guard;With upper extremity assist;From bed;From toilet;From chair/3-in-1  Stand to Sit: 4: Min guard;With upper extremity assist;To chair/3-in-1;To toilet  Ambulation/Gait  Ambulation Distance (Feet): 150 Feet  General Gait Details: Continues to have ataxic gait with right side lean. Cues for posture and for LE placement  ADL:  ADL  Eating/Feeding: Set up  Where Assessed - Eating/Feeding: Chair  Grooming: Wash/dry face;Min guard  Where Assessed - Grooming: Supported standing  Lower Body Bathing: Min guard  Where Assessed - Lower Body Bathing: Supported sit to stand  Upper Body Dressing: Minimal assistance  Where Assessed - Upper Body Dressing: Supported sitting  Lower Body Dressing: Minimal assistance  Where Assessed - Lower Body Dressing: Supported sit to Retail buyer: Estate agent Method: Sit to Surveyor, quantity: Comfort height toilet;Grab bars  Tub/Shower Transfer Method: Not assessed  Equipment Used: Gait belt;Rolling walker  Transfers/Ambulation Related to ADLs: Min A for ambulation. Min guard for transfers. Cues for technique with gait.  ADL Comments: Pt performed LB bathing and dressing. Educated on dressing technique and to sit for bathing and dressing. Ambulated in hallway. Educated on fine motor activities to be doing.  Cognition:  Cognition  Overall Cognitive Status: Within Functional Limits for tasks assessed (however did forget what OT asked her to go retrieve for bath)  Arousal/Alertness: Awake/alert  Orientation Level: Oriented X4  Attention: Focused;Sustained;Selective;Alternating  Focused Attention: Appears intact  Sustained Attention: Appears intact  Selective Attention:  Appears intact  Alternating Attention: Appears intact  Memory: Appears intact  Awareness: Appears intact  Problem Solving: Appears intact  Cognition  Arousal/Alertness: Awake/alert  Behavior During Therapy: WFL for tasks assessed/performed  Overall Cognitive Status: Within Functional Limits for tasks assessed (however did forget what OT asked her to go retrieve for bath)  Physical Exam:  Blood pressure 132/54, pulse 71, temperature 98.3 F (36.8 C), temperature source Oral, resp. rate 18, height 5\' 1"  (1.549 m), weight 49.9 kg (110 lb 0.2 oz), SpO2 96.00%.  Physical Exam  Constitutional: She is oriented to person, place, and time. She appears well-developed and well-nourished.  HENT:  Head: Normocephalic.  Eyes: Conjunctivae are normal. Pupils are equal, round, and reactive to light.  Neck: Normal range of motion.  Cardiovascular: Normal rate and regular rhythm.  Respiratory: Effort normal and breath sounds normal. No respiratory distress. She has no wheezes.  GI: Soft. Bowel sounds are normal. She exhibits no distension. There is no tenderness.  Musculoskeletal: She exhibits no edema and no tenderness.  Neurological: She is alert and oriented to person, place, and time.  Ataxic, halting speech. Follows basic commands without difficulty. Good recall with fair insight and awareness. Dysmetria LUE>RUE. Decrease heel to shin  BLE as well. Decreased fine motor bilateral upper extremities Skin: Skin is warm and dry.  Psychiatric: She has a normal mood and affect. Her behavior is normal  Motor strength is 4/5 bilateral deltoid, bicep, tricep, grip, hip flexion, knee extensors, ankle dorsiflexor plantar flex  Sensation is noted to be normal in bilateral upper and lower extremities to light touch  No results found for this or any previous visit (from the past 48 hour(s)).  No results found.  Post Admission Physician Evaluation:  1. Functional deficits secondary to fall resulting in traumatic  brain injury as well as left parietal fracture., Right frontal IPH. 2. Patient is admitted to receive collaborative, interdisciplinary care between the physiatrist, rehab nursing staff, and therapy team. 3. Patient's level of medical complexity and substantial therapy needs in context of that medical necessity cannot be provided at a lesser intensity of care such as a SNF. 4. Patient has experienced substantial functional loss from his/her baseline which was documented above under the "Functional History" and "Functional Status" headings. Judging by the patient's diagnosis, physical exam, and functional history, the patient has potential for functional progress which will result in measurable gains while on inpatient rehab. These gains will be of substantial and practical use upon discharge in facilitating mobility and self-care at the household level. 5. Physiatrist will provide 24 hour management of medical needs as well as oversight of the therapy plan/treatment and provide guidance as appropriate regarding the interaction of the two. 6. 24 hour rehab nursing will assist with bladder management, bowel management, safety, skin/wound care, disease management, medication administration, pain management and patient education and help integrate therapy concepts, techniques,education, etc. 7. PT will assess and treat for/with: pre gait, gait training, endurance , safety, equipment, neuromuscular re education. Goals are: Sup mob. 8. OT will assess and treat for/with: ADLs, Cognitive perceptual skills, Neuromuscular re education, safety, endurance, equipment. Goals are: Sup ADL. 9. SLP will assess and treat for/with: Assess cognition. Goals are: Improve safety awareness. 10. Case Management and Social Worker will assess and treat for psychological issues and discharge planning. 11. Team conference will be held weekly to assess progress toward goals and to determine barriers to discharge. 12. Patient will  receive at least 3 hours of therapy per day at least 5 days per week. 13. ELOS: 8-12 days  14. Prognosis: good Medical Problem List and Plan:  1. 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 DVT Prophylaxis/Anticoagulation: SCDs. Monitor for any signs of DVT  3. Pain Management: Ultram and Robaxin as needed. Monitor with increased mobility  4. Neuropsych: This patient is capable of making decisions on her own behalf.  Charlett Blake M.D.  [facility]  FAAPM&R (Sports Med, Neuromuscular Med)  Diplomate Am Board of Electrodiagnostic Med  Diplomate Am Board of Pain Medicine  Fellow Am Board of Interventional Pain Physicians

## 2013-12-14 NOTE — Progress Notes (Signed)
Patient ID: Claudia Perez, female   DOB: 30-Sep-1951, 63 y.o.   MRN: [id number]   LOS: 5 days   Subjective: Feeling better.   Objective: Vital signs in last 24 hours: Temp:  [97.8 F (36.6 C)-98.8 F (37.1 C)] 98.3 F (36.8 C) (01/15 0520) Pulse Rate:  [67-83] 71 (01/15 0520) Resp:  [18-20] 18 (01/15 0520) BP: (107-133)/(54-68) 132/54 mmHg (01/15 0520) SpO2:  [93 %-100 %] 96 % (01/15 0520) Last BM Date: 12/13/13   Physical Exam General appearance: alert and no distress Resp: clear to auscultation bilaterally Cardio: regular rate and rhythm GI: normal findings: bowel sounds normal and soft, non-tender   Assessment/Plan: Fall  Left parietal skull fracture, small anterior subdural hematoma, right front intraparenchymal hemorrhage, bilateral frontal and right temporal SAH -- TBI Team.  Back pain -- Most likely sprain/bruising from fall.  Vertigo -- Suspect BPPV, neuro recommends holding off on Hallpike and VOR due to pt's skull fx and multiple bleeds. Vestibular follow up needed with Neuro's ok.  FEN - Regular Diet.  VTE - SCD's  Dispo -- TBI is improving as per Neuro and pt is ready to be discharged to CIR.     Lisette Abu, PA-C Pager: [phone] General Trauma PA Pager: [phone]   12/14/2013

## 2013-12-14 NOTE — Progress Notes (Signed)
No c/o  Temp:  [97.8 F (36.6 C)-98.8 F (37.1 C)] 98.3 F (36.8 C) (01/15 0520) Pulse Rate:  [67-83] 71 (01/15 0520) Resp:  [18-20] 18 (01/15 0520) BP: (107-133)/(54-68) 132/54 mmHg (01/15 0520) SpO2:  [93 %-100 %] 96 % (01/15 0520)  Neuro stable  Plan: agree rehab - CT head in few weeks

## 2013-12-14 NOTE — Clinical Social Work Note (Signed)
Clinical Social Work Department BRIEF PSYCHOSOCIAL ASSESSMENT 12/14/2013  Patient:  Claudia Perez, Claudia Perez     Account Number:  [id number]     Admit date:  12/09/2013  Clinical Social Worker:  Myles Lipps  Date/Time:  12/14/2013 10:00 AM  Referred by:  Physician  Date Referred:  12/14/2013 Referred for  Psychosocial assessment  SNF Placement   Other Referral:   Interview type:  Patient Other interview type:   Patient husband at bedside and would not allow patient to answer questions for herself    PSYCHOSOCIAL DATA Living Status:  HUSBAND Admitted from facility:   Level of care:   Primary support name:  Lendell Caprice  [phone] Primary support relationship to patient:  SPOUSE Degree of support available:   Adequate    CURRENT CONCERNS Current Concerns  Post-Acute Placement   Other Concerns:   SNF vs. CIR    SOCIAL WORK ASSESSMENT / PLAN Clinical Social Worker met at bedside with patient and patient husband to offer support and discuss patient needs at discharge.  CSW addressed patient, however patient husband remained overbearing and answered all questions before patient was able to speak.  Patient states that she fell backwards down the stairs.  Patient husband states that patient lives at home with him and he works during the day.  Patient has had a previous incident similar to this event and had some complications due to what he feels was poor "rehab."  Patient husband adamantly states that patient will remains hospitalized at [facility] until she has a bed available for inpatient rehab.  CSW explained that we could look into inpatient rehab in [facility] or for SNF beds due to no bed availability at rehab, however he remains adamant.  Patient husband is not agreeable with SNF search and does not plan to take patient home.    Clinical Social Worker updated PA/MD who made contact with inpatient rehab to determine bed situation.  CSW to follow up with CM regarding insurance denial  vs. possible inpatient rehab placement.  CSW remains available for support as needed.  CSW to complete SBIRT with patient once patient husband is not at bedside.   Assessment/plan status:  Psychosocial Support/Ongoing Assessment of Needs Other assessment/ plan:   Information/referral to community resources:   Holiday representative offered patient husband resources for SNF placement but he adamantly declined    PATIENT'S/FAMILY'S RESPONSE TO PLAN OF CARE: Patient alert and oriented x3, however patient husband would speak over her to answer questions.  Patient seems very passive and willing to cooperate, but patient husband continues to not allow further options outside of inpatient rehab at discharge.  Patient seems to have a good relationship with her husband and his desire to answer questions and talk over her seems to be a cultural arrangement.  Patient and patient husband seem to understand social work role at this time.

## 2013-12-14 NOTE — Progress Notes (Signed)
Her husband is adamant that she stay in the hospital for CIR here. She is ready to D/C to CIR. I spoke with the CIR coordinator and there may be a bed today. Patient examined and I agree with the assessment and plan  Georganna Skeans, MD, MPH, FACS Pager: [phone]  12/14/2013 11:16 AM

## 2013-12-14 NOTE — Progress Notes (Signed)
Patient ID: Claudia Perez, female   DOB: August 08, 1951, 63 y.o.   MRN: [id number] Pt was admitted to room 4W6 from 4N. Admiossion viatl sign are stable.

## 2013-12-15 ENCOUNTER — Inpatient Hospital Stay (HOSPITAL_COMMUNITY): Payer: Medicare Other

## 2013-12-15 ENCOUNTER — Inpatient Hospital Stay (HOSPITAL_COMMUNITY): Payer: Medicare Other | Admitting: Occupational Therapy

## 2013-12-15 ENCOUNTER — Inpatient Hospital Stay (HOSPITAL_COMMUNITY): Payer: Medicare Other | Admitting: *Deleted

## 2013-12-15 ENCOUNTER — Inpatient Hospital Stay (HOSPITAL_COMMUNITY): Payer: Medicare Other | Admitting: Speech Pathology

## 2013-12-15 DIAGNOSIS — S069X9A Unspecified intracranial injury with loss of consciousness of unspecified duration, initial encounter: Secondary | ICD-10-CM

## 2013-12-15 DIAGNOSIS — S069XAA Unspecified intracranial injury with loss of consciousness status unknown, initial encounter: Secondary | ICD-10-CM

## 2013-12-15 LAB — COMPREHENSIVE METABOLIC PANEL
ALK PHOS: 78 U/L (ref 39–117)
ALT: 12 U/L (ref 0–35)
AST: 14 U/L (ref 0–37)
Albumin: 3.1 g/dL — ABNORMAL LOW (ref 3.5–5.2)
BUN: 8 mg/dL (ref 6–23)
CHLORIDE: 92 meq/L — AB (ref 96–112)
CO2: 20 mEq/L (ref 19–32)
CREATININE: 0.52 mg/dL (ref 0.50–1.10)
Calcium: 8.9 mg/dL (ref 8.4–10.5)
GFR calc non Af Amer: >90 mL/min (ref >90)
GLUCOSE: 109 mg/dL — AB (ref 70–99)
POTASSIUM: 4.1 meq/L (ref 3.7–5.3)
Sodium: 127 mEq/L — ABNORMAL LOW (ref 137–147)
Total Bilirubin: 0.6 mg/dL (ref 0.3–1.2)
Total Protein: 6.6 g/dL (ref 6.0–8.3)

## 2013-12-15 LAB — CBC WITH DIFFERENTIAL/PLATELET
Basophils Absolute: 0 10*3/uL (ref 0.0–0.1)
Basophils Relative: 0 % (ref 0–1)
EOS ABS: 0.1 10*3/uL (ref 0.0–0.7)
Eosinophils Relative: 1 % (ref 0–5)
HCT: 34.4 % — ABNORMAL LOW (ref 36.0–46.0)
HEMOGLOBIN: 12.4 g/dL (ref 12.0–15.0)
LYMPHS ABS: 1.4 10*3/uL (ref 0.7–4.0)
Lymphocytes Relative: 15 % (ref 12–46)
MCH: 32.5 pg (ref 26.0–34.0)
MCHC: 36 g/dL (ref 30.0–36.0)
MCV: 90.1 fL (ref 78.0–100.0)
Monocytes Absolute: 1.4 10*3/uL — ABNORMAL HIGH (ref 0.1–1.0)
Monocytes Relative: 15 % — ABNORMAL HIGH (ref 3–12)
NEUTROS ABS: 6.5 10*3/uL (ref 1.7–7.7)
NEUTROS PCT: 69 % (ref 43–77)
Platelets: 262 10*3/uL (ref 150–400)
RBC: 3.82 MIL/uL — ABNORMAL LOW (ref 3.87–5.11)
RDW: 11.9 % (ref 11.5–15.5)
WBC: 9.4 10*3/uL (ref 4.0–10.5)

## 2013-12-15 MED ORDER — TOPIRAMATE 25 MG PO TABS
25.0000 mg | ORAL_TABLET | Freq: Every day | ORAL | Status: DC
  Administered 2013-12-15 – 2013-12-28 (×14): 25 mg via ORAL
  Filled 2013-12-15 (×15): qty 1

## 2013-12-15 NOTE — Progress Notes (Signed)
  Subjective/Complaints: Complains of headache. Ultram seems to help. Has had headaches since she was admitted. Headaches mostly frontal in location. A 12 point review of systems has been performed and if not noted above is otherwise negative.   Objective: Vital Signs: Blood pressure 134/63, pulse 67, temperature 98.2 F (36.8 C), temperature source Oral, resp. rate 18, height 5\' 1"  (1.549 m), weight 51.3 kg (113 lb 1.5 oz), SpO2 97.00%. No results found.  Recent Labs  12/15/13 0526  WBC 9.4  HGB 12.4  HCT 34.4*  PLT 262    Recent Labs  12/15/13 0526  NA 127*  K 4.1  CL 92*  GLUCOSE 109*  BUN 8  CREATININE 0.52  CALCIUM 8.9   CBG (last 3)  No results found for this basename: GLUCAP,  in the last 72 hours  Wt Readings from Last 3 Encounters:  12/14/13 51.3 kg (113 lb 1.5 oz)  12/10/13 49.9 kg (110 lb 0.2 oz)  03/27/13 54.069 kg (119 lb 3.2 oz)    Physical Exam:  Constitutional: She is oriented to person, place, and time. She appears well-developed and well-nourished. No distress HENT:  Head: Normocephalic.  Eyes: Conjunctivae are normal. Pupils are equal, round, and reactive to light.  Neck: Normal range of motion.  Cardiovascular: Normal rate and regular rhythm.  Respiratory: Effort normal and breath sounds normal. No respiratory distress. She has no wheezes.  GI: Soft. Bowel sounds are normal. She exhibits no distension. There is no tenderness.  Musculoskeletal: She exhibits no edema and no tenderness.  Neurological: She is alert and oriented to person, place, and time.  Ataxic, halting speech. Follows basic commands without difficulty. Good recall with fair insight and awareness. Dysmetria LUE>RUE. Decrease heel to shin BLE as well. Decreased fine motor bilateral upper. Strength grossly 3+ to 4-/5 with more weakness in the left upper.   Psychiatric: She has a normal mood and affect. Her behavior is normal     Assessment/Plan: 1. Functional deficits secondary  to traumatic brain injury with skull fx which require 3+ hours per day of interdisciplinary therapy in a comprehensive inpatient rehab setting. Physiatrist is providing close team supervision and 24 hour management of active medical problems listed below. Physiatrist and rehab team continue to assess barriers to discharge/monitor patient progress toward functional and medical goals.   FIM:                   Comprehension Comprehension Mode: Auditory Comprehension: 5-Follows basic conversation/direction: With no assist  Expression Expression Mode: Verbal Expression: 5-Expresses basic needs/ideas: With extra time/assistive device  Social Interaction Social Interaction: 5-Interacts appropriately 90% of the time - Needs monitoring or encouragement for participation or interaction.  Problem Solving Problem Solving: 5-Solves basic problems: With no assist  Memory Memory: 6-More than reasonable amt of time  Medical Problem List and Plan:  1. 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 DVT Prophylaxis/Anticoagulation: SCDs. Monitor for any signs of DVT  3. Pain Management: Ultram and Robaxin as needed. Monitor with increased mobility   -add topamax for prophylaxis, 25mg  4. Neuropsych: This patient is capable of making decisions on her own behalf.  5. Hyponatremia: begin mild fluid restriction  -recheck Na+ tomorrow LOS (Days) 1 A FACE TO FACE EVALUATION WAS PERFORMED  SWARTZ,ZACHARY T 12/15/2013 8:31 AM

## 2013-12-15 NOTE — Progress Notes (Signed)
Nursing Note: Pt medicated for pain w/ ultram 50 mg as earlier.Pt began to yell for a towel as nurse was leaving.Pt began to heave w/ small amount of liquid emesis.Pt states she suddenly felt nauseated.A;Pt medicated w/ zofran po .wbb

## 2013-12-15 NOTE — IPOC Note (Addendum)
Overall Plan of Care [facility]) Patient Details Name: Claudia Perez MRN: [id number] DOB: 1951/03/16  Admitting Diagnosis: TBI  Hospital Problems: Active Problems:   TBI (traumatic brain injury)     Functional Problem List: Nursing Bladder;Bowel;Medication Management;Nutrition;Pain;Perception;Safety;Sensory;Skin Integrity  PT Balance;Endurance;Motor;Safety;Other (comment) (strength)  OT Balance;Sensory;Cognition;Endurance;Motor;Pain;Safety  SLP Cognition  TR         Basic ADL's: OT Grooming;Bathing;Dressing;Toileting     Advanced  ADL's: OT       Transfers: PT Bed Mobility;Furniture;Floor;Car;Bed to Chair  OT Toilet;Tub/Shower     Locomotion: PT Ambulation;Wheelchair Mobility;Stairs     Additional Impairments: OT Fuctional Use of Upper Extremity  SLP Social Cognition;Communication expression Memory;Problem Solving  TR      Anticipated Outcomes Item Anticipated Outcome  Self Feeding no goal  Swallowing      Basic self-care  supervision  Toileting  supervision   Bathroom Transfers supervision  Bowel/Bladder  cont lof bowel and bbladder  Transfers  Supervision  Locomotion  Supervision to min A  Communication  Mod I   Cognition  Supervision   Pain  less than 2  Safety/Judgment  adhere to safety protocol   Therapy Plan: PT Intensity: Minimum of 1-2 x/day ,45 to 90 minutes PT Frequency: 5 out of 7 days PT Duration Estimated Length of Stay: 12-14 days OT Intensity: Minimum of 1-2 x/day, 45 to 90 minutes OT Frequency: 5 out of 7 days OT Duration/Estimated Length of Stay: 2 weeks  SLP Intensity: Minumum of 1-2 x/day, 30 to 90 minutes SLP Frequency: 5 out of 7 days SLP Duration/Estimated Length of Stay: 12-14 days       Team Interventions: Nursing Interventions Patient/Family Education;Bladder Management;Bowel Management;Disease Management/Prevention;Discharge Planning;Medication Management;Pain Management;Skin Care/Wound Management  PT interventions  Balance/vestibular training;Ambulation/gait training;Discharge planning;Disease management/prevention;Functional mobility training;Patient/family education;Therapeutic Exercise;Visual/perceptual remediation/compensation;UE/LE Coordination activities;Therapeutic Activities;Neuromuscular re-education;Pain management;DME/adaptive equipment instruction;Stair training;UE/LE Strength taining/ROM;Wheelchair propulsion/positioning;Cognitive remediation/compensation;Community reintegration  OT Interventions Balance/vestibular training;Community reintegration;DME/adaptive equipment instruction;Cognitive remediation/compensation;Disease mangement/prevention;Discharge planning;Pain management;Self Care/advanced ADL retraining;Therapeutic Activities;UE/LE Coordination activities;Functional mobility training;Patient/family education;Therapeutic Exercise;Splinting/orthotics;UE/LE Strength taining/ROM;Neuromuscular re-education  SLP Interventions Cognitive remediation/compensation;Cueing hierarchy;Functional tasks;Environmental controls;Internal/external aids;Patient/family education;Therapeutic Activities  TR Interventions    SW/CM Interventions      Team Discharge Planning: Destination: PT-Home ,OT- Home , SLP-Home Projected Follow-up: PT-Home health PT, OT-  24 hour supervision/assistance;Home health OT, SLP-Home Health SLP;24 hour supervision/assistance Projected Equipment Needs: PT-To be determined, OT-  To be determined, SLP-None recommended by SLP Equipment Details: PT- , OT-  Patient/family involved in discharge planning: PT- Patient;Family member/caregiver,  OT-Patient, SLP-Patient  MD ELOS: 12-14 days Medical Rehab Prognosis:  Excellent Assessment: The patient has been admitted for CIR therapies. The team will be addressing, functional mobility, strength, stamina, balance, safety, adaptive techniques/equipment, self-care, bowel and bladder mgt, patient and caregiver education, cognition, communication,  pain mgt, NMR, . Goals have been set at supervision with self-care, supervision to min assist with mobility, and supervision to mod I with cognition and communication.    Meredith Staggers, MD, FAAPMR    See Team Conference Notes for weekly updates to the plan of care

## 2013-12-15 NOTE — Evaluation (Signed)
Occupational Therapy Assessment and Plan  Patient Details  Name: Claudia Perez MRN: [id number] Date of Birth: 1950-12-11  OT Diagnosis: ataxia and muscle weakness (generalized) Rehab Potential: Rehab Potential: Good ELOS: 2 weeks    Today's Date: 12/15/2013 Time: 1100-1155 Time Calculation (min): 55 min  Problem List:  Patient Active Problem List   Diagnosis Date Noted  . TBI (traumatic brain injury) 12/14/2013  . Fall down stairs 12/11/2013  . Basilar skull fracture 12/11/2013  . Traumatic intracranial hemorrhage 12/11/2013  . Back pain 12/11/2013  . Subarachnoid hemorrhage following injury 12/09/2013  . Syncope 03/27/2013  . Clavicular fracture 03/27/2013  . COUGH 12/28/2007    Past Medical History:  Past Medical History  Diagnosis Date  . Mobility impaired   . Hypercholesteremia   . Vitamin D deficiency   . Shortness of breath     "even now, just talking" (03/27/2013)  . Hyperthyroidism     "a little bit; don't take RX for it" (03/27/2013)  . GERD (gastroesophageal reflux disease)   . Seizures     "think I might have; been blacking out several times in the past" (03/27/2013)  . Fall at home 03/27/2013    "lost consciousness; fractured right clavicle" (03/27/2013)   Past Surgical History:  Past Surgical History  Procedure Laterality Date  . Appendectomy  1960's  . Eye surgery  02/2013    cataract extraction w/ intraocular lens implant  . Brain surgery  10/2003    SDH evac    Assessment & Plan Clinical Impression: Patient is a 63 y.o. 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 Patient on no scheduled medications prior to admission. Fall unwitnessed but patient reports dizziness prior to fall--no LOC or seizure activity. CT head with Nondisplaced left parietal skull fracture with large left parietal and occipital scalp  hematomas, small SDH, right frontal intraparenchymal hemorrhage as well as SAH in bilateral frontal and right temporal region.Marland KitchenShe was evaluated by Dr. Luiz Ochoa and serial CCT recommended for monitoring. Follow up CCT 12/11/2013 with modest increase in hemorrhagic contusions. CT thoracic spine and cervical spine negative for fracture. Patient with complaints of head and neck pain as well as dizziness due to vertigo.  Patient transferred to CIR on 12/14/2013 .    Patient currently requires min to mod A with basic self-care skills and basic mobility secondary to muscle weakness, decreased cardiorespiratoy endurance, unbalanced muscle activation, ataxia, decreased coordination and decreased motor planning, decreased visual motor skills, peripheral and decreased standing balance and decreased balance strategies.  Prior to hospitalization, patient could complete ADL with mod I to supervision.  Patient will benefit from skilled intervention to decrease level of assist with basic self-care skills and increase independence with basic self-care skills prior to discharge home with care partner.  Anticipate patient will require intermittent supervision and follow up outpatient.  OT - End of Session Activity Tolerance: Tolerates 30+ min activity with multiple rests Endurance Deficit: Yes OT Assessment Rehab Potential: Good OT Patient demonstrates impairments in the following area(s): Balance;Sensory;Cognition;Endurance;Motor;Pain;Safety OT Basic ADL's Functional Problem(s): Grooming;Bathing;Dressing;Toileting OT Transfers Functional Problem(s): Toilet;Tub/Shower OT Additional Impairment(s): Fuctional Use of Upper Extremity OT Plan OT Intensity: Minimum of 1-2 x/day, 45 to 90 minutes OT Frequency: 5 out of 7 days OT Duration/Estimated Length of Stay: 2 weeks  OT Treatment/Interventions: Balance/vestibular training;Community reintegration;DME/adaptive equipment instruction;Cognitive  remediation/compensation;Disease mangement/prevention;Discharge planning;Pain  management;Self Care/advanced ADL retraining;Therapeutic Activities;UE/LE Coordination activities;Functional mobility training;Patient/family education;Therapeutic Exercise;Splinting/orthotics;UE/LE Strength taining/ROM;Neuromuscular re-education OT Self Feeding Anticipated Outcome(s): no goal OT Basic Self-Care Anticipated Outcome(s): supervision OT Toileting Anticipated Outcome(s): supervision OT Bathroom Transfers Anticipated Outcome(s): supervision OT Recommendation Patient destination: Home Follow Up Recommendations: 24 hour supervision/assistance;Home health OT   Skilled Therapeutic Intervention   OT Evaluation Precautions/Restrictions  Precautions Precautions: Fall Precaution Comments: Spoke with Dr Luiz Ochoa who states no Vestibular testing for a week from today to allow more healing time prior to performing any quick head movements or dangling head off of bed.   Restrictions Weight Bearing Restrictions: No General Chart Reviewed: Yes Family/Caregiver Present: Yes Vital Signs   Pain  c/o back pain - RN made aware.  Home Living/Prior Functioning Home Living Family/patient expects to be discharged to:: Private residence Living Arrangements: Spouse/significant other;Children;Other (Comment) Available Help at Discharge: Available PRN/intermittently;Family Type of Home: House Home Access: Stairs to enter Technical brewer of Steps: 3 Entrance Stairs-Rails: None Home Layout: Two level Alternate Level Stairs-Number of Steps: flight Alternate Level Stairs-Rails: Right;Left;Can reach both Additional Comments: spouse works 12 to 8 pm; 17 you dtr home at 5 pm. 63 yo in college  Lives With: Spouse;Family Prior Function Level of Independence: Independent with homemaking with ambulation;Independent with gait;Independent with transfers;Needs assistance with ADLs;Other (comment) ((S) for shower 2/2  decreased balance)  Able to Take Stairs?: Yes Driving: No Vocation: On disability ADL   Vision/Perception  Vision - History Baseline Vision: Wears glasses only for reading Patient Visual Report: No change from baseline Vision - Assessment Eye Alignment: Within Functional Limits Vision Assessment: Vision tested Tracking/Visual Pursuits: Decreased smoothness of horizontal tracking;Requires cues, head turns, or add eye shifts to track Saccades: Overshoots;Additional eye shifts occurred during testing;Decreased speed of saccadic movement Perception Perception: Within Functional Limits Praxis Praxis: Impaired Praxis Impairment Details: Motor planning   Pt with vestibular issues due to fall. According to MD note wait for increased healing time before treating.  Cognition Overall Cognitive Status: History of cognitive impairments - at baseline Arousal/Alertness: Awake/alert Orientation Level: Oriented X4 Attention: Focused;Sustained;Selective;Alternating Focused Attention: Appears intact Sustained Attention: Appears intact Selective Attention: Appears intact Alternating Attention: Appears intact Memory: Appears intact Awareness: Appears intact Problem Solving: Appears intact Safety/Judgment: Appears intact Comments: Pt and husband report decreased speed of cognitive processing at baseline 2/2 previous TBI in 2004 [place] of Cognitive Functioning: Purposeful/appropriate Sensation Sensation Light Touch: Appears Intact Proprioception: Appears Intact Coordination Gross Motor Movements are Fluid and Coordinated: No Coordination and Movement Description: Pt demonstrates gross ataxic movement  Motor  Motor Motor: Abnormal tone;Hemiplegia;Ataxia Motor - Skilled Clinical Observations: Mild hemi-plegia, ataxia and mild tone in R LE Mobility  Bed Mobility Bed Mobility: Supine to Sit;Sit to Supine;Sitting - Scoot to Edge of Bed Supine to Sit: 3: Mod assist Supine to  Sit Details: Verbal cues for sequencing;Verbal cues for technique;Tactile cues for placement Supine to Sit Details (indicate cue type and reason): Pt reports onset of dizziness with transitional movements. "I feel like I'm spinning"  Sitting - Scoot to Edge of Bed: 3: Mod assist;4: Min assist Sitting - Scoot to Edge of Bed Details: Verbal cues for technique;Verbal cues for sequencing;Tactile cues for placement Sit to Supine: 3: Mod assist;4: Min assist Sit to Supine - Details: Verbal cues for technique;Verbal cues for sequencing;Tactile cues for placement Transfers Transfers: Sit to Stand;Stand to Sit Sit to Stand: 4: Min assist;From bed;With armrests;From toilet Sit to Stand Details: Verbal cues for precautions/safety;Verbal cues for technique;Verbal  cues for sequencing Stand to Sit: 4: Min assist;To bed;With upper extremity assist;With armrests;To toilet Stand to Sit Details (indicate cue type and reason): Verbal cues for sequencing;Verbal cues for technique;Verbal cues for precautions/safety  Trunk/Postural Assessment  Cervical Assessment Cervical Assessment: Within Functional Limits Thoracic Assessment Thoracic Assessment: Within Functional Limits Lumbar Assessment Lumbar Assessment: Within Functional Limits Postural Control Postural Control: Deficits on evaluation Righting Reactions: delayed, pt demonstrats posterior lean in static stance that often worsens during mobility leading to intermittent LOB Protective Responses: delayed  Balance Balance Balance Assessed: Yes Static Sitting Balance Static Sitting - Balance Support: No upper extremity supported;Feet supported;Bilateral upper extremity supported Static Sitting - Level of Assistance: 5: Stand by assistance Static Sitting - Comment/# of Minutes: Pt reports dizziness w/ transitional changes, specifically sup<>sit, resolves w/ rest Dynamic Sitting Balance Dynamic Sitting - Balance Support: Right upper extremity supported;Left  upper extremity supported;Feet supported Dynamic Sitting - Level of Assistance: 5: Stand by assistance;4: Min assist Dynamic Sitting - Balance Activities: Lateral lean/weight shifting;Forward lean/weight shifting;Reaching across midline;Reaching for objects Static Standing Balance Static Standing - Balance Support: No upper extremity supported;Left upper extremity supported;Right upper extremity supported Static Standing - Level of Assistance: 4: Min assist Dynamic Standing Balance Dynamic Standing - Balance Support: Left upper extremity supported Dynamic Standing - Level of Assistance: 4: Min assist;3: Mod assist Dynamic Standing - Balance Activities: Lateral lean/weight shifting;Forward lean/weight shifting;Reaching for objects Extremity/Trunk Assessment RUE Assessment RUE Assessment:  (3-/5) LUE Assessment LUE Assessment:  (3-/5)  FIM:  FIM - Grooming Grooming Steps: Wash, rinse, dry face;Wash, rinse, dry hands;Oral care, brush teeth, clean dentures Grooming: 5: Set-up assist to apply toothpaste FIM - Bathing Bathing Steps Patient Completed: Chest;Right Arm;Left Arm;Abdomen;Front perineal area;Buttocks;Right upper leg;Left upper leg;Right lower leg (including foot);Left lower leg (including foot) Bathing: 4: Min-Patient completes 8-9 [id number] parts or 75+ percent FIM - Upper Body Dressing/Undressing Upper body dressing/undressing steps patient completed: Thread/unthread right sleeve of pullover shirt/dresss;Thread/unthread left sleeve of pullover shirt/dress;Put head through opening of pull over shirt/dress Upper body dressing/undressing: 4: Min-Patient completed 75 plus % of tasks FIM - Lower Body Dressing/Undressing Lower body dressing/undressing steps patient completed: Thread/unthread right pants leg Lower body dressing/undressing: 2: Max-Patient completed 25-49% of tasks FIM - BEngineer, siteAssistive Devices: Arm rests Bed/Chair Transfer: 3: Supine > Sit:  Mod A (lifting assist/Pt. 50-74%/lift 2 legs;3: Sit > Supine: Mod A (lifting assist/Pt. 50-74%/lift 2 legs);3: Chair or W/C > Bed: Mod A (lift or lower assist);3: Bed > Chair or W/C: Mod A (lift or lower assist) FIM - TSystems developerDevices: Shower chair;Grab bars Tub/shower Transfers: 4-Into Tub/Shower: Min A (steadying Pt. > 75%/lift 1 leg);4-Out of Tub/Shower: Min A (steadying Pt. > 75%/lift 1 leg)   Refer to Care Plan for Long Term Goals  Recommendations for other services: Neuropsych  Discharge Criteria: Patient will be discharged from OT if patient refuses treatment 3 consecutive times without medical reason, if treatment goals not met, if there is a change in medical status, if patient makes no progress towards goals or if patient is discharged from hospital.  The above assessment, treatment plan, treatment alternatives and goals were discussed and mutually agreed upon: by patient  SNicoletta Ba1/16/2015, 12:48 PM

## 2013-12-15 NOTE — Evaluation (Signed)
Physical Therapy Assessment and Plan  Patient Details  Name: Claudia Perez MRN: [id number] Date of Birth: May 07, 1951  PT Diagnosis: Abnormality of gait, Ataxic gait, Coordination disorder, Difficulty walking, R hemi-paresis, Vestibular involvement ? Hypertonia, Impaired cognition and Muscle weakness Rehab Potential: Good ELOS: 12-14 days   Today's Date: 12/15/2013 Time: Treatment Session 1: [id number] Time Calculation (min): Treatment Session 1: 60 min; Treatment Session 2: declined 27mn  Problem List:  Patient Active Problem List   Diagnosis Date Noted  . TBI (traumatic brain injury) 12/14/2013  . Fall down stairs 12/11/2013  . Basilar skull fracture 12/11/2013  . Traumatic intracranial hemorrhage 12/11/2013  . Back pain 12/11/2013  . Subarachnoid hemorrhage following injury 12/09/2013  . Syncope 03/27/2013  . Clavicular fracture 03/27/2013  . COUGH 12/28/2007    Past Medical History:  Past Medical History  Diagnosis Date  . Mobility impaired   . Hypercholesteremia   . Vitamin D deficiency   . Shortness of breath     "even now, just talking" (03/27/2013)  . Hyperthyroidism     "a little bit; don't take RX for it" (03/27/2013)  . GERD (gastroesophageal reflux disease)   . Seizures     "think I might have; been blacking out several times in the past" (03/27/2013)  . Fall at home 03/27/2013    "lost consciousness; fractured right clavicle" (03/27/2013)   Past Surgical History:  Past Surgical History  Procedure Laterality Date  . Appendectomy  1960's  . Eye surgery  02/2013    cataract extraction w/ intraocular lens implant  . Brain surgery  10/2003    SDH evac    Assessment & Plan Clinical Impression: Claudia Perez a 63y.o. female with history of seizures, SD[place] s[place]); who fell down 17 steps on 12/09/13 with subsequent TBI. Patient on no scheduled medications prior to admission. Fall  unwitnessed but patient reports dizziness prior to fall--no LOC or seizure activity. CT head with Nondisplaced left parietal skull fracture with large left parietal and occipital scalp hematomas, small SDH, right frontal intraparenchymal hemorrhage as well as SAH in bilateral frontal and right temporal region..Marland Kitchenhe was evaluated by Dr. HLuiz Ochoaand serial CCT recommended for monitoring. Follow up CCT 12/11/2013 with modest increase in hemorrhagic contusions. CT thoracic spine and cervical spine negative for fracture. Patient with complaints of head and neck pain as well as dizziness due to vertigo. Patient is tolerating a regular diet. Physical occupational therapy evaluations completed an ongoing with recommendations of physical medicine rehabilitation consult to consider inpatient rehabilitation services. Patient was felt to be a good candidate was admitted for comprehensive rehabilitation program. [facility]   Patient currently requires mod with mobility secondary to muscle weakness, decreased cardiorespiratoy endurance, abnormal tone, ataxia and decreased coordination, delayed processing and potential vestibular involvement.  Prior to hospitalization, patient was Independent with mobility and lived with Spouse;Family in a House home.  Home access is 3Stairs to enter.  Patient will benefit from skilled PT intervention to maximize safe functional mobility, minimize fall risk and decrease caregiver burden for planned discharge home with 24 hour supervision w/ intermittent assist.  [facility]  PT - End of Session Activity Tolerance: Tolerates 30+ min activity with multiple rests Endurance Deficit: Yes PT Assessment Rehab Potential: Good Barriers to Discharge: Decreased caregiver support PT Patient demonstrates impairments in the following area(s): Balance;Endurance;Motor;Safety;Other (comment) (  strength) PT Transfers  Functional Problem(s): Bed Mobility;Furniture;Floor;Car;Bed to Chair PT Locomotion Functional Problem(s): Ambulation;Wheelchair Mobility;Stairs PT Plan PT Intensity: Minimum of 1-2 x/day ,45 to 90 minutes PT Frequency: 5 out of 7 days PT Duration Estimated Length of Stay: 12-14 days PT Treatment/Interventions: Training and development officer;Ambulation/gait training;Discharge planning;Disease management/prevention;Functional mobility training;Patient/family education;Therapeutic Exercise;Visual/perceptual remediation/compensation;UE/LE Coordination activities;Therapeutic Activities;Neuromuscular re-education;Pain management;DME/adaptive equipment instruction;Stair training;UE/LE Strength taining/ROM;Wheelchair propulsion/positioning;Cognitive remediation/compensation;Community reintegration PT Transfers Anticipated Outcome(s): Supervision PT Locomotion Anticipated Outcome(s): Supervision to min A PT Recommendation Recommendations for Other Services: Vestibular eval Follow Up Recommendations: Home health PT Patient destination: Home Equipment Recommended: To be determined  Skilled Therapeutic Intervention Treatment Session 1: 1:1. Pt received semi-reclined in bed, ready for therapy. PT evaluation completed, see detailed objective information below. Pt A&Ox4, however, demonstrating decreased speed of cognitive processing which both pt and husband confirm is normal due to previous TBI. Pt req overall min-mod A w/ mobility and demonstrates good safety insight. Tx initiated w/ emphasis on safety during standing mobility w/ min-mod HHA. Pt demonstrating intermittent posterior LOB, specifically during stairs. Pt also demonstrating consistent dizziness w/ transitional movements, especially sup<>sit. Pt sitting in w/c at end of session w/ all needs in reach.  Treatment Session 2:  Pt declined 60 minutes of skilled physical therapy 2/2 fatigue. Therapist attempting x41mn to encourage pt to participate,  however, she continued to politely declined 2/2 fatigue. Pt semi-reclined in bed w/ all needs in reach, bed alarm on.   PT Evaluation Precautions/Restrictions Precautions Precautions: Fall Precaution Comments: Spoke with Dr HLuiz Ochoawho states no Vestibular testing for a week from today to allow more healing time prior to performing any quick head movements or dangling head off of bed.   Restrictions Weight Bearing Restrictions: No General Chart Reviewed: Yes Family/Caregiver Present: Yes (Husband) Vital SignsTherapy Vitals Temp: 97.5 F (36.4 C) Temp src: Oral Pulse Rate: 73 Resp: 17 BP: 114/59 mmHg Patient Position, if appropriate: Lying Oxygen Therapy SpO2: 95 % O2 Device: None (Room air) Pain   Home Living/Prior Functioning Home Living Available Help at Discharge: Available PRN/intermittently;Family Type of Home: House Home Access: Stairs to enter ETechnical brewerof Steps: 3 Entrance Stairs-Rails: None Home Layout: Two level Alternate Level Stairs-Number of Steps: flight Alternate Level Stairs-Rails: Right;Left;Can reach both Additional Comments: spouse works 12 to 8 pm; 151you dtr home at 5 pm. 63yo in college  Lives With: Spouse;Family Prior Function Level of Independence: Independent with homemaking with ambulation;Independent with gait;Independent with transfers;Needs assistance with ADLs;Other (comment) ((S) for shower 2/2 decreased balance)  Able to Take Stairs?: Yes Driving: No Vocation: On disability Vision/Perception  Vision - History Baseline Vision: Wears glasses only for reading Patient Visual Report: No change from baseline Vision - Assessment Eye Alignment: Within Functional Limits Vision Assessment: Vision tested Tracking/Visual Pursuits: Decreased smoothness of horizontal tracking;Requires cues, head turns, or add eye shifts to track Saccades: Overshoots;Additional eye shifts occurred during testing;Decreased speed of saccadic  movement Perception Perception: Within Functional Limits Praxis Praxis: Impaired Praxis Impairment Details: Motor planning  Cognition Overall Cognitive Status: History of cognitive impairments - at baseline Arousal/Alertness: Awake/alert Orientation Level: Oriented X4 Attention: Focused;Sustained;Selective;Alternating Focused Attention: Appears intact Sustained Attention: Appears intact Selective Attention: Appears intact Alternating Attention: Appears intact Memory: impaired, decreased recall of new information Awareness: Appears intact Problem Solving: impaired, function basic Safety/Judgment: Appears intact Comments: Pt and husband report decreased speed of cognitive processing at baseline 2/2 previous TBI in 2004. Difficult to differentiate between baseline vs. New cognitive impairments.  RCleona  of Cognitive Functioning: Purposeful/appropriate Sensation Sensation Light Touch: Appears Intact Proprioception: Appears Intact Coordination Gross Motor Movements are Fluid and Coordinated: No Coordination and Movement Description: Pt demonstrates gross ataxic movement  Motor  Motor Motor: Abnormal tone;Hemiplegia;Ataxia Motor - Skilled Clinical Observations: Mild hemi-plegia, ataxia and mild tone in R LE  Mobility Bed Mobility Bed Mobility: Supine to Sit;Sit to Supine;Sitting - Scoot to Edge of Bed Supine to Sit: 3: Mod assist Supine to Sit Details: Verbal cues for sequencing;Verbal cues for technique;Tactile cues for placement Supine to Sit Details (indicate cue type and reason): Pt reports onset of dizziness with transitional movements. "I feel like I'm spinning"  Sitting - Scoot to Edge of Bed: 3: Mod assist;4: Min assist Sitting - Scoot to Edge of Bed Details: Verbal cues for technique;Verbal cues for sequencing;Tactile cues for placement Sit to Supine: 3: Mod assist;4: Min assist Sit to Supine - Details: Verbal cues for technique;Verbal cues for  sequencing;Tactile cues for placement Transfers Transfers: Yes Sit to Stand: 4: Min assist;From bed;With armrests;From toilet Sit to Stand Details: Verbal cues for precautions/safety;Verbal cues for technique;Verbal cues for sequencing Stand to Sit: 4: Min assist;To bed;With upper extremity assist;With armrests;To toilet Stand to Sit Details (indicate cue type and reason): Verbal cues for sequencing;Verbal cues for technique;Verbal cues for precautions/safety Stand Pivot Transfers: 3: Mod assist Stand Pivot Transfer Details: Verbal cues for sequencing;Verbal cues for technique;Verbal cues for precautions/safety Locomotion  Ambulation Ambulation: Yes Ambulation/Gait Assistance: 3: Mod assist;4: Min assist Ambulation Distance (Feet): 125 Feet Assistive device: 1 person hand held assist Ambulation/Gait Assistance Details: Verbal cues for precautions/safety;Verbal cues for gait pattern Gait Gait: Yes Gait Pattern: Impaired Gait Pattern: Decreased dorsiflexion - right;Decreased dorsiflexion - left;Decreased hip/knee flexion - left;Decreased hip/knee flexion - right;Ataxic;Lateral trunk lean to right;Wide base of support;Right foot flat;Left foot flat;Decreased step length - left;Decreased step length - right;Step-through pattern Gait velocity: deccreased, rigid weight shifts and entended B LE, trunk extended Stairs / Additional Locomotion Stairs: Yes Stairs Assistance: 3: Mod assist Stairs Assistance Details: Verbal cues for sequencing;Verbal cues for technique;Verbal cues for precautions/safety Stairs Assistance Details (indicate cue type and reason): Posterior LOB when descending steps Stair Management Technique: Two rails;Step to pattern;Alternating pattern Number of Stairs: 10 Wheelchair Mobility Wheelchair Mobility: Yes Wheelchair Assistance: 3: Building surveyor Details: Verbal cues for precautions/safety;Verbal cues for Marketing executive: Both upper  extremities Wheelchair Parts Management: Needs assistance Distance: 40'  Trunk/Postural Assessment  Cervical Assessment Cervical Assessment: Within Functional Limits Thoracic Assessment Thoracic Assessment: Within Functional Limits Lumbar Assessment Lumbar Assessment: Within Functional Limits Postural Control Postural Control: Deficits on evaluation Righting Reactions: delayed, pt demonstrats posterior lean in static stance that often worsens during mobility leading to intermittent LOB Protective Responses: delayed  Balance Balance Balance Assessed: Yes Static Sitting Balance Static Sitting - Balance Support: No upper extremity supported;Feet supported;Bilateral upper extremity supported Static Sitting - Level of Assistance: 5: Stand by assistance Static Sitting - Comment/# of Minutes: Pt reports dizziness w/ transitional changes, specifically sup<>sit, resolves w/ rest Dynamic Sitting Balance Dynamic Sitting - Balance Support: Right upper extremity supported;Left upper extremity supported;Feet supported Dynamic Sitting - Level of Assistance: 5: Stand by assistance;4: Min assist Dynamic Sitting - Balance Activities: Lateral lean/weight shifting;Forward lean/weight shifting;Reaching across midline;Reaching for objects Static Standing Balance Static Standing - Balance Support: No upper extremity supported;Left upper extremity supported;Right upper extremity supported Static Standing - Level of Assistance: 4: Min assist Dynamic Standing Balance Dynamic Standing - Balance Support: Left upper extremity supported Dynamic  Standing - Level of Assistance: 4: Min assist;3: Mod assist Dynamic Standing - Balance Activities: Lateral lean/weight shifting;Forward lean/weight shifting;Reaching for objects Extremity Assessment  RUE Assessment RUE Assessment:  (3-/5) LUE Assessment LUE Assessment:  (3-/5) RLE Assessment RLE Assessment: Exceptions to [facility] RLE Strength RLE Overall Strength  Comments: Grossly 4/5  RLE Tone RLE Tone: Mild LLE Assessment LLE Assessment: Within Functional Limits (Grossly 4+/5)  FIM:  FIM - Bed/Chair Transfer Bed/Chair Transfer Assistive Devices: Arm rests Bed/Chair Transfer: 3: Supine > Sit: Mod A (lifting assist/Pt. 50-74%/lift 2 legs;3: Sit > Supine: Mod A (lifting assist/Pt. 50-74%/lift 2 legs);3: Chair or W/C > Bed: Mod A (lift or lower assist);3: Bed > Chair or W/C: Mod A (lift or lower assist) FIM - Locomotion: Wheelchair Distance: 40' Locomotion: Wheelchair: 1: Travels less than 50 ft with moderate assistance (Pt: 50 - 74%) FIM - Locomotion: Ambulation Locomotion: Ambulation Assistive Devices: Other (comment) (HHA) Ambulation/Gait Assistance: 3: Mod assist;4: Min assist Locomotion: Ambulation: 2: Travels 50 - 149 ft with moderate assistance (Pt: 50 - 74%) FIM - Locomotion: Stairs Locomotion: Scientist, physiological: Hand rail - 2 Locomotion: Stairs: 2: Up and Down 4 - 11 stairs with moderate assistance (Pt: 50 - 74%)   Refer to Care Plan for Long Term Goals  Recommendations for other services: Other: Vestibular Eval  Discharge Criteria: Patient will be discharged from PT if patient refuses treatment 3 consecutive times without medical reason, if treatment goals not met, if there is a change in medical status, if patient makes no progress towards goals or if patient is discharged from hospital.  The above assessment, treatment plan, treatment alternatives and goals were discussed and mutually agreed upon: by patient and by family  Gilmore Laroche 12/15/2013, 3:51 PM

## 2013-12-15 NOTE — Progress Notes (Signed)
Patient information reviewed and entered into eRehab system by Brnadon Eoff, RN, CRRN, PPS Coordinator.  Information including medical coding and functional independence measure will be reviewed and updated through discharge.     Per nursing patient was given "Data Collection Information Summary for Patients in Inpatient Rehabilitation Facilities with attached "Privacy Act Statement-Health Care Records" upon admission.  

## 2013-12-15 NOTE — Progress Notes (Signed)
Physical Therapy Note  Patient Details  Name: Emmilynn Marut MRN: [id number] Date of Birth: 1951-03-12 Today's Date: 12/15/2013  Patient missed 30 minutes of skilled physical therapy this PM secondary to refusal to participate without medical reason. Patient c/o fatigue and states that she is "very sorry, but just too tired." Patient left supine in bed with husband at bedside, all needs within reach. Will follow up as able.   [place] Brentyn Seehafer, PT, DPT 12/15/2013, 2:13 PM

## 2013-12-15 NOTE — Plan of Care (Signed)
Problem: RH PAIN MANAGEMENT Goal: RH STG PAIN MANAGED AT OR BELOW PT'S PAIN GOAL Outcome: Progressing Has head and neck pain.wbb

## 2013-12-15 NOTE — Evaluation (Signed)
Speech Language Pathology Assessment and Plan  Patient Details  Name: Claudia Perez MRN: [id number] Date of Birth: 20-Jun-1951  SLP Diagnosis: Cognitive Impairments;Dysarthria  Rehab Potential: Excellent ELOS: 12-14 days   Today's Date: 12/15/2013 Time: 0930-1025 Time Calculation (min): 55 min  Skilled Therapeutic Intervention: Administered cognitive-linguistic evaluation. Please see below for details. Also educated pt and her husband on pt's current cognitive function and goals of skilled SLP intervention. Both verbalized understanding.   Problem List:  Patient Active Problem List   Diagnosis Date Noted  . TBI (traumatic brain injury) 12/14/2013  . Fall down stairs 12/11/2013  . Basilar skull fracture 12/11/2013  . Traumatic intracranial hemorrhage 12/11/2013  . Back pain 12/11/2013  . Subarachnoid hemorrhage following injury 12/09/2013  . Syncope 03/27/2013  . Clavicular fracture 03/27/2013  . COUGH 12/28/2007   Past Medical History:  Past Medical History  Diagnosis Date  . Mobility impaired   . Hypercholesteremia   . Vitamin D deficiency   . Shortness of breath     "even now, just talking" (03/27/2013)  . Hyperthyroidism     "a little bit; don't take RX for it" (03/27/2013)  . GERD (gastroesophageal reflux disease)   . Seizures     "think I might have; been blacking out several times in the past" (03/27/2013)  . Fall at home 03/27/2013    "lost consciousness; fractured right clavicle" (03/27/2013)   Past Surgical History:  Past Surgical History  Procedure Laterality Date  . Appendectomy  1960's  . Eye surgery  02/2013    cataract extraction w/ intraocular lens implant  . Brain surgery  10/2003    SDH evac    Assessment / Plan / Recommendation Clinical Impression  Pt is a 63 y.o. 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 Patient  on no scheduled medications prior to admission. Fall unwitnessed but patient reports dizziness prior to fall--no LOC or seizure activity. CT head with Nondisplaced left parietal skull fracture with large left parietal and occipital scalp hematomas, small SDH, right frontal intraparenchymal hemorrhage as well as SAH in bilateral frontal and right temporal region.Marland KitchenShe was evaluated by Dr. Luiz Ochoa and serial CCT recommended for monitoring. Follow up CCT 12/11/2013 with modest increase in hemorrhagic contusions. CT thoracic spine and cervical spine negative for fracture. Patient with complaints of head and neck pain as well as dizziness due to vertigo. Patient is tolerating a regular diet. Physical occupational therapy evaluations completed an ongoing with recommendations of physical medicine rehabilitation consult to consider inpatient rehabilitation services. Patient was felt to be a good candidate was admitted for comprehensive rehabilitation program. [facility] and presents with mild cognitive impairments characterized by decreased working memory and functional problem solving, however, difficult to differentiate between baseline deficits from previous TBI vs. new cognitive impairments from current TBI. Overall pt is demonstrates behaviors consistent with a [place] Level VII.  Pt also has mild ataxic dysarthria which impacts pt's overall functional communication at the conversation level. Pt would benefit from skilled SLP intervention to maximize cognitive function and functional communication to maximize pt's overall functional independence. Anticipate patient will benefit from follow up [facility] at discharge and 24 hour supervision.     SLP Assessment  Patient will need skilled [place] [facility] Services during CIR admission    Recommendations  Recommendations for Other Services: Neuropsych consult Patient destination: Home  Follow up Recommendations: Home Health SLP;24 hour  supervision/assistance Equipment Recommended: None recommended by SLP    SLP Frequency 5 out of 7 days   SLP Treatment/Interventions Cognitive remediation/compensation;Cueing hierarchy;Functional tasks;Environmental controls;Internal/external aids;Patient/family education;Therapeutic Activities    Pain No/Denies Pain  Prior Functioning Type of Home: House  Lives With: Spouse;Family Available Help at Discharge: Available PRN/intermittently;Family  Short Term Goals: Week 1: SLP Short Term Goal 1 (Week 1): Pt will utilize external memory aids to recall new, daily information with supervision question cues.  SLP Short Term Goal 2 (Week 1): Pt will demonstrate functional problem solving for basic and familiar tasks with supervision verbal cues.  SLP Short Term Goal 3 (Week 1): Pt will utilize speech intelligibility strategies at the conversation level with Mod I.   See FIM for current functional status Refer to Care Plan for Long Term Goals  Recommendations for other services: Neuropsych  Discharge Criteria: Patient will be discharged from SLP if patient refuses treatment 3 consecutive times without medical reason, if treatment goals not met, if there is a change in medical status, if patient makes no progress towards goals or if patient is discharged from hospital.  The above assessment, treatment plan, treatment alternatives and goals were discussed and mutually agreed upon: by patient and by family  Claudia Perez 12/15/2013, 3:32 PM

## 2013-12-16 ENCOUNTER — Inpatient Hospital Stay (HOSPITAL_COMMUNITY): Payer: Medicare Other

## 2013-12-16 ENCOUNTER — Inpatient Hospital Stay (HOSPITAL_COMMUNITY): Payer: Medicare Other | Admitting: Occupational Therapy

## 2013-12-16 ENCOUNTER — Inpatient Hospital Stay (HOSPITAL_COMMUNITY): Payer: Medicare Other | Admitting: Speech Pathology

## 2013-12-16 DIAGNOSIS — S069XAA Unspecified intracranial injury with loss of consciousness status unknown, initial encounter: Secondary | ICD-10-CM

## 2013-12-16 DIAGNOSIS — S069X9A Unspecified intracranial injury with loss of consciousness of unspecified duration, initial encounter: Secondary | ICD-10-CM

## 2013-12-16 NOTE — Progress Notes (Signed)
Physical Therapy Session Note  Patient Details  Name: Claudia Perez MRN: [id number] Date of Birth: [birth year]  Today's Date: 12/16/2013 Time: Treatment Session 1: 2585-2778; Treatment Session 2: 1430-1530 Time Calculation (min): Treatment Session 1: 55 min; Treatment Session 2: 35min  Skilled Therapeutic Interventions/Progress Updates:  Treatment Session 1:  1:1. Pt received sitting in w/c, ready for therapy. Focus this session on gait raining as well as standing balance. Trial use of weighted RW for encouraged anterior weight shift and increased feedback. Pt able to amb 75'x2 w/ min-mod A, demonstrating step-to pattern w/ L LE lead and therapist facilitating reciprocal advancement of R LE. Berg Balance Test performed, pt scored 10/56. Pt req assistance for marjority of tasks 2/2 persistent posterior R lean attributing to low score. Played 2 rounds of horseshoes to encourage weight shift to L while reaching across midline w/ R UE to obtain horseshoe as well as facilitation at hips to encourage anterior weight shift. When sustaining anterior lean, therapist providing decreased assist for balance of min-occasional mod A. Pt req seated rest throughout session 2/2 fatigue. W/c propulsion x175' at end of session to target reciprocal movement in B LE. Pt sitting in w/c at end of session w/ all needs in reach and husband in room.   Treatment Session 2:  1:1. Pt received sitting in w/c, ready for therapy. Pt able to propel w/c 175'x2 to target reciprocal movement in B LE. Practiced slow t/f sit<>stand multiple times to encourage maintaining anterior weight shift to prevent posterior LOB during standing, improvement noted w/ repetition and therapist able to decrease from mod A to min guard assist. Negotiation up/down 20 steps w/ B rails and min A to simulate accessibility of second floor in home environment. No posterior LOB noted when put cued to keep "nose over toes" especially going down. In therapy kitchen,  emphasis on problem solving management of dishes counter<>table while maintaining balance. Pt bringing over single items at a time (w/out cue) to maintain 1 free hand to support self as needed, therapist providing min A overall. Noted decreased ROM in R shoulder when reaching into cabinets, pt confirmed decreased ROM prior to fall. NuStep utilized, level 3x69min, to target B LE NMR. Pt sitting in w/c at end of session w/ all needs in reach.   Therapy Documentation Precautions:  Precautions Precautions: Fall Precaution Comments: Spoke with Dr Luiz Ochoa who states no Vestibular testing for a week from today to allow more healing time prior to performing any quick head movements or dangling head off of bed.   Restrictions Weight Bearing Restrictions: No Vital Signs: Therapy Vitals Temp: 98.1 F (36.7 C) Temp src: Oral Pulse Rate: 73 Resp: 16 BP: 128/76 mmHg Patient Position, if appropriate: Lying Oxygen Therapy SpO2: 73 % O2 Device: None (Room air) Pain: Pain Assessment Pain Assessment: 0-10 Pain Score: 6  Pain Type: Acute pain Pain Location: Head Pain Descriptors / Indicators: Headache Pain Onset: On-going Pain Intervention(s): Distraction (Pt reports receiving pain medicine prior to this session)  See FIM for current functional status  Therapy/Group: Individual Therapy  Gilmore Laroche 12/16/2013, 12:03 PM

## 2013-12-16 NOTE — Progress Notes (Signed)
Speech Language Pathology Daily Session Note  Patient Details  Name: Claudia Perez MRN: [id number] Date of Birth: 14-Aug-1951  Today's Date: 12/16/2013 Time: 1006-1036 Time Calculation (min): 30 min  Short Term Goals: Week 1: SLP Short Term Goal 1 (Week 1): Pt will utilize external memory aids to recall new, daily information with supervision question cues.  SLP Short Term Goal 2 (Week 1): Pt will demonstrate functional problem solving for basic and familiar tasks with supervision verbal cues.  SLP Short Term Goal 3 (Week 1): Pt will utilize speech intelligibility strategies at the conversation level with Mod I.   Skilled Therapeutic Interventions: Therapeutic intervention complete, with short term goals addressed.  The patient required min A to use surroundings to recall new information.  She also required min A assistance to solve basic activities.  Continue with current treatment plan.    FIM:  Comprehension Comprehension Mode: Auditory Comprehension: 5-Follows basic conversation/direction: With no assist Expression Expression Mode: Verbal Expression: 5-Expresses basic needs/ideas: With no assist Social Interaction Social Interaction: 5-Interacts appropriately 90% of the time - Needs monitoring or encouragement for participation or interaction. Problem Solving Problem Solving: 4-Solves basic 75 - 89% of the time/requires cueing 10 - 24% of the time Memory Memory: 4-Recognizes or recalls 75 - 89% of the time/requires cueing 10 - 24% of the time FIM - Eating Eating Activity: 7: Complete independence:no helper  Pain Pain Assessment Pain Assessment: No/denies pain  Therapy/Group: Individual Therapy  Frances Maywood 12/16/2013, 4:33 PM

## 2013-12-16 NOTE — Progress Notes (Addendum)
Occupational Therapy Session Note  Patient Details  Name: Claudia Perez MRN: [id number] Date of Birth: 13-Oct-1951  Today's Date: 12/16/2013 Time: 2482-5003 Time Calculation (min): 45 min  Short Term Goals: Week 1:  OT Short Term Goal 1 (Week 1): Pt will transfer to toilet with steady A  OT Short Term Goal 2 (Week 1): Pt will dress LB with min A  OT Short Term Goal 3 (Week 1): Pt will demonstrate dynamic standing balance with min A during functional tasks without UE support  OT Short Term Goal 4 (Week 1): Pt will ambulate with LRAD to obtain clothing for dressing with min  A for balance  Skilled Therapeutic Interventions/Progress Updates: Patient completed ADL in w/c at sink with difficulty going supine to EOB (max & cues required) and difficulty with balance sit to stand, initially.  Patient was able to stand and use bilateral UE balancing with left at times on sink while washing with R hand.  Was able to stand for about 2 minutes before losing balance and gently plopping back into chair.  She stated that her legs were weak and not helping her stay upright.  She also reported slight dizziness.   Though patient presented with what she described as bilateral shoulder decreased ROM due to "overexercised and messed myself up,"  She was able to don blouse with extra time.   Patient will benefit from more opportunities for dynamic and static balance and endurance and increasing UE bilateral shoulder flexion.   Patient made very polite and positive comments during the session in spite of her self care limitations.     Therapy Documentation Precautions:  Precautions Precautions: Fall Precaution Comments: Spoke with Dr Luiz Ochoa who states no Vestibular testing for a week from today to allow more healing time prior to performing any quick head movements or dangling head off of bed.   Restrictions Weight Bearing Restrictions: No  Pain:6/10 constant frontal head, bilateral neck, and bilateral gluts ("it's  from where I fell").  RN brought pain meds   See FIM for current functional status  Therapy/Group: Individual Therapy  Alfredia Ferguson [facility] 12/16/2013, 7:59 AM

## 2013-12-16 NOTE — Progress Notes (Signed)
Subjective/Complaints: Complains of headache. Ultram seems to help.Headaches mostly frontal in location. No bowel or bladder c/os A 12 point review of systems has been performed and if not noted above is otherwise negative.   Objective: Vital Signs: Blood pressure 128/76, pulse 73, temperature 98.1 F (36.7 C), temperature source Oral, resp. rate 16, height 5\' 1"  (1.549 m), weight 51.3 kg (113 lb 1.5 oz), SpO2 73.00%. No results found.  Recent Labs  12/15/13 0526  WBC 9.4  HGB 12.4  HCT 34.4*  PLT 262    Recent Labs  12/15/13 0526  NA 127*  K 4.1  CL 92*  GLUCOSE 109*  BUN 8  CREATININE 0.52  CALCIUM 8.9   CBG (last 3)  No results found for this basename: GLUCAP,  in the last 72 hours  Wt Readings from Last 3 Encounters:  12/14/13 51.3 kg (113 lb 1.5 oz)  12/10/13 49.9 kg (110 lb 0.2 oz)  03/27/13 54.069 kg (119 lb 3.2 oz)    Physical Exam:  Constitutional: She is oriented to person, place, and time. She appears well-developed and well-nourished. No distress HENT:  Head: Normocephalic.  Eyes: Conjunctivae are normal. Pupils are equal, round, and reactive to light.  Neck: Normal range of motion.  Cardiovascular: Normal rate and regular rhythm.  Respiratory: Effort normal and breath sounds normal. No respiratory distress. She has no wheezes.  GI: Soft. Bowel sounds are normal. She exhibits no distension. There is no tenderness.  Musculoskeletal: She exhibits no edema and no tenderness.  Neurological: She is alert and oriented to person, place, and time.  Ataxic, halting speech. Follows basic commands without difficulty. Good recall with fair insight and awareness. Dysmetria LUE>RUE. Decrease heel to shin BLE as well. Decreased fine motor bilateral upper. Strength grossly 3+ to 4-/5 with more weakness in the left upper.   Psychiatric: She has a normal mood and affect. Her behavior is normal     Assessment/Plan: 1. Functional deficits secondary to traumatic  brain injury with skull fx which require 3+ hours per day of interdisciplinary therapy in a comprehensive inpatient rehab setting. Physiatrist is providing close team supervision and 24 hour management of active medical problems listed below. Physiatrist and rehab team continue to assess barriers to discharge/monitor patient progress toward functional and medical goals.   FIM: FIM - Bathing Bathing Steps Patient Completed: Chest;Right Arm;Left Arm;Abdomen;Front perineal area Bathing: 4: Min-Patient completes 8-9 64f 10 parts or 75+ percent  FIM - Upper Body Dressing/Undressing Upper body dressing/undressing steps patient completed: Thread/unthread right sleeve of pullover shirt/dresss;Thread/unthread left sleeve of pullover shirt/dress;Put head through opening of pull over shirt/dress Upper body dressing/undressing: 4: Min-Patient completed 75 plus % of tasks FIM - Lower Body Dressing/Undressing Lower body dressing/undressing steps patient completed: Thread/unthread right pants leg Lower body dressing/undressing: 2: Max-Patient completed 25-49% of tasks  FIM - Toileting Toileting steps completed by patient: Adjust clothing prior to toileting;Performs perineal hygiene;Adjust clothing after toileting Toileting: 5: Supervision: Safety issues/verbal cues  FIM - Radio producer Devices: Grab bars Toilet Transfers: 4-To toilet/BSC: Min A (steadying Pt. > 75%);5-From toilet/BSC: Supervision (verbal cues/safety issues)  FIM - Engineer, site Assistive Devices: Arm rests Bed/Chair Transfer: 2: Supine > Sit: Max A (lifting assist/Pt. 25-49%);4: Bed > Chair or W/C: Min A (steadying Pt. > 75%)  FIM - Locomotion: Wheelchair Distance: 40' Locomotion: Wheelchair: 1: Travels less than 50 ft with moderate assistance (Pt: 50 - 74%) FIM - Locomotion: Ambulation Locomotion: Ambulation Assistive Devices: Other (comment) (  HHA) Ambulation/Gait Assistance:  3: Mod assist;4: Min assist Locomotion: Ambulation: 2: Travels 50 - 149 ft with moderate assistance (Pt: 50 - 74%)  Comprehension Comprehension Mode: Auditory Comprehension: 5-Follows basic conversation/direction: With no assist  Expression Expression Mode: Verbal Expression: 5-Expresses basic needs/ideas: With no assist  Social Interaction Social Interaction: 5-Interacts appropriately 90% of the time - Needs monitoring or encouragement for participation or interaction.  Problem Solving Problem Solving: 4-Solves basic 75 - 89% of the time/requires cueing 10 - 24% of the time  Memory Memory: 4-Recognizes or recalls 75 - 89% of the time/requires cueing 10 - 24% of the time  Medical Problem List and Plan:  1. 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 DVT Prophylaxis/Anticoagulation: SCDs. Monitor for any signs of DVT  3. Pain Management: Ultram and Robaxin as needed. Monitor with increased mobility   -add topamax for prophylaxis, 25mg  4. Neuropsych: This patient is capable of making decisions on her own behalf.  5. Hyponatremia: begin mild fluid restriction  -recheck Na+ tomorrow LOS (Days) 2 A FACE TO FACE EVALUATION WAS PERFORMED  Claudia Perez 12/16/2013 9:10 AM

## 2013-12-17 NOTE — Progress Notes (Signed)
Subjective/Complaints: Complains of headache. Ultram seems to help.Headaches mostly frontal in location.no sinus drainage No bowel or bladder c/os A 12 point review of systems has been performed and if not noted above is otherwise negative.   Objective: Vital Signs: Blood pressure 118/75, pulse 80, temperature 98.2 F (36.8 C), temperature source Oral, resp. rate 16, height 5\' 1"  (1.549 m), weight 51.3 kg (113 lb 1.5 oz), SpO2 98.00%. No results found.  Recent Labs  12/15/13 0526  WBC 9.4  HGB 12.4  HCT 34.4*  PLT 262    Recent Labs  12/15/13 0526  NA 127*  K 4.1  CL 92*  GLUCOSE 109*  BUN 8  CREATININE 0.52  CALCIUM 8.9   CBG (last 3)  No results found for this basename: GLUCAP,  in the last 72 hours  Wt Readings from Last 3 Encounters:  12/14/13 51.3 kg (113 lb 1.5 oz)  12/10/13 49.9 kg (110 lb 0.2 oz)  03/27/13 54.069 kg (119 lb 3.2 oz)    Physical Exam:  Constitutional: She is oriented to person, place, and time. She appears well-developed and well-nourished. No distress HENT:  Head: Normocephalic. No tenderness over frontal sinuses Eyes: Conjunctivae are normal. Pupils are equal, round, and reactive to light.  Neck: Normal range of motion.  Cardiovascular: Normal rate and regular rhythm.  Respiratory: Effort normal and breath sounds normal. No respiratory distress. She has no wheezes.  GI: Soft. Bowel sounds are normal. She exhibits no distension. There is no tenderness.  Musculoskeletal: She exhibits no edema and no tenderness.  Neurological: She is alert and oriented to person, place, and time.  Ataxic, halting speech. Follows basic commands without difficulty. Good recall with fair insight and awareness. Dysmetria LUE>RUE. Decrease heel to shin BLE as well. Decreased fine motor bilateral upper. Strength grossly 3+ to 4-/5 with more weakness in the left upper.   Psychiatric: She has a normal mood and affect. Her behavior is normal      Assessment/Plan: 1. 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 Physiatrist is providing close team supervision and 24 hour management of active medical problems listed below. Physiatrist and rehab team continue to assess barriers to discharge/monitor patient progress toward functional and medical goals.   FIM: FIM - Bathing Bathing Steps Patient Completed: Chest;Right Arm;Left Arm;Abdomen;Front perineal area Bathing: 4: Min-Patient completes 8-9 52f 10 parts or 75+ percent  FIM - Upper Body Dressing/Undressing Upper body dressing/undressing steps patient completed: Thread/unthread left sleeve of pullover shirt/dress;Put head through opening of pull over shirt/dress;Pull shirt over trunk;Thread/unthread right sleeve of pullover shirt/dresss Upper body dressing/undressing: 5: Supervision: Safety issues/verbal cues (limited shoulder flexion; able to dress with extra time) FIM - Lower Body Dressing/Undressing Lower body dressing/undressing steps patient completed: Thread/unthread right underwear leg;Thread/unthread left underwear leg;Pull underwear up/down;Thread/unthread right pants leg;Pull pants up/down;Fasten/unfasten pants;Don/Doff right sock Lower body dressing/undressing: 3: Mod-Patient completed 50-74% of tasks  FIM - Toileting Toileting steps completed by patient: Adjust clothing prior to toileting;Performs perineal hygiene;Adjust clothing after toileting Toileting: 5: Supervision: Safety issues/verbal cues  FIM - Radio producer Devices: Grab bars Toilet Transfers: 4-To toilet/BSC: Min A (steadying Pt. > 75%);5-From toilet/BSC: Supervision (verbal cues/safety issues)  FIM - Engineer, site Assistive Devices: Arm rests Bed/Chair Transfer: 4: Bed > Chair or W/C: Min A (steadying Pt. > 75%);4: Chair or  W/C > Bed: Min A (steadying Pt. > 75%)  FIM - Locomotion: Wheelchair Distance: 40' Locomotion: Wheelchair: 4: Travels 150 ft or more: maneuvers on rugs and over door sillls with minimal assistance (Pt.>75%) FIM - Locomotion: Ambulation Locomotion: Ambulation Assistive Devices: Walker - Rolling;Other (comment) (HHA) Ambulation/Gait Assistance: 3: Mod assist;4: Min assist Locomotion: Ambulation: 2: Travels 50 - 149 ft with moderate assistance (Pt: 50 - 74%)  Comprehension Comprehension Mode: Auditory Comprehension: 5-Follows basic conversation/direction: With no assist  Expression Expression Mode: Verbal Expression: 5-Expresses basic needs/ideas: With no assist  Social Interaction Social Interaction: 5-Interacts appropriately 90% of the time - Needs monitoring or encouragement for participation or interaction.  Problem Solving Problem Solving: 4-Solves basic 75 - 89% of the time/requires cueing 10 - 24% of the time  Memory Memory: 4-Recognizes or recalls 75 - 89% of the time/requires cueing 10 - 24% of the time  Medical Problem List and Plan:  1. 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 DVT Prophylaxis/Anticoagulation: SCDs. Monitor for any signs of DVT  3. Pain Management: Ultram and Robaxin as needed. Monitor with increased mobility   -add topamax for prophylaxis, 25mg  4. Neuropsych: This patient is capable of making decisions on her own behalf.  5. Hyponatremia: begin mild fluid restriction  -recheck Na+ tomorrow LOS (Days) 3 A FACE TO FACE EVALUATION WAS PERFORMED  Charlett Blake 12/17/2013 7:42 AM

## 2013-12-18 ENCOUNTER — Encounter (HOSPITAL_COMMUNITY): Payer: Medicare Other

## 2013-12-18 ENCOUNTER — Inpatient Hospital Stay (HOSPITAL_COMMUNITY): Payer: Medicare Other | Admitting: Speech Pathology

## 2013-12-18 ENCOUNTER — Inpatient Hospital Stay (HOSPITAL_COMMUNITY): Payer: Medicare Other

## 2013-12-18 LAB — BASIC METABOLIC PANEL
BUN: 11 mg/dL (ref 6–23)
CALCIUM: 9.3 mg/dL (ref 8.4–10.5)
CO2: 23 mEq/L (ref 19–32)
Chloride: 92 mEq/L — ABNORMAL LOW (ref 96–112)
Creatinine, Ser: 0.7 mg/dL (ref 0.50–1.10)
GFR calc non Af Amer: >90 mL/min (ref >90)
Glucose, Bld: 146 mg/dL — ABNORMAL HIGH (ref 70–99)
POTASSIUM: 4.1 meq/L (ref 3.7–5.3)
SODIUM: 131 meq/L — AB (ref 137–147)

## 2013-12-18 NOTE — Progress Notes (Signed)
Inpatient [facility] Individual Statement of Services  Patient Name:  Claudia Perez  Date:  12/18/2013  Welcome to the [facility].  Our goal is to provide you with an individualized program based on your diagnosis and situation, designed to meet your specific needs.  With this comprehensive rehabilitation program, you will be expected to participate in at least 3 hours of rehabilitation therapies Monday-Friday, with modified therapy programming on the weekends.  Your rehabilitation program will include the following services:  Physical Therapy (PT), Occupational Therapy (OT), Speech Therapy (ST), 24 hour per day rehabilitation nursing, Therapeutic Recreaction (TR), Neuropsychology, Case Management (Social Worker), Rehabilitation Medicine, Nutrition Services and Pharmacy Services  Weekly team conferences will be held on Tuesdays to discuss your progress.  Your Social Worker will talk with you frequently to get your input and to update you on team discussions.  Team conferences with you and your family in attendance may also be held.  Expected length of stay: 10-14 days  Overall anticipated outcome: modified ind @ wheelchair level  Depending on your progress and recovery, your program may change. Your Social Worker will coordinate services and will keep you informed of any changes. Your Social Worker's name and contact numbers are listed  below.  The following services may also be recommended but are not provided by the [facility] will be made to provide these services after discharge if needed.  Arrangements include referral to agencies that provide these services.  Your insurance has been verified to be:  Medicare and [place] Your primary doctor is:  Dr. Drema Dallas  Pertinent information will be shared with your doctor and your  insurance company.  Social Worker:  [place], [place] or ([phone]   Information discussed with and copy given to patient by: Lennart Pall, 12/18/2013, 9:41 AM

## 2013-12-18 NOTE — Progress Notes (Signed)
Physical Therapy Session Note  Patient Details  Name: Claudia Perez MRN: [id number] Date of Birth: 1951/07/12  Today's Date: 12/18/2013 Time: Treatment Session 1: 1100-1155; Treatment Session 2: 7341-9379; Treatment Session 3: 1530-1600 Time Calculation (min): Treatment Session 1: 55 min;Treatment Session 2: 33min; Treatment Session 3: 83min  Short Term Goals: Week 1:  PT Short Term Goal 1 (Week 1): Pt to perform bed mobility w/ supervision PT Short Term Goal 2 (Week 1): Pt to perform transfers w/ min A PT Short Term Goal 3 (Week 1): Pt to amb 100' w/ LRAD and min A PT Short Term Goal 4 (Week 1): Pt to negotiate up/down 10 steps w/ single rail and min A  Skilled Therapeutic Interventions/Progress Updates:  Treatment Session 1:  1:1. Pt received sitting in w/c, ready for therapy. Focus this session on ambulation, functional tasks in therapy apartment and B LE NMR. Pt able to amb 75'x2 pushing weighted cart for stability and able to demonstrate reciprocal step pattern, consistent anterior lean req min A overall. In therapy apartment, practice making bed for emphasis on dynamic standing balance, req min-mod A 2/2 intermittent posterior LOB. During t/f sit<>stand x10 reps pt cued to place hands on knees to encourage anterior lean w/ min guard assist. Standing tolerance limited throughout session 2/2 pain in R hip w/ R leg buckling x3 req mod A for correction of balance. Use of NuStep for B LE NMR, level 4x10 min, good tolerance overall. Pt sitting in w/c at end of session w/ all needs in reach and husband in room.   Treatment Session 2:  1:1. Pt received sitting in w/c, ready for therapy. Focus this session on ambulation, functional transfers and dynamic sitting balance. Trial amb w/ weighted RW for increased stability, pt req decreased physical assist vs. HHA but decreased fluidity. Continued emphasis on t/f sit<>stand w/ hands on knees to encourage anterior weight shift. Dynamic balance challenged by  sitting on red air disc playing 2 rounds of horseshoes, reaching outside BOS. Pt demonstrates difficulty terminating grasp to release horseshoes. Pt sitting in w/c at end of session w/ all needs in reach and husband in room.   Treatment Session 3:  1:1. Pt received sitting in w/c, initially declining therapy 2/2 fatigue but agreeable to supine therex for hip strengthening. Min HHA for SPT w/c>bed, mod A for t/f sit>sup. Supine therex included 2x10 reps of: glute sets, B LE bridging, single LE bridging, hip add squeezes, resisted hip abd, heel presses. Pt declined remaining 19min 2/2 fatigue. Pt semi-reclined in bed w/ bed alarm on, all needs w/in reach and husband in room.   Therapy Documentation Precautions:  Precautions Precautions: Fall Precaution Comments: Spoke with Dr Luiz Ochoa who states no Vestibular testing for a week from today to allow more healing time prior to performing any quick head movements or dangling head off of bed.   Restrictions Weight Bearing Restrictions: No   Pain: Pain Assessment Pain Assessment: 0-10 Pain Score: 7  Pain Location: Hip Pain Orientation: Right Pain Intervention(s): RN made aware;Repositioned;Distraction  See FIM for current functional status  Therapy/Group: Individual Therapy  Gilmore Laroche 12/18/2013, 12:08 PM

## 2013-12-18 NOTE — Plan of Care (Signed)
Problem: RH PAIN MANAGEMENT Goal: RH OTHER STG PAIN MANAGEMENT GOALS W/ASSIST Other STG Pain Management Goals With Assistance.  Neck pain, heat effective

## 2013-12-18 NOTE — Progress Notes (Signed)
Subjective/Complaints: Headaches are better. Had a pretty uneventful weekend.  No bowel or bladder c/os A 12 point review of systems has been performed and if not noted above is otherwise negative.   Objective: Vital Signs: Blood pressure 123/71, pulse 72, temperature 98.5 F (36.9 C), temperature source Oral, resp. rate 16, height 5\' 1"  (1.549 m), weight 51.3 kg (113 lb 1.5 oz), SpO2 97.00%. No results found. No results found for this basename: WBC, HGB, HCT, PLT,  in the last 72 hours No results found for this basename: NA, K, CL, CO, GLUCOSE, BUN, CREATININE, CALCIUM,  in the last 72 hours CBG (last 3)  No results found for this basename: GLUCAP,  in the last 72 hours  Wt Readings from Last 3 Encounters:  12/14/13 51.3 kg (113 lb 1.5 oz)  12/10/13 49.9 kg (110 lb 0.2 oz)  03/27/13 54.069 kg (119 lb 3.2 oz)    Physical Exam:  Constitutional: She is oriented to person, place, and time. She appears well-developed and well-nourished. No distress HENT:  Head: Normocephalic. No tenderness over frontal sinuses Eyes: Conjunctivae are normal. Pupils are equal, round, and reactive to light.  Neck: Normal range of motion.  Cardiovascular: Normal rate and regular rhythm.  Respiratory: Effort normal and breath sounds normal. No respiratory distress. She has no wheezes.  GI: Soft. Bowel sounds are normal. She exhibits no distension. There is no tenderness.  Musculoskeletal: She exhibits no edema and no tenderness.  Neurological: She is alert and oriented to person, place, and time.  Ataxic, halting speech. Follows basic commands without difficulty. Good recall with fair insight and awareness. Dysmetria LUE>RUE. Decrease heel to shin BLE as well. Decreased fine motor bilateral upper. Strength grossly 3+ to 4-/5 with more weakness in the left upper.   Psychiatric: She has a normal mood and affect. Her behavior is normal     Assessment/Plan: 1. Functional deficits secondary to traumatic  brain injury with skull fx which require 3+ hours per day of interdisciplinary therapy in a comprehensive inpatient rehab setting. Physiatrist is providing close team supervision and 24 hour management of active medical problems listed below. Physiatrist and rehab team continue to assess barriers to discharge/monitor patient progress toward functional and medical goals.   FIM: FIM - Bathing Bathing Steps Patient Completed: Chest;Right Arm;Left Arm;Abdomen;Front perineal area Bathing: 4: Min-Patient completes 8-9 77f 10 parts or 75+ percent  FIM - Upper Body Dressing/Undressing Upper body dressing/undressing steps patient completed: Thread/unthread left sleeve of pullover shirt/dress;Put head through opening of pull over shirt/dress;Pull shirt over trunk;Thread/unthread right sleeve of pullover shirt/dresss Upper body dressing/undressing: 5: Supervision: Safety issues/verbal cues (limited shoulder flexion; able to dress with extra time) FIM - Lower Body Dressing/Undressing Lower body dressing/undressing steps patient completed: Thread/unthread right underwear leg;Thread/unthread left underwear leg;Pull underwear up/down;Thread/unthread right pants leg;Pull pants up/down;Fasten/unfasten pants;Don/Doff right sock Lower body dressing/undressing: 3: Mod-Patient completed 50-74% of tasks  FIM - Toileting Toileting steps completed by patient: Adjust clothing prior to toileting;Performs perineal hygiene;Adjust clothing after toileting Toileting Assistive Devices: Grab bar or rail for support Toileting: 4: Steadying assist  FIM - Radio producer Devices: Grab bars Toilet Transfers: 4-To toilet/BSC: Min A (steadying Pt. > 75%);4-From toilet/BSC: Min A (steadying Pt. > 75%)  FIM - Bed/Chair Transfer Bed/Chair Transfer Assistive Devices: Arm rests Bed/Chair Transfer: 4: Chair or W/C > Bed: Min A (steadying Pt. > 75%);4: Bed > Chair or W/C: Min A (steadying Pt. > 75%)  FIM  - Locomotion: Wheelchair Distance: 40' Locomotion:  Wheelchair: 4: Travels 150 ft or more: maneuvers on rugs and over door sillls with minimal assistance (Pt.>75%) FIM - Locomotion: Ambulation Locomotion: Ambulation Assistive Devices: Walker - Rolling;Other (comment) (HHA) Ambulation/Gait Assistance: 3: Mod assist;4: Min assist Locomotion: Ambulation: 2: Travels 50 - 149 ft with moderate assistance (Pt: 50 - 74%)  Comprehension Comprehension Mode: Auditory Comprehension: 5-Understands complex 90% of the time/Cues < 10% of the time  Expression Expression Mode: Verbal Expression: 5-Expresses basic needs/ideas: With no assist  Social Interaction Social Interaction: 5-Interacts appropriately 90% of the time - Needs monitoring or encouragement for participation or interaction.  Problem Solving Problem Solving: 4-Solves basic 75 - 89% of the time/requires cueing 10 - 24% of the time  Memory Memory: 4-Recognizes or recalls 75 - 89% of the time/requires cueing 10 - 24% of the time  Medical Problem List and Plan:  1. 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 DVT Prophylaxis/Anticoagulation: SCDs. Monitor for any signs of DVT  3. Pain Management: Ultram and Robaxin as needed. Monitor with increased mobility   -added topamax for prophylaxis, 25mg  which has helped 4. Neuropsych: This patient is capable of making decisions on her own behalf.  5. Hyponatremia: begin mild fluid restriction  -recheck Na+ today  LOS (Days) 4 A FACE TO FACE EVALUATION WAS PERFORMED  Linzy Laury T 12/18/2013 8:10 AM

## 2013-12-18 NOTE — Progress Notes (Signed)
Speech Language Pathology Daily Session Note  Patient Details  Name: Claudia Perez MRN: [id number] Date of Birth: 1950/12/28  Today's Date: 12/18/2013 Time: 1300-1330 Time Calculation (min): 30 min  Short Term Goals: Week 1: SLP Short Term Goal 1 (Week 1): Pt will utilize external memory aids to recall new, daily information with supervision question cues.  SLP Short Term Goal 2 (Week 1): Pt will demonstrate functional problem solving for basic and familiar tasks with supervision verbal cues.  SLP Short Term Goal 3 (Week 1): Pt will utilize speech intelligibility strategies at the conversation level with Mod I.   Skilled Therapeutic Interventions: Treatment focus on cognitive goals. SLP facilitated session by providing Min A-supervision verbal cues during reading comprehension tasks to increase recall and problem solving throughout the task. Pt also performed diaphragmatic exercises with supervision verbal and visual cues. Continue plan of care.    FIM:  Comprehension Comprehension Mode: Auditory Comprehension: 5-Understands basic 90% of the time/requires cueing < 10% of the time Expression Expression Mode: Verbal Expression: 4-Expresses basic 75 - 89% of the time/requires cueing 10 - 24% of the time. Needs helper to occlude trach/needs to repeat words. Social Interaction Social Interaction: 5-Interacts appropriately 90% of the time - Needs monitoring or encouragement for participation or interaction. Problem Solving Problem Solving: 4-Solves basic 75 - 89% of the time/requires cueing 10 - 24% of the time Memory Memory: 4-Recognizes or recalls 75 - 89% of the time/requires cueing 10 - 24% of the time  Pain Pain Assessment Pain Assessment: No/denies pain  Therapy/Group: Individual Therapy  Ostin Mathey, [place] 12/18/2013, 4:39 PM

## 2013-12-18 NOTE — Progress Notes (Signed)
Occupational Therapy Session Note  Patient Details  Name: Claudia Perez MRN: [id number] Date of Birth: 1951/02/05  Today's Date: 12/18/2013 Time: 0900-0955 Time Calculation (min): 55 min  Short Term Goals: Week 1:  OT Short Term Goal 1 (Week 1): Pt will transfer to toilet with steady A  OT Short Term Goal 2 (Week 1): Pt will dress LB with min A  OT Short Term Goal 3 (Week 1): Pt will demonstrate dynamic standing balance with min A during functional tasks without UE support  OT Short Term Goal 4 (Week 1): Pt will ambulate with LRAD to obtain clothing for dressing with min  A for balance  Skilled Therapeutic Interventions/Progress Updates:    Pt was in bathroom upon arrival.  Pt participated in bath/dress act at sink level seated in wc.  Pt required set up for activity and performed UB/LB bath/dress with min assist to supervision.  Pt required assist in steadying gait when pulling up pants.  Pt demonstrated ataxic movements when performing BADL tasks throughout session and was able to complete tasks with persistence.    Pt had no c/o pain.  Focus: Increasing independence in ADL performance, increasing activity tolerance and endurance and safety awareness.   Therapy Documentation Precautions:  Precautions Precautions: Fall Precaution Comments: Spoke with Dr Luiz Ochoa who states no Vestibular testing for a week from today to allow more healing time prior to performing any quick head movements or dangling head off of bed.   Restrictions Weight Bearing Restrictions: No  See FIM for current functional status  Therapy/Group: Individual Therapy  Shipley,Sheila 12/18/2013, 10:14 AM  Note reviewed and accurately reflects treatment session.

## 2013-12-18 NOTE — Progress Notes (Signed)
Social Work  Social Work Assessment and Plan  Patient Details  Name: Claudia Perez MRN: [id number] Date of Birth: 05-29-1951  Today's Date: 12/17/2013  Problem List:  Patient Active Problem List   Diagnosis Date Noted  . TBI (traumatic brain injury) 12/14/2013  . Fall down stairs 12/11/2013  . Basilar skull fracture 12/11/2013  . Traumatic intracranial hemorrhage 12/11/2013  . Back pain 12/11/2013  . Subarachnoid hemorrhage following injury 12/09/2013  . Syncope 03/27/2013  . Clavicular fracture 03/27/2013  . COUGH 12/28/2007   Past Medical History:  Past Medical History  Diagnosis Date  . Mobility impaired   . Hypercholesteremia   . Vitamin D deficiency   . Shortness of breath     "even now, just talking" (03/27/2013)  . Hyperthyroidism     "a little bit; don't take RX for it" (03/27/2013)  . GERD (gastroesophageal reflux disease)   . Seizures     "think I might have; been blacking out several times in the past" (03/27/2013)  . Fall at home 03/27/2013    "lost consciousness; fractured right clavicle" (03/27/2013)   Past Surgical History:  Past Surgical History  Procedure Laterality Date  . Appendectomy  1960's  . Eye surgery  02/2013    cataract extraction w/ intraocular lens implant  . Brain surgery  10/2003    [facility] evac   Social History:  reports that she has never smoked. She has never used smokeless tobacco. She reports that she drinks alcohol. She reports that she does not use illicit drugs.  Family / Support Systems Marital Status: Married How Long?: 22 yrs Patient Roles: Spouse;Parent Spouse/Significant Other: husband, Lendell Caprice @ (H) [phone] or (C) [phone] Children: pt and husband have one daughter, 57 yrs old, Kefey Other Supports: pt's 4 yrs old sister, Sinibu Assefa, is living with pt and family but attending school and working  Anticipated Caregiver: family Ability/Limitations of Caregiver: spouse works 12 noon until 8 pm Caregiver Availability:  Intermittent Family Dynamics: pt describes a very good marital relationship and excellent support from husband and other family  Social History Preferred language: English Religion: Catholic Cultural Background: pt originally from [place] having moved to [place]. 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  Speech was primary limitations, however, she is able to self monitor/ correct if not clear.  Denies any emotional distress at all.  States she is very motivated to regain as much independence as possible. Recent Psychosocial Issues: Prior TBI 2004 but was able to function faily independently, however, no driving and has had some falls due to balance issues. Pyschiatric History: None Substance Abuse History: None  Patient / Family Perceptions, Expectations & Goals Pt/Family understanding of illness & functional limitations: Pt and family with very good understanding of her injury/ 2nd TBI.  good understanding of the purpose of CIR. Premorbid pt/family roles/activities: Pt was independent overall with some falls due to balance.  Involved mother and wife who had worked hard to recover from her first TBI.  She was still managing family finances as well. Anticipated  changes in roles/activities/participation: pt may now require 24/7 supervision/ assist.  Husband, daughter and sister may need to increase amount of assist they have been  providing. Pt/family expectations/goals: Pt hopeful she can reach a level that she can maintain some level of independence and be safe at home alone.  Community Resources [employer]: None Premorbid Home Care/DME Agencies: None Transportation available at discharge: yes Resource referrals recommended: Neuropsychology;Support group (specify)  Discharge Planning Living Arrangements: Spouse/significant other;Children;Other (Comment) (62 and 51 yo daughters) Support Systems: Spouse/significant other;Children;Other relatives;Friends/neighbors Type of Residence: Private residence Insurance Resources: Education officer, museum (specify) Writer) Museum/gallery curator Resources: SSD;Family Support Financial Screen Referred: No Living Expenses: Higher education careers adviser Management: Patient Does the patient have any problems obtaining your medications?: No Home Management: pt and family Patient/Family Preliminary Plans: Pt plans to return home with her family who can only provide intermittent assist Barriers to Discharge: Family Support;Steps Social Work Anticipated Follow Up Needs: HH/OP;Support Group Expected length of stay: 10-14 days  Clinical Impression Very pleasant, motivated woman here after a fall and suffering her 2nd TBI (1st in 2004.  Good family support,however, not 24/7 availability.  Concern may have had some poor safety awareness PTA (thus leading to fall).  Will need to address safest set up for her at home which may be w/c level if alone.  No emotional distress, however, will monitor.  Jenene Kauffmann 12/17/2013, 12:40 PM

## 2013-12-19 ENCOUNTER — Encounter (HOSPITAL_COMMUNITY): Payer: Medicare Other

## 2013-12-19 ENCOUNTER — Inpatient Hospital Stay (HOSPITAL_COMMUNITY): Payer: Medicare Other | Admitting: Occupational Therapy

## 2013-12-19 ENCOUNTER — Inpatient Hospital Stay (HOSPITAL_COMMUNITY): Payer: Medicare Other | Admitting: Speech Pathology

## 2013-12-19 ENCOUNTER — Inpatient Hospital Stay (HOSPITAL_COMMUNITY): Payer: Medicare Other | Admitting: Physical Therapy

## 2013-12-19 DIAGNOSIS — S069XAA Unspecified intracranial injury with loss of consciousness status unknown, initial encounter: Secondary | ICD-10-CM

## 2013-12-19 DIAGNOSIS — S069X9A Unspecified intracranial injury with loss of consciousness of unspecified duration, initial encounter: Secondary | ICD-10-CM

## 2013-12-19 NOTE — Progress Notes (Signed)
Physical Therapy Note  Patient Details  Name: Claudia Perez MRN: [id number] Date of Birth: 14-Aug-1951 Today's Date: 12/19/2013  Time: 1400-1500 60 minutes  Group therapy:  No c/o pain.  Initial c/o muscle spasm in LEs, resolved with seated rest. Pt participated in group therapy session focusing on LE strength and endurance.  Pt performed seated and standing therex B for strengthening, sit to stands with min A, short distance gait with min A HHA, nu step x 10 minutes level 3.  Pt requires frequent rests but is motivated to improve her performance.   Sanjuan Sawa 12/19/2013, 5:40 PM

## 2013-12-19 NOTE — Progress Notes (Signed)
Occupational Therapy Session Note  Patient Details  Name: Claudia Perez MRN: [id number] Date of Birth: Jun 02, 1951  Today's Date: 12/19/2013 Time: 6256-3893 Time Calculation (min): 45 min  Short Term Goals: Week 1:  OT Short Term Goal 1 (Week 1): Pt will transfer to toilet with steady A  OT Short Term Goal 2 (Week 1): Pt will dress LB with min A  OT Short Term Goal 3 (Week 1): Pt will demonstrate dynamic standing balance with min A during functional tasks without UE support  OT Short Term Goal 4 (Week 1): Pt will ambulate with LRAD to obtain clothing for dressing with min  A for balance  Skilled Therapeutic Interventions/Progress Updates:  Patient resting in w/c upon arrival.  Engaged in propelling w/c backwards with BLEs, simple kitchen cooking task standing and without AD, simple HM task to include fold then put away clothes in drawer as well as hang up clothes on hangers.  Focused session on activity tolerance, dynamic balance during functional mobility with IADL tasks, motor control-especially while transporting breakable cup of water to microwave then transporting cup of hot tea across kitchen.  Patient able to perform these tasks without assistance for control and for balance.   Patient required occasional assist during sit><stand and vcs for a slow and controlled descent into the chair.  Therapy Documentation Precautions:  Precautions Precautions: Fall Precaution Comments: Spoke with Dr Luiz Ochoa who states no Vestibular testing for a week from today to allow more healing time prior to performing any quick head movements or dangling head off of bed.   Restrictions Weight Bearing Restrictions: No Pain: No reports of pain Therapy/Group: Individual Therapy  Zhanna Melin 12/19/2013, 4:29 PM

## 2013-12-19 NOTE — Evaluation (Signed)
Speech Language Pathology Bedside Swallow Evaluation   Patient Details  Name: Taygan Connell MRN: [id number] Date of Birth: 07/04/1951  Today's Date: 12/19/2013 Time: 9826-4158 Time Calculation (min): 45 min  Problem List:  Patient Active Problem List   Diagnosis Date Noted  . TBI (traumatic brain injury) 12/14/2013  . Fall down stairs 12/11/2013  . Basilar skull fracture 12/11/2013  . Traumatic intracranial hemorrhage 12/11/2013  . Back pain 12/11/2013  . Subarachnoid hemorrhage following injury 12/09/2013  . Syncope 03/27/2013  . Clavicular fracture 03/27/2013  . COUGH 12/28/2007   Past Medical History:  Past Medical History  Diagnosis Date  . Mobility impaired   . Hypercholesteremia   . Vitamin D deficiency   . Shortness of breath     "even now, just talking" (03/27/2013)  . Hyperthyroidism     "a little bit; don't take RX for it" (03/27/2013)  . GERD (gastroesophageal reflux disease)   . Seizures     "think I might have; been blacking out several times in the past" (03/27/2013)  . Fall at home 03/27/2013    "lost consciousness; fractured right clavicle" (03/27/2013)   Past Surgical History:  Past Surgical History  Procedure Laterality Date  . Appendectomy  1960's  . Eye surgery  02/2013    cataract extraction w/ intraocular lens implant  . Brain surgery  10/2003    [facility] evac    Assessment / Plan / Recommendation Clinical Impression  Pt administered BSE today due to pt reporting a "tight jaw" with occasional biting of her cheek and tongue. Pt consumed regular textures and demonstrated efficient mastication with minimal oral residue that cleared with a spontaneous liquid wash. Pt also required cueing for small bites and to decreased impulsivity with self-feeding. Pt also consumed thin liquids via cup and straw with an intermittent throat clear X 2 with multiple whole pills. Recommend pt continue with current diet of regular textures with thin liquids. Pt does not need f/u  for swallowing function at this time.                 Pain In right hip, pt repositioned and RN made aware and administered medications  Short Term Goals: Week 1: SLP Short Term Goal 1 (Week 1): Pt will utilize external memory aids to recall new, daily information with supervision question cues.  SLP Short Term Goal 2 (Week 1): Pt will demonstrate functional problem solving for basic and familiar tasks with supervision verbal cues.  SLP Short Term Goal 3 (Week 1): Pt will utilize speech intelligibility strategies at the conversation level with Mod I.   See FIM for current functional status Refer to Care Plan for Long Term Goals   Discharge Criteria: Patient will be discharged from SLP if patient refuses treatment 3 consecutive times without medical reason, if treatment goals not met, if there is a change in medical status, if patient makes no progress towards goals or if patient is discharged from hospital.  The above assessment, treatment plan, treatment alternatives and goals were discussed and mutually agreed upon: by patient  Nyjah Schwake 12/19/2013, 1:10 PM

## 2013-12-19 NOTE — Progress Notes (Signed)
Subjective/Complaints: Feeling fairly well. Still has some issues with balance. Right hip sore at times. A 12 point review of systems has been performed and if not noted above is otherwise negative.   Objective: Vital Signs: Blood pressure 111/68, pulse 75, temperature 99 F (37.2 C), temperature source Oral, resp. rate 18, height 5\' 1"  (1.549 m), weight 51.3 kg (113 lb 1.5 oz), SpO2 95.00%. No results found. No results found for this basename: WBC, HGB, HCT, PLT,  in the last 72 hours  Recent Labs  12/18/13 0935  NA 131*  K 4.1  CL 92*  GLUCOSE 146*  BUN 11  CREATININE 0.70  CALCIUM 9.3   CBG (last 3)  No results found for this basename: GLUCAP,  in the last 72 hours  Wt Readings from Last 3 Encounters:  12/14/13 51.3 kg (113 lb 1.5 oz)  12/10/13 49.9 kg (110 lb 0.2 oz)  03/27/13 54.069 kg (119 lb 3.2 oz)    Physical Exam:  Constitutional: She is oriented to person, place, and time. She appears well-developed and well-nourished. No distress HENT:  Head: Normocephalic. No tenderness over frontal sinuses Eyes: Conjunctivae are normal. Pupils are equal, round, and reactive to light.  Neck: Normal range of motion.  Cardiovascular: Normal rate and regular rhythm.  Respiratory: Effort normal and breath sounds normal. No respiratory distress. She has no wheezes.  GI: Soft. Bowel sounds are normal. She exhibits no distension. There is no tenderness.  Musculoskeletal: She exhibits no edema. Right hip/troch moderately tender to palpation and ROM Neurological: She is alert and oriented to person, place, and time.  Ataxic, halting speech. Follows basic commands without difficulty. Good recall with fair insight and awareness. Dysmetria LUE>RUE. Decrease heel to shin BLE as well. Decreased fine motor bilateral upper. Strength grossly 3+ to 4-/5 with more weakness in the left upper.   Psychiatric: She has a normal mood and affect. Her behavior is normal     Assessment/Plan: 1.  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 Physiatrist is providing close team supervision and 24 hour management of active medical problems listed below. Physiatrist and rehab team continue to assess barriers to discharge/monitor patient progress toward functional and medical goals.   FIM: FIM - Bathing Bathing Steps Patient Completed: Chest;Right Arm;Left Arm;Abdomen;Right upper leg;Left upper leg;Right lower leg (including foot);Left lower leg (including foot) Bathing: 4: Min-Patient completes 8-9 74f 10 parts or 75+ percent  FIM - Upper Body Dressing/Undressing Upper body dressing/undressing steps patient completed: Thread/unthread right sleeve of pullover shirt/dresss;Thread/unthread left sleeve of pullover shirt/dress;Put head through opening of pull over shirt/dress;Pull shirt over trunk Upper body dressing/undressing: 5: Set-up assist to: Obtain clothing/put away FIM - Lower Body Dressing/Undressing Lower body dressing/undressing steps patient completed: Pull pants up/down;Don/Doff right sock;Don/Doff left sock;Thread/unthread left pants leg;Thread/unthread right pants leg Lower body dressing/undressing: 4: Steadying Assist  FIM - Toileting Toileting steps completed by patient: Adjust clothing prior to toileting;Performs perineal hygiene;Adjust clothing after toileting Toileting Assistive Devices: Grab bar or rail for support Toileting: 4: Steadying assist  FIM - Radio producer Devices: Grab bars Toilet Transfers: 4-To toilet/BSC: Min A (steadying Pt. > 75%);4-From toilet/BSC: Min A (steadying Pt. > 75%)  FIM - Bed/Chair Transfer Bed/Chair Transfer Assistive Devices: Arm rests Bed/Chair Transfer: 3: Sit > Supine: Mod A (lifting assist/Pt. 50-74%/lift 2 legs);4: Chair or W/C > Bed: Min A (steadying Pt. > 75%);4: Bed >  Chair  or W/C: Min A (steadying Pt. > 75%)  FIM - Locomotion: Wheelchair Distance: 40' Locomotion: Wheelchair: 1: Total Assistance/staff pushes wheelchair (Pt<25%) FIM - Locomotion: Ambulation Locomotion: Ambulation Assistive Devices: Walker - Rolling;Other (comment) (HHA) Ambulation/Gait Assistance: 3: Mod assist;4: Min assist Locomotion: Ambulation: 2: Travels 50 - 149 ft with moderate assistance (Pt: 50 - 74%)  Comprehension Comprehension Mode: Auditory Comprehension: 5-Understands complex 90% of the time/Cues < 10% of the time  Expression Expression Mode: Verbal Expression: 5-Expresses basic needs/ideas: With no assist  Social Interaction Social Interaction: 5-Interacts appropriately 90% of the time - Needs monitoring or encouragement for participation or interaction.  Problem Solving Problem Solving: 4-Solves basic 75 - 89% of the time/requires cueing 10 - 24% of the time  Memory Memory: 4-Recognizes or recalls 75 - 89% of the time/requires cueing 10 - 24% of the time  Medical Problem List and Plan:  1. 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 DVT Prophylaxis/Anticoagulation: SCDs. Monitor for any signs of DVT  3. Pain Management: Ultram and Robaxin as needed. Monitor with increased mobility   -added topamax for prophylaxis, 25mg  which has helped  -ice to right hip, stretching 4. Neuropsych: This patient is capable of making decisions on her own behalf.  5. Hyponatremia:continue mild fluid restriction  -sodium slowly recovering  -recheck Thursday  LOS (Days) 5 A FACE TO FACE EVALUATION WAS PERFORMED  SWARTZ,ZACHARY T 12/19/2013 8:15 AM

## 2013-12-19 NOTE — Progress Notes (Signed)
Occupational Therapy Session Note  Patient Details  Name: Claudia Perez MRN: [id number] Date of Birth: November 01, 1951  Today's Date: 12/19/2013 Time: 4103-0131 Time Calculation (min): 45 min  Short Term Goals: Week 1:  OT Short Term Goal 1 (Week 1): Pt will transfer to toilet with steady A  OT Short Term Goal 2 (Week 1): Pt will dress LB with min A  OT Short Term Goal 3 (Week 1): Pt will demonstrate dynamic standing balance with min A during functional tasks without UE support  OT Short Term Goal 4 (Week 1): Pt will ambulate with LRAD to obtain clothing for dressing with min  A for balance  Skilled Therapeutic Interventions/Progress Updates:    Pt was in bed upon arrival.  Pt ambulated with steady assist and without AD to shower but was unable to shower in stall due to lack of hot water.  Pt then completed bath/dress activity at sink level while seated in wc.  Pt required set up and safety cues to complete all bathing activities.  Pt demonstrated ataxic movements during activity and required extra time to complete tasks.  Pt had no c/o pain.  Focus: Increasing independence in ADL's, safety awareness, increasing endurance and activity tolerance.   Therapy Documentation Precautions:  Precautions Precautions: Fall Precaution Comments: Spoke with Dr Luiz Ochoa who states no Vestibular testing for a week from today to allow more healing time prior to performing any quick head movements or dangling head off of bed.   Restrictions Weight Bearing Restrictions: No Pain: Pain Assessment Pain Assessment: No/denies pain  See FIM for current functional status  Therapy/Group: Individual Therapy  Shipley,Sheila 12/19/2013, 10:36 AM  Note reviewed and accurately reflects treatment session.

## 2013-12-20 ENCOUNTER — Encounter (HOSPITAL_COMMUNITY): Payer: Medicare Other

## 2013-12-20 ENCOUNTER — Inpatient Hospital Stay (HOSPITAL_COMMUNITY): Payer: Medicare Other | Admitting: Speech Pathology

## 2013-12-20 ENCOUNTER — Inpatient Hospital Stay (HOSPITAL_COMMUNITY): Payer: Medicare Other

## 2013-12-20 NOTE — Progress Notes (Signed)
Social Work Patient ID: Ulis Rias, female   DOB: 1951/07/01, 63 y.o.   MRN: [id number]  Claudia Pall, LCSW Social Worker Signed  Patient Care Conference Service date: 12/20/2013 2:44 PM  Inpatient RehabilitationTeam Conference and Plan of Care Update Date: 12/19/2013   Time: 2:50 PM     Patient Name: Claudia Perez       Medical Record Number: [id number]   Date of Birth: 1950-12-30 Sex: Female         Room/Bed: 4M07C/4M07C-01 Payor Info: Payor: MEDICARE / Plan: MEDICARE PART A AND B / Product Type: *No Product type* /   Admitting Diagnosis: TBI   Admit Date/Time:  12/14/2013  6:20 PM Admission Comments: No comment available   Primary Diagnosis:  <principal problem not specified> Principal Problem: <principal problem not specified>    Patient Active Problem List     Diagnosis  Date Noted   .  TBI (traumatic brain injury)  12/14/2013   .  Fall down stairs  12/11/2013   .  Basilar skull fracture  12/11/2013   .  Traumatic intracranial hemorrhage  12/11/2013   .  Back pain  12/11/2013   .  Subarachnoid hemorrhage following injury  12/09/2013   .  Syncope  03/27/2013   .  Clavicular fracture  03/27/2013   .  COUGH  12/28/2007     Expected Discharge Date: Expected Discharge Date: 12/26/13  Team Members Present: Physician leading conference: Dr. Alger Simons Social Worker Present: Claudia Pall, LCSW Nurse Present: Other (comment) Salena Saner, RN) PT Present: Melene Plan, PT OT Present: Roanna Epley, [place], OT;Karen [place], OT SLP Present: Weston Anna, SLP PPS Coordinator present : Daiva Nakayama, RN, CRRN        Current Status/Progress  Goal  Weekly Team Focus   Medical     tbi with skull fx after fall, balance issues, pain  improving pain control and balance to improve functional mobility  improve pain control, headaches   Bowel/Bladder     continent of bowel and bladder  remains continent of bowel and bladder w/ regular BMs  assess bowel and bladder function  daily,Q shift   Swallow/Nutrition/ Hydration            ADL's     min A overall, decreased activity tolerance  supervision overall  activity tolerance, transfers, safety awareness   Mobility     Min-Mod A  Mod(I)-Supervision  standing balance, functional endurance, safety, trial ADs   Communication     Supervision  Mod I  diaphragmatic breathing, speech intelligibility    Safety/Cognition/ Behavioral Observations    Min A  Supervision  working memory, functional problem solving    Pain     c/o head ache w/ relief from robaxin prn  pain relief  assess for pain and for relief    Skin     intact  remins intact      Rehab Goals Patient on target to meet rehab goals: Yes *See Care Plan and progress notes for long and short-term goals.    Barriers to Discharge:  premorbid deficits, pain      Possible Resolutions to Barriers:    NMR, pain control, patient education      Discharge Planning/Teaching Needs:    home with family to provide intermittent assistance      Team Discussion:    Ataxic somewhat improved.  Making good progress overall.  Will definitely need to use walker at home for safety.  Hoping to reach mod i  w/c level.  Uncertain how close to baseline pt is with cognition.  Little insight from husband.   Revisions to Treatment Plan:    None    Continued Need for Acute Rehabilitation Level of Care: The patient requires daily medical management by a physician with specialized training in physical medicine and rehabilitation for the following conditions: Daily direction of a multidisciplinary physical rehabilitation program to ensure safe treatment while eliciting the highest outcome that is of practical value to the patient.: Yes Daily analysis of laboratory values and/or radiology reports with any subsequent need for medication adjustment of medical intervention for : Neurological problems;Other  [name] 12/20/2013, 2:44 PM

## 2013-12-20 NOTE — Progress Notes (Signed)
Occupational Therapy Session Note  Patient Details  Name: Claudia Perez MRN: [id number] Date of Birth: 15-Jun-1951  Today's Date: 12/20/2013 Time: 0920-1015 Time Calculation (min): 55 min  Short Term Goals: Week 1:  OT Short Term Goal 1 (Week 1): Pt will transfer to toilet with steady A  OT Short Term Goal 2 (Week 1): Pt will dress LB with min A  OT Short Term Goal 3 (Week 1): Pt will demonstrate dynamic standing balance with min A during functional tasks without UE support  OT Short Term Goal 4 (Week 1): Pt will ambulate with LRAD to obtain clothing for dressing with min  A for balance  Skilled Therapeutic Interventions/Progress Updates:    Pt was in wc upon arrival.  Pt willingly utilized RW to ambulate and  transfer to shower chair with mod A.  Pt doffed UB/LB clothing while seated at shower chair and completed UB/LB bathing while seated.  Pt stood utilizing grab bars and min A to wash back side.  Pt exhibits ataxic movements and requires extra time to bath/dress. Pt donned UB/LB clothing while seated in shower chair and then functionally ambulated back to wc utilizing RW and mod A.  Pt was educated on the use of a sock aid and was able to utilize equipment to donn socks after education. Pt had no c/o pain.   Focus: Increasing independence in ADL performance, safety awareness, education of AE, and increasing activity tolerance.    Therapy Documentation Precautions:  Precautions Precautions: Fall Precaution Comments: Spoke with Dr Luiz Ochoa who states no Vestibular testing for a week from today to allow more healing time prior to performing any quick head movements or dangling head off of bed.   Restrictions Weight Bearing Restrictions: No  See FIM for current functional status  Therapy/Group: Individual Therapy  Shipley,Sheila 12/20/2013, 10:55 AM  Note reviewed and accurately reflects treatment session.

## 2013-12-20 NOTE — Progress Notes (Signed)
Social Work Patient ID: Claudia Perez, female   DOB: Sep 21, 1951, 63 y.o.   MRN: [id number]  Have reviewed team conference with pt who is agreeable with targeted d/c date of 1/27.  Need to review with husband in the morning.  Need to stress to both need for walker at home and concerns about safety overall.    Kenndra Morris, LCSW

## 2013-12-20 NOTE — Progress Notes (Signed)
Speech Language Pathology Daily Session Note  Patient Details  Name: Nancylee Gaines MRN: [id number] Date of Birth: February 01, 1951  Today's Date: 12/20/2013 Time: 0830-0900 Time Calculation (min): 30 min  Short Term Goals: Week 1: SLP Short Term Goal 1 (Week 1): Pt will utilize external memory aids to recall new, daily information with supervision question cues.  SLP Short Term Goal 2 (Week 1): Pt will demonstrate functional problem solving for basic and familiar tasks with supervision verbal cues.  SLP Short Term Goal 3 (Week 1): Pt will utilize speech intelligibility strategies at the conversation level with Mod I.   Skilled Therapeutic Interventions: Skilled treatment session focused on cognitive goals. Patient perform diaphragmatic breathing exercises with Supervision.  SLP facilitated session by providing Min A question cues to recall and utilize diaphragmatic breathing during structured conversation.  Patient also required Min assist to utilize schedule to accurately recall daily events. Continue plan of care.   FIM:  Comprehension Comprehension Mode: Auditory Comprehension: 5-Understands complex 90% of the time/Cues < 10% of the time Expression Expression Mode: Verbal Expression: 5-Expresses basic 90% of the time/requires cueing < 10% of the time. Social Interaction Social Interaction: 5-Interacts appropriately 90% of the time - Needs monitoring or encouragement for participation or interaction. Problem Solving Problem Solving: 5-Solves basic 90% of the time/requires cueing < 10% of the time Memory Memory: 4-Recognizes or recalls 75 - 89% of the time/requires cueing 10 - 24% of the time FIM - Eating Eating Activity: 5: Set-up assist for cut food  Pain Pain Assessment Pain Assessment: Yes Pain Score: 5  Pain Location: Head Pain Orientation: Right;Posterior Pain Descriptors / Indicators: Headache Pain Intervention(s): RN made aware  Therapy/Group: Individual Therapy  Carmelia Roller., [place] [phone]  [place] 12/20/2013, 12:22 PM

## 2013-12-20 NOTE — Patient Care Conference (Signed)
Inpatient RehabilitationTeam Conference and Plan of Care Update Date: 12/19/2013   Time: 2:50 PM    Patient Name: Claudia Perez      Medical Record Number: [id number]  Date of Birth: 12-06-1950 Sex: Female         Room/Bed: 4M07C/4M07C-01 Payor Info: Payor: MEDICARE / Plan: MEDICARE PART A AND B / Product Type: *No Product type* /    Admitting Diagnosis: TBI  Admit Date/Time:  12/14/2013  6:20 PM Admission Comments: No comment available   Primary Diagnosis:  <principal problem not specified> Principal Problem: <principal problem not specified>  Patient Active Problem List   Diagnosis Date Noted  . TBI (traumatic brain injury) 12/14/2013  . Fall down stairs 12/11/2013  . Basilar skull fracture 12/11/2013  . Traumatic intracranial hemorrhage 12/11/2013  . Back pain 12/11/2013  . Subarachnoid hemorrhage following injury 12/09/2013  . Syncope 03/27/2013  . Clavicular fracture 03/27/2013  . COUGH 12/28/2007    Expected Discharge Date: Expected Discharge Date: 12/26/13  Team Members Present: Physician leading conference: Dr. Alger Simons Social Worker Present: Lennart Pall, LCSW Nurse Present: Other (comment) Salena Saner, RN) PT Present: Melene Plan, PT OT Present: Roanna Epley, [place], OT;Karen [place], OT SLP Present: Weston Anna, SLP PPS Coordinator present : Daiva Nakayama, RN, CRRN     Current Status/Progress Goal Weekly Team Focus  Medical   tbi with skull fx after fall, balance issues, pain  improving pain control and balance to improve functional mobility  improve pain control, headaches   Bowel/Bladder   continent of bowel and bladder  remains continent of bowel and bladder w/ regular BMs  assess bowel and bladder function daily,Q shift   Swallow/Nutrition/ Hydration             ADL's   min A overall, decreased activity tolerance  supervision overall  activity tolerance, transfers, safety awareness   Mobility   Min-Mod A  Mod(I)-Supervision  standing  balance, functional endurance, safety, trial ADs   Communication   Supervision  Mod I  diaphragmatic breathing, speech intelligibility    Safety/Cognition/ Behavioral Observations  Min A  Supervision  working memory, functional problem solving    Pain   c/o head ache w/ relief from robaxin prn  pain relief  assess for pain and for relief    Skin   intact  remins intact       Rehab Goals Patient on target to meet rehab goals: Yes *See Care Plan and progress notes for long and short-term goals.  Barriers to Discharge: premorbid deficits, pain    Possible Resolutions to Barriers:  NMR, pain control, patient education    Discharge Planning/Teaching Needs:  home with family to provide intermittent assistance      Team Discussion:  Ataxic somewhat improved.  Making good progress overall.  Will definitely need to use walker at home for safety.  Hoping to reach mod i w/c level.  Uncertain how close to baseline pt is with cognition.  Little insight from husband.  Revisions to Treatment Plan:  None   Continued Need for Acute Rehabilitation Level of Care: The patient requires daily medical management by a physician with specialized training in physical medicine and rehabilitation for the following conditions: Daily direction of a multidisciplinary physical rehabilitation program to ensure safe treatment while eliciting the highest outcome that is of practical value to the patient.: Yes Daily analysis of laboratory values and/or radiology reports with any subsequent need for medication adjustment of medical intervention for : Neurological problems;Other  Claudia Perez 12/20/2013, 2:44 PM

## 2013-12-20 NOTE — Progress Notes (Signed)
Subjective/Complaints: Reasonable night. No new issues. Denies dizziness or current headache A 12 point review of systems has been performed and if not noted above is otherwise negative.   Objective: Vital Signs: Blood pressure 116/74, pulse 78, temperature 98.3 F (36.8 C), temperature source Oral, resp. rate 16, height 5\' 1"  (1.549 m), weight 51.3 kg (113 lb 1.5 oz), SpO2 96.00%. No results found. No results found for this basename: WBC, HGB, HCT, PLT,  in the last 72 hours  Recent Labs  12/18/13 0935  NA 131*  K 4.1  CL 92*  GLUCOSE 146*  BUN 11  CREATININE 0.70  CALCIUM 9.3   CBG (last 3)  No results found for this basename: GLUCAP,  in the last 72 hours  Wt Readings from Last 3 Encounters:  12/14/13 51.3 kg (113 lb 1.5 oz)  12/10/13 49.9 kg (110 lb 0.2 oz)  03/27/13 54.069 kg (119 lb 3.2 oz)    Physical Exam:  Constitutional: She is oriented to person, place, and time. She appears well-developed and well-nourished. No distress HENT:  Head: Normocephalic. No tenderness over frontal sinuses Eyes: Conjunctivae are normal. Pupils are equal, round, and reactive to light.  Neck: Normal range of motion.  Cardiovascular: Normal rate and regular rhythm.  Respiratory: Effort normal and breath sounds normal. No respiratory distress. She has no wheezes.  GI: Soft. Bowel sounds are normal. She exhibits no distension. There is no tenderness.  Musculoskeletal: She exhibits no edema. Right hip/troch moderately tender to palpation and ROM Neurological: She is alert and oriented to person, place, and time.  Still with halting speech/accent. Follows basic commands without difficulty. Good recall with fair insight and awareness. Dysmetria LUE>RUE. Decrease heel to shin BLE as well. Decreased fine motor bilateral upper but somewhat improving. Strength grossly  4-/5 with more weakness in the left upper.   Psychiatric: She has a normal mood and affect. Her behavior is normal      Assessment/Plan: 1. 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 Physiatrist is providing close team supervision and 24 hour management of active medical problems listed below. Physiatrist and rehab team continue to assess barriers to discharge/monitor patient progress toward functional and medical goals.   FIM: FIM - Bathing Bathing Steps Patient Completed: Chest;Right Arm;Left Arm;Front perineal area;Buttocks;Right upper leg;Left upper leg;Right lower leg (including foot);Left lower leg (including foot);Abdomen Bathing: 5: Supervision: Safety issues/verbal cues  FIM - Upper Body Dressing/Undressing Upper body dressing/undressing steps patient completed: Thread/unthread right sleeve of pullover shirt/dresss;Thread/unthread left sleeve of pullover shirt/dress;Put head through opening of pull over shirt/dress;Pull shirt over trunk Upper body dressing/undressing: 5: Supervision: Safety issues/verbal cues FIM - Lower Body Dressing/Undressing Lower body dressing/undressing steps patient completed: Thread/unthread left pants leg;Thread/unthread right pants leg;Pull pants up/down;Don/Doff right sock;Don/Doff left sock Lower body dressing/undressing: 5: Supervision: Safety issues/verbal cues  FIM - Toileting Toileting steps completed by patient: Adjust clothing prior to toileting;Performs perineal hygiene;Adjust clothing after toileting Toileting Assistive Devices: Grab bar or rail for support Toileting: 4: Steadying assist  FIM - Radio producer Devices: Grab bars Toilet Transfers: 4-To toilet/BSC: Min A (steadying Pt. > 75%);4-From toilet/BSC: Min A (steadying Pt. > 75%)  FIM - Bed/Chair Transfer Bed/Chair Transfer Assistive Devices: Arm rests Bed/Chair Transfer: 3: Sit > Supine: Mod A (lifting assist/Pt. 50-74%/lift 2 legs);4: Chair  or W/C > Bed: Min A (steadying Pt. > 75%);4: Bed > Chair or  W/C: Min A (steadying Pt. > 75%)  FIM - Locomotion: Wheelchair Distance: 40' Locomotion: Wheelchair: 1: Total Assistance/staff pushes wheelchair (Pt<25%) FIM - Locomotion: Ambulation Locomotion: Ambulation Assistive Devices: Walker - Rolling;Other (comment) (HHA) Ambulation/Gait Assistance: 3: Mod assist;4: Min assist Locomotion: Ambulation: 2: Travels 50 - 149 ft with moderate assistance (Pt: 50 - 74%)  Comprehension Comprehension Mode: Auditory Comprehension: 5-Understands complex 90% of the time/Cues < 10% of the time  Expression Expression Mode: Verbal Expression: 5-Expresses basic 90% of the time/requires cueing < 10% of the time.  Social Interaction Social Interaction: 5-Interacts appropriately 90% of the time - Needs monitoring or encouragement for participation or interaction.  Problem Solving Problem Solving: 5-Solves basic 90% of the time/requires cueing < 10% of the time  Memory Memory: 4-Recognizes or recalls 75 - 89% of the time/requires cueing 10 - 24% of the time  Medical Problem List and Plan:  1. 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 DVT Prophylaxis/Anticoagulation: SCDs. Monitor for any signs of DVT  3. Pain Management: Ultram and Robaxin as needed. Monitor with increased mobility   -added topamax for prophylaxis, 25mg  which has helped  -ice to right hip, stretching--tolerable at this point 4. Neuropsych: This patient is capable of making decisions on her own behalf.  5. Hyponatremia:continue mild fluid restriction  -sodium slowly recovering  -recheck Thursday  LOS (Days) 6 A FACE TO FACE EVALUATION WAS PERFORMED  SWARTZ,ZACHARY T 12/20/2013 8:15 AM

## 2013-12-20 NOTE — Progress Notes (Signed)
Physical Therapy Session Note  Patient Details  Name: Claudia Perez MRN: [id number] Date of Birth: 11/08/1951  Today's Date: 12/20/2013 Time: Treatment Session 1: [id number]; Treatment Session 2: 1400-1500 Time Calculation (min): Treatment Session 1: 60 min; Treatment Session 2: 57min  Short Term Goals: Week 1:  PT Short Term Goal 1 (Week 1): Pt to perform bed mobility w/ supervision PT Short Term Goal 2 (Week 1): Pt to perform transfers w/ min A PT Short Term Goal 3 (Week 1): Pt to amb 100' w/ LRAD and min A PT Short Term Goal 4 (Week 1): Pt to negotiate up/down 10 steps w/ single rail and min A  Skilled Therapeutic Interventions/Progress Updates:  Treatment Session 1:  1:1. Pt received sitting in w/c, ready for therapy. Focus this session on dynamic standing balance through obstacle neogotiation and B UE challenges while standing on compliant surface w/ emphasis on maintaining anterior weight shift and coordination. Pt req min guard assist to mod HHA. Pt w/ good tolerance to amb w/ use of RW 75'x2 w/ min guard assist to close (S). No noted LOB w/ use of RW. Pt sitting in w/c at end of session w/ all needs in reach.   Treatment Session 2:  Pt participated in group therapy session w/ emphasis on B LE strength and standing balance. Pt w/ good tolerance to seated/standing therex, standing ball toss w/out UE support on hard level surface as well as NuStep, level 3x80min. Pt req min A to occasional mod A provided by rehab tech throughout session +/- RW. Pt transported back to room at end of session by rehab tech.   Therapy Documentation Precautions:  Precautions Precautions: Fall Precaution Comments: Spoke with Dr Luiz Ochoa who states no Vestibular testing for a week from today to allow more healing time prior to performing any quick head movements or dangling head off of bed.   Restrictions Weight Bearing Restrictions: No   Pain:    See FIM for current functional status  Therapy/Group:  Individual Therapy  Gilmore Laroche 12/20/2013, 5:06 PM

## 2013-12-21 ENCOUNTER — Inpatient Hospital Stay (HOSPITAL_COMMUNITY): Payer: Medicare Other | Admitting: Speech Pathology

## 2013-12-21 ENCOUNTER — Inpatient Hospital Stay (HOSPITAL_COMMUNITY): Payer: Medicare Other

## 2013-12-21 ENCOUNTER — Encounter (HOSPITAL_COMMUNITY): Payer: Medicare Other

## 2013-12-21 LAB — BASIC METABOLIC PANEL
BUN: 13 mg/dL (ref 6–23)
CO2: 22 mEq/L (ref 19–32)
Calcium: 9 mg/dL (ref 8.4–10.5)
Chloride: 102 mEq/L (ref 96–112)
Creatinine, Ser: 0.7 mg/dL (ref 0.50–1.10)
Glucose, Bld: 101 mg/dL — ABNORMAL HIGH (ref 70–99)
Potassium: 4.4 mEq/L (ref 3.7–5.3)
Sodium: 136 mEq/L — ABNORMAL LOW (ref 137–147)

## 2013-12-21 NOTE — Plan of Care (Signed)
Problem: RH BLADDER ELIMINATION Goal: RH STG MANAGE BLADDER WITH ASSISTANCE STG Manage Bladder With Assistance. Mod I  Outcome: Progressing No report of incontience

## 2013-12-21 NOTE — Plan of Care (Signed)
Problem: RH SAFETY Goal: RH STG ADHERE TO SAFETY PRECAUTIONS W/ASSISTANCE/DEVICE STG Adhere to Safety Precautions With Assistance/Device. Mod I No evidence of unsafe behavior

## 2013-12-21 NOTE — Progress Notes (Addendum)
Physical Therapy Session Note  Patient Details  Name: Claudia Perez MRN: [id number] Date of Birth: 1951-05-26  Today's Date: 12/21/2013 Time: Treatment Session 1: 1045-1140; Treatment Session 2: [id number]  Time Calculation (min): Treatment Session 1: 55 min; Treatment Session 2: 42min  Short Term Goals: Week 1:  PT Short Term Goal 1 (Week 1): Pt to perform bed mobility w/ supervision PT Short Term Goal 2 (Week 1): Pt to perform transfers w/ min A PT Short Term Goal 3 (Week 1): Pt to amb 100' w/ LRAD and min A PT Short Term Goal 4 (Week 1): Pt to negotiate up/down 10 steps w/ single rail and min A  Skilled Therapeutic Interventions/Progress Updates:  Treatment Session 1:  1:1. Pt received sitting in w/c, ready for therapy. Focus this session on NMR and gait training. Dynamic sitting balance challenged by pt sitting on red air disc performing ball toss as well as weight ball passing in functional planes w/ overall (S). Dynamic standing balance challenged by reaching in functional planes w/ B UE +/- complaint surface w/ encouraged anterior weight shift, req min guard to min A. W/c propulsion w/ B LE for emphasis on dissociation of trunk/ B LE extension pattern. Pt able to amb 175' this session w/ RW, req (S) w/ exception of 1 LOB 2/2 posterior lean w/ mod A for correction. Pt sitting in w/c at end of session w/ all needs in reach.   Treatment Session 2:  1:1. Pt received sitting in w/c, ready for therapy. Pt able to amb 150' and 100' this session w/ RW on hard level and carpeted home areas w/ overall close (S). Pt able to negotiate up/down 12 steps w/ B rail and min A, however, mod A for correction of 1 posterior LOB. Pt cued to keep B UE in front to maintain anterior weight shift. Pt able to demonstrate t/f sup<>sit in a standard bed w/ (S) and min A for car t/f. Use of NuStep at end of session, level 3x29min, to target strength/endurance. Pt sitting in w/c at end of session, all needs in reach.    Therapy Documentation Precautions:  Precautions Precautions: Fall Precaution Comments: Spoke with Dr Luiz Ochoa who states no Vestibular testing for a week from today to allow more healing time prior to performing any quick head movements or dangling head off of bed.   Restrictions Weight Bearing Restrictions: No  See FIM for current functional status  Therapy/Group: Individual Therapy  Gilmore Laroche 12/21/2013, 11:43 AM

## 2013-12-21 NOTE — Plan of Care (Signed)
Problem: RH SKIN INTEGRITY Goal: RH STG SKIN FREE OF INFECTION/BREAKDOWN No new skin breakdown/infection.  Outcome: Progressing No evidence of skin breakdown

## 2013-12-21 NOTE — Progress Notes (Signed)
Speech Language Pathology Daily Session Note  Patient Details  Name: Claudia Perez MRN: [id number] Date of Birth: 09-17-1951  Today's Date: 12/21/2013 Time: 1000-1030 Time Calculation (min): 30 min  Short Term Goals: Week 1: SLP Short Term Goal 1 (Week 1): Pt will utilize external memory aids to recall new, daily information with supervision question cues.  SLP Short Term Goal 2 (Week 1): Pt will demonstrate functional problem solving for basic and familiar tasks with supervision verbal cues.  SLP Short Term Goal 3 (Week 1): Pt will utilize speech intelligibility strategies at the conversation level with Mod I.   Skilled Therapeutic Interventions: Skilled treatment session focus on cognitive goals. SLP facilitated session by providing supervision question cues for recall of current medications and their functions. Pt also required supervision question cues for complex problem solving medication management task with organizing a pill box to increase overall safety. Pt's husband educated on need to purchase a daytime/nighttime pill box for medication management, he verbalized understanding. Continue plan of care.    FIM:  Comprehension Comprehension Mode: Auditory Comprehension: 5-Understands complex 90% of the time/Cues < 10% of the time Expression Expression Mode: Verbal Expression: 5-Expresses basic 90% of the time/requires cueing < 10% of the time. Social Interaction Social Interaction: 5-Interacts appropriately 90% of the time - Needs monitoring or encouragement for participation or interaction. Problem Solving Problem Solving: 5-Solves basic 90% of the time/requires cueing < 10% of the time Memory Memory: 4-Recognizes or recalls 75 - 89% of the time/requires cueing 10 - 24% of the time  Pain Pain Assessment Pain Assessment: No/denies pain  Therapy/Group: Individual Therapy  Apryll Hinkle 12/21/2013, 12:49 PM

## 2013-12-21 NOTE — Plan of Care (Signed)
Problem: RH PAIN MANAGEMENT Goal: RH STG PAIN MANAGED AT OR BELOW PT'S PAIN GOAL Pain level 3 or less on a scale of 0-10.  Outcome: Progressing No c/o pain     

## 2013-12-21 NOTE — Progress Notes (Signed)
Subjective/Complaints:  had a good night. In good spirits. Denies pain. Pleased with progress. A 12 point review of systems has been performed and if not noted above is otherwise negative.   Objective: Vital Signs: Blood pressure 111/74, pulse 81, temperature 98.9 F (37.2 C), temperature source Oral, resp. rate 18, height 5\' 1"  (1.549 m), weight 51.3 kg (113 lb 1.5 oz), SpO2 94.00%. No results found. No results found for this basename: WBC, HGB, HCT, PLT,  in the last 72 hours  Recent Labs  12/18/13 0935 12/21/13 0610  NA 131* 136*  K 4.1 4.4  CL 92* 102  GLUCOSE 146* 101*  BUN 11 13  CREATININE 0.70 0.70  CALCIUM 9.3 9.0   CBG (last 3)  No results found for this basename: GLUCAP,  in the last 72 hours  Wt Readings from Last 3 Encounters:  12/14/13 51.3 kg (113 lb 1.5 oz)  12/10/13 49.9 kg (110 lb 0.2 oz)  03/27/13 54.069 kg (119 lb 3.2 oz)    Physical Exam:  Constitutional: She is oriented to person, place, and time. She appears well-developed and well-nourished. No distress HENT:  Head: Normocephalic. No tenderness over frontal sinuses Eyes: Conjunctivae are normal. Pupils are equal, round, and reactive to light.  Neck: Normal range of motion.  Cardiovascular: Normal rate and regular rhythm.  Respiratory: Effort normal and breath sounds normal. No respiratory distress. She has no wheezes.  GI: Soft. Bowel sounds are normal. She exhibits no distension. There is no tenderness.  Musculoskeletal: She exhibits no edema. Right hip/troch moderately tender to palpation and ROM Neurological: She is alert and oriented to person, place, and time.  Still with halting speech/accent--clearer. Follows basic commands without difficulty. Good recall with fair insight and awareness. Dysmetria better. No nystagmus. Decreased fine motor bilateral upper but somewhat improving. Strength grossly  4-/5 with more weakness in the left upper.   Psychiatric: She has a normal mood and affect.  Her behavior is normal     Assessment/Plan: 1. 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 Physiatrist is providing close team supervision and 24 hour management of active medical problems listed below. Physiatrist and rehab team continue to assess barriers to discharge/monitor patient progress toward functional and medical goals.   FIM: FIM - Bathing Bathing Steps Patient Completed: Chest;Right Arm;Left Arm;Abdomen;Front perineal area;Left lower leg (including foot);Right lower leg (including foot);Left upper leg;Right upper leg;Buttocks Bathing: 5: Supervision: Safety issues/verbal cues  FIM - Upper Body Dressing/Undressing Upper body dressing/undressing steps patient completed: Thread/unthread right sleeve of pullover shirt/dresss;Thread/unthread left sleeve of pullover shirt/dress;Put head through opening of pull over shirt/dress;Pull shirt over trunk Upper body dressing/undressing: 5: Set-up assist to: Obtain clothing/put away FIM - Lower Body Dressing/Undressing Lower body dressing/undressing steps patient completed: Pull pants up/down;Thread/unthread left pants leg;Thread/unthread right pants leg;Don/Doff right sock;Don/Doff left sock Lower body dressing/undressing: 4: Min-Patient completed 75 plus % of tasks  FIM - Toileting Toileting steps completed by patient: Adjust clothing prior to toileting;Performs perineal hygiene;Adjust clothing after toileting Toileting Assistive Devices: Grab bar or rail for support Toileting: 4: Steadying assist  FIM - Radio producer Devices: Grab bars Toilet Transfers: 4-To toilet/BSC: Min A (steadying Pt. > 75%);4-From toilet/BSC: Min A (steadying Pt. > 75%)  FIM - Bed/Chair Transfer Bed/Chair Transfer Assistive Devices: Arm rests;Walker Bed/Chair Transfer: 4: Chair or W/C > Bed: Min A  (steadying Pt. > 75%);4: Bed >  Chair or W/C: Min A (steadying Pt. > 75%)  FIM - Locomotion: Wheelchair Distance: 40' Locomotion: Wheelchair: 3: Travels 150 ft or more: maneuvers on rugs and over door sills with moderate assistance  (Pt: 50 - 74%) FIM - Locomotion: Ambulation Locomotion: Ambulation Assistive Devices: Walker - Rolling;Other (comment) (HHA) Ambulation/Gait Assistance: 3: Mod assist;4: Min assist Locomotion: Ambulation: 2: Travels 50 - 149 ft with moderate assistance (Pt: 50 - 74%)  Comprehension Comprehension Mode: Auditory Comprehension: 5-Understands complex 90% of the time/Cues < 10% of the time  Expression Expression Mode: Verbal Expression: 5-Expresses basic 90% of the time/requires cueing < 10% of the time.  Social Interaction Social Interaction: 5-Interacts appropriately 90% of the time - Needs monitoring or encouragement for participation or interaction.  Problem Solving Problem Solving: 5-Solves basic 90% of the time/requires cueing < 10% of the time  Memory Memory: 4-Recognizes or recalls 75 - 89% of the time/requires cueing 10 - 24% of the time  Medical Problem List and Plan:  1. 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 DVT Prophylaxis/Anticoagulation: SCDs. Monitor for any signs of DVT  3. Pain Management: Ultram and Robaxin as needed. Monitor with increased mobility   -continue topamax for prophylaxis, 25mg  which has helped  -ice to right hip, stretching--tolerable at this point 4. Neuropsych: This patient is capable of making decisions on her own behalf.  5. Hyponatremia:dc mild fluid restriction  -sodium 136  LOS (Days) 7 A FACE TO FACE EVALUATION WAS PERFORMED  SWARTZ,ZACHARY T 12/21/2013 8:02 AM

## 2013-12-21 NOTE — Plan of Care (Signed)
Problem: RH BOWEL ELIMINATION Goal: RH STG MANAGE BOWEL WITH ASSISTANCE STG Manage Bowel with Assistance. Mod I Outcome: Progressing No report of incontinent

## 2013-12-21 NOTE — Progress Notes (Addendum)
Occupational Therapy Session Note  Patient Details  Name: Claudia Perez MRN: [id number] Date of Birth: 1950-12-08  Today's Date: 12/21/2013 Time: 0900-1000 Time Calculation (min): 60 min  Short Term Goals: Week 1:  OT Short Term Goal 1 (Week 1): Pt will transfer to toilet with steady A  OT Short Term Goal 2 (Week 1): Pt will dress LB with min A  OT Short Term Goal 3 (Week 1): Pt will demonstrate dynamic standing balance with min A during functional tasks without UE support  OT Short Term Goal 4 (Week 1): Pt will ambulate with LRAD to obtain clothing for dressing with min  A for balance  Skilled Therapeutic Interventions/Progress Updates:     Pt was seated in w/c upon arrival, and husband was present.  Pt ambulated with RW to walk in shower with min A.  Pt sat on shower chair and utilized grab bars to complete UB/LB bathing.  Pt demonstrates ataxic movement and requires extra time to complete tasks.  Pt completed UB/LB dressing while seated and then ambulated with RW back to wc.  Pt c/o pain at a level 5, but when asked if therapy should notify nursing pt denied request. Discussed pt home enviroment with pt and husband and made few recommendations for bathing at home, i.e. no tub baths at first.  Therapist will f/u with pt regarding safe tranfers/showering techniques to be carried out at home.  Focus: Increasing independence in ADL performance, increasing safety awareness, increasing endurance and activity tolerance.   Therapy Documentation Precautions:  Precautions Precautions: Fall Precaution Comments: Spoke with Dr Luiz Ochoa who states no Vestibular testing for a week from today to allow more healing time prior to performing any quick head movements or dangling head off of bed.   Restrictions Weight Bearing Restrictions: No  See FIM for current functional status  Therapy/Group: Individual Therapy  Shipley,Sheila 12/21/2013, 10:27 AM  Note reviewed and accurately reflects treatment  session.

## 2013-12-22 ENCOUNTER — Inpatient Hospital Stay (HOSPITAL_COMMUNITY): Payer: Medicare Other

## 2013-12-22 ENCOUNTER — Encounter (HOSPITAL_COMMUNITY): Payer: Medicare Other

## 2013-12-22 ENCOUNTER — Inpatient Hospital Stay (HOSPITAL_COMMUNITY): Payer: Medicare Other | Admitting: Speech Pathology

## 2013-12-22 NOTE — Progress Notes (Signed)
Occupational Therapy Weekly Progress Note  Patient Details  Name: Claudia Perez MRN: [id number] Date of Birth: [birth year]  Today's Date: 12/22/2013  Patient has met 4 of 4 short term goals.  Pt has demonstrated steady progress in gaining independence in bath/dress activities, pts functional ambulation techniques have increased.  Pt still requires min verbal cues for safety awareness in transfers and when utilizing RW to ambulate.  Pt still requires close supervision to min assist intermittent due to occasional LOB and safety awareness. Family education scheduled for Monday and home recommendation will be made prior to d/c.  Pt continues to demonstrate ataxic movements and therefore more time is required to complete functional tasks.   Pt still requires min to mod cuing for use of RW in functional mobility.  Patient continues to demonstrate the following deficits: muscle weakness, impaired timing and sequencing, unbalanced muscle activation, ataxia and decreased motor planning, decreased safety awareness and delayed processing and decreased balance strategies and difficulty maintaining precautions and therefore will continue to benefit from skilled OT intervention to enhance overall performance with BADL and Reduce care partner burden.  Patient progressing toward long term goals..  Continue plan of care.  OT Short Term Goals Week 1:  OT Short Term Goal 1 (Week 1): Pt will transfer to toilet with steady A  OT Short Term Goal 1 - Progress (Week 1): Met OT Short Term Goal 2 (Week 1): Pt will dress LB with min A  OT Short Term Goal 2 - Progress (Week 1): Met OT Short Term Goal 3 (Week 1): Pt will demonstrate dynamic standing balance with min A during functional tasks without UE support  OT Short Term Goal 3 - Progress (Week 1): Met OT Short Term Goal 4 (Week 1): Pt will ambulate with LRAD to obtain clothing for dressing with min  A for balance OT Short Term Goal 4 - Progress (Week 1): Met Week 2:  OT  Short Term Goal 1 (Week 2): STG=LTG secondary to LOS     Therapy Documentation Precautions:  Precautions Precautions: Fall Precaution Comments: Spoke with Dr Luiz Ochoa who states no Vestibular testing for a week from today to allow more healing time prior to performing any quick head movements or dangling head off of bed.   Restrictions Weight Bearing Restrictions: No  See FIM for current functional status  Therapy/Group: Individual Therapy  Shipley,Sheila 12/22/2013, 11:23 AM

## 2013-12-22 NOTE — Progress Notes (Signed)
Occupational Therapy Session Note  Patient Details  Name: Claudia Perez MRN: [id number] Date of Birth: 15-Sep-1951  Today's Date: 12/22/2013 Time: 0700-0800 Time Calculation (min): 60 min  Short Term Goals: Week 1:  OT Short Term Goal 1 (Week 1): Pt will transfer to toilet with steady A  OT Short Term Goal 2 (Week 1): Pt will dress LB with min A  OT Short Term Goal 3 (Week 1): Pt will demonstrate dynamic standing balance with min A during functional tasks without UE support  OT Short Term Goal 4 (Week 1): Pt will ambulate with LRAD to obtain clothing for dressing with min  A for balance  Skilled Therapeutic Interventions/Progress Updates:    Pt was asleep in bed upon arrival.  Pt agreed to get up and transferred from supine to sit with mod A and from sitting to standing with min A.  PT utilized RW to functionally ambulate to toilet and performed toileting routine. Pt functionally ambulated with RW to gather items for shower utilizing walker bag.  Pt transferred to shower chair utilizing RW with min A.  Pt bath/dress UB/LB whiles seated on shower chair with min verbal cues and utilized grab bars for assistance.  Pt ambulated with RW out of shower to sink and stood at sink to complete oral care. Pt attempted to disregard RW and sit in wc, therapist educated on the importance/safety of utilizing RW through the transfer process from standing to sitting in wc.  Pt had no c/o pain, but does demonstrate ataxic movements and therefore requires additional time to complete tasks.  Focus: increasing independence in ADL performance, increasing dynamic standing balance, functional ambulation with RW, and increasing activity tolerance.     Therapy Documentation Precautions:  Precautions Precautions: Fall Precaution Comments: Spoke with Dr Luiz Ochoa who states no Vestibular testing for a week from today to allow more healing time prior to performing any quick head movements or dangling head off of bed.    Restrictions Weight Bearing Restrictions: No  See FIM for current functional status  Therapy/Group: Individual Therapy  Shipley,Sheila 12/22/2013, 9:09 AM  Note reviewed and accurately reflects treatment session.

## 2013-12-22 NOTE — Progress Notes (Signed)
Physical Therapy Weekly Progress Note  Patient Details  Name: Claudia Perez MRN: [id number] Date of Birth: 12-06-1950  Today's Date: 12/22/2013 Time: Treatment Session 1: 8937-3428; Treatment Session 2: 1130-1200 Time Calculation (min): Treatment Session 1: 55 min; Treatment Session 2: 87mn  Patient has met 4 of 4 short term goals.  Pt continues to be very motivated to participate in therapy and has recently achieved 4/4 STGs w/ continued progress towards respective LTGs. Pt currently able to perform overall mobility w/ (S)-min A due to occasional posterior LOB during amb and stair negotiation as well as intermittent cueing for overall safety. Pt benefits from use of RW to promote anterior weight shift during amb, maximize level of independence and decrease risk for fall. Due to continued balance impairments strong recommendation for 24hr (S). LTGs regarding w/c propulsion downgraded due to decreased motor control in B UE/LE w/ pt therefore unable to perform at mod(I) level.   Patient continues to demonstrate the following deficits: decreased functional endurance, decreased standing balance, decreased strength, decreased trunk control, decreased coordination and therefore will continue to benefit from skilled PT intervention to enhance overall performance with the identified impairments  Patient progressing toward long term goals.  Continue plan of care.  PT Short Term Goals Week 1:  PT Short Term Goal 1 (Week 1): Pt to perform bed mobility w/ supervision PT Short Term Goal 1 - Progress (Week 1): Met PT Short Term Goal 2 (Week 1): Pt to perform transfers w/ min A PT Short Term Goal 2 - Progress (Week 1): Met PT Short Term Goal 3 (Week 1): Pt to amb 100' w/ LRAD and min A PT Short Term Goal 3 - Progress (Week 1): Met PT Short Term Goal 4 (Week 1): Pt to negotiate up/down 10 steps w/ single rail and min A Week 2:  PT Short Term Goal 1 (Week 2): STGs=LTGs due to anticipated LOS  Skilled  Therapeutic Interventions/Progress Updates:  Treatment Session 1:  1:1. Pt received sitting in w/c, ready for therapy. Focus this session on gait training, safety in home environment as well as problem solving management of items in therapy kitchen. Pt able to amb 150', 75'x2 and 175' this session w/ RW and overall close (S). Negotiate up/down 16 steps w/ B rail step-to pattern w/ good tolerance and min cueing for hand positioning to prevent posterior LOB. Attempted floor t/f training, but pt unable to lower self to ground 2/2 pain in B hips. Therapist demonstrating safe technique instead. Education in therapy kitchen on management of food/drinks in sealed containers to transport from counter<>table w/ use of bag on RW. Dynamic standing balance challenged at end of session w/ ball toss activity +/- compliant surface w/out B UE support, req close (S) to mod A overall. Pt sitting in w/c at end of session w/ all needs in reach.   Treatment Session 2:  1:1. Pt received sitting in w/c, ready for therapy. Focus this session on functional endurance and dynamic standing balance. Pt able to amb 200'x2 w/ overall (S) and steady pace. Pt req close(S)-min guard assist during ring toss activity on hard surface w/ t/f sit<>stand between each toss for overall emphasis on B LE strength anterior weight shift. Pt sitting in w/c at end of session w/ all needs in reach, husband in room.  Therapy Documentation Precautions:  Precautions Precautions: Fall Precaution Comments: Spoke with Dr HLuiz Ochoawho states no Vestibular testing for a week from today to allow more healing time prior to performing any  quick head movements or dangling head off of bed.   Restrictions Weight Bearing Restrictions: No  See FIM for current functional status  Therapy/Group: Individual Therapy  Gilmore Laroche 12/22/2013, 11:58 AM

## 2013-12-22 NOTE — Progress Notes (Signed)
Subjective/Complaints: No new problems. Up with therapy already this morning A 12 point review of systems has been performed and if not noted above is otherwise negative.   Objective: Vital Signs: Blood pressure 115/54, pulse 81, temperature 97.8 F (36.6 C), temperature source Oral, resp. rate 18, height 5\' 1"  (1.549 m), weight 52.073 kg (114 lb 12.8 oz), SpO2 99.00%. No results found. No results found for this basename: WBC, HGB, HCT, PLT,  in the last 72 hours  Recent Labs  12/21/13 0610  NA 136*  K 4.4  CL 102  GLUCOSE 101*  BUN 13  CREATININE 0.70  CALCIUM 9.0   CBG (last 3)  No results found for this basename: GLUCAP,  in the last 72 hours  Wt Readings from Last 3 Encounters:  12/21/13 52.073 kg (114 lb 12.8 oz)  12/10/13 49.9 kg (110 lb 0.2 oz)  03/27/13 54.069 kg (119 lb 3.2 oz)    Physical Exam:  Constitutional: She is oriented to person, place, and time. She appears well-developed and well-nourished. No distress HENT:  Head: Normocephalic. No tenderness over frontal sinuses Eyes: Conjunctivae are normal. Pupils are equal, round, and reactive to light.  Neck: Normal range of motion.  Cardiovascular: Normal rate and regular rhythm.  Respiratory: Effort normal and breath sounds normal. No respiratory distress. She has no wheezes.  GI: Soft. Bowel sounds are normal. She exhibits no distension. There is no tenderness.  Musculoskeletal: She exhibits no edema. Right hip/troch moderately tender to palpation and ROM Neurological: She is alert and oriented to person, place, and time.  Still with halting speech/accent--clearer. Follows basic commands without difficulty. Good recall with fair insight and awareness. Dysmetria better. No nystagmus. Decreased fine motor bilateral upper but somewhat improving. Strength grossly  4-/5 with more weakness in the left upper.   Psychiatric: She has a normal mood and affect. Her behavior is normal     Assessment/Plan: 1.  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 Physiatrist is providing close team supervision and 24 hour management of active medical problems listed below. Physiatrist and rehab team continue to assess barriers to discharge/monitor patient progress toward functional and medical goals.   FIM: FIM - Bathing Bathing Steps Patient Completed: Chest;Buttocks;Right upper leg;Right Arm;Left Arm;Left upper leg;Right lower leg (including foot);Abdomen;Front perineal area;Left lower leg (including foot) Bathing: 5: Supervision: Safety issues/verbal cues  FIM - Upper Body Dressing/Undressing Upper body dressing/undressing steps patient completed: Thread/unthread right sleeve of pullover shirt/dresss;Thread/unthread left sleeve of pullover shirt/dress;Put head through opening of pull over shirt/dress;Pull shirt over trunk Upper body dressing/undressing: 5: Set-up assist to: Obtain clothing/put away FIM - Lower Body Dressing/Undressing Lower body dressing/undressing steps patient completed: Pull pants up/down;Thread/unthread left pants leg;Thread/unthread right pants leg;Don/Doff right sock;Don/Doff left shoe Lower body dressing/undressing: 5: Set-up assist to: Obtain clothing  FIM - Toileting Toileting steps completed by patient: Adjust clothing prior to toileting;Performs perineal hygiene;Adjust clothing after toileting Toileting Assistive Devices: Grab bar or rail for support Toileting: 4: Steadying assist  FIM - Radio producer Devices: Grab bars Toilet Transfers: 4-To toilet/BSC: Min A (steadying Pt. > 75%);4-From toilet/BSC: Min A (steadying Pt. > 75%)  FIM - Bed/Chair Transfer Bed/Chair Transfer Assistive Devices: Arm rests;Walker Bed/Chair Transfer: 4: Chair or W/C > Bed: Min A (steadying Pt. > 75%);4: Bed > Chair or W/C: Min A (steadying  Pt. > 75%);5: Supine > Sit: Supervision (verbal cues/safety  issues);5: Sit > Supine: Supervision (verbal cues/safety issues)  FIM - Locomotion: Wheelchair Distance: 40' Locomotion: Wheelchair: 3: Travels 150 ft or more: maneuvers on rugs and over door sills with moderate assistance  (Pt: 50 - 74%) FIM - Locomotion: Ambulation Locomotion: Ambulation Assistive Devices: Administrator Ambulation/Gait Assistance: 3: Mod assist;4: Min assist Locomotion: Ambulation: 5: Travels 150 ft or more with supervision/safety issues  Comprehension Comprehension Mode: Auditory Comprehension: 5-Understands basic 90% of the time/requires cueing < 10% of the time  Expression Expression Mode: Verbal Expression: 5-Expresses basic 90% of the time/requires cueing < 10% of the time.  Social Interaction Social Interaction: 5-Interacts appropriately 90% of the time - Needs monitoring or encouragement for participation or interaction.  Problem Solving Problem Solving: 5-Solves basic 90% of the time/requires cueing < 10% of the time  Memory Memory: 4-Recognizes or recalls 75 - 89% of the time/requires cueing 10 - 24% of the time  Medical Problem List and Plan:  1. 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 DVT Prophylaxis/Anticoagulation: SCDs. Monitor for any signs of DVT  3. Pain Management: Ultram and Robaxin as needed. Monitor with increased mobility   -continue topamax for prophylaxis, 25mg  qhs  -ice to right hip, stretching--tolerable at this point 4. Neuropsych: This patient is capable of making decisions on her own behalf.  5. Hyponatremia:dc'ed mild fluid restriction  -sodium 136  LOS (Days) 8 A FACE TO FACE EVALUATION WAS PERFORMED  SWARTZ,ZACHARY T 12/22/2013 7:40 AM

## 2013-12-22 NOTE — Progress Notes (Signed)
Speech Language Pathology Weekly Progress & Session Notes  Patient Details  Name: Claudia Perez MRN: [id number] Date of Birth: May 21, 1951  Today's Date: 12/22/2013 Time: 0630-1601 Time Calculation (min): 40 min  Short Term Goals: Week 1: SLP Short Term Goal 1 (Week 1): Pt will utilize external memory aids to recall new, daily information with supervision question cues.  SLP Short Term Goal 1 - Progress (Week 1): Met SLP Short Term Goal 2 (Week 1): Pt will demonstrate functional problem solving for basic and familiar tasks with supervision verbal cues.  SLP Short Term Goal 2 - Progress (Week 1): Met SLP Short Term Goal 3 (Week 1): Pt will utilize speech intelligibility strategies at the conversation level with Mod I.  SLP Short Term Goal 3 - Progress (Week 1): Not met  New Short-Term Goals:  Week 2: SLP Short Term Goal 1 (Week 2): Pt and husband will verbalize/demonstrate understanding of memory compensatory strategies with Mod I.   Weekly Progress Updates: Pt has made functional gains and has met 2 of 3 STG's this reporting period. Currently, pt requires extra time and supervision verbal and question cues for functional problem solving, working memory with utilization of compensatory strategies and utilization of diaphragmatic breathing at the sentence level. Pt/family education going and pt would benefit from continued skilled SLP intervention to complete pt/family education to increase overall safety for d/c home.    SLP Intensity: Minumum of 1-2 x/day, 30 to 90 minutes SLP Frequency: 5 out of 7 days SLP Duration/Estimated Length of Stay: 1/27 SLP Treatment/Interventions: Cognitive remediation/compensation;Cueing hierarchy;Functional tasks;Environmental controls;Internal/external aids;Patient/family education;Therapeutic Activities  Daily Session Skilled Therapeutic Intervention: Treatment focus on cognitive goals. SLP facilitated session by providing supervision question cues for  utilization of schedule to anticipate next therapy session. Pt also requires extra time and supervision cues for functional problem solving with navigation of the Internet during a specific search. Pt  demonstrated appropriate anticipatory awareness for acitivties she will need assistance with at discharge.   FIM:  Comprehension Comprehension Mode: Auditory Comprehension: 5-Understands basic 90% of the time/requires cueing < 10% of the time Expression Expression Mode: Verbal Expression: 5-Expresses basic 90% of the time/requires cueing < 10% of the time. Social Interaction Social Interaction: 5-Interacts appropriately 90% of the time - Needs monitoring or encouragement for participation or interaction. Problem Solving Problem Solving: 5-Solves basic 90% of the time/requires cueing < 10% of the time Memory Memory: 5-Recognizes or recalls 90% of the time/requires cueing < 10% of the time Pain No/Denies Pain  Therapy/Group: Individual Therapy  Claudia Perez 12/22/2013, 9:42 AM

## 2013-12-22 NOTE — Progress Notes (Signed)
Occupational Therapy Session Note  Patient Details  Name: Eveny Anastas MRN: [id number] Date of Birth: [birth year]  Today's Date: 12/22/2013 Time: 5945-8592 Time Calculation (min): 45 min  Short Term Goals: Week 2:  OT Short Term Goal 1 (Week 2): STG=LTG secondary to LOS  Skilled Therapeutic Interventions/Progress Updates:    Pt was in wc upon arrival and ambulated with RW down to therapy apartment.  Therapist demonstrated transfers to and from shower over shower lip utilizing RW.  Pt demonstrated understanding and knowledge by performing shower transfer successfully and pt husband was present for demonstration to discuss home enviroment. Pt educated on use of long handled reacher.  Pt participated in bed mobility exercises, rolling side to side, sitting to supine and going from supine to sitting.  Pt functionally ambulated back to hospital room utilizing RW.  Focus on functional ambulation, increasing activity tolerance, family education, increasing safety awareness and endurance.    Therapy Documentation Precautions:  Precautions Precautions: Fall Precaution Comments: Spoke with Dr Luiz Ochoa who states no Vestibular testing for a week from today to allow more healing time prior to performing any quick head movements or dangling head off of bed.   Restrictions Weight Bearing Restrictions: No   See FIM for current functional status  Therapy/Group: Individual Therapy  Shipley,Sheila 12/22/2013, 2:34 PM  Note reviewed and accurately reflects treatment session.

## 2013-12-23 NOTE — Progress Notes (Signed)
Social Work Patient ID: Claudia Perez, female   DOB: 1951/10/14, 63 y.o.   MRN: [id number]  Met yesterday with pt and husband to discuss d/c plans and family education.  Husband requesting list of caregiver agencies and this was provided.  Discussed general private pay costs.  Also confirmed that he has made plans to complete family education on Mon 1/26 with planned d/c 1/27.  Dalyn Kjos, LCSW

## 2013-12-23 NOTE — Plan of Care (Signed)
Problem: RH PAIN MANAGEMENT Goal: RH STG PAIN MANAGED AT OR BELOW PT'S PAIN GOAL Pain level 3 or less on a scale of 0-10.  Outcome: Progressing No c/o pain     

## 2013-12-23 NOTE — Progress Notes (Signed)
Subjective/Complaints: Feeling well. Denies pain. Pleased with progress.  A 12 point review of systems has been performed and if not noted above is otherwise negative.   Objective: Vital Signs: Blood pressure 104/66, pulse 74, temperature 98.3 F (36.8 C), temperature source Oral, resp. rate 18, height 5\' 1"  (1.549 m), weight 52.073 kg (114 lb 12.8 oz), SpO2 98.00%. No results found. No results found for this basename: WBC, HGB, HCT, PLT,  in the last 72 hours  Recent Labs  12/21/13 0610  NA 136*  K 4.4  CL 102  GLUCOSE 101*  BUN 13  CREATININE 0.70  CALCIUM 9.0   CBG (last 3)  No results found for this basename: GLUCAP,  in the last 72 hours  Wt Readings from Last 3 Encounters:  12/21/13 52.073 kg (114 lb 12.8 oz)  12/10/13 49.9 kg (110 lb 0.2 oz)  03/27/13 54.069 kg (119 lb 3.2 oz)    Physical Exam:  Constitutional: She is oriented to person, place, and time. She appears well-developed and well-nourished. No distress HENT:  Head: Normocephalic. No tenderness over frontal sinuses Eyes: Conjunctivae are normal. Pupils are equal, round, and reactive to light.  Neck: Normal range of motion.  Cardiovascular: Normal rate and regular rhythm.  Respiratory: Effort normal and breath sounds normal. No respiratory distress. She has no wheezes.  GI: Soft. Bowel sounds are normal. She exhibits no distension. There is no tenderness.  Musculoskeletal: She exhibits no edema. Right hip/troch moderately tender to palpation and ROM Neurological: She is alert and oriented to person, place, and time.  Still with halting speech/accent--clearer. Follows basic commands without difficulty. Good recall with fair insight and awareness. Dysmetria better. No nystagmus. Decreased fine motor bilateral upper but somewhat improving. Strength grossly  4-/5 with more weakness in the left upper.   Psychiatric: She has a normal mood and affect. Her behavior is normal     Assessment/Plan: 1. Functional  deficits secondary to traumatic brain injury with skull fx which require 3+ hours per day of interdisciplinary therapy in a comprehensive inpatient rehab setting. Physiatrist is providing close team supervision and 24 hour management of active medical problems listed below. Physiatrist and rehab team continue to assess barriers to discharge/monitor patient progress toward functional and medical goals.   FIM: FIM - Bathing Bathing Steps Patient Completed: Chest;Right Arm;Left Arm;Abdomen;Front perineal area;Left lower leg (including foot);Right lower leg (including foot);Left upper leg;Right upper leg;Buttocks Bathing: 5: Supervision: Safety issues/verbal cues  FIM - Upper Body Dressing/Undressing Upper body dressing/undressing steps patient completed: Thread/unthread right sleeve of pullover shirt/dresss;Thread/unthread left sleeve of pullover shirt/dress;Put head through opening of pull over shirt/dress;Pull shirt over trunk Upper body dressing/undressing: 6: More than reasonable amount of time FIM - Lower Body Dressing/Undressing Lower body dressing/undressing steps patient completed: Thread/unthread right pants leg;Thread/unthread left pants leg;Pull pants up/down;Don/Doff right sock;Don/Doff left sock Lower body dressing/undressing: 5: Supervision: Safety issues/verbal cues  FIM - Toileting Toileting steps completed by patient: Adjust clothing prior to toileting;Performs perineal hygiene;Adjust clothing after toileting Toileting Assistive Devices: Grab bar or rail for support Toileting: 5: Supervision: Safety issues/verbal cues  FIM - Radio producer Devices: Grab bars;Walker Toilet Transfers: 4-From toilet/BSC: Min A (steadying Pt. > 75%);4-To toilet/BSC: Min A (steadying Pt. > 75%)  FIM - Bed/Chair Transfer Bed/Chair Transfer Assistive Devices: Copy: 5: Chair or W/C > Bed: Supervision (verbal cues/safety issues);5: Bed > Chair or W/C:  Supervision (verbal cues/safety issues)  FIM - Locomotion: Wheelchair Distance: 40' Locomotion: Wheelchair: 0: Activity  did not occur FIM - Locomotion: Ambulation Locomotion: Ambulation Assistive Devices: Walker - Rolling Ambulation/Gait Assistance: 3: Mod assist;4: Min assist Locomotion: Ambulation: 5: Travels 150 ft or more with supervision/safety issues  Comprehension Comprehension Mode: Auditory Comprehension: 5-Understands basic 90% of the time/requires cueing < 10% of the time  Expression Expression Mode: Verbal Expression: 5-Expresses basic 90% of the time/requires cueing < 10% of the time.  Social Interaction Social Interaction: 5-Interacts appropriately 90% of the time - Needs monitoring or encouragement for participation or interaction.  Problem Solving Problem Solving: 5-Solves basic 90% of the time/requires cueing < 10% of the time  Memory Memory: 5-Recognizes or recalls 90% of the time/requires cueing < 10% of the time  Medical Problem List and Plan:  1. 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 DVT Prophylaxis/Anticoagulation: SCDs. Monitor for any signs of DVT  3. Pain Management: Ultram and Robaxin as needed. Monitor with increased mobility   -continue topamax for prophylaxis, 25mg  qhs  -ice to right hip, stretching--tolerable at this point 4. Neuropsych: This patient is capable of making decisions on her own behalf.  5. Hyponatremia:dc'ed mild fluid restriction  -sodium 136  -recheck next week  LOS (Days) 9 A FACE TO FACE EVALUATION WAS PERFORMED  Ajit Errico T 12/23/2013 8:23 AM

## 2013-12-24 ENCOUNTER — Inpatient Hospital Stay (HOSPITAL_COMMUNITY): Payer: Medicare Other | Admitting: Physical Therapy

## 2013-12-24 MED ORDER — PANTOPRAZOLE SODIUM 40 MG PO TBEC
40.0000 mg | DELAYED_RELEASE_TABLET | Freq: Every day | ORAL | Status: DC
  Administered 2013-12-24 – 2013-12-28 (×5): 40 mg via ORAL
  Filled 2013-12-24 (×7): qty 1

## 2013-12-24 MED ORDER — ALUM & MAG HYDROXIDE-SIMETH 200-200-20 MG/5ML PO SUSP: 30.0000 mL | Freq: Four times a day (QID) | ORAL | Status: DC | PRN

## 2013-12-24 NOTE — Progress Notes (Signed)
Physical Therapy Session Note  Patient Details  Name: Claudia Perez MRN: [id number] Date of Birth: December 29, 1950  Today's Date: 12/24/2013 Time: 1024-1109 Time Calculation (min): 45 min  Short Term Goals: Week 1:  PT Short Term Goal 1 (Week 1): Pt to perform bed mobility w/ supervision PT Short Term Goal 1 - Progress (Week 1): Met PT Short Term Goal 2 (Week 1): Pt to perform transfers w/ min A PT Short Term Goal 2 - Progress (Week 1): Met PT Short Term Goal 3 (Week 1): Pt to amb 100' w/ LRAD and min A PT Short Term Goal 3 - Progress (Week 1): Met PT Short Term Goal 4 (Week 1): Pt to negotiate up/down 10 steps w/ single rail and min A  Skilled Therapeutic Interventions/Progress Updates:   Pt was seen bedside in the am. Pt ambulated 200 feet with rolling walker and S to min guard. Performed cone taps, 3 sets x 10 reps each. Pt ascended 11 stairs with R rail and min A, descended 11 stairs with R rail and min to mod A. Pt returned to room with rolling walker and S to min guard.   Therapy Documentation Precautions:  Precautions Precautions: Fall Precaution Comments: Spoke with Dr Luiz Ochoa who states no Vestibular testing for a week from today to allow more healing time prior to performing any quick head movements or dangling head off of bed.   Restrictions Weight Bearing Restrictions: No General:   Pain: No c/o pain.  Locomotion : Ambulation Ambulation/Gait Assistance: 4: Min guard;5: Supervision   See FIM for current functional status  Therapy/Group: Individual Therapy  Dub Amis 12/24/2013, 12:29 PM

## 2013-12-24 NOTE — Plan of Care (Signed)
Problem: RH BOWEL ELIMINATION Goal: RH STG MANAGE BOWEL WITH ASSISTANCE STG Manage Bowel with Assistance. Mod I  Outcome: Progressing LBM 1/23 currently refusing colace and miralax, will monitor

## 2013-12-24 NOTE — Progress Notes (Signed)
Subjective/Complaints: Overall doing well. Has chronic cough, likely from GERD, wanted advise regarding rx  A 12 point review of systems has been performed and if not noted above is otherwise negative.   Objective: Vital Signs: Blood pressure 124/67, pulse 74, temperature 98.3 F (36.8 C), temperature source Oral, resp. rate 18, height 5\' 1"  (1.549 m), weight 52.073 kg (114 lb 12.8 oz), SpO2 98.00%. No results found. No results found for this basename: WBC, HGB, HCT, PLT,  in the last 72 hours No results found for this basename: NA, K, CL, CO, GLUCOSE, BUN, CREATININE, CALCIUM,  in the last 72 hours CBG (last 3)  No results found for this basename: GLUCAP,  in the last 72 hours  Wt Readings from Last 3 Encounters:  12/21/13 52.073 kg (114 lb 12.8 oz)  12/10/13 49.9 kg (110 lb 0.2 oz)  03/27/13 54.069 kg (119 lb 3.2 oz)    Physical Exam:  Constitutional: She is oriented to person, place, and time. She appears well-developed and well-nourished. No distress HENT:  Head: Normocephalic. No tenderness over frontal sinuses Eyes: Conjunctivae are normal. Pupils are equal, round, and reactive to light.  Neck: Normal range of motion.  Cardiovascular: Normal rate and regular rhythm.  Respiratory: Effort normal and breath sounds normal. No respiratory distress. She has no wheezes.  GI: Soft. Bowel sounds are normal. She exhibits no distension. There is no tenderness.  Musculoskeletal: She exhibits no edema. Right hip/troch moderately tender to palpation and ROM Neurological: She is alert and oriented to person, place, and time.  Still with halting speech/accent--clearer. Follows basic commands without difficulty. Good recall with fair insight and awareness. Dysmetria better. No nystagmus. Decreased fine motor bilateral upper but somewhat improving. Strength grossly  4-/5 with more weakness in the left upper.   Psychiatric: She has a normal mood and affect. Her behavior is normal      Assessment/Plan: 1. 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 Physiatrist is providing close team supervision and 24 hour management of active medical problems listed below. Physiatrist and rehab team continue to assess barriers to discharge/monitor patient progress toward functional and medical goals.   FIM: FIM - Bathing Bathing Steps Patient Completed: Chest;Right Arm;Left Arm;Abdomen;Front perineal area;Left lower leg (including foot);Right lower leg (including foot);Left upper leg;Right upper leg;Buttocks Bathing: 5: Supervision: Safety issues/verbal cues  FIM - Upper Body Dressing/Undressing Upper body dressing/undressing steps patient completed: Thread/unthread right sleeve of pullover shirt/dresss;Thread/unthread left sleeve of pullover shirt/dress;Put head through opening of pull over shirt/dress;Pull shirt over trunk Upper body dressing/undressing: 6: More than reasonable amount of time FIM - Lower Body Dressing/Undressing Lower body dressing/undressing steps patient completed: Thread/unthread right pants leg;Thread/unthread left pants leg;Pull pants up/down;Don/Doff right sock;Don/Doff left sock Lower body dressing/undressing: 5: Supervision: Safety issues/verbal cues  FIM - Toileting Toileting steps completed by patient: Adjust clothing prior to toileting;Performs perineal hygiene;Adjust clothing after toileting Toileting Assistive Devices: Grab bar or rail for support Toileting: 5: Supervision: Safety issues/verbal cues  FIM - Radio producer Devices: Grab bars;Walker Toilet Transfers: 4-From toilet/BSC: Min A (steadying Pt. > 75%);4-To toilet/BSC: Min A (steadying Pt. > 75%)  FIM - Bed/Chair Transfer Bed/Chair Transfer Assistive Devices: Copy: 5: Chair or W/C > Bed: Supervision (verbal  cues/safety issues);5: Bed > Chair or W/C: Supervision (verbal cues/safety issues)  FIM - Locomotion: Wheelchair Distance: 40' Locomotion: Wheelchair: 0: Activity did not occur  FIM - Locomotion: Ambulation Locomotion: Ambulation Assistive Devices: Walker - Rolling Ambulation/Gait Assistance: 3: Mod assist;4: Min assist Locomotion: Ambulation: 5: Travels 150 ft or more with supervision/safety issues  Comprehension Comprehension Mode: Auditory Comprehension: 5-Understands basic 90% of the time/requires cueing < 10% of the time  Expression Expression Mode: Verbal Expression: 5-Expresses basic 90% of the time/requires cueing < 10% of the time.  Social Interaction Social Interaction: 5-Interacts appropriately 90% of the time - Needs monitoring or encouragement for participation or interaction.  Problem Solving Problem Solving: 5-Solves basic 90% of the time/requires cueing < 10% of the time  Memory Memory: 5-Recognizes or recalls 90% of the time/requires cueing < 10% of the time  Medical Problem List and Plan:  1. 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 DVT Prophylaxis/Anticoagulation: SCDs. Monitor for any signs of DVT  3. Pain Management: Ultram and Robaxin as needed. Monitor with increased mobility   -continue topamax for prophylaxis, 25mg  qhs  -ice to right hip, stretching--tolerable at this point 4. Neuropsych: This patient is capable of making decisions on her own behalf.  5. Hyponatremia:dc'ed mild fluid restriction  -sodium 136  -recheck Monday 6. GERD: protonix, maalox  LOS (Days) 10 A FACE TO FACE EVALUATION WAS PERFORMED  SWARTZ,ZACHARY T 12/24/2013 7:44 AM

## 2013-12-25 ENCOUNTER — Inpatient Hospital Stay (HOSPITAL_COMMUNITY): Payer: Medicare Other

## 2013-12-25 ENCOUNTER — Encounter (HOSPITAL_COMMUNITY): Payer: Medicare Other

## 2013-12-25 ENCOUNTER — Inpatient Hospital Stay (HOSPITAL_COMMUNITY): Payer: Medicare Other | Admitting: Speech Pathology

## 2013-12-25 DIAGNOSIS — S069X9A Unspecified intracranial injury with loss of consciousness of unspecified duration, initial encounter: Secondary | ICD-10-CM

## 2013-12-25 DIAGNOSIS — S069XAA Unspecified intracranial injury with loss of consciousness status unknown, initial encounter: Secondary | ICD-10-CM

## 2013-12-25 DIAGNOSIS — K219 Gastro-esophageal reflux disease without esophagitis: Secondary | ICD-10-CM

## 2013-12-25 LAB — BASIC METABOLIC PANEL
BUN: 13 mg/dL (ref 6–23)
CHLORIDE: 103 meq/L (ref 96–112)
CO2: 20 mEq/L (ref 19–32)
Calcium: 9.1 mg/dL (ref 8.4–10.5)
Creatinine, Ser: 0.76 mg/dL (ref 0.50–1.10)
GFR calc non Af Amer: 89 mL/min — ABNORMAL LOW (ref >90)
Glucose, Bld: 104 mg/dL — ABNORMAL HIGH (ref 70–99)
Potassium: 4.4 mEq/L (ref 3.7–5.3)
SODIUM: 139 meq/L (ref 137–147)

## 2013-12-25 NOTE — Progress Notes (Signed)
Speech Language Pathology Daily Session Note  Patient Details  Name: Claudia Perez MRN: [id number] Date of Birth: 06-23-1951  Today's Date: 12/25/2013 Time: 0830-0855 Time Calculation (min): 25 min  Short Term Goals: Week 2: SLP Short Term Goal 1 (Week 2): Pt and husband will verbalize/demonstrate understanding of memory compensatory strategies with Mod I.   Skilled Therapeutic Interventions: Skilled treatment session focus on pt/family education in regards to current cognitive function and strategies to utilize at home to increase working memory, safety awareness and problem solving. Also reinforced recommendation for 24 hour supervision at discharge.  Handouts were also given to reinforce information. Pt and her husband verbalized understanding of all information presented.    FIM:  Comprehension Comprehension Mode: Auditory Comprehension: 5-Understands basic 90% of the time/requires cueing < 10% of the time Expression Expression Mode: Verbal Expression: 5-Expresses basic 90% of the time/requires cueing < 10% of the time. Social Interaction Social Interaction: 5-Interacts appropriately 90% of the time - Needs monitoring or encouragement for participation or interaction. Problem Solving Problem Solving: 5-Solves basic 90% of the time/requires cueing < 10% of the time Memory Memory: 5-Recognizes or recalls 90% of the time/requires cueing < 10% of the time  Pain Pain Assessment Pain Assessment: No/denies pain  Therapy/Group: Individual Therapy  Martinez Boxx, [place] 12/25/2013, 9:10 AM

## 2013-12-25 NOTE — Progress Notes (Signed)
Subjective/Complaints: Reflux still present. Otherwise feeling well. A 12 point review of systems has been performed and if not noted above is otherwise negative.   Objective: Vital Signs: Blood pressure 99/66, pulse 76, temperature 98.4 F (36.9 C), temperature source Oral, resp. rate 17, height 5\' 1"  (1.549 m), weight 52.073 kg (114 lb 12.8 oz), SpO2 98.00%. No results found. No results found for this basename: WBC, HGB, HCT, PLT,  in the last 72 hours  Recent Labs  12/25/13 0606  NA 139  K 4.4  CL 103  GLUCOSE 104*  BUN 13  CREATININE 0.76  CALCIUM 9.1   CBG (last 3)  No results found for this basename: GLUCAP,  in the last 72 hours  Wt Readings from Last 3 Encounters:  12/21/13 52.073 kg (114 lb 12.8 oz)  12/10/13 49.9 kg (110 lb 0.2 oz)  03/27/13 54.069 kg (119 lb 3.2 oz)    Physical Exam:  Constitutional: She is oriented to person, place, and time. She appears well-developed and well-nourished. No distress HENT:  Head: Normocephalic. No tenderness over frontal sinuses Eyes: Conjunctivae are normal. Pupils are equal, round, and reactive to light.  Neck: Normal range of motion.  Cardiovascular: Normal rate and regular rhythm.  Respiratory: Effort normal and breath sounds normal. No respiratory distress. She has no wheezes.  GI: Soft. Bowel sounds are normal. She exhibits no distension. There is no tenderness.  Musculoskeletal: She exhibits no edema. Right hip/troch moderately tender to palpation and ROM Neurological: She is alert and oriented to person, place, and time.  Still with halting speech/accent--clearer. Follows basic commands without difficulty. Good recall with fair insight and awareness. Dysmetria better. No nystagmus. Decreased fine motor bilateral upper but somewhat improving. Strength grossly  4-/5 with more weakness in the left upper.   Psychiatric: She has a normal mood and affect. Her behavior is normal     Assessment/Plan: 1. Functional  deficits secondary to traumatic brain injury with skull fx which require 3+ hours per day of interdisciplinary therapy in a comprehensive inpatient rehab setting. Physiatrist is providing close team supervision and 24 hour management of active medical problems listed below. Physiatrist and rehab team continue to assess barriers to discharge/monitor patient progress toward functional and medical goals.   FIM: FIM - Bathing Bathing Steps Patient Completed: Chest;Right Arm;Left Arm;Abdomen;Front perineal area;Left lower leg (including foot);Right lower leg (including foot);Left upper leg;Right upper leg;Buttocks Bathing: 5: Supervision: Safety issues/verbal cues  FIM - Upper Body Dressing/Undressing Upper body dressing/undressing steps patient completed: Thread/unthread right sleeve of pullover shirt/dresss;Thread/unthread left sleeve of pullover shirt/dress;Put head through opening of pull over shirt/dress;Pull shirt over trunk Upper body dressing/undressing: 6: More than reasonable amount of time FIM - Lower Body Dressing/Undressing Lower body dressing/undressing steps patient completed: Thread/unthread right pants leg;Thread/unthread left pants leg;Pull pants up/down;Don/Doff right sock;Don/Doff left sock Lower body dressing/undressing: 5: Supervision: Safety issues/verbal cues  FIM - Toileting Toileting steps completed by patient: Adjust clothing prior to toileting;Performs perineal hygiene;Adjust clothing after toileting Toileting Assistive Devices: Grab bar or rail for support Toileting: 5: Supervision: Safety issues/verbal cues  FIM - Radio producer Devices: Grab bars;Walker Toilet Transfers: 4-From toilet/BSC: Min A (steadying Pt. > 75%);4-To toilet/BSC: Min A (steadying Pt. > 75%)  FIM - Bed/Chair Transfer Bed/Chair Transfer Assistive Devices: Copy: 5: Chair or W/C > Bed: Supervision (verbal cues/safety issues)  FIM - Locomotion:  Wheelchair Distance: 40' Locomotion: Wheelchair: 0: Activity did not occur FIM - Locomotion: Ambulation Locomotion: Ambulation Assistive Devices:  Walker - Rolling Ambulation/Gait Assistance: 4: Min guard;5: Supervision Locomotion: Ambulation: 4: Travels 150 ft or more with minimal assistance (Pt.>75%)  Comprehension Comprehension Mode: Auditory Comprehension: 5-Understands basic 90% of the time/requires cueing < 10% of the time  Expression Expression Mode: Verbal Expression: 5-Expresses basic 90% of the time/requires cueing < 10% of the time.  Social Interaction Social Interaction: 5-Interacts appropriately 90% of the time - Needs monitoring or encouragement for participation or interaction.  Problem Solving Problem Solving: 5-Solves basic 90% of the time/requires cueing < 10% of the time  Memory Memory: 5-Recognizes or recalls 90% of the time/requires cueing < 10% of the time  Medical Problem List and Plan:  1. 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 DVT Prophylaxis/Anticoagulation: SCDs. Monitor for any signs of DVT  3. Pain Management: Ultram and Robaxin as needed. Monitor with increased mobility   -continue topamax for prophylaxis, 25mg  qhs  -ice to right hip, stretching--tolerable at this point 4. Neuropsych: This patient is capable of making decisions on her own behalf.  5. Hyponatremia:dc'ed mild fluid restriction  -sodium 139 today    6. GERD: protonix, maalox started  LOS (Days) 11 A FACE TO FACE EVALUATION WAS PERFORMED  SWARTZ,ZACHARY T 12/25/2013 7:55 AM

## 2013-12-25 NOTE — Plan of Care (Signed)
Problem: RH PAIN MANAGEMENT Goal: RH STG PAIN MANAGED AT OR BELOW PT'S PAIN GOAL Pain level 3 or less on a scale of 0-10.  Outcome: Progressing No c/o pain     

## 2013-12-25 NOTE — Progress Notes (Signed)
Occupational Therapy Session Note  Patient Details  Name: Claudia Perez MRN: [id number] Date of Birth: [birth year]  Today's Date: 12/25/2013 Time: 1005-1100 Time Calculation (min): 55 min  Short Term Goals: Week 1:  OT Short Term Goal 1 (Week 1): Pt will transfer to toilet with steady A  OT Short Term Goal 1 - Progress (Week 1): Met OT Short Term Goal 2 (Week 1): Pt will dress LB with min A  OT Short Term Goal 2 - Progress (Week 1): Met OT Short Term Goal 3 (Week 1): Pt will demonstrate dynamic standing balance with min A during functional tasks without UE support  OT Short Term Goal 3 - Progress (Week 1): Met OT Short Term Goal 4 (Week 1): Pt will ambulate with LRAD to obtain clothing for dressing with min  A for balance OT Short Term Goal 4 - Progress (Week 1): Met  Skilled Therapeutic Interventions/Progress Updates:    Session 1: Pt was dressed and seated in wc upon arrival.  Pt ambulated with RW to therapy gym.  Pt practiced functional ambulation with RW around therapy gym to locate items working on safety awareness and manipulation of RW.  Pt participated in therapeutic activity of standing on balance mat while reaching for items in several planes, standing on balance mat to reach for items hung above eye level, fold items and replace. Pt stood with RW to toss items to target working on dynamic standing balance. Pt required multiple rest breaks throughout session and required min-mod verbal cues for safety using RW.  Pt ambulated with RW back to hospital room.  Pt had no c/o pain. Focus on: increasing dynamic standing balance, safety awareness, increasing activity tolerance and endurance.     Time 13:00-13:30 Session 2:  Pt was in wc upon arrival.  Pt participated in functional ambulation skills tasks as related to home management/IADLs.  Pt ambulated with RW to arrange plants, trim plants and water plants with min verbal cues for safety.  Pt required one rest break before ambulating  approx. 50 feet with RW. Pt had no c/o pain.  Focus On: safety awareness when ambulating with RW, functional ambulation skills with RW, and increased activity tolerance.    Therapy Documentation Precautions:  Precautions Precautions: Fall Precaution Comments: Spoke with Dr Luiz Ochoa who states no Vestibular testing for a week from today to allow more healing time prior to performing any quick head movements or dangling head off of bed.   Restrictions Weight Bearing Restrictions: No     See FIM for current functional status  Therapy/Group: Individual Therapy  Shipley,Sheila 12/25/2013, 11:47 AM  Note reviewed and accurately reflects treatment session.

## 2013-12-25 NOTE — Discharge Summary (Signed)
Discharge summary job # [id number]

## 2013-12-25 NOTE — Plan of Care (Signed)
Problem: RH BOWEL ELIMINATION Goal: RH STG MANAGE BOWEL W/MEDICATION W/ASSISTANCE STG Manage Bowel with Medication with Assistance. Mod I  Outcome: Not Applicable Date Met:  02/56/15 Pt refusing laxative

## 2013-12-25 NOTE — Discharge Summary (Signed)
NAMELECHELLE, WRIGLEY                ACCOUNT NO.:  [id number]  MEDICAL RECORD NO.:  [id number]  LOCATION:  [id number]                        FACILITY:  [facility]  PHYSICIAN:  Meredith Staggers, M.D.DATE OF BIRTH:  May 02, 1951  DATE OF ADMISSION:  12/14/2013 DATE OF DISCHARGE:  12/29/2013                              DISCHARGE SUMMARY   DISCHARGE DIAGNOSES: 1. Traumatic brain injury -  skull fracture after a fall.  History of     traumatic brain injuries. 2. Sequential compression devices for DVT prophylaxis. 3. Pain management. 4. Hyponatremia, resolved. 5. Gastroesophageal reflux disease.  HISTORY OF PRESENT ILLNESS:  This is a 63 year old right-handed female with history of seizures as well as subdural hematoma with burr hole evacuation in 2004 while living in [place] with gait disorder who fell down 17 steps on December 09, 2013, with subsequent traumatic brain injury.  The patient on no scheduled medications prior to admission. The fall was unwitnessed, but the patient reports dizziness prior to fall, no loss of consciousness or seizure activity.  CT of the head showed a nondisplaced left parietal skull fracture with large left parietal and occipital scalp hematomas, small subdural hematoma, right frontal intraparenchymal hemorrhage as well as SAH in bilateral frontal and right temporal regions.  She was evaluated by Neurosurgery and advised serial cranial CT scans to be recommended.  Followup cranial CT scan on December 11, 2013, with modest increase in hemorrhagic contusions.  CT thoracic and cervical spine negative for fracture.  She was tolerating a regular diet.  Physical, occupational, and speech therapy ongoing.  The patient was admitted for comprehensive rehab program.  PAST MEDICAL HISTORY:  See discharge diagnoses.  SOCIAL HISTORY:  Lives with spouse.  FUNCTIONAL HISTORY:  Prior to admission, ambulatory, but refused assistive device.  FUNCTIONAL STATUS:  Upon admission to  rehab services was ambulating 150 feet with ataxic gait, minimal assist for stand to sit, minimal assist sit to stand.  PHYSICAL EXAMINATION:  VITAL SIGNS:  Blood pressure 132/54, pulse 71, temperature 98.3, respirations 18. GENERAL:  This was an alert female.  She was oriented to person, place, and time.  She was well nourished.  She did have a halting speech.  She followed basic commands.  She did show some good recall and fair insight to awareness. HEENT:  Pupils round and reactive to light. LUNGS:  Clear to auscultation. CARDIAC:  Regular rate and rhythm. ABDOMEN:  Soft, nontender.  Good bowel sounds.  REHABILITATION HOSPITAL COURSE:  The patient was admitted to inpatient rehab services with therapies initiated on a 3-hour daily basis consisting of physical therapy, occupational therapy, speech therapy, and rehabilitation nursing.  The following issues were addressed during the patient's rehabilitation stay.  Pertaining to Ms. Alcoser's traumatic brain injury with skull fracture remained stable, she would follow up with Neurosurgery, Dr. Luiz Ochoa.  Sequential compression device in place for DVT prophylaxis.  Pain management with the use of Robaxin as well as Ultram and Topamax for headaches.  Hyponatremia had since resolved after fluid restriction.  Latest sodium level of 139, improved from 127.  The patient received weekly collaborative interdisciplinary team conferences to discuss estimated length of stay, family teaching, and any  barriers to her discharge.  She was ambulating 200 feet with a rolling walker, supervision to minimal guard.  She performed cone taps 3 sets x10 repetitions each, navigating stairs with minimal assistance.  She is able to descend stairs with minimum to moderate assistance.  She was able to demonstrate transfers to and from shower chair.  She demonstrated understanding and knowledge by performing shower transfers Successfully. Family could not provide the  necessary supervision at home skilled nursing facility was recommended with bed becoming available 12/28/2013  DISCHARGE MEDICATIONS: 1. Tylenol as needed. 2. Robaxin 500 to 1000 mg every 6 hours as needed muscle spasms. 3. Protonix 40 mg p.o. daily. 4. MiraLax 17 g daily with 8 ounces of water, hold for loose stools. 5. Topamax 25 mg p.o. at bedtime. 6. Ultram 50-100 mg every 6 hours as needed pain.  DIET:  Regular.  SPECIAL INSTRUCTIONS:  The patient to follow up with Dr. Luiz Ochoa, Neurosurgery, call for appointment.  Dr. Alger Simons at the outpatient rehab service office as directed.  Dr. Leighton Ruff, medical management.     Lauraine Rinne, P.A.   ______________________________ Meredith Staggers, M.D.    DA/MEDQ  D:  12/25/2013  T:  12/25/2013  Job:  [id number]  cc:   Otilio Connors, M.D. Leighton Ruff, MD

## 2013-12-25 NOTE — Progress Notes (Signed)
Physical Therapy Session Note  Patient Details  Name: Claudia Perez MRN: [id number] Date of Birth: 10-04-1951  Today's Date: 12/25/2013 Time: Treatment Session 1: 1005-1100; Treatment Session 2: 3716-9678 Time Calculation (min): Treatment Session 1: 55 min; Treatment Session 2: 28min  Short Term Goals: Week 2:  PT Short Term Goal 1 (Week 2): STGs=LTGs due to anticipated LOS  Skilled Therapeutic Interventions/Progress Updates:  Treatment Session 1:  1:1. Pt received sitting in w/c, ready for therapy. Focus this session on gait training, reassessment of dynamic standing balance and B LE strength/endurance. Pt able to amb 165', 100' and 185' this session w/ RW as well as negotiate up/down 16 steps w/ B rail and overall (S). Min guard assist for short distance amb in room. Intermittent min cueing for correction of posterior lean. Re-performed Berg Balance Test, pt scored 34/56 demonstrating significant improvement in balance compared to 10/56 at time of admission. However, pt continues to remain at significant risk for falls despite improvement and therefore continues to benefit from use of RW and (S), pt verbalized understanding. Use of NuStep to target B UE/LE strength/endurance, level 3-4x28min w/ good tolerance. Pt sitting in w/c at end of session w/ all needs in reach.   Treatment Session 2:  1:1. Pt received sitting in w/c, ready for therapy. Focus this session on distance amb w/ RW in controlled environments as well as short distance amb w/out RW in home environments and during obstacle negotiation. Pt consistently req overall (S) w/ use of RW and min guard to min A w/out use of RW. Pt sitting in w/c at end of session w/ all needs in reach.   Therapy Documentation Precautions:  Precautions Precautions: Fall Precaution Comments: Spoke with Dr Luiz Ochoa who states no Vestibular testing for a week from today to allow more healing time prior to performing any quick head movements or dangling head  off of bed.   Restrictions Weight Bearing Restrictions: No   Pain: Pain Assessment Pain Assessment: No/denies pain    Balance: Standardized Balance Assessment Standardized Balance Assessment: Berg Balance Test Berg Balance Test Sit to Stand: Able to stand  independently using hands Standing Unsupported: Able to stand 2 minutes with supervision Sitting with Back Unsupported but Feet Supported on Floor or Stool: Able to sit safely and securely 2 minutes Stand to Sit: Sits safely with minimal use of hands Transfers: Able to transfer safely, definite need of hands Standing Unsupported with Eyes Closed: Able to stand 10 seconds with supervision Standing Ubsupported with Feet Together: Able to place feet together independently and stand for 1 minute with supervision From Standing, Reach Forward with Outstretched Arm: Reaches forward but needs supervision From Standing Position, Pick up Object from Floor: Able to pick up shoe, needs supervision From Standing Position, Turn to Look Behind Over each Shoulder: Turn sideways only but maintains balance Turn 360 Degrees: Needs close supervision or verbal cueing Standing Unsupported, Alternately Place Feet on Step/Stool: Able to complete >2 steps/needs minimal assist Standing Unsupported, One Foot in Front: Able to take small step independently and hold 30 seconds Standing on One Leg: Tries to lift leg/unable to hold 3 seconds but remains standing independently Total Score: 34  See FIM for current functional status  Therapy/Group: Individual Therapy  Gilmore Laroche 12/25/2013, 11:00 AM

## 2013-12-25 NOTE — Discharge Instructions (Signed)
Inpatient Rehab Discharge Instructions  Nanako Stopher Discharge date and time: No discharge date for patient encounter.   Activities/Precautions/ Functional Status: Activity: activity as tolerated Diet: regular diet Wound Care: none needed Functional status:  ___ No restrictions     ___ Walk up steps independently ___ 24/7 supervision/assistance   ___ Walk up steps with assistance ___ Intermittent supervision/assistance  ___ Bathe/dress independently ___ Walk with walker     ___ Bathe/dress with assistance ___ Walk Independently    ___ Shower independently _x__ Walk with assistance    ___ Shower with assistance ___ No alcohol     ___ Return to work/school ________  Special Instructions:    My questions have been answered and I understand these instructions. I will adhere to these goals and the provided educational materials after my discharge from the hospital.  Patient/Caregiver Signature _______________________________ Date __________  Clinician Signature _______________________________________ Date __________  Please bring this form and your medication list with you to all your follow-up doctor's appointments.

## 2013-12-26 ENCOUNTER — Inpatient Hospital Stay (HOSPITAL_COMMUNITY): Payer: Medicare Other | Admitting: Speech Pathology

## 2013-12-26 ENCOUNTER — Inpatient Hospital Stay (HOSPITAL_COMMUNITY): Payer: Medicare Other

## 2013-12-26 ENCOUNTER — Encounter (HOSPITAL_COMMUNITY): Payer: Medicare Other

## 2013-12-26 NOTE — Progress Notes (Signed)
Note reviewed and accurately reflects treatment session.   

## 2013-12-26 NOTE — Patient Care Conference (Signed)
Inpatient RehabilitationTeam Conference and Plan of Care Update Date: 12/26/2013   Time: 3:10 PM    Patient Name: Claudia Perez      Medical Record Number: [id number]  Date of Birth: 10/30/1951 Sex: Female         Room/Bed: 4M07C/4M07C-01 Payor Info: Payor: MEDICARE / Plan: MEDICARE PART A AND B / Product Type: *No Product type* /    Admitting Diagnosis: TBI  Admit Date/Time:  12/14/2013  6:20 PM Admission Comments: No comment available   Primary Diagnosis:  <principal problem not specified> Principal Problem: <principal problem not specified>  Patient Active Problem List   Diagnosis Date Noted  . TBI (traumatic brain injury) 12/14/2013  . Fall down stairs 12/11/2013  . Basilar skull fracture 12/11/2013  . Traumatic intracranial hemorrhage 12/11/2013  . Back pain 12/11/2013  . Subarachnoid hemorrhage following injury 12/09/2013  . Syncope 03/27/2013  . Clavicular fracture 03/27/2013  . COUGH 12/28/2007    Expected Discharge Date: Expected Discharge Date:  (SNF)  Team Members Present: Physician leading conference: Dr. Alger Simons Social Worker Present: Lennart Pall, LCSW Nurse Present: Janyth Pupa, RN PT Present: Melene Plan, Cottie Banda, PT OT Present: Roanna Epley, COTA;Other (comment);Forde Radon, OT (Jeri Modena, OT) SLP Present: Weston Anna, SLP PPS Coordinator present : Daiva Nakayama, RN, CRRN     Current Status/Progress Goal Weekly Team Focus  Medical   progressing. gerd issues  see prior  see prior, gerd   Bowel/Bladder   Continent of bowel and bladder. LBM 12/25/13  Pt to remain continent of bowel and bladder  Monitor   Swallow/Nutrition/ Hydration             ADL's   close supervision BADLs; occasional steady A for shower transfers  supervision overall; mod I toilet transfers and toileting  safety awareness, activity tolerance, dynamic standing balance   Mobility   At goal level  Supervision-Min A during standing household mobility. W/c goal  for home environment d/c as ambulation is pt's primary means of mobility.   Functional endurance, safety during dynamic standing balance tasks, gross coordination   Communication   Mod I  Mod I  diaphragmatic breathing    Safety/Cognition/ Behavioral Observations  Supervision  Supervision  mildly complex problem solving, working memory    Pain   No c/o pain  <3  Monitor for nonverbal cues   Skin   CDI  CDI  Assess q shift    Rehab Goals Patient on target to meet rehab goals: Yes *See Care Plan and progress notes for long and short-term goals.  Barriers to Discharge: still requires supervision    Possible Resolutions to Barriers:  snf short term    Discharge Planning/Teaching Needs:  plan changed to SNF as family cannot provide 24/7 and she requires minimal assist      Team Discussion:  D/c plan now changed to SNF.  Making some improvements in toilet transfers, however, still needs 24/7 supervision/ assist.  Revisions to Treatment Plan:  Change in d/c plan to SNF   Continued Need for Acute Rehabilitation Level of Care: The patient requires daily medical management by a physician with specialized training in physical medicine and rehabilitation for the following conditions: Daily direction of a multidisciplinary physical rehabilitation program to ensure safe treatment while eliciting the highest outcome that is of practical value to the patient.: Yes Daily medical management of patient stability for increased activity during participation in an intensive rehabilitation regime.: Yes Daily analysis of laboratory values and/or radiology reports with  any subsequent need for medication adjustment of medical intervention for : Neurological problems;Other  [name] 12/26/2013, 4:20 PM

## 2013-12-26 NOTE — Progress Notes (Signed)
Physical Therapy Session Note  Patient Details  Name: Idabell Picking MRN: [id number] Date of Birth: Mar 02, 1951  Today's Date: 12/26/2013 Time: 0830-0930 Time Calculation (min): 60 min  Short Term Goals: Week 2:  PT Short Term Goal 1 (Week 2): STGs=LTGs due to anticipated LOS  Skilled Therapeutic Interventions/Progress Updates:  1:1. Pt received sitting in w/c, ready for therapy. Supervision during amb 200'x2 w/ RW. Focus majority of session on core, shoulder and hip stabilization exercises in supine and seated positioning. Exercises included: scap retraction/elevation/depression, glute sets, bridging, hip abd/add in hooklying, B LE rotation in hooklying. Trial tall kneeling and quadruped positioning, but pt unable to tolerate 2/2 discomfort in B shoulders and knees. Pt w/ good tolerance to NuStep, level 4x10 min, to target B UE/LE strength/endurance. Pt sitting in w/c at end of session w/ all needs in reach.   Therapy Documentation Precautions:  Precautions Precautions: Fall Precaution Comments: Spoke with Dr Luiz Ochoa who states no Vestibular testing for a week from today to allow more healing time prior to performing any quick head movements or dangling head off of bed.   Restrictions Weight Bearing Restrictions: No  See FIM for current functional status  Therapy/Group: Individual Therapy  Gilmore Laroche 12/26/2013, 9:31 AM

## 2013-12-26 NOTE — Progress Notes (Signed)
Occupational Therapy Session Note  Patient Details  Name: Claudia Perez MRN: [id number] Date of Birth: January 03, 1951  Today's Date: 12/26/2013 Time: 0700-0800 Time Calculation (min): 60 min  Short Term Goals: Week 1:  OT Short Term Goal 1 (Week 1): Pt will transfer to toilet with steady A  OT Short Term Goal 1 - Progress (Week 1): Met OT Short Term Goal 2 (Week 1): Pt will dress LB with min A  OT Short Term Goal 2 - Progress (Week 1): Met OT Short Term Goal 3 (Week 1): Pt will demonstrate dynamic standing balance with min A during functional tasks without UE support  OT Short Term Goal 3 - Progress (Week 1): Met OT Short Term Goal 4 (Week 1): Pt will ambulate with LRAD to obtain clothing for dressing with min  A for balance OT Short Term Goal 4 - Progress (Week 1): Met  Skilled Therapeutic Interventions/Progress Updates:    Pt was asleep in bed upon arrival. Pt easily aroused and agreed to participate in showering activity.  Pt required max A to go from supine to sitting, and steady assist to go from sitting EOB to standing with RW.  Pt ambulated with RW to collect clothing and personal items from around room with min safety cues and then transferred from stand to sitting on toilet with supervision.  Pt performed toileting routing and then ambulated utilizing RW to sit on shower chair.  Pt performed all UB/LB bathing while seated on shower chair with supervision A.  Pt then dressed UB/LB while seated on shower chair with min verbal cues.  Pt transferred from seated on shower chair to standing with RW and ambulated to stand at sink.  Pt stood at sink to complete oral care with supervision A.  Pt had no c/o pain.  Focus on:  Increasing independence in ADL performance, increasing safety awareness utilizing RW and increasing ambulation skills with RW.   Therapy Documentation Precautions:  Precautions Precautions: Fall Precaution Comments: Spoke with Dr Luiz Ochoa who states no Vestibular testing for a  week from today to allow more healing time prior to performing any quick head movements or dangling head off of bed.   Restrictions Weight Bearing Restrictions: No    Therapy/Group: Individual Therapy  Kourtney Montesinos 12/26/2013, 10:03 AM

## 2013-12-26 NOTE — Progress Notes (Signed)
Speech Language Pathology Daily Session Note  Patient Details  Name: Claudia Perez MRN: [id number] Date of Birth: Dec 11, 1950  Today's Date: 12/26/2013 Time: 1000-1030 Time Calculation (min): 30 min  Short Term Goals: Week 2: SLP Short Term Goal 1 (Week 2): Pt and husband will verbalize/demonstrate understanding of memory compensatory strategies with Mod I.   Skilled Therapeutic Interventions: Treatment focus on cognitive goals. SLP facilitated session by providing supervision verbal and question cues for mildly complex problem solving and organization with a new learning task. Pt recalled rules of task with Mod I. Continue plan of care.    FIM:  Comprehension Comprehension Mode: Auditory Comprehension: 5-Understands basic 90% of the time/requires cueing < 10% of the time Expression Expression Mode: Verbal Expression: 5-Expresses basic 90% of the time/requires cueing < 10% of the time. Social Interaction Social Interaction: 5-Interacts appropriately 90% of the time - Needs monitoring or encouragement for participation or interaction. Problem Solving Problem Solving: 5-Solves complex 90% of the time/cues < 10% of the time Memory Memory: 5-Recognizes or recalls 90% of the time/requires cueing < 10% of the time  Pain Pain Assessment Pain Assessment: No/denies pain  Therapy/Group: Individual Therapy  Emran Molzahn 12/26/2013, 1:04 PM

## 2013-12-26 NOTE — Progress Notes (Signed)
Social Work Patient ID: Claudia Perez, female   DOB: [birth year], 63 y.o.   MRN: [id number]   Claudia Pall, LCSW Social Worker Signed  Patient Care Conference Service date: 12/26/2013 4:20 PM  Inpatient RehabilitationTeam Conference and Plan of Care Update Date: 12/26/2013   Time: 3:10 PM     Patient Name: Claudia Perez       Medical Record Number: [id number]   Date of Birth: 01/06/1951 Sex: Female         Room/Bed: 4M07C/4M07C-01 Payor Info: Payor: MEDICARE / Plan: MEDICARE PART A AND B / Product Type: *No Product type* /   Admitting Diagnosis: TBI   Admit Date/Time:  12/14/2013  6:20 PM Admission Comments: No comment available   Primary Diagnosis:  <principal problem not specified> Principal Problem: <principal problem not specified>    Patient Active Problem List     Diagnosis  Date Noted   .  TBI (traumatic brain injury)  12/14/2013   .  Fall down stairs  12/11/2013   .  Basilar skull fracture  12/11/2013   .  Traumatic intracranial hemorrhage  12/11/2013   .  Back pain  12/11/2013   .  Subarachnoid hemorrhage following injury  12/09/2013   .  Syncope  03/27/2013   .  Clavicular fracture  03/27/2013   .  COUGH  12/28/2007     Expected Discharge Date: Expected Discharge Date:  (SNF)  Team Members Present: Physician leading conference: Dr. Alger Simons Social Worker Present: Claudia Pall, LCSW Nurse Present: Janyth Pupa, RN PT Present: Melene Plan, Cottie Banda, PT OT Present: Roanna Epley, COTA;Other (comment);Forde Radon, OT (Jeri Modena, OT) SLP Present: Weston Anna, SLP PPS Coordinator present : Daiva Nakayama, RN, CRRN        Current Status/Progress  Goal  Weekly Team Focus   Medical     progressing. gerd issues  see prior  see prior, gerd   Bowel/Bladder     Continent of bowel and bladder. LBM 12/25/13  Pt to remain continent of bowel and bladder  Monitor   Swallow/Nutrition/ Hydration            ADL's     close supervision BADLs; occasional steady A  for shower transfers  supervision overall; mod I toilet transfers and toileting  safety awareness, activity tolerance, dynamic standing balance   Mobility     At goal level  Supervision-Min A during standing household mobility. W/c goal for home environment d/c as ambulation is pt's primary means of mobility.   Functional endurance, safety during dynamic standing balance tasks, gross coordination   Communication     Mod I  Mod I  diaphragmatic breathing    Safety/Cognition/ Behavioral Observations    Supervision  Supervision  mildly complex problem solving, working memory    Pain     No c/o pain  <3  Monitor for nonverbal cues   Skin     CDI  CDI  Assess q shift    Rehab Goals Patient on target to meet rehab goals: Yes *See Care Plan and progress notes for long and short-term goals.    Barriers to Discharge:  still requires supervision      Possible Resolutions to Barriers:    snf short term      Discharge Planning/Teaching Needs:    plan changed to SNF as family cannot provide 24/7 and she requires minimal assist      Team Discussion:    D/c plan now changed to SNF.  Making some  improvements in toilet transfers, however, still needs 24/7 supervision/ assist.   Revisions to Treatment Plan:    Change in d/c plan to SNF    Continued Need for Acute Rehabilitation Level of Care: The patient requires daily medical management by a physician with specialized training in physical medicine and rehabilitation for the following conditions: Daily direction of a multidisciplinary physical rehabilitation program to ensure safe treatment while eliciting the highest outcome that is of practical value to the patient.: Yes Daily medical management of patient stability for increased activity during participation in an intensive rehabilitation regime.: Yes Daily analysis of laboratory values and/or radiology reports with any subsequent need for medication adjustment of medical intervention for :  Neurological problems;Other  Claudia Perez 12/26/2013, 4:20 PM         Claudia Pall, LCSW Social Worker Signed  Patient Care Conference Service date: 12/20/2013 2:44 PM  Inpatient RehabilitationTeam Conference and Plan of Care Update Date: 12/19/2013   Time: 2:50 PM     Patient Name: Claudia Perez       Medical Record Number: [id number]   Date of Birth: Aug 25, 1951 Sex: Female         Room/Bed: 4M07C/4M07C-01 Payor Info: Payor: MEDICARE / Plan: MEDICARE PART A AND B / Product Type: *No Product type* /   Admitting Diagnosis: TBI   Admit Date/Time:  12/14/2013  6:20 PM Admission Comments: No comment available   Primary Diagnosis:  <principal problem not specified> Principal Problem: <principal problem not specified>    Patient Active Problem List     Diagnosis  Date Noted   .  TBI (traumatic brain injury)  12/14/2013   .  Fall down stairs  12/11/2013   .  Basilar skull fracture  12/11/2013   .  Traumatic intracranial hemorrhage  12/11/2013   .  Back pain  12/11/2013   .  Subarachnoid hemorrhage following injury  12/09/2013   .  Syncope  03/27/2013   .  Clavicular fracture  03/27/2013   .  COUGH  12/28/2007     Expected Discharge Date: Expected Discharge Date: 12/26/13  Team Members Present: Physician leading conference: Dr. Alger Simons Social Worker Present: Claudia Pall, LCSW Nurse Present: Other (comment) Salena Saner, RN) PT Present: Melene Plan, PT OT Present: Roanna Epley, [place], OT;Karen [place], OT SLP Present: Weston Anna, SLP PPS Coordinator present : Daiva Nakayama, RN, CRRN        Current Status/Progress  Goal  Weekly Team Focus   Medical     tbi with skull fx after fall, balance issues, pain  improving pain control and balance to improve functional mobility  improve pain control, headaches   Bowel/Bladder     continent of bowel and bladder  remains continent of bowel and bladder w/ regular BMs  assess bowel and bladder function daily,Q shift    Swallow/Nutrition/ Hydration            ADL's     min A overall, decreased activity tolerance  supervision overall  activity tolerance, transfers, safety awareness   Mobility     Min-Mod A  Mod(I)-Supervision  standing balance, functional endurance, safety, trial ADs   Communication     Supervision  Mod I  diaphragmatic breathing, speech intelligibility    Safety/Cognition/ Behavioral Observations    Min A  Supervision  working memory, functional problem solving    Pain     c/o head ache w/ relief from robaxin prn  pain relief  assess for pain and for  relief    Skin     intact  remins intact      Rehab Goals Patient on target to meet rehab goals: Yes *See Care Plan and progress notes for long and short-term goals.    Barriers to Discharge:  premorbid deficits, pain      Possible Resolutions to Barriers:    NMR, pain control, patient education      Discharge Planning/Teaching Needs:    home with family to provide intermittent assistance      Team Discussion:    Ataxic somewhat improved.  Making good progress overall.  Will definitely need to use walker at home for safety.  Hoping to reach mod i w/c level.  Uncertain how close to baseline pt is with cognition.  Little insight from husband.   Revisions to Treatment Plan:    None    Continued Need for Acute Rehabilitation Level of Care: The patient requires daily medical management by a physician with specialized training in physical medicine and rehabilitation for the following conditions: Daily direction of a multidisciplinary physical rehabilitation program to ensure safe treatment while eliciting the highest outcome that is of practical value to the patient.: Yes Daily analysis of laboratory values and/or radiology reports with any subsequent need for medication adjustment of medical intervention for : Neurological problems;Other  Claudia Perez 12/20/2013, 2:44 PM

## 2013-12-26 NOTE — Progress Notes (Deleted)
Occupational Therapy Session Note  Patient Details  Name: Claudia Perez MRN: [id number] Date of Birth: 06-Jul-1951  Today's Date: 12/26/2013 Time: 0347-4259   Short Term Goals: Week 1:  OT Short Term Goal 1 (Week 1): Pt will transfer to toilet with steady A  OT Short Term Goal 1 - Progress (Week 1): Met OT Short Term Goal 2 (Week 1): Pt will dress LB with min A  OT Short Term Goal 2 - Progress (Week 1): Met OT Short Term Goal 3 (Week 1): Pt will demonstrate dynamic standing balance with min A during functional tasks without UE support  OT Short Term Goal 3 - Progress (Week 1): Met OT Short Term Goal 4 (Week 1): Pt will ambulate with LRAD to obtain clothing for dressing with min  A for balance OT Short Term Goal 4 - Progress (Week 1): Met  Skilled Therapeutic Interventions/Progress Updates:    Pt was in wc upon arrival.  Pt participated in several transfers from standing with RW<> EOB bed <> supine in bed in therapy apartment.  Pt practiced bed mobility rolling side to side, and was educated on how to use LE as an achor to help pt sit up.  Pt c/o/ slight dizziness when rolling and sitting up and therapist educated pt on spotting on the wall to calm feeling of dizziness.   Pt demonstrated understanding of new technique(s) to sit up and to help ward off dizziness.  Pt ambulated with RW from therapy apartment to therapy gym.  Pt took several rest breaks in between activities and required min verbal cues for safety awareness when going from sit to stand to utilize arm rests when standing.  Pt stood with RW and participated in activity that allowed her to reach and use R UE in several planes to write on board while working on dynamic standing balance.  Pt ambulated with RW from therapy gym back to hospital room. Pt had no c/o pain.  Focus on: Bed mobility, increasing dynamic standing balance and safety awareness.    Therapy Documentation Precautions:  Precautions Precautions: Fall Precaution  Comments: Spoke with Dr Luiz Ochoa who states no Vestibular testing for a week from today to allow more healing time prior to performing any quick head movements or dangling head off of bed.   Restrictions Weight Bearing Restrictions: No    See FIM for current functional status  Therapy/Group: Individual Therapy  Shipley,Sheila 12/26/2013, 2:14 PM

## 2013-12-26 NOTE — Progress Notes (Signed)
Subjective/Complaints: Good night. Denies pain A 12 point review of systems has been performed and if not noted above is otherwise negative.   Objective: Vital Signs: Blood pressure 120/81, pulse 75, temperature 97.7 F (36.5 C), temperature source Oral, resp. rate 18, height 5\' 1"  (1.549 m), weight 52.073 kg (114 lb 12.8 oz), SpO2 97.00%. No results found. No results found for this basename: WBC, HGB, HCT, PLT,  in the last 72 hours  Recent Labs  12/25/13 0606  NA 139  K 4.4  CL 103  GLUCOSE 104*  BUN 13  CREATININE 0.76  CALCIUM 9.1   CBG (last 3)  No results found for this basename: GLUCAP,  in the last 72 hours  Wt Readings from Last 3 Encounters:  12/21/13 52.073 kg (114 lb 12.8 oz)  12/10/13 49.9 kg (110 lb 0.2 oz)  03/27/13 54.069 kg (119 lb 3.2 oz)    Physical Exam:  Constitutional: She is oriented to person, place, and time. She appears well-developed and well-nourished. No distress HENT:  Head: Normocephalic. No tenderness over frontal sinuses Eyes: Conjunctivae are normal. Pupils are equal, round, and reactive to light.  Neck: Normal range of motion.  Cardiovascular: Normal rate and regular rhythm.  Respiratory: Effort normal and breath sounds normal. No respiratory distress. She has no wheezes.  GI: Soft. Bowel sounds are normal. She exhibits no distension. There is no tenderness.  Musculoskeletal: She exhibits no edema. Right hip/troch moderately tender to palpation and ROM Neurological: She is alert and oriented to person, place, and time.  Still with halting speech/accent--clearer. Follows basic commands without difficulty. Good recall with fair insight and awareness. Dysmetria better. No nystagmus. Decreased fine motor bilateral upper but somewhat improving. Strength grossly  4-/5 with more weakness in the left upper.   Psychiatric: She has a normal mood and affect. Her behavior is normal     Assessment/Plan: 1. Functional deficits secondary to  traumatic brain injury with skull fx which require 3+ hours per day of interdisciplinary therapy in a comprehensive inpatient rehab setting. Physiatrist is providing close team supervision and 24 hour management of active medical problems listed below. Physiatrist and rehab team continue to assess barriers to discharge/monitor patient progress toward functional and medical goals.  Now looking at SNF   FIM: FIM - Bathing Bathing Steps Patient Completed: Chest;Right Arm;Left Arm;Abdomen;Front perineal area;Left lower leg (including foot);Right lower leg (including foot);Left upper leg;Right upper leg;Buttocks Bathing: 5: Supervision: Safety issues/verbal cues  FIM - Upper Body Dressing/Undressing Upper body dressing/undressing steps patient completed: Thread/unthread right sleeve of pullover shirt/dresss;Thread/unthread left sleeve of pullover shirt/dress;Put head through opening of pull over shirt/dress;Pull shirt over trunk Upper body dressing/undressing: 6: More than reasonable amount of time FIM - Lower Body Dressing/Undressing Lower body dressing/undressing steps patient completed: Thread/unthread right pants leg;Thread/unthread left pants leg;Pull pants up/down;Don/Doff right sock;Don/Doff left sock Lower body dressing/undressing: 5: Supervision: Safety issues/verbal cues  FIM - Toileting Toileting steps completed by patient: Adjust clothing prior to toileting;Performs perineal hygiene;Adjust clothing after toileting Toileting Assistive Devices: Grab bar or rail for support Toileting: 5: Supervision: Safety issues/verbal cues  FIM - Radio producer Devices: Grab bars;Walker Toilet Transfers: 4-From toilet/BSC: Min A (steadying Pt. > 75%);4-To toilet/BSC: Min A (steadying Pt. > 75%)  FIM - Control and instrumentation engineer Devices: Walker;Arm rests Bed/Chair Transfer: 5: Chair or W/C > Bed: Supervision (verbal cues/safety issues);5: Bed >  Chair or W/C: Supervision (verbal cues/safety issues)  FIM - Locomotion: Wheelchair Distance: 40' Locomotion: Wheelchair:  0: Activity did not occur FIM - Locomotion: Ambulation Locomotion: Ambulation Assistive Devices: Walker - Rolling Ambulation/Gait Assistance: 4: Min guard;5: Supervision Locomotion: Ambulation: 5: Travels 150 ft or more with supervision/safety issues  Comprehension Comprehension Mode: Auditory Comprehension: 5-Understands basic 90% of the time/requires cueing < 10% of the time  Expression Expression Mode: Verbal Expression: 5-Expresses basic 90% of the time/requires cueing < 10% of the time.  Social Interaction Social Interaction: 5-Interacts appropriately 90% of the time - Needs monitoring or encouragement for participation or interaction.  Problem Solving Problem Solving: 5-Solves basic 90% of the time/requires cueing < 10% of the time  Memory Memory: 5-Recognizes or recalls 90% of the time/requires cueing < 10% of the time  Medical Problem List and Plan:  1. 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 DVT Prophylaxis/Anticoagulation: SCDs. Monitor for any signs of DVT  3. Pain Management: Ultram and Robaxin as needed. Monitor with increased mobility   -continue topamax for prophylaxis, 25mg  qhs  -ice to right hip, stretching--tolerable at this point 4. Neuropsych: This patient is capable of making decisions on her own behalf.  5. Hyponatremia:dc'ed mild fluid restriction  -sodium 139 today    6. GERD: protonix, maalox started  LOS (Days) 12 A FACE TO FACE EVALUATION WAS PERFORMED  Gabrielle Mester T 12/26/2013 7:48 AM

## 2013-12-26 NOTE — Progress Notes (Signed)
Social Work Patient ID: Claudia Perez, female   DOB: 06-15-1951, 63 y.o.   MRN: [id number]  Have reviewed team conference with pt and husband.  Of note, met yesterday with both to discuss their concerns about d/c as pt requires 24/7 supervision/ assist and family cannot provide this.  D/c plan changed to SNF with pt and husband agreement.  FL2 out to area facilities and will keep team posted on bed offers.  Orazio Weller, LCSW

## 2013-12-27 ENCOUNTER — Inpatient Hospital Stay (HOSPITAL_COMMUNITY): Payer: Medicare Other | Admitting: Speech Pathology

## 2013-12-27 ENCOUNTER — Inpatient Hospital Stay (HOSPITAL_COMMUNITY): Payer: Medicare Other

## 2013-12-27 ENCOUNTER — Encounter (HOSPITAL_COMMUNITY): Payer: Medicare Other

## 2013-12-27 DIAGNOSIS — R0609 Other forms of dyspnea: Secondary | ICD-10-CM | POA: Diagnosis not present

## 2013-12-27 NOTE — Progress Notes (Signed)
Social Work Patient ID: Claudia Perez, female   DOB: [birth year], 63 y.o.   MRN: [id number]  Have received SNF bed offer from [facility] who can admit pt tomorrow morning.  Pt and husband aware and accepting bed.  Plan to transfer via car.  Tx team aware.  Jayli Fogleman, LCSW

## 2013-12-27 NOTE — Progress Notes (Signed)
Occupational Therapy Session Note  Patient Details  Name: Claudia Perez MRN: [id number] Date of Birth: [birth year]  Today's Date: 12/27/2013 Time: 1300-1330 Time Calculation (min): 30 min  Short Term Goals: Week 1:  OT Short Term Goal 1 (Week 1): Pt will transfer to toilet with steady A  OT Short Term Goal 1 - Progress (Week 1): Met OT Short Term Goal 2 (Week 1): Pt will dress LB with min A  OT Short Term Goal 2 - Progress (Week 1): Met OT Short Term Goal 3 (Week 1): Pt will demonstrate dynamic standing balance with min A during functional tasks without UE support  OT Short Term Goal 3 - Progress (Week 1): Met OT Short Term Goal 4 (Week 1): Pt will ambulate with LRAD to obtain clothing for dressing with min  A for balance OT Short Term Goal 4 - Progress (Week 1): Met Week 2:  OT Short Term Goal 1 (Week 2): STG=LTG secondary to LOS  Skilled Therapeutic Interventions/Progress Updates:    Pt was in wc upon arrival. Pt ambulated with RW to therapy gym to participate in therapeutic activity.  Pt stood with RW to reach for items with BUE and then toss items towards target.  Pt then ambulated toward target with RW and utilized AE to pick up items and place in bucket.  Pt required min verbal cues for RW safety and manipulation.  Pt took multiple rest breaks and then ambulated with RW back to hospital room.  Pt had no c/o pain. Focus GM:LVXBOZ retrieving items from floor with AE and manipulation of RW.    Therapy Documentation Precautions:  Precautions Precautions: Fall Precaution Comments: Spoke with Dr Luiz Ochoa who states no Vestibular testing for a week from today to allow more healing time prior to performing any quick head movements or dangling head off of bed.   Restrictions Weight Bearing Restrictions: No   Therapy/Group: Individual Therapy  Purva Vessell 12/27/2013, 2:43 PM

## 2013-12-27 NOTE — Progress Notes (Signed)
Physical Therapy Discharge Summary  Patient Details  Name: Claudia Perez MRN: [id number] Date of Birth: Apr 01, 1951  Today's Date: 12/28/2013  Patient has met 10 of 10 long term goals due to improved activity tolerance, improved balance, improved postural control, increased strength, decreased pain, ability to compensate for deficits, improved awareness and improved coordination.  Patient to discharge at an ambulatory level Supervision. Patient's husband  unavailable to provide the recommended 24hr supervision at discharge.  Reasons goals not met: N/A  Recommendation:  Patient will benefit from ongoing skilled PT services in skilled nursing facility setting to continue to advance safe functional mobility, address ongoing impairments in decreased functional endurance, decreased strength, decreased balance, decreased postural control, decreased coordination, and minimize fall risk.  Equipment: No equipment provided  Reasons for discharge: treatment goals met and discharge from hospital  Patient/family agrees with progress made and goals achieved: Yes  PT Discharge Precautions/Restrictions Precautions Precautions: Fall Restrictions Weight Bearing Restrictions: No Vital Signs Therapy Vitals Temp: 97.2 F (36.2 C) Temp src: Oral Pulse Rate: 89 Resp: 18 BP: 111/70 mmHg Patient Position, if appropriate: Sitting Oxygen Therapy SpO2: 98 % O2 Device: None (Room air) Pain Pain Assessment Pain Assessment: No/denies pain Vision/Perception  Vision - History Baseline Vision: Wears glasses only for reading Patient Visual Report: No change from baseline Perception Perception: Within Functional Limits Praxis Praxis: Impaired Praxis Impairment Details: Motor planning Praxis-Other Comments: motor planning impairments at baseline. Difficult to differentiate residual impairments vs. baseline impairments.   Cognition Overall Cognitive Status: History of cognitive impairments - at  baseline Arousal/Alertness: Awake/alert Orientation Level: Oriented X4 Attention: Focused;Sustained;Selective;Alternating Focused Attention: Appears intact Sustained Attention: Appears intact Selective Attention: Appears intact Alternating Attention: Appears intact Memory Impairment: Decreased recall of new information Awareness: Appears intact Problem Solving: Impaired Problem Solving Impairment: Functional complex Safety/Judgment: Appears intact Comments: Pt with baseline cognitive deficits which makes it difficult to differentiate baseline cognitive deficits vs new cognitive impairments [employer] Scales of Cognitive Functioning: Purposeful/appropriate Sensation Sensation Light Touch: Appears Intact Proprioception: Appears Intact Coordination Gross Motor Movements are Fluid and Coordinated: No Coordination and Movement Description: Pt demonstrates gross ataxic movement, unable to determine residual impairments vs. baseline  Motor  Motor Motor: Ataxia;Abnormal tone;Hemiplegia Motor - Discharge Observations: mild impairments  Mobility Bed Mobility Bed Mobility: Supine to Sit;Sit to Supine Supine to Sit: 6: Modified independent (Device/Increase time) Sit to Supine: 6: Modified independent (Device/Increase time) Transfers Transfers: Yes Sit to Stand: 5: Supervision Sit to Stand Details: Verbal cues for precautions/safety;Verbal cues for technique Sit to Stand Details (indicate cue type and reason): Intermittent cues for pt to place hands on thighs to stand up to encourage anterior weight shift and prevent posterior LOB Stand to Sit: 5: Supervision Stand to Sit Details (indicate cue type and reason): Verbal cues for precautions/safety Stand Pivot Transfers: 5: Supervision Stand Pivot Transfer Details: Verbal cues for precautions/safety Stand Pivot Transfer Details (indicate cue type and reason): w/ RW Locomotion  Ambulation Ambulation: Yes Ambulation/Gait Assistance:  5: Supervision Ambulation Distance (Feet): 175 Feet Assistive device: Rolling walker Ambulation/Gait Assistance Details: Verbal cues for precautions/safety Gait Gait: Yes Gait Pattern: Impaired Gait Pattern: Decreased dorsiflexion - right;Decreased dorsiflexion - left;Decreased hip/knee flexion - left;Decreased hip/knee flexion - right;Ataxic;Lateral trunk lean to right;Wide base of support;Right foot flat;Left foot flat;Decreased step length - left;Decreased step length - right;Step-through pattern;Step-to pattern Gait velocity: decreased speed; RW encourages anterior weight shift; pt demonstrates consistent step-to pattern w/ L lead, unable to generate consistent reciprocal pattern 2/2 pain in R  hip, lacks extension; Pt w/ gait impairments at baseline, difficult to determine residual impairmetns vs. baseline Stairs / Additional Locomotion Stairs: Yes Stairs Assistance: 5: Supervision Stairs Assistance Details: Verbal cues for technique;Verbal cues for precautions/safety Stairs Assistance Details (indicate cue type and reason): Pt cued to place B UE in front of pt while descending to prevent posterior LOB Stair Management Technique: Two rails Number of Stairs: 16 Height of Stairs: 6.5 Wheelchair Mobility Wheelchair Mobility: No (Ambulation is pt's primary means of mobility)  Trunk/Postural Assessment  Cervical Assessment Cervical Assessment: Within Functional Limits Thoracic Assessment Thoracic Assessment: Within Functional Limits Lumbar Assessment Lumbar Assessment: Within Functional Limits Postural Control Postural Control: Deficits on evaluation Righting Reactions: delayed but improved overall, pt demonstrats posterior lean in static stance that often worsens during mobility leading to intermittent LOB Protective Responses: delayed, but improved overall  Balance Balance Balance Assessed: Yes Standardized Balance Assessment Standardized Balance Assessment: Berg Balance Test  (Increased from 10/56 to 34/56) Static Sitting Balance Static Sitting - Balance Support: No upper extremity supported;Feet supported;Bilateral upper extremity supported Static Sitting - Level of Assistance: 6: Modified independent (Device/Increase time) Dynamic Sitting Balance Dynamic Sitting - Balance Support: Left upper extremity supported;Right upper extremity supported Dynamic Sitting - Level of Assistance: 6: Modified independent (Device/Increase time) Static Standing Balance Static Standing - Balance Support: Left upper extremity supported;Right upper extremity supported;Bilateral upper extremity supported Static Standing - Level of Assistance: 5: Stand by assistance Dynamic Standing Balance Dynamic Standing - Balance Support: Left upper extremity supported;Right upper extremity supported;Bilateral upper extremity supported Dynamic Standing - Level of Assistance: 5: Stand by assistance Dynamic Standing - Balance Activities: Lateral lean/weight shifting;Forward lean/weight shifting;Reaching for objects;Reaching across midline Extremity Assessment      RLE Assessment RLE Assessment: Within Functional Limits RLE Strength RLE Overall Strength Comments: Pt demonstrates good functional strength, decreased muscle endurance LLE Assessment LLE Assessment: Within Functional Limits (Pt demonstrates good functional strength, decreased muscle endurance)  See FIM for current functional status  Gilmore Laroche 12/28/2013, 7:53 AM

## 2013-12-27 NOTE — Progress Notes (Signed)
Note reviewed and accurately reflects treatment session.   

## 2013-12-27 NOTE — Progress Notes (Signed)
Occupational Therapy Session Note  Patient Details  Name: Claudia Perez MRN: [id number] Date of Birth: [birth year]  Today's Date: 12/27/2013 Time: 0700-0800 Time Calculation (min): 60 min  Short Term Goals: Week 2:  OT Short Term Goal 1 (Week 2): STG=LTG secondary to LOS  Skilled Therapeutic Interventions/Progress Updates:    Pt was in bed upon arrival.  Pt transferred from side lying to sitting at EOB with stand by A and Min verbal cues.  Pt stood from EOB without AD and ambulated to bathroom with hand held min A.  Pt performed toileting routine and ambulated back to room to retrieve RW.  Pt utilized RW to collect clothing and personal items for bathing and then transferred from stand with RW <> shower chair with supervision.  Pt doffed UB/LB clothing and bathed UB/LB while seated in the shower chair.  Pt required min safety cues during shower/dressing activities.  Pt donned all UB/LB clothing while seated and then utilized RW to stand and ambulate to sink.  Pt stood at sink with RW to complete oral care routine.  Pt had no c/o pain.  Focus: Increasing independence in ADL performance, increasing safety awareness, increasing dynamic standing balance.    Therapy Documentation Precautions:  Precautions Precautions: Fall Precaution Comments: Spoke with Dr Luiz Ochoa who states no Vestibular testing for a week from today to allow more healing time prior to performing any quick head movements or dangling head off of bed.   Restrictions Weight Bearing Restrictions: No  See FIM for current functional status  Therapy/Group: Individual Therapy  Mumin Denomme 12/27/2013, 10:52 AM

## 2013-12-27 NOTE — Progress Notes (Signed)
Physical Therapy Session Note  Patient Details  Name: Jacki Couse MRN: [id number] Date of Birth: 22-Jan-1951  Today's Date: 12/27/2013 Time: Treatment Session 1: 5732-2025; Treatment Session 2: [id number] Time Calculation (min): Treatment Session 1: 5min; Treatment Session 2: 6min  Short Term Goals: Week 2:  PT Short Term Goal 1 (Week 2): STGs=LTGs due to anticipated LOS  Skilled Therapeutic Interventions/Progress Updates:  Treatment Session 1:  1:1. Pt received sitting in w/c, ready for therapy. Focus this session on stair negotiation, ambulation for functional endurance and dynamic standing balance. Pt able to negotiate up/down 16 steps w/ B rail and overall (S) using step-to pattern. Pt able to amb 275', 250' and 175' w/ RW, supervision in quiet and busy hallway environments on both carpet and hard level surfaces. Dynamic standing balance challenged by pt using B UE to arrange flowers and manage small watering can to water flowers while amb short distances w/out RW, req min A overall. Pt sitting in recliner at end of session w/ all needs in reach and husband in room.   Treatment Session 2:  1:1. Pt received sitting in recliner, ready for therapy. Pt w/ good tolerance to use of NuStep, level 4x10 min, w/ use of B UE/LE as well as amb 200', 45' and 150' w/ RW to target functional strength/endurance. Pt sitting in w/c at end of session w/ all needs in reach.   Therapy Documentation Precautions:  Precautions Precautions: Fall Precaution Comments: Spoke with Dr Luiz Ochoa who states no Vestibular testing for a week from today to allow more healing time prior to performing any quick head movements or dangling head off of bed.   Restrictions Weight Bearing Restrictions: No Pain: Pain Assessment Pain Assessment: No/denies pain  See FIM for current functional status  Therapy/Group: Individual Therapy  Gilmore Laroche 12/27/2013, 9:26 AM

## 2013-12-27 NOTE — Progress Notes (Signed)
Subjective/Complaints: No new issues. Had a good night A 12 point review of systems has been performed and if not noted above is otherwise negative.   Objective: Vital Signs: Blood pressure 111/71, pulse 68, temperature 97.4 F (36.3 C), temperature source Oral, resp. rate 18, height 5\' 1"  (1.549 m), weight 52.073 kg (114 lb 12.8 oz), SpO2 98.00%. No results found. No results found for this basename: WBC, HGB, HCT, PLT,  in the last 72 hours  Recent Labs  12/25/13 0606  NA 139  K 4.4  CL 103  GLUCOSE 104*  BUN 13  CREATININE 0.76  CALCIUM 9.1   CBG (last 3)  No results found for this basename: GLUCAP,  in the last 72 hours  Wt Readings from Last 3 Encounters:  12/21/13 52.073 kg (114 lb 12.8 oz)  12/10/13 49.9 kg (110 lb 0.2 oz)  03/27/13 54.069 kg (119 lb 3.2 oz)    Physical Exam:  Constitutional: She is oriented to person, place, and time. She appears well-developed and well-nourished. No distress HENT:  Head: Normocephalic. No tenderness over frontal sinuses Eyes: Conjunctivae are normal. Pupils are equal, round, and reactive to light.  Neck: Normal range of motion.  Cardiovascular: Normal rate and regular rhythm.  Respiratory: Effort normal and breath sounds normal. No respiratory distress. She has no wheezes.  GI: Soft. Bowel sounds are normal. She exhibits no distension. There is no tenderness.  Musculoskeletal: She exhibits no edema. Right hip/troch moderately tender to palpation and ROM Neurological: She is alert and oriented to person, place, and time.  Still with halting speech/accent--clearer. Follows basic commands without difficulty. Good recall with fair insight and awareness. Dysmetria better. No nystagmus. Decreased fine motor bilateral upper but somewhat improving. Strength grossly  4-/5 with more weakness in the left upper.   Psychiatric: She has a normal mood and affect. Her behavior is normal     Assessment/Plan: 1. Functional deficits secondary  to traumatic brain injury with skull fx which require 3+ hours per day of interdisciplinary therapy in a comprehensive inpatient rehab setting. Physiatrist is providing close team supervision and 24 hour management of active medical problems listed below. Physiatrist and rehab team continue to assess barriers to discharge/monitor patient progress toward functional and medical goals.  SNF pending   FIM: FIM - Bathing Bathing Steps Patient Completed: Chest;Buttocks;Right upper leg;Right Arm;Left Arm;Left upper leg;Right lower leg (including foot);Abdomen;Front perineal area;Left lower leg (including foot) Bathing: 5: Supervision: Safety issues/verbal cues  FIM - Upper Body Dressing/Undressing Upper body dressing/undressing steps patient completed: Thread/unthread right sleeve of pullover shirt/dresss;Put head through opening of pull over shirt/dress;Thread/unthread left sleeve of pullover shirt/dress;Pull shirt over trunk Upper body dressing/undressing: 5: Supervision: Safety issues/verbal cues FIM - Lower Body Dressing/Undressing Lower body dressing/undressing steps patient completed: Pull pants up/down;Thread/unthread left pants leg;Thread/unthread right pants leg;Don/Doff right sock;Don/Doff left sock Lower body dressing/undressing: 5: Supervision: Safety issues/verbal cues  FIM - Toileting Toileting steps completed by patient: Adjust clothing prior to toileting;Performs perineal hygiene;Adjust clothing after toileting Toileting Assistive Devices: Grab bar or rail for support Toileting: 5: Supervision: Safety issues/verbal cues  FIM - Radio producer Devices: Insurance account manager Transfers: 5-To toilet/BSC: Supervision (verbal cues/safety issues);5-From toilet/BSC: Supervision (verbal cues/safety issues)  FIM - Control and instrumentation engineer Devices: Copy: 5: Supine > Sit: Supervision (verbal cues/safety issues);5: Sit >  Supine: Supervision (verbal cues/safety issues);5: Chair or W/C > Bed: Supervision (verbal cues/safety issues);5: Bed > Chair or W/C: Supervision (verbal cues/safety issues)  FIM -  Locomotion: Wheelchair Distance: 40' Locomotion: Wheelchair: 0: Activity did not occur FIM - Locomotion: Ambulation Locomotion: Ambulation Assistive Devices: Administrator Ambulation/Gait Assistance: 5: Supervision Locomotion: Ambulation: 5: Travels 150 ft or more with supervision/safety issues  Comprehension Comprehension Mode: Auditory Comprehension: 5-Understands basic 90% of the time/requires cueing < 10% of the time  Expression Expression Mode: Verbal Expression: 5-Expresses basic 90% of the time/requires cueing < 10% of the time.  Social Interaction Social Interaction: 5-Interacts appropriately 90% of the time - Needs monitoring or encouragement for participation or interaction.  Problem Solving Problem Solving: 5-Solves complex 90% of the time/cues < 10% of the time  Memory Memory: 5-Recognizes or recalls 90% of the time/requires cueing < 10% of the time  Medical Problem List and Plan:  1. 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 DVT Prophylaxis/Anticoagulation: SCDs. Monitor for any signs of DVT  3. Pain Management: Ultram and Robaxin as needed. Monitor with increased mobility   -continue topamax for prophylaxis, 25mg  qhs  -ice to right hip, stretching--tolerable at this point 4. Neuropsych: This patient is capable of making decisions on her own behalf.  5. Hyponatremia:dc'ed mild fluid restriction  -sodium 139     6. GERD: protonix, maalox started  LOS (Days) 13 A FACE TO FACE EVALUATION WAS PERFORMED  Azekiel Cremer T 12/27/2013 8:14 AM

## 2013-12-27 NOTE — Progress Notes (Signed)
Speech Language Pathology Daily Session Note  Patient Details  Name: Claudia Perez MRN: [id number] Date of Birth: 1951-05-06  Today's Date: 12/27/2013 Time: 1020-1045 Time Calculation (min): 25 min  Short Term Goals: Week 2: SLP Short Term Goal 1 (Week 2): Pt and husband will verbalize/demonstrate understanding of memory compensatory strategies with Mod I.   Skilled Therapeutic Interventions: Treatment focus on cognitive goals. SLP facilitated session by providing supervision question cues for recall of rules to previously learned task and for mildly complex problem solving and organization with task.  Continue plan of care.    FIM:  Comprehension Comprehension Mode: Auditory Comprehension: 5-Understands basic 90% of the time/requires cueing < 10% of the time Expression Expression Mode: Verbal Expression: 5-Expresses basic 90% of the time/requires cueing < 10% of the time. Social Interaction Social Interaction: 5-Interacts appropriately 90% of the time - Needs monitoring or encouragement for participation or interaction. Problem Solving Problem Solving: 5-Solves complex 90% of the time/cues < 10% of the time Memory Memory: 5-Recognizes or recalls 90% of the time/requires cueing < 10% of the time  Pain Pain Assessment Pain Assessment: No/denies pain  Therapy/Group: Individual Therapy  Lizandro Spellman 12/27/2013, 3:36 PM

## 2013-12-28 ENCOUNTER — Encounter (HOSPITAL_COMMUNITY): Payer: Medicare Other

## 2013-12-28 DIAGNOSIS — S069XAA Unspecified intracranial injury with loss of consciousness status unknown, initial encounter: Secondary | ICD-10-CM

## 2013-12-28 DIAGNOSIS — S069X9A Unspecified intracranial injury with loss of consciousness of unspecified duration, initial encounter: Secondary | ICD-10-CM

## 2013-12-28 DIAGNOSIS — K219 Gastro-esophageal reflux disease without esophagitis: Secondary | ICD-10-CM

## 2013-12-28 NOTE — Progress Notes (Signed)
Occupational Therapy Discharge Summary  Patient Details  Name: Claudia Perez MRN: [id number] Date of Birth: 01/17/1951  Today's Date: 12/28/2013  Patient has met 10 of 10 long term goals due to improved activity tolerance, improved balance, improved postural control, ability to compensate for deficits, improved awareness, improved coordination, improved motor control, improved strength, decreased pain and improved coordination.  Pt made steady gains with therapy and was able to meet all goals. Pt educated on safety and use of RW and AE for home.  Pt demonstrated ataxic movements which therefore require pt more time to complete activities. Pt continues to require min verbal cues for safety during functional ambulation, sit<>stand to and from wc and when motor planning.   Patient's care partner unable to provide 24/7 supervision therefore, she is discharging to SNF for continued rehabilitation.  Patient to discharge at overall Supervision level.    Recommendation:  Patient will benefit from ongoing skilled OT services in skilled nursing facility setting to continue to advance functional skills in the area of BADL, iADL and Reduce care partner burden.  Equipment: No equipment provided  Reasons for discharge: treatment goals met and discharge from hospital  Patient/family agrees with progress made and goals achieved: Yes  OT Discharge Vision/Perception  Vision - Assessment Eye Alignment: Within Functional Limits Vision Assessment: Vision not tested Ocular Range of Motion: Within Functional Limits Tracking/Visual Pursuits: Able to track stimulus in all quads without difficulty  Cognition Overall Cognitive Status: Within Functional Limits for tasks assessed Arousal/Alertness: Awake/alert Orientation Level: Oriented X4 Attention: Selective Focused Attention: Appears intact Sustained Attention: Appears intact Selective Attention: Appears intact Memory: Appears intact MAwareness: Appears  intact Problem Solving: Impaired Problem Solving Impairment: Functional complex Safety/Judgment: Impaired Comments: Pt with baseline cognitive deficits which makes it difficult to differentiate baseline cognitive deficits vs new cognitive impairments [employer] Scales of Cognitive Functioning: Purposeful/appropriate Sensation Sensation Light Touch: Appears Intact Stereognosis: Appears Intact Hot/Cold: Appears Intact Proprioception: Appears Intact Coordination Gross Motor Movements are Fluid and Coordinated: No Fine Motor Movements are Fluid and Coordinated: No   Trunk/Postural Assessment  Cervical Assessment Cervical Assessment: Within Functional Limits Thoracic Assessment Thoracic Assessment: Within Functional Limits Lumbar Assessment Lumbar Assessment: Within Functional Limits Postural Control Postural Control: Within Functional Limits Balance Balance Static Sitting Balance Static Sitting - Balance Support: Bilateral upper extremity supported;Feet supported Static Sitting - Level of Assistance: 7: Independent Dynamic Sitting Balance Dynamic Sitting - Balance Support: Bilateral upper extremity supported;Feet supported Dynamic Sitting - Level of Assistance: 7: Independent Extremity/Trunk Assessment RUE Assessment RUE Assessment: Within Functional Limits LUE Assessment LUE Assessment: Within Functional Limits  See FIM for current functional status  Shipley,Sheila 12/28/2013, 9:50 AM

## 2013-12-28 NOTE — Progress Notes (Signed)
Speech Language Pathology Discharge Summary  Patient Details  Name: Claudia Perez MRN: [id number] Date of Birth: Aug 19, 1951  Today's Date: 12/28/2013  Patient has met 3 of 3 long term goals.  Patient to discharge at overall Supervision level.   Reasons goals not met: N/A   Clinical Impression/Discharge Summary: Pt has made functional gains and has met 3 of 3 LTG's this admission due to improved recall of new information, functional problem solving, awareness and overall speech intelligibility. Pt will discharge at a supervision level, however, pt's husband unable to provide 24 hour supervision at this time, therefore, pt will discharge to SNF. Recommend continued SLP services at next venue of care to maximize cognitive function and overall functional independence.   Care Partner:  Caregiver Able to Provide Assistance: No  Type of Caregiver Assistance: Physical;Cognitive  Recommendation:  Skilled Nursing facility  Rationale for SLP Follow Up: Maximize cognitive function and independence;Reduce caregiver burden   Equipment: N/A   Reasons for discharge: Treatment goals met;Discharged from hospital   Patient/Family Agrees with Progress Made and Goals Achieved: Yes   See FIM for current functional status  Spero Gunnels 12/28/2013, 7:29 AM

## 2013-12-28 NOTE — Progress Notes (Signed)
Subjective/Complaints: Still come cough. No reflux. Overall feeling well. Up with OT getting bathed this am. A 12 point review of systems has been performed and if not noted above is otherwise negative.   Objective: Vital Signs: Blood pressure 111/70, pulse 89, temperature 97.2 F (36.2 C), temperature source Oral, resp. rate 18, height 5\' 1"  (1.549 m), weight 50.531 kg (111 lb 6.4 oz), SpO2 98.00%. Dg Chest 2 View  12/27/2013   CLINICAL DATA:  Dyspnea  EXAM: CHEST  2 VIEW  COMPARISON:  CT T SPINE W/O CM dated 12/11/2013; DG CHEST 2 VIEW dated 12/28/2007  FINDINGS: The lungs are mildly hyperinflated but clear. The cardiac silhouette is normal in size. The pulmonary vascularity is not engorged. There is no pleural effusion. The bony thorax exhibits no acute abnormality.  IMPRESSION: There is no evidence of active cardiopulmonary disease.   Electronically Signed   By: David  Martinique   On: 12/27/2013 18:00   No results found for this basename: WBC, HGB, HCT, PLT,  in the last 72 hours No results found for this basename: NA, K, CL, CO, GLUCOSE, BUN, CREATININE, CALCIUM,  in the last 72 hours CBG (last 3)  No results found for this basename: GLUCAP,  in the last 72 hours  Wt Readings from Last 3 Encounters:  12/27/13 50.531 kg (111 lb 6.4 oz)  12/10/13 49.9 kg (110 lb 0.2 oz)  03/27/13 54.069 kg (119 lb 3.2 oz)    Physical Exam:  Constitutional: She is oriented to person, place, and time. She appears well-developed and well-nourished. No distress HENT:  Head: Normocephalic. No tenderness over frontal sinuses Eyes: Conjunctivae are normal. Pupils are equal, round, and reactive to light.  Neck: Normal range of motion.  Cardiovascular: Normal rate and regular rhythm.  Respiratory: Effort normal and breath sounds normal. No respiratory distress. She has no wheezes.  GI: Soft. Bowel sounds are normal. She exhibits no distension. There is no tenderness.  Musculoskeletal: She exhibits no edema.  Right hip/troch moderately tender to palpation and ROM Neurological: She is alert and oriented to person, place, and time.  Still with halting speech/accent--clearer. Follows basic commands without difficulty. Good recall with fair insight and awareness. Dysmetria better. No nystagmus. Decreased fine motor bilateral upper but somewhat improving. Strength grossly  4-/5 with more weakness in the left upper.   Psychiatric: She has a normal mood and affect. Her behavior is normal     Assessment/Plan: 1. 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 Physiatrist is providing close team supervision and 24 hour management of active medical problems listed below. Physiatrist and rehab team continue to assess barriers to discharge/monitor patient progress toward functional and medical goals.  SNF pending tomorrow. Extended one more day to allow husband to take her to the facility tomorrow--he's teaching all day today.   FIM: FIM - Bathing Bathing Steps Patient Completed: Chest;Buttocks;Right Arm;Right upper leg;Left Arm;Left upper leg;Abdomen;Right lower leg (including foot);Front perineal area;Left lower leg (including foot) Bathing: 5: Supervision: Safety issues/verbal cues  FIM - Upper Body Dressing/Undressing Upper body dressing/undressing steps patient completed: Thread/unthread left sleeve of pullover shirt/dress;Put head through opening of pull over shirt/dress;Thread/unthread right sleeve of pullover shirt/dresss;Pull shirt over trunk Upper body dressing/undressing: 5: Supervision: Safety issues/verbal cues FIM - Lower Body Dressing/Undressing Lower body dressing/undressing steps patient completed: Thread/unthread left pants leg;Pull pants up/down;Thread/unthread right pants leg;Don/Doff right sock;Don/Doff left sock Lower body dressing/undressing:  5: Supervision:  Safety issues/verbal cues  FIM - Toileting Toileting steps completed by patient: Adjust clothing prior to toileting;Performs perineal hygiene;Adjust clothing after toileting Toileting Assistive Devices: Grab bar or rail for support Toileting: 6: Assistive device: No helper  FIM - Radio producer Devices: Grab bars Toilet Transfers: 4-To toilet/BSC: Min A (steadying Pt. > 75%);4-From toilet/BSC: Min A (steadying Pt. > 75%);6-Assistive device: No helper  FIM - Bed/Chair Transfer Bed/Chair Transfer Assistive Devices: Copy: 5: Chair or W/C > Bed: Supervision (verbal cues/safety issues);5: Bed > Chair or W/C: Supervision (verbal cues/safety issues)  FIM - Locomotion: Wheelchair Distance: 40' Locomotion: Wheelchair: 0: Activity did not occur FIM - Locomotion: Ambulation Locomotion: Ambulation Assistive Devices: Administrator Ambulation/Gait Assistance: 5: Supervision Locomotion: Ambulation: 5: Travels 150 ft or more with supervision/safety issues  Comprehension Comprehension Mode: Auditory Comprehension: 5-Understands basic 90% of the time/requires cueing < 10% of the time  Expression Expression Mode: Verbal Expression: 5-Expresses basic 90% of the time/requires cueing < 10% of the time.  Social Interaction Social Interaction: 5-Interacts appropriately 90% of the time - Needs monitoring or encouragement for participation or interaction.  Problem Solving Problem Solving: 5-Solves complex 90% of the time/cues < 10% of the time  Memory Memory: 5-Recognizes or recalls 90% of the time/requires cueing < 10% of the time  Medical Problem List and Plan:  1. 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 DVT Prophylaxis/Anticoagulation: SCDs. Monitor for any signs of DVT  3. Pain Management: Ultram and Robaxin as needed. Monitor with increased mobility   -continue topamax for prophylaxis, 25mg  qhs    4.  Neuropsych: This patient is capable of making decisions on her own behalf.  5. Hyponatremia:dc'ed mild fluid restriction  -sodium 139     6. GERD: protonix, maalox started--some improvement  LOS (Days) 14 A FACE TO FACE EVALUATION WAS PERFORMED  SWARTZ,ZACHARY T 12/28/2013 7:51 AM

## 2013-12-28 NOTE — Progress Notes (Signed)
Note reviewed and accurately reflects treatment session.   

## 2013-12-28 NOTE — Progress Notes (Signed)
Social Work Patient ID: Claudia Perez, female   DOB: 07-26-1951, 63 y.o.   MRN: [id number]   Pt's transfer to [facility] delayed by one day per husband's request.  Husband contacted me yesterday afternoon very distressed and asking for this delay as he is teaching all day today.  Discussed with Larina Bras, Director of CIR, who approved husband's request.  Have discussed with [facility] and they do feel they will still have available bed tomorrow.  Damaree Sargent, LCSW

## 2013-12-28 NOTE — Progress Notes (Signed)
Occupational Therapy Session Note  Patient Details  Name: Claudia Perez MRN: [id number] Date of Birth: 10/28/1951  Today's Date: 12/28/2013 Time: 0700-0800 Time Calculation (min): 60 min  Short Term Goals: Week 1:  OT Short Term Goal 1 (Week 1): Pt will transfer to toilet with steady A  OT Short Term Goal 1 - Progress (Week 1): Met OT Short Term Goal 2 (Week 1): Pt will dress LB with min A  OT Short Term Goal 2 - Progress (Week 1): Met OT Short Term Goal 3 (Week 1): Pt will demonstrate dynamic standing balance with min A during functional tasks without UE support  OT Short Term Goal 3 - Progress (Week 1): Met OT Short Term Goal 4 (Week 1): Pt will ambulate with LRAD to obtain clothing for dressing with min  A for balance OT Short Term Goal 4 - Progress (Week 1): Met  Skilled Therapeutic Interventions/Progress Updates:    Pt was in bed upon arrival. Pt was able to go from supine to sitting with min verbal cues.  Pt used RW to ambulate to bathroom and utilized grab bars to perform toileting routine.  Pt ambulated with RW to collect clothing and personal items for bathing activity.  Pt required min verbal cues for safety when utilizing AE to choose clothing, and demonstrated carryover of newly learned techniques immediately.  Pt transferred from standing with RW to shower chair and completed UB/LB undressing.  Pt bathed UB/LB while seated on shower chair with min verbal cues for safety.  Pt donned UB/LB clothing while seated and transferred with RW to stand at sink level and complete oral care routine. Pt had no c/o pain.  Focus on: increasing independence in ADL performance, safety awareness with RW and functional ambulation.    Therapy Documentation Precautions:  Precautions Precautions: Fall Precaution Comments: Spoke with Dr Luiz Ochoa who states no Vestibular testing for a week from today to allow more healing time prior to performing any quick head movements or dangling head off of bed.    Restrictions Weight Bearing Restrictions: No  Therapy/Group: Individual Therapy  Draysen Weygandt 12/28/2013, 9:12 AM

## 2013-12-29 DIAGNOSIS — E7800 Pure hypercholesterolemia, unspecified: Secondary | ICD-10-CM | POA: Diagnosis not present

## 2013-12-29 DIAGNOSIS — E559 Vitamin D deficiency, unspecified: Secondary | ICD-10-CM | POA: Diagnosis not present

## 2013-12-29 DIAGNOSIS — L738 Other specified follicular disorders: Secondary | ICD-10-CM | POA: Diagnosis not present

## 2013-12-29 DIAGNOSIS — K117 Disturbances of salivary secretion: Secondary | ICD-10-CM | POA: Diagnosis not present

## 2013-12-29 DIAGNOSIS — S069X9A Unspecified intracranial injury with loss of consciousness of unspecified duration, initial encounter: Secondary | ICD-10-CM | POA: Diagnosis not present

## 2013-12-29 DIAGNOSIS — Z5189 Encounter for other specified aftercare: Secondary | ICD-10-CM | POA: Diagnosis not present

## 2013-12-29 DIAGNOSIS — R269 Unspecified abnormalities of gait and mobility: Secondary | ICD-10-CM | POA: Diagnosis not present

## 2013-12-29 DIAGNOSIS — S069XAA Unspecified intracranial injury with loss of consciousness status unknown, initial encounter: Secondary | ICD-10-CM | POA: Diagnosis not present

## 2013-12-29 DIAGNOSIS — S0280XA Fracture of other specified skull and facial bones, unspecified side, initial encounter for closed fracture: Secondary | ICD-10-CM | POA: Diagnosis not present

## 2013-12-29 DIAGNOSIS — G40909 Epilepsy, unspecified, not intractable, without status epilepticus: Secondary | ICD-10-CM | POA: Diagnosis not present

## 2013-12-29 DIAGNOSIS — R279 Unspecified lack of coordination: Secondary | ICD-10-CM | POA: Diagnosis not present

## 2013-12-29 DIAGNOSIS — E0590 Thyrotoxicosis, unspecified without thyrotoxic crisis or storm: Secondary | ICD-10-CM | POA: Diagnosis not present

## 2013-12-29 DIAGNOSIS — R488 Other symbolic dysfunctions: Secondary | ICD-10-CM | POA: Diagnosis not present

## 2013-12-29 DIAGNOSIS — G259 Extrapyramidal and movement disorder, unspecified: Secondary | ICD-10-CM | POA: Diagnosis not present

## 2013-12-29 DIAGNOSIS — I6200 Nontraumatic subdural hemorrhage, unspecified: Secondary | ICD-10-CM | POA: Diagnosis not present

## 2013-12-29 DIAGNOSIS — K219 Gastro-esophageal reflux disease without esophagitis: Secondary | ICD-10-CM | POA: Diagnosis not present

## 2013-12-29 DIAGNOSIS — E871 Hypo-osmolality and hyponatremia: Secondary | ICD-10-CM | POA: Diagnosis not present

## 2013-12-29 DIAGNOSIS — R499 Unspecified voice and resonance disorder: Secondary | ICD-10-CM | POA: Diagnosis not present

## 2013-12-29 DIAGNOSIS — Z9181 History of falling: Secondary | ICD-10-CM | POA: Diagnosis not present

## 2013-12-29 NOTE — Progress Notes (Signed)
Subjective/Complaints: Still come cough. No reflux. Overall feeling well. Up with OT getting bathed this am. A 12 point review of systems has been performed and if not noted above is otherwise negative.   Objective: Vital Signs: Blood pressure 114/74, pulse 78, temperature 97.8 F (36.6 C), temperature source Oral, resp. rate 18, height 5\' 1"  (1.549 m), weight 50.531 kg (111 lb 6.4 oz), SpO2 96.00%. Dg Chest 2 View  12/27/2013   CLINICAL DATA:  Dyspnea  EXAM: CHEST  2 VIEW  COMPARISON:  CT T SPINE W/O CM dated 12/11/2013; DG CHEST 2 VIEW dated 12/28/2007  FINDINGS: The lungs are mildly hyperinflated but clear. The cardiac silhouette is normal in size. The pulmonary vascularity is not engorged. There is no pleural effusion. The bony thorax exhibits no acute abnormality.  IMPRESSION: There is no evidence of active cardiopulmonary disease.   Electronically Signed   By: David  Martinique   On: 12/27/2013 18:00   No results found for this basename: WBC, HGB, HCT, PLT,  in the last 72 hours No results found for this basename: NA, K, CL, CO, GLUCOSE, BUN, CREATININE, CALCIUM,  in the last 72 hours CBG (last 3)  No results found for this basename: GLUCAP,  in the last 72 hours  Wt Readings from Last 3 Encounters:  12/27/13 50.531 kg (111 lb 6.4 oz)  12/10/13 49.9 kg (110 lb 0.2 oz)  03/27/13 54.069 kg (119 lb 3.2 oz)    Physical Exam:  Constitutional: She is oriented to person, place, and time. She appears well-developed and well-nourished. No distress HENT:  Head: Normocephalic. No tenderness over frontal sinuses Eyes: Conjunctivae are normal. Pupils are equal, round, and reactive to light.  Neck: Normal range of motion.  Cardiovascular: Normal rate and regular rhythm.  Respiratory: Effort normal and breath sounds normal. No respiratory distress. She has no wheezes.  GI: Soft. Bowel sounds are normal. She exhibits no distension. There is no tenderness.  Musculoskeletal: She exhibits no edema.  Right hip/troch moderately tender to palpation and ROM Neurological: She is alert and oriented to person, place, and time.  Still with halting speech/accent--clearer. Follows basic commands without difficulty. Good recall with fair insight and awareness. Dysmetria better. No nystagmus. Decreased fine motor bilateral upper but somewhat improving. Strength grossly  4-/5 with more weakness in the left upper.   Psychiatric: She has a normal mood and affect. Her behavior is normal     Assessment/Plan: 1. 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 Physiatrist is providing close team supervision and 24 hour management of active medical problems listed below. Physiatrist and rehab team continue to assess barriers to discharge/monitor patient progress toward functional and medical goals.  SNF today.    FIM: FIM - Bathing Bathing Steps Patient Completed: Chest;Right Arm;Left Arm;Abdomen;Front perineal area;Left lower leg (including foot);Right lower leg (including foot);Left upper leg;Right upper leg;Buttocks Bathing: 5: Supervision: Safety issues/verbal cues  FIM - Upper Body Dressing/Undressing Upper body dressing/undressing steps patient completed: Thread/unthread left sleeve of pullover shirt/dress;Put head through opening of pull over shirt/dress;Thread/unthread right sleeve of pullover shirt/dresss;Pull shirt over trunk Upper body dressing/undressing: 6: More than reasonable amount of time FIM - Lower Body Dressing/Undressing Lower body dressing/undressing steps patient completed: Thread/unthread left pants leg;Pull pants up/down;Thread/unthread right pants leg;Don/Doff left sock;Don/Doff right sock Lower body dressing/undressing: 5: Supervision: Safety issues/verbal cues  FIM - Toileting Toileting steps completed by patient: Adjust clothing  prior to  toileting;Performs perineal hygiene;Adjust clothing after toileting Toileting Assistive Devices: Grab bar or rail for support Toileting: 6: Assistive device: No helper  FIM - Radio producer Devices: Grab bars;Walker Toilet Transfers: 6-Assistive device: No helper  FIM - Control and instrumentation engineer Devices: Copy: 5: Supine > Sit: Supervision (verbal cues/safety issues)  FIM - Locomotion: Wheelchair Distance: 40' Locomotion: Wheelchair: 0: Activity did not occur FIM - Locomotion: Ambulation Locomotion: Ambulation Assistive Devices: Administrator Ambulation/Gait Assistance: 5: Supervision Locomotion: Ambulation: 5: Travels 150 ft or more with supervision/safety issues  Comprehension Comprehension Mode: Auditory Comprehension: 5-Understands complex 90% of the time/Cues < 10% of the time  Expression Expression Mode: Verbal Expression: 5-Expresses basic 90% of the time/requires cueing < 10% of the time.  Social Interaction Social Interaction: 5-Interacts appropriately 90% of the time - Needs monitoring or encouragement for participation or interaction.  Problem Solving Problem Solving: 5-Solves complex 90% of the time/cues < 10% of the time  Memory Memory: 5-Recognizes or recalls 90% of the time/requires cueing < 10% of the time  Medical Problem List and Plan:  1. 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 DVT Prophylaxis/Anticoagulation: SCDs. Monitor for any signs of DVT  3. Pain Management: Ultram and Robaxin as needed. Monitor with increased mobility   -continue topamax for prophylaxis, 25mg  qhs    4. Neuropsych: This patient is capable of making decisions on her own behalf.  5. Hyponatremia:dc'ed mild fluid restriction  -sodium 139     6. GERD: protonix, maalox started--some improvement  LOS (Days) 15 A FACE TO FACE EVALUATION WAS PERFORMED  Claudia Perez  T 12/29/2013 7:51 AM

## 2013-12-29 NOTE — Progress Notes (Signed)
Patient discharged to [facility]. Discharge packet given to spouse. Report called to Jackson Latino, RN at [facility].

## 2013-12-29 NOTE — Progress Notes (Signed)
Social Work  Discharge Note  The overall goal for the admission was met for:   Discharge location: No - plan changed to SNF day prior to d/c  Length of Stay: No - delay of d/c due to change in d/c plan to SNF  Discharge activity level: Yes - supervision  Home/community participation: Yes  Services provided included: MD, RD, PT, OT, SLP, RN, TR, Pharmacy and SW  Financial Services: Medicare and Private Insurance: [place]  Follow-up services arranged: Other: SNF @ [facility]/ [facility]  Comments (or additional information):  Patient/Family verbalized understanding of follow-up arrangements: Yes  Individual responsible for coordination of the follow-up plan: patient/ husband  Confirmed correct DME delivered: NA    Claudia Perez

## 2013-12-31 DIAGNOSIS — E0590 Thyrotoxicosis, unspecified without thyrotoxic crisis or storm: Secondary | ICD-10-CM | POA: Diagnosis not present

## 2013-12-31 DIAGNOSIS — Z9181 History of falling: Secondary | ICD-10-CM | POA: Diagnosis not present

## 2013-12-31 DIAGNOSIS — K117 Disturbances of salivary secretion: Secondary | ICD-10-CM | POA: Diagnosis not present

## 2013-12-31 DIAGNOSIS — E7800 Pure hypercholesterolemia, unspecified: Secondary | ICD-10-CM | POA: Diagnosis not present

## 2013-12-31 DIAGNOSIS — S069X9A Unspecified intracranial injury with loss of consciousness of unspecified duration, initial encounter: Secondary | ICD-10-CM | POA: Diagnosis not present

## 2013-12-31 DIAGNOSIS — K219 Gastro-esophageal reflux disease without esophagitis: Secondary | ICD-10-CM | POA: Diagnosis not present

## 2013-12-31 DIAGNOSIS — G40909 Epilepsy, unspecified, not intractable, without status epilepticus: Secondary | ICD-10-CM | POA: Diagnosis not present

## 2013-12-31 DIAGNOSIS — Z5189 Encounter for other specified aftercare: Secondary | ICD-10-CM | POA: Diagnosis not present

## 2013-12-31 DIAGNOSIS — G259 Extrapyramidal and movement disorder, unspecified: Secondary | ICD-10-CM | POA: Diagnosis not present

## 2013-12-31 DIAGNOSIS — L738 Other specified follicular disorders: Secondary | ICD-10-CM | POA: Diagnosis not present

## 2013-12-31 DIAGNOSIS — R279 Unspecified lack of coordination: Secondary | ICD-10-CM | POA: Diagnosis not present

## 2013-12-31 DIAGNOSIS — R488 Other symbolic dysfunctions: Secondary | ICD-10-CM | POA: Diagnosis not present

## 2013-12-31 DIAGNOSIS — S0280XA Fracture of other specified skull and facial bones, unspecified side, initial encounter for closed fracture: Secondary | ICD-10-CM | POA: Diagnosis not present

## 2013-12-31 DIAGNOSIS — R269 Unspecified abnormalities of gait and mobility: Secondary | ICD-10-CM | POA: Diagnosis not present

## 2013-12-31 DIAGNOSIS — R499 Unspecified voice and resonance disorder: Secondary | ICD-10-CM | POA: Diagnosis not present

## 2013-12-31 DIAGNOSIS — E871 Hypo-osmolality and hyponatremia: Secondary | ICD-10-CM | POA: Diagnosis not present

## 2013-12-31 DIAGNOSIS — E559 Vitamin D deficiency, unspecified: Secondary | ICD-10-CM | POA: Diagnosis not present

## 2014-01-03 DIAGNOSIS — L738 Other specified follicular disorders: Secondary | ICD-10-CM | POA: Diagnosis not present

## 2014-01-03 DIAGNOSIS — K117 Disturbances of salivary secretion: Secondary | ICD-10-CM | POA: Diagnosis not present

## 2014-01-14 ENCOUNTER — Encounter: Payer: Self-pay | Admitting: *Deleted

## 2014-01-31 ENCOUNTER — Encounter: Payer: Medicare Other | Admitting: Physical Medicine & Rehabilitation

## 2014-01-31 DIAGNOSIS — S069XAA Unspecified intracranial injury with loss of consciousness status unknown, initial encounter: Secondary | ICD-10-CM | POA: Diagnosis not present

## 2014-01-31 DIAGNOSIS — S069X9A Unspecified intracranial injury with loss of consciousness of unspecified duration, initial encounter: Secondary | ICD-10-CM | POA: Diagnosis not present

## 2014-01-31 DIAGNOSIS — G259 Extrapyramidal and movement disorder, unspecified: Secondary | ICD-10-CM | POA: Diagnosis not present

## 2014-02-01 ENCOUNTER — Encounter: Payer: Self-pay | Admitting: Neurology

## 2014-02-02 ENCOUNTER — Encounter: Payer: Self-pay | Admitting: Neurology

## 2014-02-12 DIAGNOSIS — S55909S Unspecified injury of unspecified blood vessel at forearm level, unspecified arm, sequela: Secondary | ICD-10-CM | POA: Diagnosis not present

## 2014-02-12 DIAGNOSIS — S0291XS Unspecified fracture of skull, sequela: Secondary | ICD-10-CM | POA: Diagnosis not present

## 2014-02-12 DIAGNOSIS — S85909S Unspecified injury of unspecified blood vessel at lower leg level, unspecified leg, sequela: Secondary | ICD-10-CM | POA: Diagnosis not present

## 2014-02-12 DIAGNOSIS — IMO0001 Reserved for inherently not codable concepts without codable children: Secondary | ICD-10-CM | POA: Diagnosis not present

## 2014-02-12 DIAGNOSIS — Z9181 History of falling: Secondary | ICD-10-CM | POA: Diagnosis not present

## 2014-02-12 DIAGNOSIS — R279 Unspecified lack of coordination: Secondary | ICD-10-CM | POA: Diagnosis not present

## 2014-02-12 DIAGNOSIS — S090XXS Injury of blood vessels of head, not elsewhere classified, sequela: Secondary | ICD-10-CM | POA: Diagnosis not present

## 2014-02-12 DIAGNOSIS — S0292XS Unspecified fracture of facial bones, sequela: Secondary | ICD-10-CM | POA: Diagnosis not present

## 2014-02-12 DIAGNOSIS — R269 Unspecified abnormalities of gait and mobility: Secondary | ICD-10-CM | POA: Diagnosis not present

## 2014-02-12 DIAGNOSIS — G40909 Epilepsy, unspecified, not intractable, without status epilepticus: Secondary | ICD-10-CM | POA: Diagnosis not present

## 2014-02-15 DIAGNOSIS — S0292XS Unspecified fracture of facial bones, sequela: Secondary | ICD-10-CM | POA: Diagnosis not present

## 2014-02-15 DIAGNOSIS — S75909S Unspecified injury of unspecified blood vessel at hip and thigh level, unspecified leg, sequela: Secondary | ICD-10-CM | POA: Diagnosis not present

## 2014-02-15 DIAGNOSIS — S090XXS Injury of blood vessels of head, not elsewhere classified, sequela: Secondary | ICD-10-CM | POA: Diagnosis not present

## 2014-02-15 DIAGNOSIS — R279 Unspecified lack of coordination: Secondary | ICD-10-CM | POA: Diagnosis not present

## 2014-02-15 DIAGNOSIS — R269 Unspecified abnormalities of gait and mobility: Secondary | ICD-10-CM | POA: Diagnosis not present

## 2014-02-15 DIAGNOSIS — S0291XS Unspecified fracture of skull, sequela: Secondary | ICD-10-CM | POA: Diagnosis not present

## 2014-02-15 DIAGNOSIS — G40909 Epilepsy, unspecified, not intractable, without status epilepticus: Secondary | ICD-10-CM | POA: Diagnosis not present

## 2014-02-15 DIAGNOSIS — IMO0001 Reserved for inherently not codable concepts without codable children: Secondary | ICD-10-CM | POA: Diagnosis not present

## 2014-02-16 DIAGNOSIS — I6200 Nontraumatic subdural hemorrhage, unspecified: Secondary | ICD-10-CM | POA: Diagnosis not present

## 2014-02-16 DIAGNOSIS — S0280XA Fracture of other specified skull and facial bones, unspecified side, initial encounter for closed fracture: Secondary | ICD-10-CM | POA: Diagnosis not present

## 2014-02-16 DIAGNOSIS — Z9181 History of falling: Secondary | ICD-10-CM | POA: Diagnosis not present

## 2014-02-16 DIAGNOSIS — R269 Unspecified abnormalities of gait and mobility: Secondary | ICD-10-CM | POA: Diagnosis not present

## 2014-02-19 DIAGNOSIS — S0292XS Unspecified fracture of facial bones, sequela: Secondary | ICD-10-CM | POA: Diagnosis not present

## 2014-02-19 DIAGNOSIS — S85909S Unspecified injury of unspecified blood vessel at lower leg level, unspecified leg, sequela: Secondary | ICD-10-CM | POA: Diagnosis not present

## 2014-02-19 DIAGNOSIS — R279 Unspecified lack of coordination: Secondary | ICD-10-CM | POA: Diagnosis not present

## 2014-02-19 DIAGNOSIS — IMO0001 Reserved for inherently not codable concepts without codable children: Secondary | ICD-10-CM | POA: Diagnosis not present

## 2014-02-19 DIAGNOSIS — S0291XS Unspecified fracture of skull, sequela: Secondary | ICD-10-CM | POA: Diagnosis not present

## 2014-02-19 DIAGNOSIS — R269 Unspecified abnormalities of gait and mobility: Secondary | ICD-10-CM | POA: Diagnosis not present

## 2014-02-19 DIAGNOSIS — G40909 Epilepsy, unspecified, not intractable, without status epilepticus: Secondary | ICD-10-CM | POA: Diagnosis not present

## 2014-02-19 DIAGNOSIS — S090XXS Injury of blood vessels of head, not elsewhere classified, sequela: Secondary | ICD-10-CM | POA: Diagnosis not present

## 2014-02-21 DIAGNOSIS — R279 Unspecified lack of coordination: Secondary | ICD-10-CM | POA: Diagnosis not present

## 2014-02-21 DIAGNOSIS — IMO0001 Reserved for inherently not codable concepts without codable children: Secondary | ICD-10-CM | POA: Diagnosis not present

## 2014-02-21 DIAGNOSIS — S0291XS Unspecified fracture of skull, sequela: Secondary | ICD-10-CM | POA: Diagnosis not present

## 2014-02-21 DIAGNOSIS — S0292XS Unspecified fracture of facial bones, sequela: Secondary | ICD-10-CM | POA: Diagnosis not present

## 2014-02-21 DIAGNOSIS — R269 Unspecified abnormalities of gait and mobility: Secondary | ICD-10-CM | POA: Diagnosis not present

## 2014-02-21 DIAGNOSIS — G40909 Epilepsy, unspecified, not intractable, without status epilepticus: Secondary | ICD-10-CM | POA: Diagnosis not present

## 2014-02-21 DIAGNOSIS — S55909S Unspecified injury of unspecified blood vessel at forearm level, unspecified arm, sequela: Secondary | ICD-10-CM | POA: Diagnosis not present

## 2014-02-21 DIAGNOSIS — S090XXS Injury of blood vessels of head, not elsewhere classified, sequela: Secondary | ICD-10-CM | POA: Diagnosis not present

## 2014-02-23 DIAGNOSIS — R269 Unspecified abnormalities of gait and mobility: Secondary | ICD-10-CM | POA: Diagnosis not present

## 2014-02-23 DIAGNOSIS — S090XXS Injury of blood vessels of head, not elsewhere classified, sequela: Secondary | ICD-10-CM | POA: Diagnosis not present

## 2014-02-23 DIAGNOSIS — S85909S Unspecified injury of unspecified blood vessel at lower leg level, unspecified leg, sequela: Secondary | ICD-10-CM | POA: Diagnosis not present

## 2014-02-23 DIAGNOSIS — G40909 Epilepsy, unspecified, not intractable, without status epilepticus: Secondary | ICD-10-CM | POA: Diagnosis not present

## 2014-02-23 DIAGNOSIS — R279 Unspecified lack of coordination: Secondary | ICD-10-CM | POA: Diagnosis not present

## 2014-02-23 DIAGNOSIS — S0291XS Unspecified fracture of skull, sequela: Secondary | ICD-10-CM | POA: Diagnosis not present

## 2014-02-23 DIAGNOSIS — IMO0001 Reserved for inherently not codable concepts without codable children: Secondary | ICD-10-CM | POA: Diagnosis not present

## 2014-02-26 DIAGNOSIS — S0291XS Unspecified fracture of skull, sequela: Secondary | ICD-10-CM | POA: Diagnosis not present

## 2014-02-26 DIAGNOSIS — S090XXS Injury of blood vessels of head, not elsewhere classified, sequela: Secondary | ICD-10-CM | POA: Diagnosis not present

## 2014-02-26 DIAGNOSIS — R279 Unspecified lack of coordination: Secondary | ICD-10-CM | POA: Diagnosis not present

## 2014-02-26 DIAGNOSIS — IMO0001 Reserved for inherently not codable concepts without codable children: Secondary | ICD-10-CM | POA: Diagnosis not present

## 2014-02-26 DIAGNOSIS — S55909S Unspecified injury of unspecified blood vessel at forearm level, unspecified arm, sequela: Secondary | ICD-10-CM | POA: Diagnosis not present

## 2014-02-26 DIAGNOSIS — G40909 Epilepsy, unspecified, not intractable, without status epilepticus: Secondary | ICD-10-CM | POA: Diagnosis not present

## 2014-02-26 DIAGNOSIS — R269 Unspecified abnormalities of gait and mobility: Secondary | ICD-10-CM | POA: Diagnosis not present

## 2014-02-28 DIAGNOSIS — S090XXS Injury of blood vessels of head, not elsewhere classified, sequela: Secondary | ICD-10-CM | POA: Diagnosis not present

## 2014-02-28 DIAGNOSIS — G40909 Epilepsy, unspecified, not intractable, without status epilepticus: Secondary | ICD-10-CM | POA: Diagnosis not present

## 2014-02-28 DIAGNOSIS — S55909S Unspecified injury of unspecified blood vessel at forearm level, unspecified arm, sequela: Secondary | ICD-10-CM | POA: Diagnosis not present

## 2014-02-28 DIAGNOSIS — R269 Unspecified abnormalities of gait and mobility: Secondary | ICD-10-CM | POA: Diagnosis not present

## 2014-02-28 DIAGNOSIS — S0291XS Unspecified fracture of skull, sequela: Secondary | ICD-10-CM | POA: Diagnosis not present

## 2014-02-28 DIAGNOSIS — IMO0001 Reserved for inherently not codable concepts without codable children: Secondary | ICD-10-CM | POA: Diagnosis not present

## 2014-02-28 DIAGNOSIS — R279 Unspecified lack of coordination: Secondary | ICD-10-CM | POA: Diagnosis not present

## 2014-03-01 ENCOUNTER — Encounter: Payer: Self-pay | Admitting: Neurology

## 2014-03-01 ENCOUNTER — Ambulatory Visit (INDEPENDENT_AMBULATORY_CARE_PROVIDER_SITE_OTHER): Payer: Medicare Other | Admitting: Neurology

## 2014-03-01 ENCOUNTER — Encounter (INDEPENDENT_AMBULATORY_CARE_PROVIDER_SITE_OTHER): Payer: Self-pay

## 2014-03-01 VITALS — BP 110/59 | HR 77 | Temp 97.9°F | Ht 61.0 in | Wt 120.0 lb

## 2014-03-01 DIAGNOSIS — M25619 Stiffness of unspecified shoulder, not elsewhere classified: Secondary | ICD-10-CM

## 2014-03-01 DIAGNOSIS — G40909 Epilepsy, unspecified, not intractable, without status epilepticus: Secondary | ICD-10-CM | POA: Diagnosis not present

## 2014-03-01 DIAGNOSIS — S069XAA Unspecified intracranial injury with loss of consciousness status unknown, initial encounter: Secondary | ICD-10-CM | POA: Diagnosis not present

## 2014-03-01 DIAGNOSIS — S0291XS Unspecified fracture of skull, sequela: Secondary | ICD-10-CM | POA: Diagnosis not present

## 2014-03-01 DIAGNOSIS — S090XXS Injury of blood vessels of head, not elsewhere classified, sequela: Secondary | ICD-10-CM | POA: Diagnosis not present

## 2014-03-01 DIAGNOSIS — R269 Unspecified abnormalities of gait and mobility: Secondary | ICD-10-CM | POA: Diagnosis not present

## 2014-03-01 DIAGNOSIS — IMO0001 Reserved for inherently not codable concepts without codable children: Secondary | ICD-10-CM | POA: Diagnosis not present

## 2014-03-01 DIAGNOSIS — S85909S Unspecified injury of unspecified blood vessel at lower leg level, unspecified leg, sequela: Secondary | ICD-10-CM | POA: Diagnosis not present

## 2014-03-01 DIAGNOSIS — I6200 Nontraumatic subdural hemorrhage, unspecified: Secondary | ICD-10-CM | POA: Diagnosis not present

## 2014-03-01 DIAGNOSIS — S069X9A Unspecified intracranial injury with loss of consciousness of unspecified duration, initial encounter: Secondary | ICD-10-CM

## 2014-03-01 DIAGNOSIS — R471 Dysarthria and anarthria: Secondary | ICD-10-CM | POA: Diagnosis not present

## 2014-03-01 DIAGNOSIS — S0292XS Unspecified fracture of facial bones, sequela: Secondary | ICD-10-CM | POA: Diagnosis not present

## 2014-03-01 DIAGNOSIS — S065X9A Traumatic subdural hemorrhage with loss of consciousness of unspecified duration, initial encounter: Secondary | ICD-10-CM

## 2014-03-01 DIAGNOSIS — R279 Unspecified lack of coordination: Secondary | ICD-10-CM | POA: Diagnosis not present

## 2014-03-01 DIAGNOSIS — G119 Hereditary ataxia, unspecified: Secondary | ICD-10-CM

## 2014-03-01 DIAGNOSIS — S065XAA Traumatic subdural hemorrhage with loss of consciousness status unknown, initial encounter: Secondary | ICD-10-CM

## 2014-03-01 DIAGNOSIS — G241 Genetic torsion dystonia: Secondary | ICD-10-CM

## 2014-03-01 NOTE — Patient Instructions (Signed)
Your seizure medicine is just for precaution. I will let the neurosurgeon decide how long you have to be on it. Your neurosurgeon will most likely order another head CT down the Road for comparison. Please followup with your neurosurgeon as planned. For your movement disorder and gait disorder you have an appointment at [facility] coming up next month. I would like for you to be more cautious and wore mindful that your gait disorder will likely get worse. Please use your walker at all times. Drink more water.

## 2014-03-01 NOTE — Progress Notes (Signed)
Subjective:    Patient ID: Claudia Perez is a 63 y.o. female.  HPI    Star Age, MD, PhD [facility] [address], [address] [place], [place] [address]  Dear Dr. Drema Dallas,  I saw your patient, Claudia Perez, upon your kind request in my neurologic clinic today for initial consultation after a recent hospitalization for a fall with skull fracture and traumatic brain injury sustained. The patient is unaccompanied today. As you know, Claudia Perez is a 63 year old right-handed woman with an underlying medical history of gait disorder, proximal weakness and abnormal involuntary movements , history of left subdural hematoma, and right temporal contusion in December 2004, status post left parietal craniotomy when residing in [place], as well as a diagnosis of dystonia muscular deformans in [place]. I have previously seen her last year. For her movement disorder I referred her to [facility], Dr. Vallarie Mare. She missed an appointment at [facility] recently, but as I understand, she has an appointment with them in 5/15. She was admitted to [facility] on 12/09/2013 and discharged to inpatient rehabilitation on 12/14/2013. She presented with a fall with injury of the back of her head. She denied loss of consciousness or syncopal events. She fell backwards from stairs. Her initial head CT from 12/09/2013 showed: Nondisplaced left parietal skull fracture is noted, with large left parietal and occipital scalp hematoma. Small subdural hemorrhage is seen in the anterior interhemispheric fissure as well as the vertex of the falx as well as left tentorial region. Right frontal intraparenchymal hemorrhage is noted. Subarachnoid hemorrhage is noted in bilateral frontal and right temporal regions. In addition I reviewed the images through the PACS system. She had a followup head CT on 12/11/2013: 1. Bifrontal and right temporal hemorrhagic contusions show modest increase from  12/09/2013. 2. Small volume, multi focal subarachnoid hemorrhage shows mild increase. No hydrocephalus. 3. Thin, multi focal subdural hematoma is without significant increase. 4. Nondepressed left calvarial fracture, unchanged. In addition, I reviewed the images through the PACS system.  Cervical spine CT from 12/09/2013 showed no cervical spine fracture. Thoracic spine CT from 12/11/2013 showed normal findings. During her hospital stay neurosurgery was consulted (Dr. Luiz Ochoa) and recommended nonoperative treatment. The traumatic brain injury team worked with her and recommended inpatient rehabilitation and Dr. Naaman Plummer from [facility] was consulted. Upon discharge, outpatient followup with Drs. Naaman Plummer and Luiz Ochoa was recommended. She saw Dr. 9, RR on 01/31/2014 and followup in neurosurgery, and she was referred to Dr. Carles Collet in neurology.  Today, she reports, that she was started on Topamax during the hospital stay. She has not been told, that there is an appointment made for her with Dr. Carles Collet yet. She has been using her 2 wheeled walker, she is in PT outpatient. She fell last week, while using her walker and got scraped on her the lower jaw, no head injury, no LOC and she landed on her buttocks.  I first met her on 02/01/2013 and she previously followed with Dr. Erling Cruz. She previously had normal ceruloplasmin levels in November 2010 as well as negative workup for Huntington's disease. Head CT in April 2012 showed generalized atrophy and CT of her C-spine showed spondylosis. She has had some memory loss and depression but no bowel or bladder incontinence or hallucinations. EMG and nerve conduction studies as well as ESR have been normal. She was last seen by Dr. Erling Cruz in February 2014 at which time he suggested a referral to a tertiary care center  which I initiated in March 2014. 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 Swallow study in February 2014 showed mild penetration but no aspiration. EMG nerve conductions test in February 2014 showed no evidence of a peripheral neuropathy. EMG in the right lower extremity showed mild stable chronic L5 radiculopathy. Right upper extremity EMG showed multilevel cervical radiculopathy-type findings.  Her Past Medical History Is Significant For: Past Medical History  Diagnosis Date  . Mobility impaired   . Hypercholesteremia   . Vitamin D deficiency   . Shortness of breath     "even now, just talking" (03/27/2013)  . Hyperthyroidism     "a little bit; don't take RX for it" (03/27/2013)  . GERD (gastroesophageal reflux disease)   . Seizures     "think I might have; been blacking out several times in the past" (03/27/2013)  . Fall at home 03/27/2013    "lost consciousness; fractured right clavicle" (03/27/2013)  . Other closed skull fracture without mention of intracranial injury, unspecified state of consciousness   . Subdural hemorrhage   . Abnormality of gait     Her Past Surgical History Is Significant For: Past Surgical History  Procedure Laterality Date  . Appendectomy  1960's  . Eye surgery  02/2013    cataract extraction w/ intraocular lens implant  . Brain surgery  10/2003    SDH evac    Her Family History Is Significant For: Family History  Problem Relation Age of Onset  . Hypertension Mother   . Cirrhosis Brother     Her Social History Is Significant For: History   Social History  . Marital Status: Married    Spouse Name: N/A    Number of Children: N/A  . Years of Education: N/A   Social History Main Topics  . Smoking status: Never Smoker   . Smokeless tobacco: Never Used  . Alcohol Use: Yes     Comment: 03/27/2013 glass of wine "couple times/yr"  . Drug Use: No  . Sexual Activity: Not Currently   Other Topics Concern  . None   Social History Narrative  . None    Her Allergies Are:   Allergies  Allergen Reactions  . Nitrofurantoin Itching  . Sulfamethoxazole-Trimethoprim Itching  . Sulfamethoxazole Rash  :   Her Current Medications Are:  Outpatient Encounter Prescriptions as of 03/01/2014  Medication Sig  . topiramate (TOPAMAX) 25 MG tablet Take 25 mg by mouth daily.  . traMADol (ULTRAM) 50 MG tablet Take 50 mg by mouth every 6 (six) hours as needed.  . Multiple Vitamin (MULTIVITAMIN WITH MINERALS) TABS Take 1 tablet by mouth daily.   Review of Systems:  Out of a complete 14 point review of systems, all are reviewed and negative with the exception of these symptoms as listed below:  Review of Systems  Constitutional: Positive for activity change (disinterest), appetite change and fatigue.  HENT: Positive for trouble swallowing.   Eyes: Negative.   Respiratory: Positive for cough.        Snoring  Cardiovascular: Negative.   Gastrointestinal: Negative.   Endocrine: Negative.   Genitourinary: Positive for enuresis.  Musculoskeletal: Positive for arthralgias.  Skin: Negative.   Allergic/Immunologic: Negative.   Neurological: Positive for dizziness, tremors, seizures, syncope and speech difficulty.  Hematological: Negative.   Psychiatric/Behavioral: Positive for sleep disturbance (snoring, e.d.s.).    Objective:  Neurologic Exam  Physical Exam Physical Examination:   Filed Vitals:   03/01/14 1018  BP: 110/59  Pulse: 77  Temp: 97.9 F (36.6 C)    General Examination: The patient is a very pleasant 63 y.o. female in no acute distress. She appears frail and mildly deconditioned.   HEENT: Normocephalic, atraumatic, perhaps a slight degree of facial masking noted. She has moderate dysarthria. She has abnormal intonation. She has mild increase in tone in her neck with mild decrease in active and passive ROM.  No lip, neck or jaw tremor is noted. Pupils are equal, round and reactive to light. Funduscopy is unremarkable, s/p b/l cataract repairs. Extraocular  tracking is mildly saccadic and upgaze limitation. Tongue protrudes centrally and palate elevates symmetrically. She has a small, healing scar in the lower jaw on the R. Chest is clear to auscultation, heart sounds are normal without murmurs, rubs or gallops, abdomen is soft, nontender with normal bowel sounds.  She has no pitting edema in the distal lower extremities.  Neurologically: Mental status: The patient is awake, alert and oriented in all 4 spheres. She has no evidence of aphasia, agnosia or apraxia or anomia.  Cranial nerves are as described under HEENT exam. Motor exam: She has a fairly normal tone throughout. She has no evidence of resting tremor. She does display involuntary dyskinetic truncal movements and in her extremities, to a lesser degree. She has no postural or action tremor. She has no myoclonus and no clear athetosis. Strength for the most part is 4/5 but she does have proximal more than distal weakness throughout in the upper and lower extremities. Reflexes are 2+ throughout. Sensory exam is intact to light touch, pinprick, vibration and temperature sense in the UEs and LEs. Her stance is slightly wide-based and on cerebellar testing she has mild past pointing and slight intention tremor. Her hand movements and rapid alternating patting are mildly impaired and so is her finger taps bilaterally. She walks mildly ataxic with her 2 wheeled walker. She walks with a mildly wide-based gait. She turns slowly.   Assessment and Plan:   In summary, Claudia Perez is a very pleasant 63 year old right-handed woman with an underlying medical history of gait disorder, proximal weakness and abnormal involuntary movements, history of left subdural hematoma, and right temporal contusion in December 2004, status post left parietal craniotomy when residing in [place], as well as a diagnosis of dystonia muscular deformans years ago, who had a recent TBI in the context of a fall; she sustained  ifrontal and right temporal hemorrhagic contusions, multi focal subarachnoid hemorrhages and multi focal subdural hematomas, and a non-depressed left calvarial fracture. For her TBI, she is advised to FU with NSG as previouly planned. She is worse with her gait disorder as compared to last year for me. She is advised, that likely, her gait disorder will progress. She is advised to use her walker at all times and be more mindful of her constant fall risk. For her movement disorder I have previously referred her to [facility] and she has an appointment with them in 5/15. I had a long chat with the patient about my findings and her problems. I encouraged the patient to eat healthy and drink more water. From my end of things, she can likely FU with me as needed. She is plugged in with PT, neurosurgery and the movement disorder specialists at [facility].   Thank you very much for allowing me to participate in the care of this  nice patient. If I can be of any further assistance to you please do not hesitate to call me at [phone].  Sincerely,   Star Age, MD, PhD

## 2014-03-06 DIAGNOSIS — S55909S Unspecified injury of unspecified blood vessel at forearm level, unspecified arm, sequela: Secondary | ICD-10-CM | POA: Diagnosis not present

## 2014-03-06 DIAGNOSIS — R269 Unspecified abnormalities of gait and mobility: Secondary | ICD-10-CM | POA: Diagnosis not present

## 2014-03-06 DIAGNOSIS — S090XXS Injury of blood vessels of head, not elsewhere classified, sequela: Secondary | ICD-10-CM | POA: Diagnosis not present

## 2014-03-06 DIAGNOSIS — S0292XS Unspecified fracture of facial bones, sequela: Secondary | ICD-10-CM | POA: Diagnosis not present

## 2014-03-06 DIAGNOSIS — G40909 Epilepsy, unspecified, not intractable, without status epilepticus: Secondary | ICD-10-CM | POA: Diagnosis not present

## 2014-03-06 DIAGNOSIS — IMO0001 Reserved for inherently not codable concepts without codable children: Secondary | ICD-10-CM | POA: Diagnosis not present

## 2014-03-06 DIAGNOSIS — R279 Unspecified lack of coordination: Secondary | ICD-10-CM | POA: Diagnosis not present

## 2014-03-06 DIAGNOSIS — S0291XS Unspecified fracture of skull, sequela: Secondary | ICD-10-CM | POA: Diagnosis not present

## 2014-03-07 DIAGNOSIS — S0292XS Unspecified fracture of facial bones, sequela: Secondary | ICD-10-CM | POA: Diagnosis not present

## 2014-03-07 DIAGNOSIS — G40909 Epilepsy, unspecified, not intractable, without status epilepticus: Secondary | ICD-10-CM | POA: Diagnosis not present

## 2014-03-07 DIAGNOSIS — S090XXS Injury of blood vessels of head, not elsewhere classified, sequela: Secondary | ICD-10-CM | POA: Diagnosis not present

## 2014-03-07 DIAGNOSIS — S0291XS Unspecified fracture of skull, sequela: Secondary | ICD-10-CM | POA: Diagnosis not present

## 2014-03-07 DIAGNOSIS — R279 Unspecified lack of coordination: Secondary | ICD-10-CM | POA: Diagnosis not present

## 2014-03-07 DIAGNOSIS — R269 Unspecified abnormalities of gait and mobility: Secondary | ICD-10-CM | POA: Diagnosis not present

## 2014-03-07 DIAGNOSIS — IMO0001 Reserved for inherently not codable concepts without codable children: Secondary | ICD-10-CM | POA: Diagnosis not present

## 2014-03-08 DIAGNOSIS — S090XXS Injury of blood vessels of head, not elsewhere classified, sequela: Secondary | ICD-10-CM | POA: Diagnosis not present

## 2014-03-08 DIAGNOSIS — S75909S Unspecified injury of unspecified blood vessel at hip and thigh level, unspecified leg, sequela: Secondary | ICD-10-CM | POA: Diagnosis not present

## 2014-03-08 DIAGNOSIS — R269 Unspecified abnormalities of gait and mobility: Secondary | ICD-10-CM | POA: Diagnosis not present

## 2014-03-08 DIAGNOSIS — S0292XS Unspecified fracture of facial bones, sequela: Secondary | ICD-10-CM | POA: Diagnosis not present

## 2014-03-08 DIAGNOSIS — IMO0001 Reserved for inherently not codable concepts without codable children: Secondary | ICD-10-CM | POA: Diagnosis not present

## 2014-03-08 DIAGNOSIS — R279 Unspecified lack of coordination: Secondary | ICD-10-CM | POA: Diagnosis not present

## 2014-03-08 DIAGNOSIS — G40909 Epilepsy, unspecified, not intractable, without status epilepticus: Secondary | ICD-10-CM | POA: Diagnosis not present

## 2014-03-08 DIAGNOSIS — S0291XS Unspecified fracture of skull, sequela: Secondary | ICD-10-CM | POA: Diagnosis not present

## 2014-03-12 DIAGNOSIS — R279 Unspecified lack of coordination: Secondary | ICD-10-CM | POA: Diagnosis not present

## 2014-03-12 DIAGNOSIS — IMO0001 Reserved for inherently not codable concepts without codable children: Secondary | ICD-10-CM | POA: Diagnosis not present

## 2014-03-12 DIAGNOSIS — G40909 Epilepsy, unspecified, not intractable, without status epilepticus: Secondary | ICD-10-CM | POA: Diagnosis not present

## 2014-03-12 DIAGNOSIS — S0292XS Unspecified fracture of facial bones, sequela: Secondary | ICD-10-CM | POA: Diagnosis not present

## 2014-03-12 DIAGNOSIS — S0291XS Unspecified fracture of skull, sequela: Secondary | ICD-10-CM | POA: Diagnosis not present

## 2014-03-12 DIAGNOSIS — S95909S Unspecified injury of unspecified blood vessel at ankle and foot level, unspecified leg, sequela: Secondary | ICD-10-CM | POA: Diagnosis not present

## 2014-03-12 DIAGNOSIS — S090XXS Injury of blood vessels of head, not elsewhere classified, sequela: Secondary | ICD-10-CM | POA: Diagnosis not present

## 2014-03-12 DIAGNOSIS — R269 Unspecified abnormalities of gait and mobility: Secondary | ICD-10-CM | POA: Diagnosis not present

## 2014-03-14 DIAGNOSIS — IMO0001 Reserved for inherently not codable concepts without codable children: Secondary | ICD-10-CM | POA: Diagnosis not present

## 2014-03-14 DIAGNOSIS — S090XXS Injury of blood vessels of head, not elsewhere classified, sequela: Secondary | ICD-10-CM | POA: Diagnosis not present

## 2014-03-14 DIAGNOSIS — S85909S Unspecified injury of unspecified blood vessel at lower leg level, unspecified leg, sequela: Secondary | ICD-10-CM | POA: Diagnosis not present

## 2014-03-14 DIAGNOSIS — R279 Unspecified lack of coordination: Secondary | ICD-10-CM | POA: Diagnosis not present

## 2014-03-14 DIAGNOSIS — R269 Unspecified abnormalities of gait and mobility: Secondary | ICD-10-CM | POA: Diagnosis not present

## 2014-03-14 DIAGNOSIS — G40909 Epilepsy, unspecified, not intractable, without status epilepticus: Secondary | ICD-10-CM | POA: Diagnosis not present

## 2014-03-14 DIAGNOSIS — S0291XS Unspecified fracture of skull, sequela: Secondary | ICD-10-CM | POA: Diagnosis not present

## 2014-03-15 DIAGNOSIS — IMO0001 Reserved for inherently not codable concepts without codable children: Secondary | ICD-10-CM | POA: Diagnosis not present

## 2014-03-15 DIAGNOSIS — R279 Unspecified lack of coordination: Secondary | ICD-10-CM | POA: Diagnosis not present

## 2014-03-15 DIAGNOSIS — S090XXS Injury of blood vessels of head, not elsewhere classified, sequela: Secondary | ICD-10-CM | POA: Diagnosis not present

## 2014-03-15 DIAGNOSIS — S55909S Unspecified injury of unspecified blood vessel at forearm level, unspecified arm, sequela: Secondary | ICD-10-CM | POA: Diagnosis not present

## 2014-03-15 DIAGNOSIS — S0291XS Unspecified fracture of skull, sequela: Secondary | ICD-10-CM | POA: Diagnosis not present

## 2014-03-15 DIAGNOSIS — G40909 Epilepsy, unspecified, not intractable, without status epilepticus: Secondary | ICD-10-CM | POA: Diagnosis not present

## 2014-03-15 DIAGNOSIS — R269 Unspecified abnormalities of gait and mobility: Secondary | ICD-10-CM | POA: Diagnosis not present

## 2014-03-20 DIAGNOSIS — R269 Unspecified abnormalities of gait and mobility: Secondary | ICD-10-CM | POA: Diagnosis not present

## 2014-03-20 DIAGNOSIS — G40909 Epilepsy, unspecified, not intractable, without status epilepticus: Secondary | ICD-10-CM | POA: Diagnosis not present

## 2014-03-20 DIAGNOSIS — S090XXS Injury of blood vessels of head, not elsewhere classified, sequela: Secondary | ICD-10-CM | POA: Diagnosis not present

## 2014-03-20 DIAGNOSIS — IMO0001 Reserved for inherently not codable concepts without codable children: Secondary | ICD-10-CM | POA: Diagnosis not present

## 2014-03-20 DIAGNOSIS — S159XXS Injury of unspecified blood vessel at neck level, sequela: Secondary | ICD-10-CM | POA: Diagnosis not present

## 2014-03-20 DIAGNOSIS — R279 Unspecified lack of coordination: Secondary | ICD-10-CM | POA: Diagnosis not present

## 2014-03-20 DIAGNOSIS — S0292XS Unspecified fracture of facial bones, sequela: Secondary | ICD-10-CM | POA: Diagnosis not present

## 2014-03-20 DIAGNOSIS — S0291XS Unspecified fracture of skull, sequela: Secondary | ICD-10-CM | POA: Diagnosis not present

## 2014-03-21 DIAGNOSIS — S0292XS Unspecified fracture of facial bones, sequela: Secondary | ICD-10-CM | POA: Diagnosis not present

## 2014-03-21 DIAGNOSIS — R279 Unspecified lack of coordination: Secondary | ICD-10-CM | POA: Diagnosis not present

## 2014-03-21 DIAGNOSIS — S0291XS Unspecified fracture of skull, sequela: Secondary | ICD-10-CM | POA: Diagnosis not present

## 2014-03-21 DIAGNOSIS — R269 Unspecified abnormalities of gait and mobility: Secondary | ICD-10-CM | POA: Diagnosis not present

## 2014-03-21 DIAGNOSIS — S090XXS Injury of blood vessels of head, not elsewhere classified, sequela: Secondary | ICD-10-CM | POA: Diagnosis not present

## 2014-03-21 DIAGNOSIS — G40909 Epilepsy, unspecified, not intractable, without status epilepticus: Secondary | ICD-10-CM | POA: Diagnosis not present

## 2014-03-21 DIAGNOSIS — IMO0001 Reserved for inherently not codable concepts without codable children: Secondary | ICD-10-CM | POA: Diagnosis not present

## 2014-03-23 DIAGNOSIS — R5383 Other fatigue: Secondary | ICD-10-CM | POA: Diagnosis not present

## 2014-03-23 DIAGNOSIS — R269 Unspecified abnormalities of gait and mobility: Secondary | ICD-10-CM | POA: Diagnosis not present

## 2014-03-23 DIAGNOSIS — E7800 Pure hypercholesterolemia, unspecified: Secondary | ICD-10-CM | POA: Diagnosis not present

## 2014-03-23 DIAGNOSIS — R82998 Other abnormal findings in urine: Secondary | ICD-10-CM | POA: Diagnosis not present

## 2014-03-23 DIAGNOSIS — E559 Vitamin D deficiency, unspecified: Secondary | ICD-10-CM | POA: Diagnosis not present

## 2014-03-23 DIAGNOSIS — R5381 Other malaise: Secondary | ICD-10-CM | POA: Diagnosis not present

## 2014-03-23 DIAGNOSIS — Z8679 Personal history of other diseases of the circulatory system: Secondary | ICD-10-CM | POA: Diagnosis not present

## 2014-03-23 DIAGNOSIS — I69928 Other speech and language deficits following unspecified cerebrovascular disease: Secondary | ICD-10-CM | POA: Diagnosis not present

## 2014-03-23 DIAGNOSIS — Z0000 Encounter for general adult medical examination without abnormal findings: Secondary | ICD-10-CM | POA: Diagnosis not present

## 2014-03-26 DIAGNOSIS — IMO0001 Reserved for inherently not codable concepts without codable children: Secondary | ICD-10-CM | POA: Diagnosis not present

## 2014-03-26 DIAGNOSIS — G40909 Epilepsy, unspecified, not intractable, without status epilepticus: Secondary | ICD-10-CM | POA: Diagnosis not present

## 2014-03-26 DIAGNOSIS — R279 Unspecified lack of coordination: Secondary | ICD-10-CM | POA: Diagnosis not present

## 2014-03-26 DIAGNOSIS — R269 Unspecified abnormalities of gait and mobility: Secondary | ICD-10-CM | POA: Diagnosis not present

## 2014-03-26 DIAGNOSIS — S0292XS Unspecified fracture of facial bones, sequela: Secondary | ICD-10-CM | POA: Diagnosis not present

## 2014-03-26 DIAGNOSIS — S95909S Unspecified injury of unspecified blood vessel at ankle and foot level, unspecified leg, sequela: Secondary | ICD-10-CM | POA: Diagnosis not present

## 2014-03-26 DIAGNOSIS — S0291XS Unspecified fracture of skull, sequela: Secondary | ICD-10-CM | POA: Diagnosis not present

## 2014-03-26 DIAGNOSIS — S090XXS Injury of blood vessels of head, not elsewhere classified, sequela: Secondary | ICD-10-CM | POA: Diagnosis not present

## 2014-03-27 ENCOUNTER — Telehealth: Payer: Self-pay

## 2014-03-27 NOTE — Telephone Encounter (Signed)
Dr. Drema Dallas would like for you to call him to discuss a antidepressant for the patient. Contact # is [phone].

## 2014-03-28 DIAGNOSIS — S0291XS Unspecified fracture of skull, sequela: Secondary | ICD-10-CM | POA: Diagnosis not present

## 2014-03-28 DIAGNOSIS — R279 Unspecified lack of coordination: Secondary | ICD-10-CM | POA: Diagnosis not present

## 2014-03-28 DIAGNOSIS — S85909S Unspecified injury of unspecified blood vessel at lower leg level, unspecified leg, sequela: Secondary | ICD-10-CM | POA: Diagnosis not present

## 2014-03-28 DIAGNOSIS — G40909 Epilepsy, unspecified, not intractable, without status epilepticus: Secondary | ICD-10-CM | POA: Diagnosis not present

## 2014-03-28 DIAGNOSIS — S090XXS Injury of blood vessels of head, not elsewhere classified, sequela: Secondary | ICD-10-CM | POA: Diagnosis not present

## 2014-03-28 DIAGNOSIS — R269 Unspecified abnormalities of gait and mobility: Secondary | ICD-10-CM | POA: Diagnosis not present

## 2014-03-28 DIAGNOSIS — IMO0001 Reserved for inherently not codable concepts without codable children: Secondary | ICD-10-CM | POA: Diagnosis not present

## 2014-03-28 NOTE — Telephone Encounter (Signed)
i spoke with Dr. Drema Dallas today. We will start an antidepressant medication

## 2014-03-29 DIAGNOSIS — R269 Unspecified abnormalities of gait and mobility: Secondary | ICD-10-CM | POA: Diagnosis not present

## 2014-03-29 DIAGNOSIS — G40909 Epilepsy, unspecified, not intractable, without status epilepticus: Secondary | ICD-10-CM | POA: Diagnosis not present

## 2014-03-29 DIAGNOSIS — S75909S Unspecified injury of unspecified blood vessel at hip and thigh level, unspecified leg, sequela: Secondary | ICD-10-CM | POA: Diagnosis not present

## 2014-03-29 DIAGNOSIS — S0291XS Unspecified fracture of skull, sequela: Secondary | ICD-10-CM | POA: Diagnosis not present

## 2014-03-29 DIAGNOSIS — S090XXS Injury of blood vessels of head, not elsewhere classified, sequela: Secondary | ICD-10-CM | POA: Diagnosis not present

## 2014-03-29 DIAGNOSIS — R279 Unspecified lack of coordination: Secondary | ICD-10-CM | POA: Diagnosis not present

## 2014-03-29 DIAGNOSIS — S0292XS Unspecified fracture of facial bones, sequela: Secondary | ICD-10-CM | POA: Diagnosis not present

## 2014-03-29 DIAGNOSIS — IMO0001 Reserved for inherently not codable concepts without codable children: Secondary | ICD-10-CM | POA: Diagnosis not present

## 2014-03-30 ENCOUNTER — Encounter: Payer: Self-pay | Admitting: Physical Medicine & Rehabilitation

## 2014-03-30 ENCOUNTER — Encounter: Payer: Medicare Other | Attending: Physical Medicine & Rehabilitation | Admitting: Physical Medicine & Rehabilitation

## 2014-03-30 VITALS — BP 146/76 | HR 76 | Resp 14 | Ht 61.0 in | Wt 118.4 lb

## 2014-03-30 DIAGNOSIS — S069XAA Unspecified intracranial injury with loss of consciousness status unknown, initial encounter: Secondary | ICD-10-CM | POA: Diagnosis not present

## 2014-03-30 DIAGNOSIS — S069X9A Unspecified intracranial injury with loss of consciousness of unspecified duration, initial encounter: Secondary | ICD-10-CM

## 2014-03-30 DIAGNOSIS — S02109A Fracture of base of skull, unspecified side, initial encounter for closed fracture: Secondary | ICD-10-CM | POA: Diagnosis not present

## 2014-03-30 DIAGNOSIS — R269 Unspecified abnormalities of gait and mobility: Secondary | ICD-10-CM | POA: Diagnosis not present

## 2014-03-30 DIAGNOSIS — S0292XS Unspecified fracture of facial bones, sequela: Secondary | ICD-10-CM | POA: Diagnosis not present

## 2014-03-30 DIAGNOSIS — R05 Cough: Secondary | ICD-10-CM

## 2014-03-30 DIAGNOSIS — R059 Cough, unspecified: Secondary | ICD-10-CM

## 2014-03-30 DIAGNOSIS — S0990XA Unspecified injury of head, initial encounter: Secondary | ICD-10-CM

## 2014-03-30 DIAGNOSIS — X58XXXS Exposure to other specified factors, sequela: Secondary | ICD-10-CM | POA: Insufficient documentation

## 2014-03-30 DIAGNOSIS — F32A Depression, unspecified: Secondary | ICD-10-CM | POA: Insufficient documentation

## 2014-03-30 DIAGNOSIS — F329 Major depressive disorder, single episode, unspecified: Secondary | ICD-10-CM | POA: Diagnosis not present

## 2014-03-30 DIAGNOSIS — S0291XS Unspecified fracture of skull, sequela: Secondary | ICD-10-CM | POA: Insufficient documentation

## 2014-03-30 DIAGNOSIS — F3289 Other specified depressive episodes: Secondary | ICD-10-CM | POA: Diagnosis not present

## 2014-03-30 NOTE — Progress Notes (Signed)
Subjective:    Patient ID: Claudia Perez, female    DOB: 06/18/1951, 63 y.o.   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 She was at a SNF for 2 months. She has been home a month or so. She is independent with self-care and mobility. She receives help with meals and house chores. Therapy is working with her at home but is nearing dc.   She feels depressed over the fact that she hasn't returned to her baseline. She also has some depression over the loss of a few family members over the last year. She lost a brother I believe most recently.  Claudia Perez is sometimes tearful at home around family. She feels that her energy levels are low. Her sense of smell is dulled and her appetite is not great. She has had teary eyes at times, and they have been blurry as well.   I spoke with Claudia Perez her PCP this week and we decided that she would start on an antidepressant. She hasn't picked up the rx yet.   . Pain Inventory Average Pain 7 Pain Right Now 7 My pain is sharp and tingling  In the last 24 hours, has pain interfered with the following? General activity 10 Relation with others 10 Enjoyment of life 2 What TIME of day is your pain at its worst? night Sleep (in general) Poor  Pain is worse with: walking, bending, sitting and standing Pain improves with: rest and medication Relief from Meds: 4  Mobility use a walker ability to climb steps?  no do you drive?  no needs help with transfers  Function disabled: date disabled . I need assistance with the following:  bathing, meal prep, household duties and shopping  Neuro/Psych bladder control problems tingling trouble walking spasms dizziness confusion anxiety loss of taste or smell  Prior Studies Any changes since last visit?  no  Physicians involved in your care Any changes since last visit?  no   Family History  Problem Relation Age of Onset  . Hypertension Mother   .  Cirrhosis Brother    History   Social History  . Marital Status: Married    Spouse Name: N/A    Number of Children: N/A  . Years of Education: N/A   Social History Main Topics  . Smoking status: Never Smoker   . Smokeless tobacco: Never Used  . Alcohol Use: Yes     Comment: 03/27/2013 glass of wine "couple times/yr"  . Drug Use: No  . Sexual Activity: Not Currently   Other Topics Concern  . None   Social History Narrative  . None   Past Surgical History  Procedure Laterality Date  . Appendectomy  1960's  . Eye surgery  02/2013    cataract extraction w/ intraocular lens implant  . Brain surgery  10/2003    [facility] evac   Past Medical History  Diagnosis Date  . Mobility impaired   . Hypercholesteremia   . Vitamin D deficiency   . Shortness of breath     "even now, just talking" (03/27/2013)  . Hyperthyroidism     "a little bit; don't take RX for it" (03/27/2013)  . GERD (gastroesophageal reflux disease)   . Seizures     "think I might have; been blacking out several times in the past" (03/27/2013)  . Fall at home 03/27/2013    "lost consciousness; fractured right clavicle" (03/27/2013)  . Other closed skull fracture without  mention of intracranial injury, unspecified state of consciousness   . Subdural hemorrhage   . Abnormality of gait    BP 146/76  Pulse 76  Resp 14  Ht 5\' 1"  (1.549 m)  Wt 118 lb 6.4 oz (53.706 kg)  BMI 22.38 kg/m2  SpO2 99%  Opioid Risk Score:   Fall Risk Score: High Fall Risk (>13 points) (educated and handout given for fall prevention in the home)  Review of Systems  Constitutional: Positive for appetite change and unexpected weight change.  Respiratory: Positive for cough and shortness of breath.   Genitourinary: Positive for difficulty urinating.  Musculoskeletal: Positive for gait problem.       Spasms  Neurological: Positive for dizziness.       Tingling  Psychiatric/Behavioral: Positive for confusion. The patient is nervous/anxious.     All other systems reviewed and are negative.      Objective:   Physical Exam  Constitutional: She is oriented to person, place, and time. She appears well-developed and well-nourished. No distress  HENT:  Head: Normocephalic. No tenderness over frontal sinuses  Eyes: Conjunctivae are normal. Pupils are equal, round, and reactive to light.  Neck: Normal range of motion.  Cardiovascular: Normal rate and regular rhythm.  Respiratory: Effort normal and breath sounds normal. No respiratory distress. She has no wheezes.  GI: Soft. Bowel sounds are normal. She exhibits no distension. There is no tenderness.  Musculoskeletal: She exhibits no edema. Right hip/troch moderately tender to palpation and ROM Neurological: She is alert and oriented to person, place, and time.  Still with halting speech/accent--clearer. Follows basic commands without difficulty. No focal CN signs. No nystagmus. [place] good for individualized movements of the arms and hands. She stood easily but impulsively. She walked with her walker today and tends to employ a circumducted gait pattern with the left leg. She is less steady with weight bearing on that side as well. She had small losses of balance when she changed directions. Strength and sensation seemed nearly normal however today.  Psychiatric: She is pleasant. She became spontaneously tearful when we talked about her depression and some other emotinal issues.    Assessment/Plan:  1. 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 History of prior TBI   -still with gait issues related to new injury. Seems to have problems with proprioception in the right lower extremity as well as with impulsivity  -will make referral to outpt neuro-rehab to work on gait quality and balance.    -discussed the fact that she NEEDS TO TAKE HER TIME  -reviewed the fact that her loss of smell is likely related to her TBI 3. Pain Management:   tylenol as needed for mobility  -stop topamax  as headaches have resolved and this may be causing fatigue and changes in her taste 4. Neuropsych: SSRI for depression---?celexa or lexapro per Claudia Perez  -she has had several losses in her family and is going through adjustment related to this new TBI  -some of mood lability however is from her TBI (witnessed during this visit). She is impulsive with her movements as well which goes hand and hand with the mood lability.  -educated patient and husband about this 5. URI/allergies per PCP.Marland Kitchen Recommend antihistamine for seasonal allergies short term.   All in all she's doing quite well!  I will see her back in about 2 months. 30 minutes of face to face patient care time were spent during this visit. All questions were encouraged and answered.

## 2014-03-30 NOTE — Patient Instructions (Signed)
PLEASE TAKE YOUR TIME!!!!!  TRY CLARITIN FOR ALLERGIES---OVER THE COUNTER   STOP TOPAMAX!!!!  YOUR ALTERED SMELL IS LIKELY RELATED TO YOUR INJURY  PLEASE CALL ME WITH ANY PROBLEMS OR QUESTIONS (#[phone]).

## 2014-04-03 DIAGNOSIS — G40909 Epilepsy, unspecified, not intractable, without status epilepticus: Secondary | ICD-10-CM | POA: Diagnosis not present

## 2014-04-03 DIAGNOSIS — S0291XS Unspecified fracture of skull, sequela: Secondary | ICD-10-CM | POA: Diagnosis not present

## 2014-04-03 DIAGNOSIS — S55909S Unspecified injury of unspecified blood vessel at forearm level, unspecified arm, sequela: Secondary | ICD-10-CM | POA: Diagnosis not present

## 2014-04-03 DIAGNOSIS — R279 Unspecified lack of coordination: Secondary | ICD-10-CM | POA: Diagnosis not present

## 2014-04-03 DIAGNOSIS — R269 Unspecified abnormalities of gait and mobility: Secondary | ICD-10-CM | POA: Diagnosis not present

## 2014-04-03 DIAGNOSIS — IMO0001 Reserved for inherently not codable concepts without codable children: Secondary | ICD-10-CM | POA: Diagnosis not present

## 2014-04-03 DIAGNOSIS — S090XXS Injury of blood vessels of head, not elsewhere classified, sequela: Secondary | ICD-10-CM | POA: Diagnosis not present

## 2014-04-03 DIAGNOSIS — S0292XS Unspecified fracture of facial bones, sequela: Secondary | ICD-10-CM | POA: Diagnosis not present

## 2014-04-04 DIAGNOSIS — R269 Unspecified abnormalities of gait and mobility: Secondary | ICD-10-CM | POA: Diagnosis not present

## 2014-04-04 DIAGNOSIS — S090XXS Injury of blood vessels of head, not elsewhere classified, sequela: Secondary | ICD-10-CM | POA: Diagnosis not present

## 2014-04-04 DIAGNOSIS — IMO0001 Reserved for inherently not codable concepts without codable children: Secondary | ICD-10-CM | POA: Diagnosis not present

## 2014-04-04 DIAGNOSIS — R279 Unspecified lack of coordination: Secondary | ICD-10-CM | POA: Diagnosis not present

## 2014-04-04 DIAGNOSIS — S0291XS Unspecified fracture of skull, sequela: Secondary | ICD-10-CM | POA: Diagnosis not present

## 2014-04-04 DIAGNOSIS — S85909S Unspecified injury of unspecified blood vessel at lower leg level, unspecified leg, sequela: Secondary | ICD-10-CM | POA: Diagnosis not present

## 2014-04-04 DIAGNOSIS — G40909 Epilepsy, unspecified, not intractable, without status epilepticus: Secondary | ICD-10-CM | POA: Diagnosis not present

## 2014-04-09 DIAGNOSIS — G40909 Epilepsy, unspecified, not intractable, without status epilepticus: Secondary | ICD-10-CM | POA: Diagnosis not present

## 2014-04-09 DIAGNOSIS — S090XXS Injury of blood vessels of head, not elsewhere classified, sequela: Secondary | ICD-10-CM | POA: Diagnosis not present

## 2014-04-09 DIAGNOSIS — S0291XS Unspecified fracture of skull, sequela: Secondary | ICD-10-CM | POA: Diagnosis not present

## 2014-04-09 DIAGNOSIS — R279 Unspecified lack of coordination: Secondary | ICD-10-CM | POA: Diagnosis not present

## 2014-04-09 DIAGNOSIS — S85909S Unspecified injury of unspecified blood vessel at lower leg level, unspecified leg, sequela: Secondary | ICD-10-CM | POA: Diagnosis not present

## 2014-04-09 DIAGNOSIS — IMO0001 Reserved for inherently not codable concepts without codable children: Secondary | ICD-10-CM | POA: Diagnosis not present

## 2014-04-09 DIAGNOSIS — R269 Unspecified abnormalities of gait and mobility: Secondary | ICD-10-CM | POA: Diagnosis not present

## 2014-04-11 DIAGNOSIS — IMO0001 Reserved for inherently not codable concepts without codable children: Secondary | ICD-10-CM | POA: Diagnosis not present

## 2014-04-11 DIAGNOSIS — S55909S Unspecified injury of unspecified blood vessel at forearm level, unspecified arm, sequela: Secondary | ICD-10-CM | POA: Diagnosis not present

## 2014-04-11 DIAGNOSIS — R279 Unspecified lack of coordination: Secondary | ICD-10-CM | POA: Diagnosis not present

## 2014-04-11 DIAGNOSIS — S090XXS Injury of blood vessels of head, not elsewhere classified, sequela: Secondary | ICD-10-CM | POA: Diagnosis not present

## 2014-04-11 DIAGNOSIS — S0291XS Unspecified fracture of skull, sequela: Secondary | ICD-10-CM | POA: Diagnosis not present

## 2014-04-11 DIAGNOSIS — R269 Unspecified abnormalities of gait and mobility: Secondary | ICD-10-CM | POA: Diagnosis not present

## 2014-04-11 DIAGNOSIS — G40909 Epilepsy, unspecified, not intractable, without status epilepticus: Secondary | ICD-10-CM | POA: Diagnosis not present

## 2014-04-11 DIAGNOSIS — S0292XS Unspecified fracture of facial bones, sequela: Secondary | ICD-10-CM | POA: Diagnosis not present

## 2014-04-13 DIAGNOSIS — Z1231 Encounter for screening mammogram for malignant neoplasm of breast: Secondary | ICD-10-CM | POA: Diagnosis not present

## 2014-04-13 DIAGNOSIS — Z780 Asymptomatic menopausal state: Secondary | ICD-10-CM | POA: Diagnosis not present

## 2014-04-13 DIAGNOSIS — M899 Disorder of bone, unspecified: Secondary | ICD-10-CM | POA: Diagnosis not present

## 2014-04-13 DIAGNOSIS — M949 Disorder of cartilage, unspecified: Secondary | ICD-10-CM | POA: Diagnosis not present

## 2014-04-20 ENCOUNTER — Ambulatory Visit: Payer: Medicare Other | Attending: Physical Medicine & Rehabilitation

## 2014-04-20 DIAGNOSIS — S0291XS Unspecified fracture of skull, sequela: Secondary | ICD-10-CM | POA: Diagnosis not present

## 2014-04-20 DIAGNOSIS — W19XXXS Unspecified fall, sequela: Secondary | ICD-10-CM | POA: Insufficient documentation

## 2014-04-20 DIAGNOSIS — S0292XS Unspecified fracture of facial bones, sequela: Secondary | ICD-10-CM

## 2014-04-20 DIAGNOSIS — R279 Unspecified lack of coordination: Secondary | ICD-10-CM | POA: Diagnosis not present

## 2014-04-20 DIAGNOSIS — IMO0001 Reserved for inherently not codable concepts without codable children: Secondary | ICD-10-CM | POA: Diagnosis not present

## 2014-04-20 DIAGNOSIS — R262 Difficulty in walking, not elsewhere classified: Secondary | ICD-10-CM | POA: Insufficient documentation

## 2014-04-27 DIAGNOSIS — G255 Other chorea: Secondary | ICD-10-CM | POA: Diagnosis not present

## 2014-04-27 DIAGNOSIS — R279 Unspecified lack of coordination: Secondary | ICD-10-CM | POA: Diagnosis not present

## 2014-04-27 DIAGNOSIS — Z9181 History of falling: Secondary | ICD-10-CM | POA: Diagnosis not present

## 2014-04-30 ENCOUNTER — Ambulatory Visit: Payer: Medicare Other | Attending: Physical Medicine & Rehabilitation

## 2014-04-30 DIAGNOSIS — Z8781 Personal history of (healed) traumatic fracture: Secondary | ICD-10-CM | POA: Diagnosis not present

## 2014-04-30 DIAGNOSIS — R279 Unspecified lack of coordination: Secondary | ICD-10-CM | POA: Insufficient documentation

## 2014-04-30 DIAGNOSIS — R262 Difficulty in walking, not elsewhere classified: Secondary | ICD-10-CM | POA: Insufficient documentation

## 2014-04-30 DIAGNOSIS — Z5189 Encounter for other specified aftercare: Secondary | ICD-10-CM | POA: Diagnosis not present

## 2014-04-30 DIAGNOSIS — Z87820 Personal history of traumatic brain injury: Secondary | ICD-10-CM | POA: Diagnosis not present

## 2014-05-04 ENCOUNTER — Ambulatory Visit: Payer: Medicare Other

## 2014-05-04 DIAGNOSIS — Z87820 Personal history of traumatic brain injury: Secondary | ICD-10-CM | POA: Diagnosis not present

## 2014-05-04 DIAGNOSIS — Z8781 Personal history of (healed) traumatic fracture: Secondary | ICD-10-CM | POA: Diagnosis not present

## 2014-05-04 DIAGNOSIS — R279 Unspecified lack of coordination: Secondary | ICD-10-CM | POA: Diagnosis not present

## 2014-05-04 DIAGNOSIS — R262 Difficulty in walking, not elsewhere classified: Secondary | ICD-10-CM | POA: Diagnosis not present

## 2014-05-04 DIAGNOSIS — Z5189 Encounter for other specified aftercare: Secondary | ICD-10-CM | POA: Diagnosis not present

## 2014-05-07 ENCOUNTER — Ambulatory Visit: Payer: Medicare Other

## 2014-05-07 ENCOUNTER — Emergency Department (HOSPITAL_COMMUNITY)
Admission: EM | Admit: 2014-05-07 | Discharge: 2014-05-07 | Disposition: A | Discharge status: Home / Self Care | Payer: Medicare Other | Attending: Emergency Medicine | Admitting: Emergency Medicine

## 2014-05-07 ENCOUNTER — Emergency Department (HOSPITAL_COMMUNITY): Payer: Medicare Other

## 2014-05-07 DIAGNOSIS — Z9181 History of falling: Secondary | ICD-10-CM | POA: Insufficient documentation

## 2014-05-07 DIAGNOSIS — Z8669 Personal history of other diseases of the nervous system and sense organs: Secondary | ICD-10-CM | POA: Insufficient documentation

## 2014-05-07 DIAGNOSIS — Y929 Unspecified place or not applicable: Secondary | ICD-10-CM | POA: Diagnosis not present

## 2014-05-07 DIAGNOSIS — R296 Repeated falls: Secondary | ICD-10-CM | POA: Insufficient documentation

## 2014-05-07 DIAGNOSIS — Z79899 Other long term (current) drug therapy: Secondary | ICD-10-CM | POA: Insufficient documentation

## 2014-05-07 DIAGNOSIS — Z8719 Personal history of other diseases of the digestive system: Secondary | ICD-10-CM | POA: Diagnosis not present

## 2014-05-07 DIAGNOSIS — R269 Unspecified abnormalities of gait and mobility: Secondary | ICD-10-CM | POA: Diagnosis not present

## 2014-05-07 DIAGNOSIS — Y9389 Activity, other specified: Secondary | ICD-10-CM | POA: Insufficient documentation

## 2014-05-07 DIAGNOSIS — E559 Vitamin D deficiency, unspecified: Secondary | ICD-10-CM | POA: Insufficient documentation

## 2014-05-07 DIAGNOSIS — S0990XA Unspecified injury of head, initial encounter: Secondary | ICD-10-CM | POA: Diagnosis not present

## 2014-05-07 DIAGNOSIS — Z8781 Personal history of (healed) traumatic fracture: Secondary | ICD-10-CM | POA: Insufficient documentation

## 2014-05-07 DIAGNOSIS — Z0389 Encounter for observation for other suspected diseases and conditions ruled out: Secondary | ICD-10-CM | POA: Diagnosis not present

## 2014-05-07 DIAGNOSIS — T148XXA Other injury of unspecified body region, initial encounter: Secondary | ICD-10-CM | POA: Diagnosis not present

## 2014-05-07 DIAGNOSIS — M542 Cervicalgia: Secondary | ICD-10-CM | POA: Diagnosis not present

## 2014-05-07 DIAGNOSIS — S0993XA Unspecified injury of face, initial encounter: Secondary | ICD-10-CM | POA: Diagnosis not present

## 2014-05-07 NOTE — ED Notes (Signed)
Pt returned from CT. Remains alert, oriented x4, verbalizing. VSS. Hourly rounding performed. Husband at the bedside with patient.

## 2014-05-07 NOTE — ED Notes (Signed)
Family updated as to patient's status.

## 2014-05-07 NOTE — ED Notes (Signed)
Pt arrives via EMS from physical therapy appt, fall from sitting position to floor. Pt fell forward. Neg LOC. Some repetitive questioning for EMS. Currently awake, alert, moving all extremities. Placed in c-collar by EMS, c/o neck pain and heaviness in hips.

## 2014-05-07 NOTE — ED Notes (Signed)
Family at beside. Family given emotional support. 

## 2014-05-07 NOTE — ED Provider Notes (Signed)
CSN: [id number]     Arrival date & time 05/07/14  2542 History   First MD Initiated Contact with Patient 05/07/14 0930     Chief Complaint  Patient presents with  . Fall    Level V caveat: Traumatic brain injury  HPI Is reported that the patient had a fall from a sitting position today while preparing for physical therapy.  This was witnessed by the husband.  The patient fell forward.  No loss consciousness.  EMS reports some repetitive questioning and that she was brought to the emergency department for evaluation.  Patient has a history of multiple falls.  She's had a subdural hematoma as well as a basilar skull fracture in the past.  From review of records thousand as though she always has some symptoms suggestive of prior traumatic brain injury.  Otherwise she's been in her normal state of health over the past several days.   Past Medical History  Diagnosis Date  . Mobility impaired   . Hypercholesteremia   . Vitamin D deficiency   . Shortness of breath     "even now, just talking" (03/27/2013)  . Hyperthyroidism     "a little bit; don't take RX for it" (03/27/2013)  . GERD (gastroesophageal reflux disease)   . Seizures     "think I might have; been blacking out several times in the past" (03/27/2013)  . Fall at home 03/27/2013    "lost consciousness; fractured right clavicle" (03/27/2013)  . Other closed skull fracture without mention of intracranial injury, unspecified state of consciousness   . Subdural hemorrhage   . Abnormality of gait    Past Surgical History  Procedure Laterality Date  . Appendectomy  1960's  . Eye surgery  02/2013    cataract extraction w/ intraocular lens implant  . Brain surgery  10/2003    [facility] evac   Family History  Problem Relation Age of Onset  . Hypertension Mother   . Cirrhosis Brother    History  Substance Use Topics  . Smoking status: Never Smoker   . Smokeless tobacco: Never Used  . Alcohol Use: Yes     Comment: 03/27/2013 glass of wine  "couple times/yr"   OB History   Grav Para Term Preterm Abortions TAB SAB Ect Mult Living                 Review of Systems  Unable to perform ROS     Allergies  Nitrofurantoin; Sulfamethoxazole-trimethoprim; and Sulfamethoxazole  Home Medications   Prior to Admission medications   Medication Sig Start Date End Date Taking? Authorizing Provider  acetaminophen (TYLENOL) 500 MG tablet Take 1,000 mg by mouth every 6 (six) hours as needed for moderate pain.   Yes Historical Provider, MD  citalopram (CELEXA) 10 MG tablet Take 10 mg by mouth daily.   Yes Historical Provider, MD  ergocalciferol (VITAMIN D2) 50000 UNITS capsule Take 50,000 Units by mouth once a week. On wednesdays   Yes Historical Provider, MD  traMADol (ULTRAM) 50 MG tablet Take 50 mg by mouth every 6 (six) hours as needed for moderate pain.    Yes Historical Provider, MD   BP 122/53  Pulse 69  Temp(Src) 98.4 F (36.9 C) (Oral)  Resp 21  Ht 5\' 1"  (1.549 m)  Wt 117 lb (53.071 kg)  BMI 22.12 kg/m2  SpO2 98% Physical Exam  Nursing note and vitals reviewed. Constitutional: She is oriented to person, place, and time. She appears well-developed and well-nourished. No distress.  HENT:  Head: Normocephalic and atraumatic.  Eyes: EOM are normal.  Neck: Neck supple.  Mild cervical and paracervical tenderness.  Immobilized in cervical collar  Cardiovascular: Normal rate, regular rhythm and normal heart sounds.   Pulmonary/Chest: Effort normal and breath sounds normal.  Abdominal: Soft. She exhibits no distension. There is no tenderness.  Musculoskeletal: Normal range of motion.  5 out of 5 strength in bilateral upper and lower extremity major muscle groups.  Full range of motion of bilateral ankles knees and hips.  Full range of motion of bilateral wrists elbows and shoulders.  Neurological: She is alert and oriented to person, place, and time.  Skin: Skin is warm and dry.  Psychiatric: She has a normal mood and  affect. Judgment normal.    ED Course  Procedures (including critical care time) Labs Review Labs Reviewed - No data to display  Imaging Review Ct Head Wo Contrast  05/07/2014   CLINICAL DATA:  Fall  EXAM: CT HEAD WITHOUT CONTRAST  CT CERVICAL SPINE WITHOUT CONTRAST  TECHNIQUE: Multidetector CT imaging of the head and cervical spine was performed following the standard protocol without intravenous contrast. Multiplanar CT image reconstructions of the cervical spine were also generated.  COMPARISON:  12/11/2013  FINDINGS: CT HEAD FINDINGS  Ventricles are normal in configuration. There is ventricular and sulcal enlargement reflecting moderate atrophy. No hydrocephalus.  No parenchymal masses or mass effect. 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 White matter hypoattenuation is noted more diffusely consistent with mild chronic microvascular ischemic change. There is no evidence of a recent infarct.  No extra-axial masses or abnormal fluid collections  There is no intracranial hemorrhage.  There are changes from an old left parietal craniotomy, stable. No acute skull fracture. Visualized sinuses and mastoid air cells are clear.  CT CERVICAL SPINE FINDINGS  No fracture. No spondylolisthesis. There is a mild reversal of the normal cervical lordosis centered at C5-C6 where there is mild to moderate loss of disc height as well as endplate spurring. Mild endplate spurring is also noted at C4 and at C6-C7. No significant stenosis.  Soft tissues are unremarkable.  Lung apices are clear.  IMPRESSION: HEAD CT:  No acute intracranial abnormalities.  CERVICAL CT:  No fracture or acute finding.   Electronically Signed   By: Lajean Manes M.D.   On: 05/07/2014 10:13   Ct Cervical Spine Wo Contrast  05/07/2014   CLINICAL DATA:  Fall  EXAM: CT HEAD WITHOUT CONTRAST  CT CERVICAL SPINE WITHOUT CONTRAST  TECHNIQUE: Multidetector CT imaging of the head and cervical  spine was performed following the standard protocol without intravenous contrast. Multiplanar CT image reconstructions of the cervical spine were also generated.  COMPARISON:  12/11/2013  FINDINGS: CT HEAD FINDINGS  Ventricles are normal in configuration. There is ventricular and sulcal enlargement reflecting moderate atrophy. No hydrocephalus.  No parenchymal masses or mass effect. 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 White matter hypoattenuation is noted more diffusely consistent with mild chronic microvascular ischemic change. There is no evidence of a recent infarct.  No extra-axial masses or abnormal fluid collections  There is no intracranial hemorrhage.  There are changes from an old left parietal craniotomy, stable. No acute skull fracture. Visualized sinuses and mastoid air cells are clear.  CT CERVICAL SPINE FINDINGS  No fracture. No spondylolisthesis. There is a mild reversal of the normal cervical lordosis centered  at C5-C6 where there is mild to moderate loss of disc height as well as endplate spurring. Mild endplate spurring is also noted at C4 and at C6-C7. No significant stenosis.  Soft tissues are unremarkable.  Lung apices are clear.  IMPRESSION: HEAD CT:  No acute intracranial abnormalities.  CERVICAL CT:  No fracture or acute finding.   Electronically Signed   By: Lajean Manes M.D.   On: 05/07/2014 10:13  I personally reviewed the imaging tests through PACS system I reviewed available ER/hospitalization records through the EMR    EKG Interpretation None      MDM   Final diagnoses:  Head injury    Patient is overall well-appearing.  CT head C-spine normal.  It sounds as though this was a mechanical fall.  The patient has some baseline gait instability.  Baseline mental status.  Discharge home in good condition.  PCP followup    Hoy Morn, MD 05/07/14 [phone]

## 2014-05-07 NOTE — Progress Notes (Signed)
Chaplain responded to level two trauma. Chaplain spoke with husband who said he was fine and was just waiting for test results. Chaplain available if family's needs change.

## 2014-05-07 NOTE — ED Provider Notes (Signed)
ECG interpretation   Date: 05/07/2014  Rate: 71  Rhythm: normal sinus rhythm  QRS Axis: normal  Intervals: normal  ST/T Wave abnormalities: normal  Conduction Disutrbances: none  Narrative Interpretation:   Old EKG Reviewed: No significant changes noted     Hoy Morn, MD 05/07/14 1011

## 2014-05-08 ENCOUNTER — Telehealth: Payer: Self-pay | Admitting: Physical Medicine & Rehabilitation

## 2014-05-08 DIAGNOSIS — S069XAA Unspecified intracranial injury with loss of consciousness status unknown, initial encounter: Secondary | ICD-10-CM

## 2014-05-08 DIAGNOSIS — S069X9A Unspecified intracranial injury with loss of consciousness of unspecified duration, initial encounter: Secondary | ICD-10-CM

## 2014-05-08 NOTE — Telephone Encounter (Signed)
Message copied by Meredith Staggers on Tue May 08, 2014 10:48 PM ------      Message from: Delrae Sawyers D      Created: Fri May 04, 2014  4:02 PM      Regarding: Request OT referral       Dr. Naaman Plummer,            Thank you for referring this pt to [place] for PT. So far we have seen her for 3 visits and she is wonderful to work with.            I wanted to ask if you could send a referral for OT? She is reporting issues with her upper extremity and I believe she would benefit from OT.             Let me know if you have any questions!             Thanks,      Sonia Baller ------

## 2014-05-08 NOTE — Telephone Encounter (Signed)
Order written for outpt OT

## 2014-05-11 ENCOUNTER — Ambulatory Visit: Payer: Medicare Other

## 2014-05-11 DIAGNOSIS — R279 Unspecified lack of coordination: Secondary | ICD-10-CM | POA: Diagnosis not present

## 2014-05-11 DIAGNOSIS — R262 Difficulty in walking, not elsewhere classified: Secondary | ICD-10-CM | POA: Diagnosis not present

## 2014-05-11 DIAGNOSIS — Z5189 Encounter for other specified aftercare: Secondary | ICD-10-CM | POA: Diagnosis not present

## 2014-05-11 DIAGNOSIS — Z8781 Personal history of (healed) traumatic fracture: Secondary | ICD-10-CM | POA: Diagnosis not present

## 2014-05-11 DIAGNOSIS — Z87820 Personal history of traumatic brain injury: Secondary | ICD-10-CM | POA: Diagnosis not present

## 2014-05-14 ENCOUNTER — Ambulatory Visit: Payer: Medicare Other

## 2014-05-14 DIAGNOSIS — R262 Difficulty in walking, not elsewhere classified: Secondary | ICD-10-CM | POA: Diagnosis not present

## 2014-05-14 DIAGNOSIS — Z8781 Personal history of (healed) traumatic fracture: Secondary | ICD-10-CM | POA: Diagnosis not present

## 2014-05-14 DIAGNOSIS — Z5189 Encounter for other specified aftercare: Secondary | ICD-10-CM | POA: Diagnosis not present

## 2014-05-14 DIAGNOSIS — R279 Unspecified lack of coordination: Secondary | ICD-10-CM | POA: Diagnosis not present

## 2014-05-14 DIAGNOSIS — Z87820 Personal history of traumatic brain injury: Secondary | ICD-10-CM | POA: Diagnosis not present

## 2014-05-18 ENCOUNTER — Ambulatory Visit: Payer: Medicare Other | Admitting: Physical Therapy

## 2014-05-18 DIAGNOSIS — R262 Difficulty in walking, not elsewhere classified: Secondary | ICD-10-CM | POA: Diagnosis not present

## 2014-05-18 DIAGNOSIS — Z8781 Personal history of (healed) traumatic fracture: Secondary | ICD-10-CM | POA: Diagnosis not present

## 2014-05-18 DIAGNOSIS — Z87820 Personal history of traumatic brain injury: Secondary | ICD-10-CM | POA: Diagnosis not present

## 2014-05-18 DIAGNOSIS — Z5189 Encounter for other specified aftercare: Secondary | ICD-10-CM | POA: Diagnosis not present

## 2014-05-18 DIAGNOSIS — R279 Unspecified lack of coordination: Secondary | ICD-10-CM | POA: Diagnosis not present

## 2014-05-21 ENCOUNTER — Ambulatory Visit: Payer: Medicare Other

## 2014-05-21 DIAGNOSIS — Z5189 Encounter for other specified aftercare: Secondary | ICD-10-CM | POA: Diagnosis not present

## 2014-05-21 DIAGNOSIS — R262 Difficulty in walking, not elsewhere classified: Secondary | ICD-10-CM | POA: Diagnosis not present

## 2014-05-21 DIAGNOSIS — R279 Unspecified lack of coordination: Secondary | ICD-10-CM | POA: Diagnosis not present

## 2014-05-21 DIAGNOSIS — Z87820 Personal history of traumatic brain injury: Secondary | ICD-10-CM | POA: Diagnosis not present

## 2014-05-21 DIAGNOSIS — Z8781 Personal history of (healed) traumatic fracture: Secondary | ICD-10-CM | POA: Diagnosis not present

## 2014-05-25 ENCOUNTER — Ambulatory Visit: Payer: Medicare Other

## 2014-05-25 DIAGNOSIS — Z5189 Encounter for other specified aftercare: Secondary | ICD-10-CM | POA: Diagnosis not present

## 2014-05-25 DIAGNOSIS — R262 Difficulty in walking, not elsewhere classified: Secondary | ICD-10-CM | POA: Diagnosis not present

## 2014-05-25 DIAGNOSIS — R279 Unspecified lack of coordination: Secondary | ICD-10-CM | POA: Diagnosis not present

## 2014-05-25 DIAGNOSIS — Z87820 Personal history of traumatic brain injury: Secondary | ICD-10-CM | POA: Diagnosis not present

## 2014-05-25 DIAGNOSIS — Z8781 Personal history of (healed) traumatic fracture: Secondary | ICD-10-CM | POA: Diagnosis not present

## 2014-05-30 ENCOUNTER — Ambulatory Visit: Payer: Medicare Other | Attending: Physical Medicine & Rehabilitation

## 2014-05-30 DIAGNOSIS — Z5189 Encounter for other specified aftercare: Secondary | ICD-10-CM | POA: Diagnosis not present

## 2014-05-30 DIAGNOSIS — R262 Difficulty in walking, not elsewhere classified: Secondary | ICD-10-CM | POA: Diagnosis not present

## 2014-05-30 DIAGNOSIS — Z8781 Personal history of (healed) traumatic fracture: Secondary | ICD-10-CM | POA: Diagnosis not present

## 2014-05-30 DIAGNOSIS — Z87820 Personal history of traumatic brain injury: Secondary | ICD-10-CM | POA: Insufficient documentation

## 2014-05-30 DIAGNOSIS — R279 Unspecified lack of coordination: Secondary | ICD-10-CM | POA: Diagnosis not present

## 2014-06-04 ENCOUNTER — Ambulatory Visit: Payer: Medicare Other

## 2014-06-04 DIAGNOSIS — Z5189 Encounter for other specified aftercare: Secondary | ICD-10-CM | POA: Diagnosis not present

## 2014-06-07 ENCOUNTER — Ambulatory Visit: Payer: Medicare Other

## 2014-06-08 ENCOUNTER — Ambulatory Visit: Payer: Medicare Other | Admitting: Occupational Therapy

## 2014-06-08 ENCOUNTER — Ambulatory Visit: Payer: Medicare Other | Admitting: Physical Therapy

## 2014-06-08 ENCOUNTER — Encounter: Payer: Self-pay | Admitting: Physical Medicine & Rehabilitation

## 2014-06-08 ENCOUNTER — Encounter: Payer: Medicare Other | Attending: Physical Medicine & Rehabilitation | Admitting: Physical Medicine & Rehabilitation

## 2014-06-08 VITALS — BP 133/73 | HR 73 | Resp 14 | Ht 61.0 in | Wt 117.0 lb

## 2014-06-08 DIAGNOSIS — F3289 Other specified depressive episodes: Secondary | ICD-10-CM

## 2014-06-08 DIAGNOSIS — Z5189 Encounter for other specified aftercare: Secondary | ICD-10-CM | POA: Diagnosis not present

## 2014-06-08 DIAGNOSIS — S069X5A Unspecified intracranial injury with loss of consciousness greater than 24 hours with return to pre-existing conscious level, initial encounter: Secondary | ICD-10-CM

## 2014-06-08 DIAGNOSIS — I951 Orthostatic hypotension: Secondary | ICD-10-CM | POA: Diagnosis not present

## 2014-06-08 DIAGNOSIS — S0990XA Unspecified injury of head, initial encounter: Secondary | ICD-10-CM

## 2014-06-08 DIAGNOSIS — R55 Syncope and collapse: Secondary | ICD-10-CM | POA: Insufficient documentation

## 2014-06-08 DIAGNOSIS — X58XXXS Exposure to other specified factors, sequela: Secondary | ICD-10-CM | POA: Insufficient documentation

## 2014-06-08 DIAGNOSIS — F329 Major depressive disorder, single episode, unspecified: Secondary | ICD-10-CM

## 2014-06-08 DIAGNOSIS — F32A Depression, unspecified: Secondary | ICD-10-CM

## 2014-06-08 LAB — CBC
HEMATOCRIT: 41.6 % (ref 36.0–46.0)
Hemoglobin: 14.2 g/dL (ref 12.0–15.0)
MCH: 31.4 pg (ref 26.0–34.0)
MCHC: 34.1 g/dL (ref 30.0–36.0)
MCV: 92 fL (ref 78.0–100.0)
Platelets: 275 10*3/uL (ref 150–400)
RBC: 4.52 MIL/uL (ref 3.87–5.11)
RDW: 13.2 % (ref 11.5–15.5)
WBC: 8 10*3/uL (ref 4.0–10.5)

## 2014-06-08 LAB — COMPREHENSIVE METABOLIC PANEL
ALT: 13 U/L (ref 0–35)
AST: 20 U/L (ref 0–37)
Albumin: 4.3 g/dL (ref 3.5–5.2)
Alkaline Phosphatase: 77 U/L (ref 39–117)
BILIRUBIN TOTAL: 0.3 mg/dL (ref 0.2–1.2)
BUN: 11 mg/dL (ref 6–23)
CO2: 25 meq/L (ref 19–32)
Calcium: 9.5 mg/dL (ref 8.4–10.5)
Chloride: 103 mEq/L (ref 96–112)
Creat: 0.73 mg/dL (ref 0.50–1.10)
GLUCOSE: 87 mg/dL (ref 70–99)
Potassium: 4.6 mEq/L (ref 3.5–5.3)
Sodium: 137 mEq/L (ref 135–145)
Total Protein: 6.9 g/dL (ref 6.0–8.3)

## 2014-06-08 NOTE — Patient Instructions (Signed)
PLEASE PURCHASE THIGH HIGH TED STOCKINGS----PUT THESE ON EACH DAY WHEN YOU GET UP. TAKE OFF AT NIGHT  MAKE SURE YOU'RE DRINKING PLENTY OF FLUIDS  PLEASE CALL ME WITH ANY PROBLEMS OR QUESTIONS (#[phone]).

## 2014-06-08 NOTE — Progress Notes (Signed)
Subjective:    Patient ID: Claudia Perez, female    DOB: [birth year], 63 y.o.   MRN: [id number]  HPI  Claudia Perez is back regarding her TBI and gait disorder.  Within days after I last saw her she had another fall. She reports that she "blacked out" causing the fall. I reviewed her EKG, and CT's which were are unremarkable.   At the time of her fall, she was in the lobby at outpt therapy. She was walking to her chair to sit down when she became "fuzzy and woozy" and as she sat down she passed out.  The ED called this a "mechanical fall".  The patient disagrees.   She also reports vertigo and therapy is working on this as well.   Pain Inventory Average Pain 5 Pain Right Now 6 My pain is dull  In the last 24 hours, has pain interfered with the following? General activity 0 Relation with others 0 Enjoyment of life 0 What TIME of day is your pain at its worst? evening Sleep (in general) Fair  Pain is worse with: walking, bending and standing Pain improves with: rest Relief from Meds: na  Mobility walk with assistance use a walker ability to climb steps?  no do you drive?  no needs help with transfers Do you have any goals in this area?  yes  Function retired I need assistance with the following:  bathing, meal prep, household duties and shopping  Neuro/Psych bladder control problems tingling trouble walking spasms depression loss of taste or smell  Prior Studies Any changes since last visit?  no  Physicians involved in your care Any changes since last visit?  no   Family History  Problem Relation Age of Onset  . Hypertension Mother   . Cirrhosis Brother    History   Social History  . Marital Status: Married    Spouse Name: N/A    Number of Children: N/A  . Years of Education: N/A   Social History Main Topics  . Smoking status: Never Smoker   . Smokeless tobacco: Never Used  . Alcohol Use: Yes     Comment: 03/27/2013 glass of wine "couple times/yr"  .  Drug Use: No  . Sexual Activity: Not Currently   Other Topics Concern  . None   Social History Narrative  . None   Past Surgical History  Procedure Laterality Date  . Appendectomy  1960's  . Eye surgery  02/2013    cataract extraction w/ intraocular lens implant  . Brain surgery  10/2003    [facility] evac   Past Medical History  Diagnosis Date  . Mobility impaired   . Hypercholesteremia   . Vitamin D deficiency   . Shortness of breath     "even now, just talking" (03/27/2013)  . Hyperthyroidism     "a little bit; don't take RX for it" (03/27/2013)  . GERD (gastroesophageal reflux disease)   . Seizures     "think I might have; been blacking out several times in the past" (03/27/2013)  . Fall at home 03/27/2013    "lost consciousness; fractured right clavicle" (03/27/2013)  . Other closed skull fracture without mention of intracranial injury, unspecified state of consciousness   . Subdural hemorrhage   . Abnormality of gait    BP 133/73  Pulse 73  Resp 14  Ht 5\' 1"  (1.549 m)  Wt 117 lb (53.071 kg)  BMI 22.12 kg/m2  SpO2 100%  Opioid Risk Score:   Fall  Risk Score: High Fall Risk (>13 points) (pt educated on fall risk, brochure given to pt)    Review of Systems  Constitutional: Positive for unexpected weight change.  HENT:       Loss of smell  Respiratory: Positive for cough and wheezing.   Genitourinary:       Bladder control problems  Neurological:       Tingling, spasms  Psychiatric/Behavioral:       Depression  All other systems reviewed and are negative.      Objective:   Physical Exam  Constitutional: She is oriented to person, place, and time. She appears well-developed and well-nourished. No distress  HENT:  Head: Normocephalic. No tenderness over frontal sinuses  Eyes: Conjunctivae are normal. Pupils are equal, round, and reactive to light.  Neck: Normal range of motion.  Cardiovascular: Normal rate and regular rhythm.  Respiratory: Effort normal and  breath sounds normal. No respiratory distress. She has no wheezes.  GI: Soft. Bowel sounds are normal. She exhibits no distension. There is no tenderness.  Musculoskeletal: She exhibits no edema. Right hip/troch moderately tender to palpation and ROM Neurological: She is alert and oriented to person, place, and time.  Still with halting speech/accent.  Follows basic commands without difficulty. No focal CN signs. No nystagmus. [place] stable in the arms and hands. She stood easily but still is a bit impulsive. Her gait is less circumducted but she drags the right foot.   Strength and sensation seemed nearly normal however today. She experienced no dizziness when standing or changing positions today.  Psychiatric: She is pleasant. She became spontaneously tearful when we talked about her depression and some other emotinal issues.   Assessment/Plan:  1. 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 Multifactorial gait disorder/syncope--overall she has improved -we discussed the fact that her problems are multifactorial related to impulsivity, coordination, vertigo, and potentially even her BP/HR/volume status -continue with therapy to improve balance and strength, also address vestibular issues -checked orthostatic vs today: lying 131/73--57, sitting 142/74--67, standing 96/77--56  -I advised her to purchase thigh high TEDS and wear when she's up.  -encouraged more fluid intake  -probably needs to follow up with pcp regarding this as well  -ordered a cbc and cmet as well today 3. Pain Management: tylenol as needed for mobility    4. Neuropsych: SSRI for depression---celexa  This has helped. She is feeling better    Follow up with me in 2 months. 30 minutes of face to face patient care time were spent during this visit. All questions were encouraged and answered.

## 2014-06-11 ENCOUNTER — Ambulatory Visit: Payer: Medicare Other

## 2014-06-11 DIAGNOSIS — Z5189 Encounter for other specified aftercare: Secondary | ICD-10-CM | POA: Diagnosis not present

## 2014-06-15 ENCOUNTER — Ambulatory Visit: Payer: Medicare Other | Admitting: Occupational Therapy

## 2014-06-15 ENCOUNTER — Ambulatory Visit: Payer: Medicare Other

## 2014-06-15 DIAGNOSIS — Z5189 Encounter for other specified aftercare: Secondary | ICD-10-CM | POA: Diagnosis not present

## 2014-06-18 ENCOUNTER — Ambulatory Visit: Payer: Medicare Other

## 2014-06-18 ENCOUNTER — Encounter: Payer: Medicare Other | Admitting: Occupational Therapy

## 2014-06-19 ENCOUNTER — Ambulatory Visit: Payer: Medicare Other | Admitting: Occupational Therapy

## 2014-06-19 DIAGNOSIS — Z5189 Encounter for other specified aftercare: Secondary | ICD-10-CM | POA: Diagnosis not present

## 2014-06-22 ENCOUNTER — Ambulatory Visit: Payer: Medicare Other | Admitting: Occupational Therapy

## 2014-06-22 ENCOUNTER — Ambulatory Visit: Payer: Medicare Other

## 2014-06-22 DIAGNOSIS — Z5189 Encounter for other specified aftercare: Secondary | ICD-10-CM | POA: Diagnosis not present

## 2014-06-25 ENCOUNTER — Ambulatory Visit: Payer: Medicare Other | Admitting: Occupational Therapy

## 2014-06-25 ENCOUNTER — Ambulatory Visit: Payer: Medicare Other

## 2014-06-25 DIAGNOSIS — Z5189 Encounter for other specified aftercare: Secondary | ICD-10-CM | POA: Diagnosis not present

## 2014-06-29 ENCOUNTER — Ambulatory Visit: Payer: Medicare Other | Admitting: Occupational Therapy

## 2014-06-29 ENCOUNTER — Ambulatory Visit: Payer: Medicare Other

## 2014-06-29 DIAGNOSIS — Z5189 Encounter for other specified aftercare: Secondary | ICD-10-CM | POA: Diagnosis not present

## 2014-06-29 DIAGNOSIS — R1310 Dysphagia, unspecified: Secondary | ICD-10-CM | POA: Diagnosis not present

## 2014-06-29 DIAGNOSIS — Z1211 Encounter for screening for malignant neoplasm of colon: Secondary | ICD-10-CM | POA: Diagnosis not present

## 2014-06-29 DIAGNOSIS — K219 Gastro-esophageal reflux disease without esophagitis: Secondary | ICD-10-CM | POA: Diagnosis not present

## 2014-07-03 ENCOUNTER — Ambulatory Visit: Payer: Medicare Other | Attending: Physical Medicine & Rehabilitation | Admitting: Occupational Therapy

## 2014-07-03 DIAGNOSIS — Z87820 Personal history of traumatic brain injury: Secondary | ICD-10-CM | POA: Insufficient documentation

## 2014-07-03 DIAGNOSIS — Z5189 Encounter for other specified aftercare: Secondary | ICD-10-CM | POA: Diagnosis not present

## 2014-07-03 DIAGNOSIS — R279 Unspecified lack of coordination: Secondary | ICD-10-CM | POA: Diagnosis not present

## 2014-07-03 DIAGNOSIS — R262 Difficulty in walking, not elsewhere classified: Secondary | ICD-10-CM | POA: Diagnosis not present

## 2014-07-03 DIAGNOSIS — Z8781 Personal history of (healed) traumatic fracture: Secondary | ICD-10-CM | POA: Insufficient documentation

## 2014-07-06 ENCOUNTER — Ambulatory Visit: Payer: Medicare Other | Admitting: Occupational Therapy

## 2014-07-06 DIAGNOSIS — R7309 Other abnormal glucose: Secondary | ICD-10-CM | POA: Diagnosis not present

## 2014-07-06 DIAGNOSIS — E7800 Pure hypercholesterolemia, unspecified: Secondary | ICD-10-CM | POA: Diagnosis not present

## 2014-07-06 DIAGNOSIS — E559 Vitamin D deficiency, unspecified: Secondary | ICD-10-CM | POA: Diagnosis not present

## 2014-07-06 DIAGNOSIS — I951 Orthostatic hypotension: Secondary | ICD-10-CM | POA: Diagnosis not present

## 2014-07-10 ENCOUNTER — Ambulatory Visit: Payer: Medicare Other | Admitting: Occupational Therapy

## 2014-07-10 DIAGNOSIS — Z87820 Personal history of traumatic brain injury: Secondary | ICD-10-CM | POA: Diagnosis not present

## 2014-07-10 DIAGNOSIS — Z5189 Encounter for other specified aftercare: Secondary | ICD-10-CM | POA: Diagnosis not present

## 2014-07-10 DIAGNOSIS — R279 Unspecified lack of coordination: Secondary | ICD-10-CM | POA: Diagnosis not present

## 2014-07-10 DIAGNOSIS — R262 Difficulty in walking, not elsewhere classified: Secondary | ICD-10-CM | POA: Diagnosis not present

## 2014-07-10 DIAGNOSIS — Z8781 Personal history of (healed) traumatic fracture: Secondary | ICD-10-CM | POA: Diagnosis not present

## 2014-07-13 ENCOUNTER — Ambulatory Visit: Payer: Medicare Other | Admitting: Occupational Therapy

## 2014-07-13 DIAGNOSIS — Z8781 Personal history of (healed) traumatic fracture: Secondary | ICD-10-CM | POA: Diagnosis not present

## 2014-07-13 DIAGNOSIS — R279 Unspecified lack of coordination: Secondary | ICD-10-CM | POA: Diagnosis not present

## 2014-07-13 DIAGNOSIS — Z87820 Personal history of traumatic brain injury: Secondary | ICD-10-CM | POA: Diagnosis not present

## 2014-07-13 DIAGNOSIS — R262 Difficulty in walking, not elsewhere classified: Secondary | ICD-10-CM | POA: Diagnosis not present

## 2014-07-13 DIAGNOSIS — Z5189 Encounter for other specified aftercare: Secondary | ICD-10-CM | POA: Diagnosis not present

## 2014-07-17 ENCOUNTER — Ambulatory Visit: Payer: Medicare Other | Admitting: Occupational Therapy

## 2014-07-17 DIAGNOSIS — R279 Unspecified lack of coordination: Secondary | ICD-10-CM | POA: Diagnosis not present

## 2014-07-17 DIAGNOSIS — Z8781 Personal history of (healed) traumatic fracture: Secondary | ICD-10-CM | POA: Diagnosis not present

## 2014-07-17 DIAGNOSIS — R262 Difficulty in walking, not elsewhere classified: Secondary | ICD-10-CM | POA: Diagnosis not present

## 2014-07-17 DIAGNOSIS — Z5189 Encounter for other specified aftercare: Secondary | ICD-10-CM | POA: Diagnosis not present

## 2014-07-17 DIAGNOSIS — Z87820 Personal history of traumatic brain injury: Secondary | ICD-10-CM | POA: Diagnosis not present

## 2014-07-20 ENCOUNTER — Ambulatory Visit: Payer: Medicare Other | Admitting: Occupational Therapy

## 2014-07-20 DIAGNOSIS — R262 Difficulty in walking, not elsewhere classified: Secondary | ICD-10-CM | POA: Diagnosis not present

## 2014-07-20 DIAGNOSIS — R279 Unspecified lack of coordination: Secondary | ICD-10-CM | POA: Diagnosis not present

## 2014-07-20 DIAGNOSIS — Z8781 Personal history of (healed) traumatic fracture: Secondary | ICD-10-CM | POA: Diagnosis not present

## 2014-07-20 DIAGNOSIS — Z87820 Personal history of traumatic brain injury: Secondary | ICD-10-CM | POA: Diagnosis not present

## 2014-07-20 DIAGNOSIS — Z5189 Encounter for other specified aftercare: Secondary | ICD-10-CM | POA: Diagnosis not present

## 2014-08-02 ENCOUNTER — Other Ambulatory Visit: Payer: Self-pay | Admitting: Gastroenterology

## 2014-08-02 DIAGNOSIS — A048 Other specified bacterial intestinal infections: Secondary | ICD-10-CM | POA: Diagnosis not present

## 2014-08-02 DIAGNOSIS — K2970 Gastritis, unspecified, without bleeding: Secondary | ICD-10-CM | POA: Diagnosis not present

## 2014-08-02 DIAGNOSIS — K5730 Diverticulosis of large intestine without perforation or abscess without bleeding: Secondary | ICD-10-CM | POA: Diagnosis not present

## 2014-08-02 DIAGNOSIS — K2990 Gastroduodenitis, unspecified, without bleeding: Secondary | ICD-10-CM | POA: Diagnosis not present

## 2014-08-02 DIAGNOSIS — Z1211 Encounter for screening for malignant neoplasm of colon: Secondary | ICD-10-CM | POA: Diagnosis not present

## 2014-08-02 DIAGNOSIS — R1310 Dysphagia, unspecified: Secondary | ICD-10-CM | POA: Diagnosis not present

## 2014-08-10 ENCOUNTER — Encounter: Payer: Medicare Other | Attending: Physical Medicine & Rehabilitation | Admitting: Physical Medicine & Rehabilitation

## 2014-08-10 ENCOUNTER — Encounter: Payer: Self-pay | Admitting: Physical Medicine & Rehabilitation

## 2014-08-10 VITALS — BP 138/53 | HR 68 | Resp 14 | Ht 62.0 in | Wt 119.0 lb

## 2014-08-10 DIAGNOSIS — G255 Other chorea: Secondary | ICD-10-CM | POA: Insufficient documentation

## 2014-08-10 DIAGNOSIS — G249 Dystonia, unspecified: Secondary | ICD-10-CM | POA: Insufficient documentation

## 2014-08-10 DIAGNOSIS — R55 Syncope and collapse: Secondary | ICD-10-CM | POA: Diagnosis not present

## 2014-08-10 DIAGNOSIS — I951 Orthostatic hypotension: Secondary | ICD-10-CM | POA: Diagnosis not present

## 2014-08-10 DIAGNOSIS — S0990XA Unspecified injury of head, initial encounter: Secondary | ICD-10-CM | POA: Diagnosis not present

## 2014-08-10 DIAGNOSIS — F3289 Other specified depressive episodes: Secondary | ICD-10-CM

## 2014-08-10 DIAGNOSIS — R270 Ataxia, unspecified: Secondary | ICD-10-CM | POA: Insufficient documentation

## 2014-08-10 DIAGNOSIS — S06369A Traumatic hemorrhage of cerebrum, unspecified, with loss of consciousness of unspecified duration, initial encounter: Secondary | ICD-10-CM

## 2014-08-10 DIAGNOSIS — F32A Depression, unspecified: Secondary | ICD-10-CM

## 2014-08-10 DIAGNOSIS — F329 Major depressive disorder, single episode, unspecified: Secondary | ICD-10-CM | POA: Diagnosis not present

## 2014-08-10 DIAGNOSIS — S06355A Traumatic hemorrhage of left cerebrum with loss of consciousness greater than 24 hours with return to pre-existing conscious level, initial encounter: Secondary | ICD-10-CM

## 2014-08-10 MED ORDER — METHYLPHENIDATE HCL 5 MG PO TABS: 5.0000 mg | ORAL_TABLET | Freq: Two times a day (BID) | ORAL | Status: DC

## 2014-08-10 NOTE — Patient Instructions (Addendum)
PLEASE CALL ME WITH ANY PROBLEMS OR QUESTIONS (#[phone]).     TRY TO GINKGO BILOBA, VITAMIN B SUPPLEMENTATION, COENZYME Q-10 FOR ENERGY

## 2014-08-10 NOTE — Progress Notes (Signed)
Subjective:    Patient ID: Claudia Perez, female    DOB: 1951/01/20, 63 y.o.   MRN: [id number]  HPI  Mrs. Shugars is back regarding her TBI and gait disorder. Her BP has been better. She has been drinking plenty and wearing her thigh high TEDS. She has since completed PT.   She denies pain but still has some weakness. She lacks energy sometimes. Her vitamin d deficiency has been supplemented. Her sleep is fair. Her husband states that she snores a lot. Thyroid levels apparently have been normal.   Her mood has been better. However, she is intending on going back to [place] next spring. Her husband is upset by that proposition.  Pain Inventory Average Pain 0 Pain Right Now 0 My pain is weakness  In the last 24 hours, has pain interfered with the following? General activity 1 Relation with others 5 Enjoyment of life 8 What TIME of day is your pain at its worst? evening Sleep (in general) Fair  Pain is worse with: walking, bending, standing and some activites Pain improves with: therapy/exercise Relief from Meds: 6  Mobility walk with assistance use a walker  Function retired I need assistance with the following:  bathing, meal prep, household duties and shopping  Neuro/Psych bladder control problems weakness numbness tingling trouble walking spasms depression  Prior Studies Any changes since last visit?  no  Physicians involved in your care Any changes since last visit?  no   Family History  Problem Relation Age of Onset  . Hypertension Mother   . Cirrhosis Brother    History   Social History  . Marital Status: Married    Spouse Name: N/A    Number of Children: N/A  . Years of Education: N/A   Social History Main Topics  . Smoking status: Never Smoker   . Smokeless tobacco: Never Used  . Alcohol Use: Yes     Comment: 03/27/2013 glass of wine "couple times/yr"  . Drug Use: No  . Sexual Activity: Not Currently   Other Topics Concern  . None    Social History Narrative  . None   Past Surgical History  Procedure Laterality Date  . Appendectomy  1960's  . Eye surgery  02/2013    cataract extraction w/ intraocular lens implant  . Brain surgery  10/2003    [facility] evac   Past Medical History  Diagnosis Date  . Mobility impaired   . Hypercholesteremia   . Vitamin D deficiency   . Shortness of breath     "even now, just talking" (03/27/2013)  . Hyperthyroidism     "a little bit; don't take RX for it" (03/27/2013)  . GERD (gastroesophageal reflux disease)   . Seizures     "think I might have; been blacking out several times in the past" (03/27/2013)  . Fall at home 03/27/2013    "lost consciousness; fractured right clavicle" (03/27/2013)  . Other closed skull fracture without mention of intracranial injury, unspecified state of consciousness   . Subdural hemorrhage   . Abnormality of gait    BP 138/53  Pulse 68  Resp 14  Ht 5\' 2"  (1.575 m)  Wt 119 lb (53.978 kg)  BMI 21.76 kg/m2  SpO2 95%  Opioid Risk Score:   Fall Risk Score: Moderate Fall Risk (6-13 points) (pt educated)    Review of Systems  Genitourinary:       Bladder control problems  Musculoskeletal: Positive for gait problem.  Neurological: Positive for weakness and  numbness.       Tingling,spasms  Psychiatric/Behavioral:       Depression  All other systems reviewed and are negative.      Objective:   Physical Exam Constitutional: She is oriented to person, place, and time. She appears well-developed and well-nourished. No distress  HENT:  Head: Normocephalic. No tenderness over frontal sinuses  Eyes: Conjunctivae are normal. Pupils are equal, round, and reactive to light.  Neck: Normal range of motion.  Cardiovascular: Normal rate and regular rhythm.  Respiratory: Effort normal and breath sounds normal. No respiratory distress. She has no wheezes.  GI: Soft. Bowel sounds are normal. She exhibits no distension. There is no tenderness.   Musculoskeletal: She exhibits no edema. Right hip/troch moderately tender to palpation and ROM Neurological: She is alert and oriented to person, place, and time.  Improved attention and focus. Gait is wide-based, fairly stable. Occasional posterior loss of balance while standing. Tends to lean backwards while walking.  Psychiatric: She is pleasant as always.   Assessment/Plan:  1. 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 Multifactorial gait disorder/syncope--overall she has improved   -teds/fluids for bp management 3. Pain Management: tylenol as needed for mobility  4. Continue celexa for depression 5. Fatigue/attention deficits: low dose ritalin  -also recommended supplements  -adequate sleep  Spoke to patient and husband at length regarding her dispo planning. Given her medical history and fall risk, I think she is putting her life in peril by moving back to [place].  Ultimately, it's her decision however.  30 minutes of face to face patient care time were spent during this visit. All questions were encouraged and answered.

## 2014-08-17 DIAGNOSIS — F331 Major depressive disorder, recurrent, moderate: Secondary | ICD-10-CM | POA: Diagnosis not present

## 2014-08-24 DIAGNOSIS — F4320 Adjustment disorder, unspecified: Secondary | ICD-10-CM | POA: Diagnosis not present

## 2014-09-07 DIAGNOSIS — F4320 Adjustment disorder, unspecified: Secondary | ICD-10-CM | POA: Diagnosis not present

## 2014-09-21 ENCOUNTER — Encounter: Payer: Medicare Other | Admitting: Physical Medicine & Rehabilitation

## 2014-10-15 ENCOUNTER — Encounter: Payer: Self-pay | Admitting: Physical Medicine & Rehabilitation

## 2014-10-15 ENCOUNTER — Encounter: Payer: Medicare Other | Attending: Physical Medicine & Rehabilitation | Admitting: Physical Medicine & Rehabilitation

## 2014-10-15 VITALS — BP 116/105 | HR 75 | Resp 14 | Ht 61.0 in | Wt 119.0 lb

## 2014-10-15 DIAGNOSIS — W19XXXS Unspecified fall, sequela: Secondary | ICD-10-CM | POA: Diagnosis not present

## 2014-10-15 DIAGNOSIS — E559 Vitamin D deficiency, unspecified: Secondary | ICD-10-CM | POA: Diagnosis not present

## 2014-10-15 DIAGNOSIS — S0291XS Unspecified fracture of skull, sequela: Secondary | ICD-10-CM | POA: Diagnosis not present

## 2014-10-15 DIAGNOSIS — R2689 Other abnormalities of gait and mobility: Secondary | ICD-10-CM | POA: Insufficient documentation

## 2014-10-15 DIAGNOSIS — E780 Pure hypercholesterolemia: Secondary | ICD-10-CM | POA: Insufficient documentation

## 2014-10-15 DIAGNOSIS — S069X2S Unspecified intracranial injury with loss of consciousness of 31 minutes to 59 minutes, sequela: Secondary | ICD-10-CM | POA: Diagnosis not present

## 2014-10-15 DIAGNOSIS — S069X0S Unspecified intracranial injury without loss of consciousness, sequela: Secondary | ICD-10-CM | POA: Insufficient documentation

## 2014-10-15 DIAGNOSIS — R55 Syncope and collapse: Secondary | ICD-10-CM | POA: Insufficient documentation

## 2014-10-15 DIAGNOSIS — S065X0S Traumatic subdural hemorrhage without loss of consciousness, sequela: Secondary | ICD-10-CM | POA: Diagnosis not present

## 2014-10-15 DIAGNOSIS — K219 Gastro-esophageal reflux disease without esophagitis: Secondary | ICD-10-CM | POA: Insufficient documentation

## 2014-10-15 DIAGNOSIS — F329 Major depressive disorder, single episode, unspecified: Secondary | ICD-10-CM | POA: Insufficient documentation

## 2014-10-15 DIAGNOSIS — R569 Unspecified convulsions: Secondary | ICD-10-CM | POA: Diagnosis not present

## 2014-10-15 DIAGNOSIS — R5383 Other fatigue: Secondary | ICD-10-CM | POA: Insufficient documentation

## 2014-10-15 DIAGNOSIS — Z7409 Other reduced mobility: Secondary | ICD-10-CM | POA: Diagnosis not present

## 2014-10-15 MED ORDER — METHYLPHENIDATE HCL 10 MG PO TABS: 10.0000 mg | ORAL_TABLET | Freq: Two times a day (BID) | ORAL | Status: DC

## 2014-10-15 NOTE — Progress Notes (Signed)
Subjective:    Patient ID: Claudia Perez, female    DOB: 1951-06-06, 63 y.o.   MRN: [id number]  HPI   [name] is back regarding her TBI and her chronic gait disorder. She has been doing her exercises daily. Her pain levels are minimal. She didn't refill the ritalin as she didn't feel much different. She is still lacking with attention and memory at times. Her mood has been good. She reports no crying or problems with depression.  Apparently she tested + for h pylori and was placed on triple abx treatment.   She and her husband are having problems with their relationship and she wants to divorce. Her "move" to [place] is temporarily postponed.      Pain Inventory Average Pain 0 Pain Right Now 0 My pain is weakness  In the last 24 hours, has pain interfered with the following? General activity 1 Relation with others 5 Enjoyment of life 8 What TIME of day is your pain at its worst? evening Sleep (in general) Fair  Pain is worse with: walking, bending, standing and some activites Pain improves with: therapy/exercise Relief from Meds: 6  Mobility walk with assistance use a walker  Function retired I need assistance with the following:  bathing, meal prep, household duties and shopping  Neuro/Psych bladder control problems weakness numbness tingling trouble walking spasms depression  Prior Studies Any changes since last visit?  no  Physicians involved in your care Any changes since last visit?  no   Family History  Problem Relation Age of Onset  . Hypertension Mother   . Cirrhosis Brother    History   Social History  . Marital Status: Married    Spouse Name: N/A    Number of Children: N/A  . Years of Education: N/A   Social History Main Topics  . Smoking status: Never Smoker   . Smokeless tobacco: Never Used  . Alcohol Use: Yes     Comment: 03/27/2013 glass of wine "couple times/yr"  . Drug Use: No  . Sexual Activity: Not Currently   Other  Topics Concern  . None   Social History Narrative   Past Surgical History  Procedure Laterality Date  . Appendectomy  1960's  . Eye surgery  02/2013    cataract extraction w/ intraocular lens implant  . Brain surgery  10/2003    [facility] evac   Past Medical History  Diagnosis Date  . Mobility impaired   . Hypercholesteremia   . Vitamin D deficiency   . Shortness of breath     "even now, just talking" (03/27/2013)  . Hyperthyroidism     "a little bit; don't take RX for it" (03/27/2013)  . GERD (gastroesophageal reflux disease)   . Seizures     "think I might have; been blacking out several times in the past" (03/27/2013)  . Fall at home 03/27/2013    "lost consciousness; fractured right clavicle" (03/27/2013)  . Other closed skull fracture without mention of intracranial injury, unspecified state of consciousness   . Subdural hemorrhage   . Abnormality of gait    BP 116/105 mmHg  Pulse 75  Resp 14  Ht 5\' 1"  (1.549 m)  Wt 119 lb (53.978 kg)  BMI 22.50 kg/m2  SpO2 98%  Opioid Risk Score:   Fall Risk Score: Low Fall Risk (0-5 points) Review of Systems  Genitourinary:       Bladder control  Neurological: Positive for weakness and numbness.  Tingling,spasms  Psychiatric/Behavioral: Positive for decreased concentration.  All other systems reviewed and are negative.      Objective:   Physical Exam  Constitutional: She is oriented to person, place, and time. She appears well-developed and well-nourished. No distress  HENT:  Head: Normocephalic. No tenderness over frontal sinuses  Eyes: Conjunctivae are normal. Pupils are equal, round, and reactive to light.  Neck: Normal range of motion.  Cardiovascular: Normal rate and regular rhythm.  Respiratory: Effort normal and breath sounds normal. No respiratory distress. She has no wheezes.  GI: Soft. Bowel sounds are normal. She exhibits no distension. There is no tenderness.  Musculoskeletal: She exhibits no edema. Right  hip/troch moderately tender to palpation and ROM Neurological: She is alert and oriented to person, place, and time.  Improved attention and focus. Gait is wide-based, fairly stable. Occasional posterior loss of balance while standing which has improved. Still weak in trunk. Takes her time. Does well when changing directions.  Psychiatric: She is pleasant as always.   Assessment/Plan:  1. 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 Multifactorial gait disorder/syncope--overall she continues to improve  -teds/fluids for bp management  -she is doing her best to take her time and exercise. It shows!! 3. Pain Management: tylenol as needed for mobility is preferred to tramadol due to arousal/safety 4. Continue celexa for depression  5. Fatigue/attention deficits: increase ritalin to 10mg  to see if she notices an effect -also recommended supplements  -adequate sleep encouraged again  -consider endocrine work up  15 minutes of face to face patient care time were spent during this visit. All questions were encouraged and answered. i'll see her back in a month

## 2014-10-15 NOTE — Patient Instructions (Signed)
CONTINUE TO PRACTICE GOOD SAFETY AWARENESS AND DO YOUR REGULAR EXERCISES

## 2014-10-19 ENCOUNTER — Emergency Department (HOSPITAL_COMMUNITY): Payer: Medicare Other

## 2014-10-19 ENCOUNTER — Emergency Department (HOSPITAL_COMMUNITY)
Admission: EM | Admit: 2014-10-19 | Discharge: 2014-10-19 | Disposition: A | Discharge status: Home / Self Care | Payer: Medicare Other | Attending: Emergency Medicine | Admitting: Emergency Medicine

## 2014-10-19 ENCOUNTER — Encounter (HOSPITAL_COMMUNITY): Payer: Self-pay | Admitting: *Deleted

## 2014-10-19 DIAGNOSIS — S42202A Unspecified fracture of upper end of left humerus, initial encounter for closed fracture: Secondary | ICD-10-CM

## 2014-10-19 DIAGNOSIS — Z7409 Other reduced mobility: Secondary | ICD-10-CM | POA: Insufficient documentation

## 2014-10-19 DIAGNOSIS — E559 Vitamin D deficiency, unspecified: Secondary | ICD-10-CM | POA: Diagnosis not present

## 2014-10-19 DIAGNOSIS — S42292A Other displaced fracture of upper end of left humerus, initial encounter for closed fracture: Secondary | ICD-10-CM | POA: Diagnosis not present

## 2014-10-19 DIAGNOSIS — Z8679 Personal history of other diseases of the circulatory system: Secondary | ICD-10-CM | POA: Diagnosis not present

## 2014-10-19 DIAGNOSIS — Z79899 Other long term (current) drug therapy: Secondary | ICD-10-CM | POA: Diagnosis not present

## 2014-10-19 DIAGNOSIS — Y998 Other external cause status: Secondary | ICD-10-CM | POA: Diagnosis not present

## 2014-10-19 DIAGNOSIS — S4292XA Fracture of left shoulder girdle, part unspecified, initial encounter for closed fracture: Secondary | ICD-10-CM | POA: Diagnosis not present

## 2014-10-19 DIAGNOSIS — Y9289 Other specified places as the place of occurrence of the external cause: Secondary | ICD-10-CM | POA: Insufficient documentation

## 2014-10-19 DIAGNOSIS — Y9301 Activity, walking, marching and hiking: Secondary | ICD-10-CM | POA: Insufficient documentation

## 2014-10-19 DIAGNOSIS — W19XXXA Unspecified fall, initial encounter: Secondary | ICD-10-CM | POA: Insufficient documentation

## 2014-10-19 DIAGNOSIS — Z8719 Personal history of other diseases of the digestive system: Secondary | ICD-10-CM | POA: Insufficient documentation

## 2014-10-19 DIAGNOSIS — Z8669 Personal history of other diseases of the nervous system and sense organs: Secondary | ICD-10-CM | POA: Insufficient documentation

## 2014-10-19 DIAGNOSIS — S4992XA Unspecified injury of left shoulder and upper arm, initial encounter: Secondary | ICD-10-CM | POA: Diagnosis present

## 2014-10-19 MED ORDER — NAPROXEN 500 MG PO TABS: 500.0000 mg | ORAL_TABLET | Freq: Two times a day (BID) | ORAL | Status: DC

## 2014-10-19 MED ORDER — OXYCODONE-ACETAMINOPHEN 5-325 MG PO TABS: 1.0000 | ORAL_TABLET | ORAL | Status: DC | PRN

## 2014-10-19 MED ORDER — OXYCODONE-ACETAMINOPHEN 5-325 MG PO TABS
2.0000 | ORAL_TABLET | Freq: Once | ORAL | Status: AC
  Administered 2014-10-19: 2 via ORAL
  Filled 2014-10-19: qty 2

## 2014-10-19 NOTE — ED Notes (Signed)
Ortho arrived and is at bedside.

## 2014-10-19 NOTE — ED Notes (Signed)
Pt given disc of shoulder xr.

## 2014-10-19 NOTE — ED Notes (Signed)
Manual script by dr. Sabra Heck given to pt for wheelchair.

## 2014-10-19 NOTE — ED Notes (Signed)
Pt made aware to return if symptoms worsen or if any life threatening symptoms occur.   

## 2014-10-19 NOTE — ED Provider Notes (Signed)
CSN: [id number]     Arrival date & time 10/19/14  1125 History   First MD Initiated Contact with Patient 10/19/14 1220     Chief Complaint  Patient presents with  . Fall  . Shoulder Pain     (Consider location/radiation/quality/duration/timing/severity/associated sxs/prior Treatment) HPI Comments: The patient is a 63 year old female, she has a history of a prior subdural hematoma that required surgery, she has had a chronic unstable gait since that time. She and her husband report that yesterday she was walking with her walker, she had to take her hand off of the walker temporarily, she became unstable and fell over striking her left shoulder on the ground. [name] but has had constant pain in her proximal left upper extremity since that time. She denies any numbness or weakness of the upper extremity and has no injuries to her hip knee ankle and had no head injury or neck pain. She has had tramadol prior to arrival with minimal improvement.  Patient is a 63 y.o. female presenting with fall and shoulder pain. The history is provided by the patient.  Fall  Shoulder Pain   Past Medical History  Diagnosis Date  . Mobility impaired   . Hypercholesteremia   . Vitamin D deficiency   . Shortness of breath     "even now, just talking" (03/27/2013)  . Hyperthyroidism     "a little bit; don't take RX for it" (03/27/2013)  . GERD (gastroesophageal reflux disease)   . Seizures     "think I might have; been blacking out several times in the past" (03/27/2013)  . Fall at home 03/27/2013    "lost consciousness; fractured right clavicle" (03/27/2013)  . Other closed skull fracture without mention of intracranial injury, unspecified state of consciousness   . Subdural hemorrhage   . Abnormality of gait    Past Surgical History  Procedure Laterality Date  . Appendectomy  1960's  . Eye surgery  02/2013    cataract extraction w/ intraocular lens implant  .  Brain surgery  10/2003    [facility] evac   Family History  Problem Relation Age of Onset  . Hypertension Mother   . Cirrhosis Brother    History  Substance Use Topics  . Smoking status: Never Smoker   . Smokeless tobacco: Never Used  . Alcohol Use: Yes     Comment: 03/27/2013 glass of wine "couple times/yr"   OB History    No data available     Review of Systems  All other systems reviewed and are negative.     Allergies  Nitrofurantoin; Phenazopyridine; Sulfamethoxazole-trimethoprim; and Sulfamethoxazole  Home Medications   Prior to Admission medications   Medication Sig Start Date End Date Taking? Authorizing Provider  acetaminophen (TYLENOL) 500 MG tablet Take 1,000 mg by mouth every 6 (six) hours as needed for moderate pain.    Historical Provider, MD  citalopram (CELEXA) 10 MG tablet Take 10 mg by mouth daily.    Historical Provider, MD  ergocalciferol (VITAMIN D2) 50000 UNITS capsule Take 50,000 Units by mouth once a week. On wednesdays    Historical Provider, MD  methylphenidate (RITALIN) 10 MG tablet Take 1 tablet (10 mg total) by mouth 2 (two) times daily with breakfast and lunch. At 0700 and 1200 daily--for attention and arousal 10/15/14   Meredith Staggers, MD  naproxen (NAPROSYN) 500 MG tablet Take 1 tablet (500 mg total) by mouth 2 (two) times  daily with a meal. 10/19/14   Johnna Acosta, MD  oxyCODONE-acetaminophen (PERCOCET) 5-325 MG per tablet Take 1 tablet by mouth every 4 (four) hours as needed. 10/19/14   Johnna Acosta, MD  traMADol (ULTRAM) 50 MG tablet Take 50 mg by mouth every 6 (six) hours as needed for moderate pain.     Historical Provider, MD  Vitamins/Minerals TABS Take by mouth.    Historical Provider, MD   BP 125/60 mmHg  Pulse 83  Temp(Src) 98.2 F (36.8 C) (Oral)  Resp 18  SpO2 98% Physical Exam  Constitutional: She appears well-developed and well-nourished. No distress.  HENT:  Head: Normocephalic and atraumatic.  Mouth/Throat: Oropharynx is  clear and moist. No oropharyngeal exudate.  Eyes: Conjunctivae and EOM are normal. Pupils are equal, round, and reactive to light. Right eye exhibits no discharge. Left eye exhibits no discharge. No scleral icterus.  Neck: Normal range of motion. Neck supple. No JVD present. No thyromegaly present.  Cardiovascular: Normal rate, regular rhythm, normal heart sounds and intact distal pulses.  Exam reveals no gallop and no friction rub.   No murmur heard. Pulmonary/Chest: Effort normal and breath sounds normal. No respiratory distress. She has no wheezes. She has no rales.  Abdominal: Soft. Bowel sounds are normal. She exhibits no distension and no mass. There is no tenderness.  Musculoskeletal: She exhibits tenderness. She exhibits no edema.  Normal range of motion of all major joints except for the left shoulder which is decreased secondary to pain, there is mild deformity and swelling of the upper extremity just distal to the shoulder. Normal range of motion of the elbow and wrist, normal pulses at the left wrist  Lymphadenopathy:    She has no cervical adenopathy.  Neurological: She is alert. Coordination normal.  Normal sensation of the left upper extremity  Skin: Skin is warm and dry. No rash noted. No erythema.  Psychiatric: She has a normal mood and affect. Her behavior is normal.  Nursing note and vitals reviewed.   ED Course  Procedures (including critical care time) Labs Review Labs Reviewed - No data to display  Imaging Review Dg Shoulder Left  10/19/2014   CLINICAL DATA:  63 year old female with fall.  Left arm pain.  EXAM: LEFT SHOULDER - 2+ VIEW  COMPARISON:  Chest x-ray 12/27/2013  FINDINGS: Acute fracture of the left humeral neck, with minimal displacement. There is approximately 6 mm of offset of the cortex on the AP view.  Visualized aspects of the left hemi thorax unremarkable.  Soft tissue swelling present.  IMPRESSION: Acute fracture of the left humeral neck, with minimal  displacement.  Signed,  Dulcy Fanny. Earleen Newport, DO  [facility]  [facility]   Electronically Signed   By: Corrie Mckusick D.O.   On: 10/19/2014 12:15     EKG Interpretation None      MDM   Final diagnoses:  Fracture of proximal humerus, left, closed, initial encounter    The patient has a proximal humeral fracture, vital signs are normal, x-rays reviewed with the patient and her husband, she will follow up with her family doctor and with orthopedics. Sling immobilizer applied, ice pack applied, pain medications as below. This seems to be a mechanical fall, she had no prodromal symptoms, no seizure activity, no dizziness. She does have a chronic unstable gait   Meds given in ED:  Medications  oxyCODONE-acetaminophen (PERCOCET/ROXICET) 5-325 MG per tablet 2 tablet (not administered)    New Prescriptions  NAPROXEN (NAPROSYN) 500 MG TABLET    Take 1 tablet (500 mg total) by mouth 2 (two) times daily with a meal.   OXYCODONE-ACETAMINOPHEN (PERCOCET) 5-325 MG PER TABLET    Take 1 tablet by mouth every 4 (four) hours as needed.        Johnna Acosta, MD 10/19/14 [phone]

## 2014-10-19 NOTE — Discharge Instructions (Signed)
Please call your doctor for a followup appointment within 24-48 hours. When you talk to your doctor please let them know that you were seen in the emergency department and have them acquire all of your records so that they can discuss the findings with you and formulate a treatment plan to fully care for your new and ongoing problems. ° °

## 2014-10-19 NOTE — ED Notes (Signed)
Paged ortho services for small shoulder immobilizer.

## 2014-10-19 NOTE — Progress Notes (Signed)
Orthopedic Tech Progress Note Patient Details:  Claudia Perez [birth year] [id number]  Ortho Devices Type of Ortho Device: Sling immobilizer Ortho Device/Splint Location: lue Ortho Device/Splint Interventions: Application   Claudia Perez 10/19/2014, 1:38 PM

## 2014-10-19 NOTE — ED Notes (Addendum)
Pt reports unsteady gait and fell last night, now has left shoulder pain. Reports frequent falls.

## 2014-10-19 NOTE — ED Notes (Signed)
Pt reports falling last night after unsteady gait and c/o pain to left shoulder. Pt able to move fingers, pulses intact and bruising noted to shoulder.  Pt denies any head injury.

## 2014-10-19 NOTE — ED Notes (Signed)
Called xr to request disc of xr.

## 2014-10-19 NOTE — Progress Notes (Signed)
Orthopedic Tech Progress Note Patient Details:  Claudia Perez October 02, 1951 [id number]  Patient ID: Claudia Perez, female   DOB: 06-01-1951, 63 y.o.   MRN: [id number] Viewed order from doctor's order list  Hildred Priest 10/19/2014, 1:39 PM

## 2014-10-24 DIAGNOSIS — S42202A Unspecified fracture of upper end of left humerus, initial encounter for closed fracture: Secondary | ICD-10-CM | POA: Diagnosis not present

## 2014-11-07 DIAGNOSIS — S42202D Unspecified fracture of upper end of left humerus, subsequent encounter for fracture with routine healing: Secondary | ICD-10-CM | POA: Diagnosis not present

## 2014-11-21 DIAGNOSIS — S42202D Unspecified fracture of upper end of left humerus, subsequent encounter for fracture with routine healing: Secondary | ICD-10-CM | POA: Diagnosis not present

## 2014-12-02 ENCOUNTER — Telehealth: Payer: Self-pay | Admitting: Physical Medicine & Rehabilitation

## 2014-12-04 ENCOUNTER — Ambulatory Visit: Payer: Medicare Other | Attending: Orthopedic Surgery | Admitting: Occupational Therapy

## 2014-12-04 ENCOUNTER — Encounter: Payer: Self-pay | Admitting: Occupational Therapy

## 2014-12-04 DIAGNOSIS — M79602 Pain in left arm: Secondary | ICD-10-CM | POA: Diagnosis not present

## 2014-12-04 DIAGNOSIS — R279 Unspecified lack of coordination: Secondary | ICD-10-CM | POA: Diagnosis not present

## 2014-12-04 DIAGNOSIS — R278 Other lack of coordination: Secondary | ICD-10-CM

## 2014-12-04 DIAGNOSIS — R29898 Other symptoms and signs involving the musculoskeletal system: Secondary | ICD-10-CM | POA: Diagnosis not present

## 2014-12-04 NOTE — Therapy (Signed)
[facility] [address] [place], [place], [address] Phone: [phone]   Fax:  [phone]  Occupational Therapy Evaluation  Patient Details  Name: Claudia Perez MRN: [id number] Date of Birth: 1951-01-11  Encounter Date: 12/04/2014      OT End of Session - 12/04/14 1246    Visit Number 1   Number of Visits 17   Date for OT Re-Evaluation 02/26/15   Authorization Type Medicare part A & B   OT Start Time [phone]   OT Stop Time 1018   OT Time Calculation (min) 36 min   Activity Tolerance Patient tolerated treatment well   Behavior During Therapy [facility] for tasks assessed/performed      Past Medical History  Diagnosis Date  . Mobility impaired   . Hypercholesteremia   . Vitamin D deficiency   . Shortness of breath     "even now, just talking" (03/27/2013)  . Hyperthyroidism     "a little bit; don't take RX for it" (03/27/2013)  . GERD (gastroesophageal reflux disease)   . Seizures     "think I might have; been blacking out several times in the past" (03/27/2013)  . Fall at home 03/27/2013    "lost consciousness; fractured right clavicle" (03/27/2013)  . Other closed skull fracture without mention of intracranial injury, unspecified state of consciousness   . Subdural hemorrhage   . Abnormality of gait     Past Surgical History  Procedure Laterality Date  . Appendectomy  1960's  . Eye surgery  02/2013    cataract extraction w/ intraocular lens implant  . Brain surgery  10/2003    SDH evac    There were no vitals taken for this visit.  Visit Diagnosis:  Weakness of left arm - Plan: Ot plan of care cert/re-cert  Decreased coordination - Plan: Ot plan of care cert/re-cert  Left arm pain - Plan: Ot plan of care cert/re-cert      Subjective Assessment - 12/04/14 1240    Pertinent History Pt reports pervious weakness in LUE prior to her recent fall. She demonstrates significant impairement in both right and left  UE's especially shoulder A/PROM. Pt reports that this has been since last "May or longer" (2015). Refer to [place] as well.          [facility] OT Assessment - 12/04/14 0001    Assessment   Diagnosis L Humerus Fx 10/18/14   Onset Date 10/18/14   Assessment Pt was casted x 4 weeks then sling since then   Prior Therapy Not this injury   Precautions   Precautions Fall  Decreased balance, falls   Required Braces or Orthoses Sling  Presents with sling for comfort   Restrictions   Weight Bearing Restrictions No   Other Position/Activity Restrictions --  Pain w/ AROM, functional use. No restrictions indicated.   Balance Screen   Has the patient fallen in the past 6 months Yes   How many times? --  1x   Has the patient had a decrease in activity level because of a fear of falling?  Yes   Is the patient reluctant to leave their home because of a fear of falling?  Yes   Home  Environment   Family/patient expects to be discharged to: Private residence   Living Arrangements Spouse/significant other   Available Help at Discharge Family   Type of [place] entrance   [place] Two level   Alternate Level Stairs -  Number of Steps --  17 steps to second floor, has lift chair at stairs.   Bathroom Shower/Tub Engineer, manufacturing - 2 wheels;Shower seat;Wheelchair - manual   Lives With Spouse;Family   Prior Function   Level of Independence Needs assistance with ADLs   Level of Independence - Bath Minimal   Dressing Minimal   ADL   Eating/Feeding Minimal assistance   Grooming Modified independent   Upper Body Bathing Left arm   Upper Body Bathing: Patient Percentage 50%  Min A from husband   Lower Body Bathing Minimal assistance   Upper Body Dressing Minimal assistance   Lower Body Dressing Minimal assistance   Toilet Tranfer Minimal assistance   Toileting - Clothing Manipulation Minimal assistance   Where Assessed -  Toileting Clothing Manipulationn Minimal assistance   Tub/Shower Transfer Minimal assistance   ADL comments Pt requires Min A overall for ADL and self care tasks as wel las homemaking and meal prep.   IADL   Prior Level of Function Shopping --  Family performs   Light Housekeeping Needs help with all home maintenance tasks   Meal Prep Needs to have meals prepared and served   Prior Level of Function Community Mobility --  Min A prior to this injury.   Community Mobility Relies on family or friends for Publishing copy financial matters independently (budgets, writes checks, pays rent, bills goes to bank), collects and keeps track of income   Mobility   Mobility Status History of falls   Mobility Status Comments --  Uses RW or manual wc   Written Expression   Dominant Hand Right   Vision - History   Baseline Vision Wears glasses only for reading   Additional Comments No changes from baseline per pt report   Cognition   Overall Cognitive Status Within Functional Limits for tasks assessed   Sensation   Light Touch Appears Intact   Additional Comments WFL's   Coordination   Gross Motor Movements are Fluid and Coordinated No  L UE Impaired, shoulder flex/abd ~80-85* passive.RUE impaired to ~70* noted as well.   Fine Motor Movements are Fluid and Coordinated No   Box and Blocks --  L= 15 R= 25 RUE w/ impairement w/ AROM shoulder and coord   AROM   Overall AROM  Deficits  Deficits bilateral UE's overall shoulders. L w/ pain.    LUE Strength   Left Hand Gross Grasp Functional   RUE Strength   Gross Grasp Functional                       OT Education - 12/04/14 1243    Education Details Pt was instructed to use LUE for functional activities related to ADL's and selfcare as able. She was wearing a sling around her neck & not on her LUE upon arrival to clinic today. She states that MD told her to use her left hand/arm as able and she denies  any restrictions at this time . Pt was educated to remove sling when at home and only use for comfort. She was educated to not use when ambulating w/ RW as this decreases safety. She verbalized understanding of this.   Person(s) Educated Patient   Methods Explanation   Comprehension Verbalized understanding          OT Short Term Goals - 12/04/14 1256    OT SHORT TERM GOAL #1  Title Pt will report decreased pain LUE to 3/10 or less at rest   Baseline 5/10   Time 4   Period Weeks   Status New   OT SHORT TERM GOAL #2   Title Pt will demonstrate ability to don/doff shirt at [place] guard-supervision level using hemi dressing techniques and a/e PRN (LUE).   Baseline Min A   Time 4   Period Weeks   Status New   OT SHORT TERM GOAL #3   Title Pt will be able to use LUE as assist for clothing management during toileting at supervision level   Baseline Min A   Time 4   Period Weeks   Status New   OT SHORT TERM GOAL #4   Title Pt will be Mod I HEP/home program LUE   Time 4   Period Weeks   Status New           OT Long Term Goals - 2014/12/23 1301    OT LONG TERM GOAL #1   Title Pt will be Mod I UB dressing using hemi dressing techniques while seated (LUE)   Time 8   Period Weeks   Status New   OT LONG TERM GOAL #2   Title Pt will be Mod I clothing management related to toileting (don/doff pants) using LUE as assist    Time 8   Period Weeks   Status New   OT LONG TERM GOAL #3   Title Pt will be supervision level for UB bathing techniques seated on tub bench/chair using LUE as assist   Time 8   Period Weeks   Status New   OT LONG TERM GOAL #4   Title Pt will demonstrate increased coordination as seen by improving Box and Blocks score by 10 or more blocks LUE.    Baseline 15   Time 8   Period Weeks   Status New               Plan - December 23, 2014 1249    Clinical Impression Statement Pt is a 64 y/o female s/p L humerus fracture on 10/18/14. She was casted x4 weeks  followed by use of sling and manual w/c PRN since her cast was removed. She demonstrates significant deficits in her ability to perform AROM of bilateral UE's especailly shoulder AROM, however, she reports that she has pain with AROM LUE and not when using her RUE (dominant). Prior to her fall and fracture, she required assistance for ADL's, homemaking and self care tasks, she would like to regain ability to perform basic ADL's and decrease pain LUE as able. She will benefit from out-pt OT to address her current deficits re:ADL's and general functional use.   Rehab Potential Fair   Clinical Impairments Affecting Rehab Potential Pt reports having impaired AROM/bilateral shoulder impairement since "May 2015" requiring assistance w/ ADL's and homemaking activities, h/o balance issues, pain.   OT Frequency 2x / week   OT Duration 8 weeks   OT Treatment/Interventions Self-care/ADL training;Moist Heat;Therapeutic exercise;DME and/or AE instruction;Manual Therapy;Passive range of motion;Therapeutic exercises;Patient/family education;Therapeutic activities   Plan Provide home program for gentle PROM LUE to assist with increased independence w/ ADL's related to bathing, dressing and light functional use as non-dominant UE.   Consulted and Agree with Plan of Care Patient  Pt's husband dropped pt off for assessment and returned when it was over. Not present during eval.          G-Codes - 2014/12/23 1306  Functional Assessment Tool Used Box and Blocks L UE   Functional Limitation Carrying, moving and handling objects   Carrying, Moving and Handling Objects Current Status [phone]) At least 80 percent but less than 100 percent impaired, limited or restricted   Carrying, Moving and Handling Objects Goal Status ([id number]) At least 40 percent but less than 60 percent impaired, limited or restricted      Problem List Patient Active Problem List   Diagnosis Date Noted  . 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 Dystonia 08/10/2014  . Syncope and collapse 06/08/2014  . Orthostatic hypotension 06/08/2014  . 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 Fall down stairs 12/11/2013  . Basilar skull fracture 12/11/2013  . Traumatic intracranial hemorrhage 12/11/2013  . Back pain 12/11/2013  . Subarachnoid hemorrhage following injury 12/09/2013  . Syncope 03/27/2013  . Clavicular fracture 03/27/2013  . COUGH 12/28/2007    Almyra Deforest, OT 12/04/2014, 1:11 PM  [facility] [address] [place], [place], [address] Phone: [phone]   Fax:  [phone]

## 2014-12-07 ENCOUNTER — Encounter: Payer: Medicare Other | Attending: Physical Medicine & Rehabilitation | Admitting: Physical Medicine & Rehabilitation

## 2014-12-07 ENCOUNTER — Encounter: Payer: Self-pay | Admitting: Physical Medicine & Rehabilitation

## 2014-12-07 VITALS — HR 86 | Resp 14

## 2014-12-07 DIAGNOSIS — I951 Orthostatic hypotension: Secondary | ICD-10-CM

## 2014-12-07 DIAGNOSIS — R55 Syncope and collapse: Secondary | ICD-10-CM | POA: Diagnosis not present

## 2014-12-07 DIAGNOSIS — R5383 Other fatigue: Secondary | ICD-10-CM | POA: Diagnosis not present

## 2014-12-07 DIAGNOSIS — S0291XS Unspecified fracture of skull, sequela: Secondary | ICD-10-CM | POA: Insufficient documentation

## 2014-12-07 DIAGNOSIS — K219 Gastro-esophageal reflux disease without esophagitis: Secondary | ICD-10-CM | POA: Insufficient documentation

## 2014-12-07 DIAGNOSIS — S42302A Unspecified fracture of shaft of humerus, left arm, initial encounter for closed fracture: Secondary | ICD-10-CM | POA: Insufficient documentation

## 2014-12-07 DIAGNOSIS — S42302S Unspecified fracture of shaft of humerus, left arm, sequela: Secondary | ICD-10-CM

## 2014-12-07 DIAGNOSIS — S069X0S Unspecified intracranial injury without loss of consciousness, sequela: Secondary | ICD-10-CM | POA: Insufficient documentation

## 2014-12-07 DIAGNOSIS — F329 Major depressive disorder, single episode, unspecified: Secondary | ICD-10-CM | POA: Insufficient documentation

## 2014-12-07 DIAGNOSIS — R5382 Chronic fatigue, unspecified: Secondary | ICD-10-CM

## 2014-12-07 DIAGNOSIS — E780 Pure hypercholesterolemia: Secondary | ICD-10-CM | POA: Insufficient documentation

## 2014-12-07 DIAGNOSIS — S065X0S Traumatic subdural hemorrhage without loss of consciousness, sequela: Secondary | ICD-10-CM | POA: Diagnosis not present

## 2014-12-07 DIAGNOSIS — E559 Vitamin D deficiency, unspecified: Secondary | ICD-10-CM | POA: Diagnosis not present

## 2014-12-07 DIAGNOSIS — S069X2S Unspecified intracranial injury with loss of consciousness of 31 minutes to 59 minutes, sequela: Secondary | ICD-10-CM | POA: Diagnosis not present

## 2014-12-07 DIAGNOSIS — Z7409 Other reduced mobility: Secondary | ICD-10-CM | POA: Diagnosis not present

## 2014-12-07 DIAGNOSIS — R569 Unspecified convulsions: Secondary | ICD-10-CM | POA: Insufficient documentation

## 2014-12-07 DIAGNOSIS — R2689 Other abnormalities of gait and mobility: Secondary | ICD-10-CM | POA: Diagnosis not present

## 2014-12-07 LAB — T4: T4 TOTAL: 8.1 ug/dL (ref 4.5–12.0)

## 2014-12-07 LAB — T4, FREE: FREE T4: 1.36 ng/dL (ref 0.80–1.80)

## 2014-12-07 LAB — T3: T3 TOTAL: 117 ng/dL (ref 80.0–204.0)

## 2014-12-07 LAB — TSH: TSH: 4.561 u[IU]/mL — AB (ref 0.350–4.500)

## 2014-12-07 MED ORDER — METHYLPHENIDATE HCL 10 MG PO TABS: 10.0000 mg | ORAL_TABLET | Freq: Two times a day (BID) | ORAL | Status: DC

## 2014-12-07 MED ORDER — TRAMADOL HCL 50 MG PO TABS: 50.0000 mg | ORAL_TABLET | Freq: Two times a day (BID) | ORAL | Status: DC | PRN

## 2014-12-07 NOTE — Progress Notes (Signed)
Subjective:    Patient ID: Claudia Perez, female    DOB: [birth year], 64 y.o.   MRN: [id number]  HPI   Claudia Perez is here in follow up of her TBI and gait disorder.  Claudia Perez fell in November and broke her humeral neck. Claudia Perez was in a sling and ?cast for four weeks. Claudia Perez is struggling with ROM at the left shoulder .Claudia Perez has therapy scheduled to start on Monday.  Claudia Perez has pain in the left shoulder with movement. Claudia Perez is back on tramadol for pain control. The percocet was too strong!  Pain Inventory Average Pain 6 Pain Right Now 6 My pain is constant, dull and aching  In the last 24 hours, has pain interfered with the following? General activity 7 Relation with others 7 Enjoyment of life 8 What TIME of day is your pain at its worst? evening Sleep (in general) Fair  Pain is worse with: walking, bending, standing and some activites Pain improves with: rest and medication Relief from Meds: 4  Mobility use a walker ability to climb steps?  no do you drive?  no  Function retired  Neuro/Psych bladder control problems bowel control problems weakness tingling trouble walking dizziness loss of taste or smell  Prior Studies Any changes since last visit?  no  Physicians involved in your care Any changes since last visit?  no   Family History  Problem Relation Age of Onset  . Hypertension Mother   . Cirrhosis Brother    History   Social History  . Marital Status: Married    Spouse Name: N/A    Number of Children: N/A  . Years of Education: N/A   Social History Main Topics  . Smoking status: Never Smoker   . Smokeless tobacco: Never Used  . Alcohol Use: Yes     Comment: 03/27/2013 glass of wine "couple times/yr"  . Drug Use: No  . Sexual Activity: Not Currently   Other Topics Concern  . None   Social History Narrative   Past Surgical History  Procedure Laterality Date  . Appendectomy  1960's  . Eye surgery  02/2013    cataract extraction w/ intraocular lens implant    . Brain surgery  10/2003    SDH evac   Past Medical History  Diagnosis Date  . Mobility impaired   . Hypercholesteremia   . Vitamin D deficiency   . Shortness of breath     "even now, just talking" (03/27/2013)  . Hyperthyroidism     "a little bit; don't take RX for it" (03/27/2013)  . GERD (gastroesophageal reflux disease)   . Seizures     "think I might have; been blacking out several times in the past" (03/27/2013)  . Fall at home 03/27/2013    "lost consciousness; fractured right clavicle" (03/27/2013)  . Other closed skull fracture without mention of intracranial injury, unspecified state of consciousness   . Subdural hemorrhage   . Abnormality of gait    Pulse 86  Resp 14  SpO2 99%  Opioid Risk Score:   Fall Risk Score: High Fall Risk (>13 points)   Review of Systems  HENT: Negative.   Eyes: Negative.   Respiratory: Negative.   Cardiovascular: Negative.   Gastrointestinal:       Bowel control problems  Endocrine: Negative.   Genitourinary: Negative.        Bladder control problems  Musculoskeletal: Positive for myalgias, back pain and arthralgias.  Allergic/Immunologic: Negative.   Neurological: Positive for weakness.  Trouble walking  Hematological: Negative.   Psychiatric/Behavioral: Negative.        Objective:   Physical Exam  Constitutional: Claudia Perez is oriented to person, place, and time. Claudia Perez appears well-developed and well-nourished. No distress  HENT:  Head: Normocephalic. No tenderness over frontal sinuses  Eyes: Conjunctivae are normal. Pupils are equal, round, and reactive to light.  Neck: Normal range of motion.  Cardiovascular: Normal rate and regular rhythm.  Respiratory: Effort normal and breath sounds normal. No respiratory distress. Claudia Perez has no wheezes.  GI: Soft. Bowel sounds are normal. Claudia Perez exhibits no distension. There is no tenderness.  Musculoskeletal: Claudia Perez exhibits no edema. Left shoulder tight but I could passively range her to near  80-90 degrees. Joint capsule is tight. Pecs,lats, teres maj/minor fairly loose. Mild pain with PROM.  Neurological: Claudia Perez is alert and oriented to person, place, and time.  Improved attention and focus. Gait is wide-based, fairly stable.   Still weak in trunk. Takes her time. Does well when changing directions.  Psychiatric: Claudia Perez is pleasant as always.    Assessment/Plan:  1. 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 Multifactorial gait disorder/syncope--overall Claudia Perez continues to improve  -teds/fluids for bp management  3. Left humeral neck fracture  -therapy for ROM.  Fall prevention needs to be re-dressed.    -HEP  -tramadol prn for more severe pain.   4. Continue celexa for depression  5. Fatigue/attention deficits:  -ritalin refilled  -adequate sleep encouraged again  -will check thyroid panel today. 15 minutes of face to face patient care time were spent during this visit. All questions were encouraged and answered. i'll see her back in a month

## 2014-12-07 NOTE — Patient Instructions (Signed)
Take your time and be careful!!!!

## 2014-12-10 ENCOUNTER — Ambulatory Visit: Payer: Medicare Other | Admitting: Occupational Therapy

## 2014-12-10 ENCOUNTER — Encounter: Payer: Self-pay | Admitting: Occupational Therapy

## 2014-12-10 DIAGNOSIS — M79602 Pain in left arm: Secondary | ICD-10-CM | POA: Diagnosis not present

## 2014-12-10 DIAGNOSIS — R29898 Other symptoms and signs involving the musculoskeletal system: Secondary | ICD-10-CM | POA: Diagnosis not present

## 2014-12-10 DIAGNOSIS — R278 Other lack of coordination: Secondary | ICD-10-CM

## 2014-12-10 DIAGNOSIS — R279 Unspecified lack of coordination: Secondary | ICD-10-CM | POA: Diagnosis not present

## 2014-12-10 DIAGNOSIS — R270 Ataxia, unspecified: Secondary | ICD-10-CM

## 2014-12-10 NOTE — Therapy (Signed)
[facility] [address] [place], [place], [address] Phone: [phone]   Fax:  [phone]  Occupational Therapy Treatment  Patient Details  Name: Claudia Perez MRN: [id number] Date of Birth: Mar 05, 1951 Referring Provider:  Randa Lynn*  Encounter Date: 12/10/2014      OT End of Session - 12/10/14 1707    Visit Number 2   Number of Visits 17   Date for OT Re-Evaluation 02/26/15   Authorization Type Medicare part A & B   OT Start Time 1445   OT Stop Time 1535   OT Time Calculation (min) 50 min   Activity Tolerance Patient tolerated treatment well   Behavior During Therapy [facility] for tasks assessed/performed      Past Medical History  Diagnosis Date  . Mobility impaired   . Hypercholesteremia   . Vitamin D deficiency   . Shortness of breath     "even now, just talking" (03/27/2013)  . Hyperthyroidism     "a little bit; don't take RX for it" (03/27/2013)  . GERD (gastroesophageal reflux disease)   . Seizures     "think I might have; been blacking out several times in the past" (03/27/2013)  . Fall at home 03/27/2013    "lost consciousness; fractured right clavicle" (03/27/2013)  . Other closed skull fracture without mention of intracranial injury, unspecified state of consciousness   . Subdural hemorrhage   . Abnormality of gait     Past Surgical History  Procedure Laterality Date  . Appendectomy  1960's  . Eye surgery  02/2013    cataract extraction w/ intraocular lens implant  . Brain surgery  10/2003    SDH evac    There were no vitals taken for this visit.  Visit Diagnosis:  Weakness of left arm  Decreased coordination  Left arm pain  Ataxia      Subjective Assessment - 12/10/14 1656    Symptoms Pt is a 64 y/o RHD female whom fell on 10/18/14 when she sustained a left humerus fracture. She was plcaed in an above elbow cast x4 weeks and then used a sling after that. She presents today  per Dr. Percell Miller for Eval and treatment. She states  that she has no restrictions at this time and "that Dr Percell Miller said to use it for everything, do every day things"   Pertinent History Pt reports pervious weakness in LUE prior to her recent fall. She demonstrates significant impairement in both right and left UE's especially shoulder A/PROM. Pt reports that this has been since last "May or longer" (2015). Refer to [place] as well.   Limitations Decreased ability to grade muscle activity   Patient Stated Goals Pt wishes to increase motion "get better motion" L UE   Currently in Pain? Yes   Pain Score 5    Pain Location Shoulder   Pain Orientation Left   Pain Descriptors / Indicators Aching   Pain Type Chronic pain                 OT Treatments/Exercises (OP) - 12/10/14 0001    ADLs   UB Dressing Patient requires moderate assistance to don trench coat   Functional Mobility Unable to transition from supine to sitting, or sidelying to sidesitting without moderate assistance   Shoulder Exercises: Supine   Protraction PROM;AAROM;Left  supine   External Rotation PROM;AAROM;Left;5 reps;Strengthening   Internal Rotation PROM;AAROM;Strengthening;Left;5 reps   Flexion PROM;AAROM;Left;5 reps  supine   ABduction PROM;AAROM;Left;5 reps  supine   Shoulder Exercises: Seated   Elevation AROM;Strengthening;5 reps   Shoulder Exercises: Standing   Horizontal ABduction AAROM;Left;5 reps  with support of countertop   Flexion AAROM;Left;5 reps  Support of countertop   Neurological Re-education Exercises   Scapular Stabilization Right;Left;5 reps;Supine;Seated   Shoulder Flexion AAROM;Left;5 reps;Seated  guided movements with cueing to grade accurate muscle activi   Weight Bearing Technique   Weight Bearing Technique Yes  weight bearing through left forearm in sidelying and sitting                OT Education - 12/10/14 1707    Education provided Yes   Person(s) Educated Patient    Methods Explanation;Demonstration   Comprehension Verbalized understanding          OT Short Term Goals - 12/04/14 1256    OT SHORT TERM GOAL #1   Title Pt will report decreased pain LUE to 3/10 or less at rest   Baseline 5/10   Time 4   Period Weeks   Status New   OT SHORT TERM GOAL #2   Title Pt will demonstrate ability to don/doff shirt at [place] guard-supervision level using hemi dressing techniques and a/e PRN (LUE).   Baseline Min A   Time 4   Period Weeks   Status New   OT SHORT TERM GOAL #3   Title Pt will be able to use LUE as assist for clothing management during toileting at supervision level   Baseline Min A   Time 4   Period Weeks   Status New   OT SHORT TERM GOAL #4   Title Pt will be Mod I HEP/home program LUE   Time 4   Period Weeks   Status New           OT Long Term Goals - 12/04/14 1301    OT LONG TERM GOAL #1   Title Pt will be Mod I UB dressing using hemi dressing techniques while seated (LUE)   Time 8   Period Weeks   Status New   OT LONG TERM GOAL #2   Title Pt will be Mod I clothing management related to toileting (don/doff pants) using LUE as assist    Time 8   Period Weeks   Status New   OT LONG TERM GOAL #3   Title Pt will be supervision level for UB bathing techniques seated on tub bench/chair using LUE as assist   Time 8   Period Weeks   Status New   OT LONG TERM GOAL #4   Title Pt will demonstrate increased coordination as seen by improving Box and Blocks score by 10 or more blocks LUE.    Baseline 15   Time 8   Period Weeks   Status New               Plan - 12/10/14 1707    Clinical Impression Statement Patient will benefit from continued skilled OT intervention to improve active movement, strength, and functional ability, and to decrease pain in left upper extremity following recent humeral fracture.  Patient will benefit from neuromuscular re-education for whole body to improve coordination, timing and sequencing, and  ability to grade muscle activity to improve overall safety with functional mobility.     Rehab Potential Good   Clinical Impairments Affecting Rehab Potential Pt reports having impaired AROM/bilateral shoulder impairement since "May 2015" requiring assistance w/ ADL's and homemaking activities, h/o balance issues, pain.   OT Frequency 2x /  week   OT Duration 8 weeks   OT Treatment/Interventions Self-care/ADL training;Moist Heat;Therapeutic exercise;DME and/or AE instruction;Manual Therapy;Passive range of motion;Therapeutic exercises;Patient/family education;Therapeutic activities;Neuromuscular education;Balance training;Functional Mobility Training   Plan Continue to build motor control and strength in left UE progressing from gravity eliminated to against gravity.  Needs neuromuscular re-education to learn to move left UE separately from body to increase function and safety   OT Home Exercise Plan Initiated plan for patient to roll onto left side in bed, and use left arm to help get up from sidelying   Consulted and Agree with Plan of Care Patient        Problem List Patient Active Problem List   Diagnosis Date Noted  . Closed left humeral fracture 12/07/2014  . Fatigue 12/07/2014  . 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 Dystonia 08/10/2014  . Syncope and collapse 06/08/2014  . Orthostatic hypotension 06/08/2014  . 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 Fall down stairs 12/11/2013  . Basilar skull fracture 12/11/2013  . Traumatic intracranial hemorrhage 12/11/2013  . Back pain 12/11/2013  . Subarachnoid hemorrhage following injury 12/09/2013  . Syncope 03/27/2013  . Clavicular fracture 03/27/2013  . COUGH 12/28/2007   Antony Salmon, OTR/L 12/10/2014 5:15 PM Phone: [phone] Fax: [phone]   [facility] [address] [place], [place], [address] Phone: [phone]   Fax:  [phone]

## 2014-12-10 NOTE — Patient Instructions (Signed)
Encouraged patient to roll onto left side in bed, and encouraged her to use her left arm to push up from sidelying when getting out of bed.

## 2014-12-11 NOTE — Telephone Encounter (Signed)
Please let Claudia Perez know that her thyroid tests were acceptable/normal---no changes---thanks

## 2014-12-12 ENCOUNTER — Ambulatory Visit: Payer: Medicare Other | Admitting: Occupational Therapy

## 2014-12-12 NOTE — Telephone Encounter (Signed)
Called patient and left message for her to return call about labs.

## 2014-12-17 ENCOUNTER — Encounter: Payer: Self-pay | Admitting: Occupational Therapy

## 2014-12-17 ENCOUNTER — Ambulatory Visit: Payer: Medicare Other | Admitting: Occupational Therapy

## 2014-12-17 DIAGNOSIS — M79602 Pain in left arm: Secondary | ICD-10-CM

## 2014-12-17 DIAGNOSIS — R278 Other lack of coordination: Secondary | ICD-10-CM

## 2014-12-17 DIAGNOSIS — R270 Ataxia, unspecified: Secondary | ICD-10-CM

## 2014-12-17 DIAGNOSIS — R29898 Other symptoms and signs involving the musculoskeletal system: Secondary | ICD-10-CM | POA: Diagnosis not present

## 2014-12-17 DIAGNOSIS — R279 Unspecified lack of coordination: Secondary | ICD-10-CM | POA: Diagnosis not present

## 2014-12-17 NOTE — Therapy (Signed)
[facility] [address] [place], [place], [address] Phone: [phone]   Fax:  [phone]  Occupational Therapy Treatment  Patient Details  Name: Claudia Perez MRN: [id number] Date of Birth: [birth year] Referring Provider:  Randa Lynn*  Encounter Date: 12/17/2014      OT End of Session - 12/17/14 1638    Visit Number 3   Number of Visits 17   Date for OT Re-Evaluation 02/26/15   Authorization Type Medicare part A & B   OT Start Time 1445   OT Stop Time 1530   OT Time Calculation (min) 45 min   Activity Tolerance Patient tolerated treatment well   Behavior During Therapy [facility] for tasks assessed/performed      Past Medical History  Diagnosis Date  . Mobility impaired   . Hypercholesteremia   . Vitamin D deficiency   . Shortness of breath     "even now, just talking" (03/27/2013)  . Hyperthyroidism     "a little bit; don't take RX for it" (03/27/2013)  . GERD (gastroesophageal reflux disease)   . Seizures     "think I might have; been blacking out several times in the past" (03/27/2013)  . Fall at home 03/27/2013    "lost consciousness; fractured right clavicle" (03/27/2013)  . Other closed skull fracture without mention of intracranial injury, unspecified state of consciousness   . Subdural hemorrhage   . Abnormality of gait     Past Surgical History  Procedure Laterality Date  . Appendectomy  1960's  . Eye surgery  02/2013    cataract extraction w/ intraocular lens implant  . Brain surgery  10/2003    SDH evac    There were no vitals taken for this visit.  Visit Diagnosis:  Weakness of left arm  Decreased coordination  Left arm pain  Ataxia      Subjective Assessment - 12/17/14 1631    Symptoms left shoulder pain, proximal greater than distal weakness, muscle imbalance,    Pertinent History see snapshot   Limitations Decreased ability to grade muscle activity   Patient Stated  Goals Pt wishes to increase motion "get better motion" L UE   Currently in Pain? Yes   Pain Score 3    Pain Location Shoulder   Pain Orientation Left   Pain Descriptors / Indicators Aching   Pain Type Chronic pain   Pain Onset More than a month ago   Pain Frequency Intermittent   Aggravating Factors  AROM and functional use   Pain Relieving Factors pain medication, positioning, rest                 OT Treatments/Exercises (OP) - 12/17/14 0001    Shoulder Exercises: Supine   External Rotation PROM;AAROM;Left;5 reps   Internal Rotation AAROM;Left;5 reps   Flexion AAROM;10 reps;PROM   ABduction PROM;AAROM;Left;10 reps   Neurological Re-education Exercises   Elbow Flexion AAROM;AROM;Strengthening;10 reps;Bar weights/barbell;Supine  1 lb   Elbow Extension AROM;AAROM;Left;10 reps;Supine;Bar weights/barbell  1 lb   Other Exercises 1 table slides forward, lateral, circular   Other Weight-Bearing Exercises 1 sit to sidelying   Other Weight-Bearing Exercises 2 sidelying to sit   Weight Bearing Technique   Weight Bearing Technique Yes   Manual Therapy   Manual Therapy Myofascial release;Joint mobilization                OT Education - 12/17/14 1637    Education provided Yes   Education Details Patient instructed in  table slides with left upper extremity.  Patient encouraged to use left UE for functional activity   Person(s) Educated Patient   Methods Explanation;Demonstration   Comprehension Verbalized understanding          OT Short Term Goals - 12/17/14 1644    OT SHORT TERM GOAL #1   Title Pt will report decreased pain LUE to 3/10 or less at rest   Baseline 5/10   Time 4   Period Weeks   Status New   OT SHORT TERM GOAL #2   Title Pt will demonstrate ability to don/doff shirt at [place] guard-supervision level using hemi dressing techniques and a/e PRN (LUE).   Baseline Min A   Time 4   Period Weeks   Status New   OT SHORT TERM GOAL #3   Title Pt will  be able to use LUE as assist for clothing management during toileting at supervision level   Baseline Min A   Time 4   Period Weeks   Status New   OT SHORT TERM GOAL #4   Title Pt will be Mod I HEP/home program LUE   Time 4   Period Weeks   Status New           OT Long Term Goals - 12/17/14 1645    OT LONG TERM GOAL #1   Title Pt will be Mod I UB dressing using hemi dressing techniques while seated (LUE)   Time 8   Period Weeks   Status New   OT LONG TERM GOAL #2   Title Pt will be Mod I clothing management related to toileting (don/doff pants) using LUE as assist    Time 8   Period Weeks   Status New   OT LONG TERM GOAL #3   Title Pt will be supervision level for UB bathing techniques seated on tub bench/chair using LUE as assist   Time 8   Period Weeks   Status New   OT LONG TERM GOAL #4   Title Pt will demonstrate increased coordination as seen by improving Box and Blocks score by 10 or more blocks LUE.    Baseline 15   Time 8   Period Weeks   Status New               Plan - 12/17/14 1638    Clinical Impression Statement Patient will benefit from continued skilled OT intervention to increase active range of motion, strength and coordination in left upper extremity.  Patient is reporting decreased pain in left upper extremity.   Rehab Potential Good   Clinical Impairments Affecting Rehab Potential Pt reports having impaired AROM/bilateral shoulder impairement since "May 2015" requiring assistance w/ ADL's and homemaking activities, h/o balance issues, pain, also dysmetria, choreatic type movement which impeded controlled functional use of UE's.     OT Frequency 2x / week   OT Duration 8 weeks   OT Treatment/Interventions Self-care/ADL training;Moist Heat;Therapeutic exercise;DME and/or AE instruction;Manual Therapy;Passive range of motion;Therapeutic exercises;Patient/family education;Therapeutic activities;Neuromuscular education;Balance training;Functional  Mobility Training   Plan Continue to improve AAROM to AROM and strength in left UE, improve neuromuscular control especially for shoulder stability   OT Home Exercise Plan Initiated plan for patient to roll onto left side in bed, and use left arm to help get up from sidelying, added table slides   Consulted and Agree with Plan of Care Patient        Problem List Patient Active Problem List   Diagnosis Date  Noted  . Closed left humeral fracture 12/07/2014  . Fatigue 12/07/2014  . 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 Dystonia 08/10/2014  . Syncope and collapse 06/08/2014  . Orthostatic hypotension 06/08/2014  . 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 Fall down stairs 12/11/2013  . Basilar skull fracture 12/11/2013  . Traumatic intracranial hemorrhage 12/11/2013  . Back pain 12/11/2013  . Subarachnoid hemorrhage following injury 12/09/2013  . Syncope 03/27/2013  . Clavicular fracture 03/27/2013  . COUGH 12/28/2007   Antony Salmon, OTR/L 12/17/14  4:45 PM Phone: [phone] Fax: [phone]   [facility] [address] [place], [place], [address] Phone: [phone]   Fax:  [phone]

## 2014-12-21 ENCOUNTER — Ambulatory Visit: Payer: Medicare Other | Admitting: Occupational Therapy

## 2014-12-24 ENCOUNTER — Ambulatory Visit: Payer: Medicare Other | Admitting: Occupational Therapy

## 2014-12-24 ENCOUNTER — Encounter: Payer: Self-pay | Admitting: Occupational Therapy

## 2014-12-24 DIAGNOSIS — R270 Ataxia, unspecified: Secondary | ICD-10-CM

## 2014-12-24 DIAGNOSIS — R29898 Other symptoms and signs involving the musculoskeletal system: Secondary | ICD-10-CM

## 2014-12-24 DIAGNOSIS — M79602 Pain in left arm: Secondary | ICD-10-CM | POA: Diagnosis not present

## 2014-12-24 DIAGNOSIS — R278 Other lack of coordination: Secondary | ICD-10-CM

## 2014-12-24 DIAGNOSIS — R279 Unspecified lack of coordination: Secondary | ICD-10-CM | POA: Diagnosis not present

## 2014-12-24 NOTE — Therapy (Signed)
[facility] [address] [place], [place], [address] Phone: [phone]   Fax:  [phone]  Occupational Therapy Treatment  Patient Details  Name: Claudia Perez MRN: [id number] Date of Birth: 28-Jan-1951 Referring Provider:  Randa Lynn*  Encounter Date: 12/24/2014      OT End of Session - 12/24/14 1557    Visit Number 4   Number of Visits 17   Date for OT Re-Evaluation 02/26/15   Authorization Type Medicare part A & B   OT Start Time 1445   OT Stop Time 1533   OT Time Calculation (min) 48 min   Activity Tolerance Patient tolerated treatment well   Behavior During Therapy [facility] for tasks assessed/performed      Past Medical History  Diagnosis Date  . Mobility impaired   . Hypercholesteremia   . Vitamin D deficiency   . Shortness of breath     "even now, just talking" (03/27/2013)  . Hyperthyroidism     "a little bit; don't take RX for it" (03/27/2013)  . GERD (gastroesophageal reflux disease)   . Seizures     "think I might have; been blacking out several times in the past" (03/27/2013)  . Fall at home 03/27/2013    "lost consciousness; fractured right clavicle" (03/27/2013)  . Other closed skull fracture without mention of intracranial injury, unspecified state of consciousness   . Subdural hemorrhage   . Abnormality of gait     Past Surgical History  Procedure Laterality Date  . Appendectomy  1960's  . Eye surgery  02/2013    cataract extraction w/ intraocular lens implant  . Brain surgery  10/2003    SDH evac    There were no vitals taken for this visit.  Visit Diagnosis:  Weakness of left arm  Decreased coordination  Left arm pain  Ataxia      Subjective Assessment - 12/24/14 1546    Symptoms My pain is like a zero or a 1.  I take a tramadol when I am going to therapy   Pertinent History see snapshot   Limitations Decreased ability to grade muscle activity   Patient Stated Goals  Pt wishes to increase motion "get better motion" L UE                 OT Treatments/Exercises (OP) - 12/24/14 0001    ADLs   UB Dressing Moderate assistance to don trench coat in sitting.  Uanble to safely complete in satnding today   LB Dressing Patient reports improved ability to pull up pants using bilateral upper extremities.     Shoulder Exercises: Supine   Protraction AAROM;AROM;Both;10 reps   External Rotation AAROM;AROM;Left;5 reps   Internal Rotation AROM;Left;5 reps   Flexion AAROM;AROM;Left;Both;10 reps   Neurological Re-education Exercises   Elbow Flexion AROM;Left;10 reps;Seated  through 75%range   Elbow Extension AROM;Left;10 reps;Seated   Other Exercises 1 Neuromuscular reeducation to address isolated control in left upper extremity.  Working off PVC square, with emphasis on forced but appropriate activation of left shoulder flexion with elbow extension.  Supine, place and hold activity, and scapular stabilization    Closed Chain Exercises with forearm support, practiced shoulder flexion / extension, abduction / adduction in sitting                OT Education - 12/24/14 1556    Education provided Yes   Education Details Elbow flexion and extension without overactivation of neck extension   Person(s) Educated  Patient   Methods Explanation;Demonstration   Comprehension Verbalized understanding;Returned demonstration          OT Short Term Goals - 12/24/14 1600    OT SHORT TERM GOAL #1   Title Pt will report decreased pain LUE to 3/10 or less at rest   Status On-going   OT SHORT TERM GOAL #2   Title Pt will demonstrate ability to don/doff shirt at [place] guard-supervision level using hemi dressing techniques and a/e PRN (LUE).   Status On-going   OT SHORT TERM GOAL #3   Title Pt will be able to use LUE as assist for clothing management during toileting at supervision level   Status On-going   OT SHORT TERM GOAL #4   Title Pt will be Mod I  HEP/home program LUE   Status On-going           OT Long Term Goals - 12/24/14 1600    OT LONG TERM GOAL #1   Title Pt will be Mod I UB dressing using hemi dressing techniques while seated (LUE)   Status On-going   OT LONG TERM GOAL #2   Title Pt will be Mod I clothing management related to toileting (don/doff pants) using LUE as assist    Status On-going   OT LONG TERM GOAL #3   Title Pt will be supervision level for UB bathing techniques seated on tub bench/chair using LUE as assist   Status On-going   OT LONG TERM GOAL #4   Title Pt will demonstrate increased coordination as seen by improving Box and Blocks score by 10 or more blocks LUE.    Status On-going               Plan - 12/24/14 1558    Clinical Impression Statement Patient will benefit from continued skilled OT intervention to continue to decrease pain, improve active range of motion, coordination, and functional use of left upper extremity   Rehab Potential Good   Clinical Impairments Affecting Rehab Potential Pt reports having impaired AROM/bilateral shoulder impairement since "May 2015" requiring assistance w/ ADL's and homemaking activities, h/o balance issues, pain, also dysmetria, choreatic type movement which impeded controlled functional use of UE's.     OT Frequency 2x / week   OT Duration 8 weeks   OT Treatment/Interventions Self-care/ADL training;Moist Heat;Therapeutic exercise;DME and/or AE instruction;Manual Therapy;Passive range of motion;Therapeutic exercises;Patient/family education;Therapeutic activities;Neuromuscular education;Balance training;Functional Mobility Training   Plan Supine for UE conditioning, place and hold, repetition of shoulder flex with elbow extension with correct muscle recruitment, upright assisted motion to improve reach low to mid range   Consulted and Agree with Plan of Care Patient        Problem List Patient Active Problem List   Diagnosis Date Noted  . Closed  left humeral fracture 12/07/2014  . Fatigue 12/07/2014  . 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 Dystonia 08/10/2014  . Syncope and collapse 06/08/2014  . Orthostatic hypotension 06/08/2014  . 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 Fall down stairs 12/11/2013  . Basilar skull fracture 12/11/2013  . Traumatic intracranial hemorrhage 12/11/2013  . Back pain 12/11/2013  . Subarachnoid hemorrhage following injury 12/09/2013  . Syncope 03/27/2013  . Clavicular fracture 03/27/2013  . COUGH 12/28/2007    Mariah Milling 12/24/2014, 4:01 PM  [facility] [phone]  [address] [place], [place], [address] Phone: [phone]   Fax:  [phone]    

## 2014-12-24 NOTE — Patient Instructions (Signed)
Elbow bending and straightening 1)  While seated, look at your left hand. 2)  Keep looking at your left hand while you bend and straighten your elbow 3) Make sure to keep thumb up 4)  Complete this exercise 10 times, repeat this at least 3 times each day.

## 2014-12-28 ENCOUNTER — Encounter: Payer: Self-pay | Admitting: Occupational Therapy

## 2014-12-28 ENCOUNTER — Ambulatory Visit: Payer: Medicare Other | Admitting: Occupational Therapy

## 2014-12-28 DIAGNOSIS — M79602 Pain in left arm: Secondary | ICD-10-CM | POA: Diagnosis not present

## 2014-12-28 DIAGNOSIS — R270 Ataxia, unspecified: Secondary | ICD-10-CM

## 2014-12-28 DIAGNOSIS — R29898 Other symptoms and signs involving the musculoskeletal system: Secondary | ICD-10-CM

## 2014-12-28 DIAGNOSIS — R279 Unspecified lack of coordination: Secondary | ICD-10-CM

## 2014-12-28 DIAGNOSIS — R278 Other lack of coordination: Secondary | ICD-10-CM

## 2014-12-28 NOTE — Patient Instructions (Signed)
Supine chest press. Bilateral upper extremities holding light dowel or cane Press up 5 times rest  repeat 4 more times.   Ideally press up and hold for count of 3  Control press up and pull back down.  Do not allow arm to fall back onto the bed.

## 2014-12-28 NOTE — Therapy (Signed)
[facility] [address] [place], [place], [address] Phone: [phone]   Fax:  [phone]  Occupational Therapy Treatment  Patient Details  Name: Claudia Perez MRN: [id number] Date of Birth: 04-01-1951 Referring Provider:  Randa Lynn*  Encounter Date: 12/28/2014      OT End of Session - 12/28/14 1625    Visit Number 5   Number of Visits 17   Date for OT Re-Evaluation 02/26/15   Authorization Type Medicare part A & B   OT Start Time 1450   OT Stop Time 1535   OT Time Calculation (min) 45 min   Equipment Utilized During Treatment UBE   Activity Tolerance Patient tolerated treatment well   Behavior During Therapy [facility] for tasks assessed/performed      Past Medical History  Diagnosis Date  . Mobility impaired   . Hypercholesteremia   . Vitamin D deficiency   . Shortness of breath     "even now, just talking" (03/27/2013)  . Hyperthyroidism     "a little bit; don't take RX for it" (03/27/2013)  . GERD (gastroesophageal reflux disease)   . Seizures     "think I might have; been blacking out several times in the past" (03/27/2013)  . Fall at home 03/27/2013    "lost consciousness; fractured right clavicle" (03/27/2013)  . Other closed skull fracture without mention of intracranial injury, unspecified state of consciousness   . Subdural hemorrhage   . Abnormality of gait     Past Surgical History  Procedure Laterality Date  . Appendectomy  1960's  . Eye surgery  02/2013    cataract extraction w/ intraocular lens implant  . Brain surgery  10/2003    SDH evac    There were no vitals taken for this visit.  Visit Diagnosis:  Left arm pain  Ataxia  Decreased coordination  Weakness of left arm      Subjective Assessment - 12/28/14 1617    Symptoms Almost no pain.  I did not take Tramadol today - I wanted to see how it would feel   Pertinent History see snapshot   Limitations Decreased ability  to grade muscle activity   Patient Stated Goals Pt wishes to increase motion "get better motion" L UE   Currently in Pain? Yes   Pain Score 1    Pain Location Shoulder   Pain Orientation Left   Pain Descriptors / Indicators Aching   Pain Type Chronic pain                 OT Treatments/Exercises (OP) - 12/28/14 0001    Shoulder Exercises: Supine   Flexion AROM;AAROM;Strengthening;Both;5 reps   Other Supine Exercises beginning place and hold shoulder flexion with elbow extension - momentary   Other Supine Exercises Chest press with bar - no additional weight with emphasis on controlled movement in both directions - able to sustain flexed shoulder position for 1-3 seconds   Shoulder Exercises: Seated   Other Seated Exercises Able to utilize bilateral upper extremities for UBE x 56min at level 1.6 .  Able to manually turn pedal with only left hand through two complete revolutions   Other Seated Exercises Seated with extension pole to support weight of arm to promote supported forward reaching patterns, as wella s increasing active (supported) abduction in left shoulder                OT Education - 12/28/14 1624    Education provided Yes  Education Details chest press added to  home program   Person(s) Educated Patient   Methods Explanation;Demonstration;Verbal cues;Tactile cues   Comprehension Verbalized understanding;Returned demonstration          OT Short Term Goals - 12/24/14 1600    OT SHORT TERM GOAL #1   Title Pt will report decreased pain LUE to 3/10 or less at rest   Status On-going   OT SHORT TERM GOAL #2   Title Pt will demonstrate ability to don/doff shirt at [place] guard-supervision level using hemi dressing techniques and a/e PRN (LUE).   Status On-going   OT SHORT TERM GOAL #3   Title Pt will be able to use LUE as assist for clothing management during toileting at supervision level   Status On-going   OT SHORT TERM GOAL #4   Title Pt will be Mod I  HEP/home program LUE   Status On-going           OT Long Term Goals - 12/24/14 1600    OT LONG TERM GOAL #1   Title Pt will be Mod I UB dressing using hemi dressing techniques while seated (LUE)   Status On-going   OT LONG TERM GOAL #2   Title Pt will be Mod I clothing management related to toileting (don/doff pants) using LUE as assist    Status On-going   OT LONG TERM GOAL #3   Title Pt will be supervision level for UB bathing techniques seated on tub bench/chair using LUE as assist   Status On-going   OT LONG TERM GOAL #4   Title Pt will demonstrate increased coordination as seen by improving Box and Blocks score by 10 or more blocks LUE.    Status On-going               Plan - 12/28/14 1626    Clinical Impression Statement Patient will benefit from skilled OT intervention to address pain, strength and coordination in left upper extremity to improve her ability to use this arm functionally.   Rehab Potential Good   OT Treatment/Interventions Self-care/ADL training;Moist Heat;Therapeutic exercise;DME and/or AE instruction;Manual Therapy;Passive range of motion;Therapeutic exercises;Patient/family education;Therapeutic activities;Neuromuscular education;Balance training;Functional Mobility Training   OT Home Exercise Plan ongoing   Consulted and Agree with Plan of Care Patient        Problem List Patient Active Problem List   Diagnosis Date Noted  . Closed left humeral fracture 12/07/2014  . Fatigue 12/07/2014  . 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 Dystonia 08/10/2014  . Syncope and collapse 06/08/2014  . Orthostatic hypotension 06/08/2014  . 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 Fall down stairs 12/11/2013  . Basilar skull fracture 12/11/2013  . Traumatic intracranial hemorrhage 12/11/2013  . Back  pain 12/11/2013  . Subarachnoid hemorrhage following injury 12/09/2013  . Syncope 03/27/2013  . Clavicular fracture 03/27/2013  . COUGH 12/28/2007   Antony Salmon, OTR/L 12/28/14  4:28 PM Phone: [phone] Fax: [phone]   [facility] [address] [place], [place], [address] Phone: [phone]   Fax:  [phone]

## 2014-12-31 ENCOUNTER — Encounter: Payer: Self-pay | Admitting: Occupational Therapy

## 2014-12-31 ENCOUNTER — Ambulatory Visit: Payer: Medicare Other | Attending: Orthopedic Surgery | Admitting: Occupational Therapy

## 2014-12-31 DIAGNOSIS — R279 Unspecified lack of coordination: Secondary | ICD-10-CM | POA: Insufficient documentation

## 2014-12-31 DIAGNOSIS — R29898 Other symptoms and signs involving the musculoskeletal system: Secondary | ICD-10-CM

## 2014-12-31 DIAGNOSIS — M79602 Pain in left arm: Secondary | ICD-10-CM | POA: Diagnosis not present

## 2014-12-31 DIAGNOSIS — R278 Other lack of coordination: Secondary | ICD-10-CM

## 2014-12-31 DIAGNOSIS — R270 Ataxia, unspecified: Secondary | ICD-10-CM

## 2014-12-31 NOTE — Therapy (Signed)
[facility] [address] [place], [place], [address] Phone: [phone]   Fax:  [phone]  Occupational Therapy Treatment  Patient Details  Name: Claudia Perez MRN: [id number] Date of Birth: 27-Jan-1951 Referring Provider:  Randa Lynn*  Encounter Date: 12/31/2014      OT End of Session - 12/31/14 1555    Visit Number 6   Number of Visits 17   Date for OT Re-Evaluation 02/26/15   Authorization Type Medicare part A & B   OT Start Time 1448   OT Stop Time 1538   OT Time Calculation (min) 50 min   Equipment Utilized During Treatment UBE   Activity Tolerance Patient tolerated treatment well   Behavior During Therapy [facility] for tasks assessed/performed      Past Medical History  Diagnosis Date  . Mobility impaired   . Hypercholesteremia   . Vitamin D deficiency   . Shortness of breath     "even now, just talking" (03/27/2013)  . Hyperthyroidism     "a little bit; don't take RX for it" (03/27/2013)  . GERD (gastroesophageal reflux disease)   . Seizures     "think I might have; been blacking out several times in the past" (03/27/2013)  . Fall at home 03/27/2013    "lost consciousness; fractured right clavicle" (03/27/2013)  . Other closed skull fracture without mention of intracranial injury, unspecified state of consciousness   . Subdural hemorrhage   . Abnormality of gait     Past Surgical History  Procedure Laterality Date  . Appendectomy  1960's  . Eye surgery  02/2013    cataract extraction w/ intraocular lens implant  . Brain surgery  10/2003    SDH evac    There were no vitals taken for this visit.  Visit Diagnosis:  Ataxia  Weakness of left arm  Left arm pain  Decreased coordination      Subjective Assessment - 12/31/14 1545    Symptoms I feel I am making good progress   Pertinent History see snapshot   Limitations Decreased ability to grade muscle activity   Patient Stated Goals  Pt wishes to increase motion "get better motion" L UE   Currently in Pain? Yes   Pain Score 1    Pain Location Shoulder   Pain Orientation Left   Pain Descriptors / Indicators Aching   Pain Type Chronic pain   Pain Onset More than a month ago   Pain Frequency Intermittent   Aggravating Factors  Active motion, attempting to position at night   Pain Relieving Factors rest, reposition   Multiple Pain Sites No                 OT Treatments/Exercises (OP) - 12/31/14 0001    ADLs   UB Dressing Moderate assistance to doff, donn overhead shirt.  Min assistance to don front opening trench coat   LB Dressing Able to use left hand now to pull up pants, tuck in shirt   Shoulder Exercises: Supine   Flexion AROM;Strengthening;Both;5 reps  1/2 lb resistance   Shoulder Exercises: Seated   Other Seated Exercises UBE Level 2 for 5 min   Shoulder Exercises: Standing   Other Standing Exercises Pendulum exercises for left upper extremity to encourage forward flexion, abduction, and combined motions at glenohumeral joint                OT Education - 12/31/14 1555    Education provided Yes  Education Details pendulum exercises   Person(s) Educated Patient   Methods Explanation;Demonstration;Verbal cues   Comprehension Verbalized understanding;Returned demonstration          OT Short Term Goals - 12/31/14 1558    OT SHORT TERM GOAL #1   Title Pt will report decreased pain LUE to 3/10 or less at rest   Status Achieved           OT Long Term Goals - 12/24/14 1600    OT LONG TERM GOAL #1   Title Pt will be Mod I UB dressing using hemi dressing techniques while seated (LUE)   Status On-going   OT LONG TERM GOAL #2   Title Pt will be Mod I clothing management related to toileting (don/doff pants) using LUE as assist    Status On-going   OT LONG TERM GOAL #3   Title Pt will be supervision level for UB bathing techniques seated on tub bench/chair using LUE as assist    Status On-going   OT LONG TERM GOAL #4   Title Pt will demonstrate increased coordination as seen by improving Box and Blocks score by 10 or more blocks LUE.    Status On-going               Plan - 12/31/14 1556    Clinical Impression Statement Patient will benefit from continued skilled OT intervention to address strength and coordination in left arm to improve functional use and decrease caregiver burden with ADL   Rehab Potential Good   Clinical Impairments Affecting Rehab Potential Pt reports having impaired AROM/bilateral shoulder impairement since "May 2015" requiring assistance w/ ADL's and homemaking activities, h/o balance issues, pain, also dysmetria, choreatic type movement which impeded controlled functional use of UE's.     OT Frequency 2x / week   OT Duration 8 weeks   OT Treatment/Interventions Self-care/ADL training;Moist Heat;Therapeutic exercise;DME and/or AE instruction;Manual Therapy;Passive range of motion;Therapeutic exercises;Patient/family education;Therapeutic activities;Neuromuscular education;Balance training;Functional Mobility Training   Plan Supine for UE conditioning, place and hold, attempt to carryover to guided movement in upright positions.     OT Home Exercise Plan ongoing   Consulted and Agree with Plan of Care Patient        Problem List Patient Active Problem List   Diagnosis Date Noted  . Closed left humeral fracture 12/07/2014  . Fatigue 12/07/2014  . 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 Dystonia 08/10/2014  . Syncope and collapse 06/08/2014  . Orthostatic hypotension 06/08/2014  . 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 Fall down stairs 12/11/2013  . Basilar skull fracture 12/11/2013  . Traumatic intracranial hemorrhage 12/11/2013  . Back pain 12/11/2013  . Subarachnoid  hemorrhage following injury 12/09/2013  . Syncope 03/27/2013  . Clavicular fracture 03/27/2013  . COUGH 12/28/2007    Mariah Milling 12/31/2014, 3:59 PM  [facility] [address] [place], [place], [address] Phone: [phone]   Fax:  [phone]

## 2014-12-31 NOTE — Patient Instructions (Signed)
Pendulum Exercises  To be completed with husband (or other caregiver) supervising due to risk for falling  1) Stand at dresser with right side against dresser.   2) Rest right forearm on dresser. 3) Bend at waist and allow left arm to dangle downward with thumb pointing upward (toward nose) 4a) Allow left arm to swing forward and backward 10 times 4b) Allow left arm to swing side to side 10 times 4c) Allow left arm to swing in a circle clockwise 5 times 4d) Allow left arm to swing in a circle counterclockwise 5 times.

## 2015-01-04 ENCOUNTER — Ambulatory Visit: Payer: Medicare Other | Admitting: Occupational Therapy

## 2015-01-04 ENCOUNTER — Encounter: Payer: Self-pay | Admitting: Occupational Therapy

## 2015-01-04 DIAGNOSIS — R29898 Other symptoms and signs involving the musculoskeletal system: Secondary | ICD-10-CM | POA: Diagnosis not present

## 2015-01-04 DIAGNOSIS — R278 Other lack of coordination: Secondary | ICD-10-CM

## 2015-01-04 DIAGNOSIS — R270 Ataxia, unspecified: Secondary | ICD-10-CM

## 2015-01-04 DIAGNOSIS — M79602 Pain in left arm: Secondary | ICD-10-CM | POA: Diagnosis not present

## 2015-01-04 DIAGNOSIS — R279 Unspecified lack of coordination: Secondary | ICD-10-CM

## 2015-01-04 NOTE — Therapy (Signed)
[facility] [address] [place], [place], [address] Phone: [phone]   Fax:  [phone]  Occupational Therapy Treatment  Patient Details  Name: Claudia Perez MRN: [id number] Date of Birth: [birth year] Referring Provider:  Randa Lynn*  Encounter Date: 01/04/2015      OT End of Session - 01/04/15 1747    Visit Number 7   Number of Visits 17   Date for OT Re-Evaluation 02/26/15   Authorization Type Medicare part A & B   OT Start Time 1443   OT Stop Time 1532   OT Time Calculation (min) 49 min   Equipment Utilized During Treatment UBE, UE ranger   Activity Tolerance Patient tolerated treatment well   Behavior During Therapy [facility] for tasks assessed/performed      Past Medical History  Diagnosis Date  . Mobility impaired   . Hypercholesteremia   . Vitamin D deficiency   . Shortness of breath     "even now, just talking" (03/27/2013)  . Hyperthyroidism     "a little bit; don't take RX for it" (03/27/2013)  . GERD (gastroesophageal reflux disease)   . Seizures     "think I might have; been blacking out several times in the past" (03/27/2013)  . Fall at home 03/27/2013    "lost consciousness; fractured right clavicle" (03/27/2013)  . Other closed skull fracture without mention of intracranial injury, unspecified state of consciousness   . Subdural hemorrhage   . Abnormality of gait     Past Surgical History  Procedure Laterality Date  . Appendectomy  1960's  . Eye surgery  02/2013    cataract extraction w/ intraocular lens implant  . Brain surgery  10/2003    SDH evac    There were no vitals taken for this visit.  Visit Diagnosis:  Ataxia  Weakness of left arm  Left arm pain  Decreased coordination      Subjective Assessment - 01/04/15 1737    Symptoms No pain   Pertinent History see snapshot   Limitations Decreased ability to grade muscle activity   Patient Stated Goals Pt wishes to  increase motion "get better motion" L UE   Currently in Pain? No/denies   Pain Score 0-No pain                 OT Treatments/Exercises (OP) - 01/04/15 0001    ADLs   UB Dressing Min verb cues to doff trench coat today   Shoulder Exercises: Seated   Other Seated Exercises UBE lEVEL 2 5 MIN   Neurological Re-education Exercises   Scapular Stabilization Quadraped  with second helper   Shoulder Flexion AROM;Strengthening;Both;5 reps  1 lb resistance - chest press   Other Exercises 1 Neuromuscular reeducation to address low to mid reach patterns in sitting and standing    Other Weight-Bearing Exercises 1 prone on elbows with emphasis on activation of proximal shoulder musculature bilaterally   Other Weight-Bearing Exercises 2 Quadraped with emphasis on long arm activation in forced use situation with mod assist   Weight Bearing Technique   Weight Bearing Technique Yes                OT Education - 01/04/15 1742    Education provided Yes   Education Details able to return demonstrate pendulum exercises   Person(s) Educated Patient   Methods Explanation   Comprehension Verbalized understanding;Returned demonstration          OT Short Term Goals -  01/04/15 1750    OT SHORT TERM GOAL #1   Title Pt will report decreased pain LUE to 3/10 or less at rest   Status Achieved   OT SHORT TERM GOAL #2   Title Pt will demonstrate ability to don/doff shirt at [place] guard-supervision level using hemi dressing techniques and a/e PRN (LUE).   Status On-going   OT SHORT TERM GOAL #3   Title Pt will be able to use LUE as assist for clothing management during toileting at supervision level   Status Partially Met   OT SHORT TERM GOAL #4   Title Pt will be Mod I HEP/home program LUE   Status On-going           OT Long Term Goals - 12/24/14 1600    OT LONG TERM GOAL #1   Title Pt will be Mod I UB dressing using hemi dressing techniques while seated (LUE)   Status On-going    OT LONG TERM GOAL #2   Title Pt will be Mod I clothing management related to toileting (don/doff pants) using LUE as assist    Status On-going   OT LONG TERM GOAL #3   Title Pt will be supervision level for UB bathing techniques seated on tub bench/chair using LUE as assist   Status On-going   OT LONG TERM GOAL #4   Title Pt will demonstrate increased coordination as seen by improving Box and Blocks score by 10 or more blocks LUE.    Status On-going               Plan - 01/04/15 1749    Clinical Impression Statement Patient will benefit from continued skilled OT Intervention to address strength and coordination in left upper extremity to ultimately increase independence with ADL   Rehab Potential Good   Clinical Impairments Affecting Rehab Potential Pt reports having impaired AROM/bilateral shoulder impairement since "May 2015" requiring assistance w/ ADL's and homemaking activities, h/o balance issues, pain, also dysmetria, choreatic type movement which impeded controlled functional use of UE's.     OT Frequency 2x / week   OT Duration 8 weeks   OT Treatment/Interventions Self-care/ADL training;Moist Heat;Therapeutic exercise;DME and/or AE instruction;Manual Therapy;Passive range of motion;Therapeutic exercises;Patient/family education;Therapeutic activities;Neuromuscular education;Balance training;Functional Mobility Training   Plan UE conditioning, closed to open chain activities left upper extremity   OT Home Exercise Plan ongoing   Consulted and Agree with Plan of Care Patient        Problem List Patient Active Problem List   Diagnosis Date Noted  . Closed left humeral fracture 12/07/2014  . Fatigue 12/07/2014  . 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 Dystonia 08/10/2014  . Syncope and collapse 06/08/2014  . Orthostatic hypotension 06/08/2014  . 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 Traumatic brain  injury with loss of consciousness of 31 minutes to 59 minutes 12/14/2013  . Fall down stairs 12/11/2013  . Basilar skull fracture 12/11/2013  . Traumatic intracranial hemorrhage 12/11/2013  . Back pain 12/11/2013  . Subarachnoid hemorrhage following injury 12/09/2013  . Syncope 03/27/2013  . Clavicular fracture 03/27/2013  . COUGH 12/28/2007   Antony Salmon, OTR/L 01/04/2015 5:52 PM Phone: [phone] Fax: [phone]   [facility] [address] [place], [place], [address] Phone: [phone]   Fax:  [phone]

## 2015-01-10 ENCOUNTER — Encounter: Payer: Self-pay | Admitting: Occupational Therapy

## 2015-01-10 ENCOUNTER — Ambulatory Visit: Payer: Medicare Other | Admitting: Occupational Therapy

## 2015-01-10 DIAGNOSIS — R29898 Other symptoms and signs involving the musculoskeletal system: Secondary | ICD-10-CM | POA: Diagnosis not present

## 2015-01-10 DIAGNOSIS — M79602 Pain in left arm: Secondary | ICD-10-CM

## 2015-01-10 DIAGNOSIS — R278 Other lack of coordination: Secondary | ICD-10-CM

## 2015-01-10 DIAGNOSIS — R279 Unspecified lack of coordination: Secondary | ICD-10-CM | POA: Diagnosis not present

## 2015-01-10 DIAGNOSIS — R270 Ataxia, unspecified: Secondary | ICD-10-CM

## 2015-01-10 NOTE — Patient Instructions (Signed)
Review and perform HEP for LUE stabilization, A/AROM  Prone on elbows standing at counter top w/ emphasis on activation of proximal shoulder musculature bilaterally x5 reps ea, vc's/tc's for proper positioning.

## 2015-01-10 NOTE — Therapy (Signed)
[facility] [address] [place], [place], [address] Phone: [phone]   Fax:  [phone]  Occupational Therapy Treatment  Patient Details  Name: Claudia Perez MRN: [id number] Date of Birth: [birth year] Referring Provider:  Randa Lynn*  Encounter Date: 01/10/2015      OT End of Session - 01/10/15 1254    Visit Number 8   Number of Visits 17   Date for OT Re-Evaluation 02/26/15   Authorization Type Medicare part A & B   OT Start Time 1147   OT Stop Time 1235   OT Time Calculation (min) 48 min   Equipment Utilized During Treatment UBE, cane, large peg board   Activity Tolerance Patient tolerated treatment well   Behavior During Therapy [facility] for tasks assessed/performed      Past Medical History  Diagnosis Date  . Mobility impaired   . Hypercholesteremia   . Vitamin D deficiency   . Shortness of breath     "even now, just talking" (03/27/2013)  . Hyperthyroidism     "a little bit; don't take RX for it" (03/27/2013)  . GERD (gastroesophageal reflux disease)   . Seizures     "think I might have; been blacking out several times in the past" (03/27/2013)  . Fall at home 03/27/2013    "lost consciousness; fractured right clavicle" (03/27/2013)  . Other closed skull fracture without mention of intracranial injury, unspecified state of consciousness   . Subdural hemorrhage   . Abnormality of gait     Past Surgical History  Procedure Laterality Date  . Appendectomy  1960's  . Eye surgery  02/2013    cataract extraction w/ intraocular lens implant  . Brain surgery  10/2003    SDH evac    There were no vitals taken for this visit.  Visit Diagnosis:  Ataxia  Weakness of left arm  Left arm pain  Decreased coordination      Subjective Assessment - 01/10/15 1151    Symptoms Pt reports no pain. She states that she has been dressing herself although it takes her a long time "I am getting better"   Pertinent History see snapshot   Limitations Decreased ability to grade muscle activity   Patient Stated Goals Pt reports that she would like to improve LUE AROM "get better motion".   Currently in Pain? No/denies   Pain Score 0-No pain                 OT Treatments/Exercises (OP) - 01/10/15 0001    ADLs   UB Dressing Min VC's to doff trench coat, Min A to don trench coat   Shoulder Exercises: Supine   Protraction AAROM;AROM;Both;10 reps   Flexion AROM;Strengthening;Both;5 reps   Other Supine Exercises beginning place and hold shoulder flexion with elbow extension - momentary   Other Supine Exercises Chest press with bar - no additional weight with emphasis on controlled movement in both directions - able to sustain flexed shoulder position for ~3 seconds   Shoulder Exercises: Seated   Other Seated Exercises UBE lEVEL 2 5 MIN   Other Seated Exercises Seated with extension pole to support weight of arm to promote supported forward reaching patterns, as wella s increasing active (supported) abduction in left shoulder   Shoulder Exercises: ROM/Strengthening   Pendulum Pendulum exercises L UE in standing  x 5 reps each   Proximal Shoulder Strengthening, Seated Prox Shoulder Stabilization/WB ex's in sitting and standing at countertop x5 reps each  LUE   Other ROM/Strengthening Exercises L forward reach in sitting to place large pegs in pegboard  x31 pegs      Review and perform HEP for LUE stabilization, A/AROM  Prone on elbows standing at counter top w/ emphasis on activation of proximal shoulder musculature bilaterally x5 reps ea, vc's/tc's for proper positioning.           OT Education - 01/10/15 1252    Education provided Yes   Education Details Able to demonstrate and perform pendulum ex's & proximal stabilization of shoulders through elbows at counter   Person(s) Educated Patient   Methods Explanation   Comprehension Verbalized understanding;Tactile cues  required;Need further instruction;Verbal cues required          OT Short Term Goals - 01/04/15 1750    OT SHORT TERM GOAL #1   Title Pt will report decreased pain LUE to 3/10 or less at rest   Status Achieved   OT SHORT TERM GOAL #2   Title Pt will demonstrate ability to don/doff shirt at [place] guard-supervision level using hemi dressing techniques and a/e PRN (LUE).   Status On-going   OT SHORT TERM GOAL #3   Title Pt will be able to use LUE as assist for clothing management during toileting at supervision level   Status Partially Met   OT SHORT TERM GOAL #4   Title Pt will be Mod I HEP/home program LUE   Status On-going           OT Long Term Goals - 12/24/14 1600    OT LONG TERM GOAL #1   Title Pt will be Mod I UB dressing using hemi dressing techniques while seated (LUE)   Status On-going   OT LONG TERM GOAL #2   Title Pt will be Mod I clothing management related to toileting (don/doff pants) using LUE as assist    Status On-going   OT LONG TERM GOAL #3   Title Pt will be supervision level for UB bathing techniques seated on tub bench/chair using LUE as assist   Status On-going   OT LONG TERM GOAL #4   Title Pt will demonstrate increased coordination as seen by improving Box and Blocks score by 10 or more blocks LUE.    Status On-going               Plan - 01/10/15 1255    Clinical Impression Statement Pt will cont to benefit from continued skillt OT to address deficits in strength and coordination L UE & assist w/ increased independence w/ ADL's and self care tasks.   Rehab Potential Good   Clinical Impairments Affecting Rehab Potential Pt reports having impaired AROM/bilateral shoulder impairement since "May 2015" requiring assistance w/ ADL's and homemaking activities, h/o balance issues, pain, also dysmetria, choreatic type movement which impeded controlled functional use of UE's.     OT Frequency 2x / week   OT Duration 8 weeks   OT Treatment/Interventions  Self-care/ADL training;Moist Heat;Therapeutic exercise;DME and/or AE instruction;Manual Therapy;Passive range of motion;Therapeutic exercises;Patient/family education;Therapeutic activities;Neuromuscular education;Balance training;Functional Mobility Training   Plan Bilateral shoulder proximal stabilization; closed to open chain activities L UE.   OT Home Exercise Plan ongoing   Consulted and Agree with Plan of Care Patient        Problem List Patient Active Problem List   Diagnosis Date Noted  . Closed left humeral fracture 12/07/2014  . Fatigue 12/07/2014  . 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 Dystonia 08/10/2014  . Syncope and collapse 06/08/2014  . Orthostatic hypotension 06/08/2014  . 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 Fall down stairs 12/11/2013  . Basilar skull fracture 12/11/2013  . Traumatic intracranial hemorrhage 12/11/2013  . Back pain 12/11/2013  . Subarachnoid hemorrhage following injury 12/09/2013  . Syncope 03/27/2013  . Clavicular fracture 03/27/2013  . COUGH 12/28/2007    Maxten Shuler Beth Dixon, OTR/L 01/10/2015, 1:01 PM  [facility] [address] [place], [place], [address] Phone: [phone]   Fax:  [phone]

## 2015-01-11 ENCOUNTER — Encounter: Payer: Self-pay | Admitting: Occupational Therapy

## 2015-01-11 ENCOUNTER — Ambulatory Visit: Payer: Medicare Other | Admitting: Occupational Therapy

## 2015-01-11 DIAGNOSIS — R278 Other lack of coordination: Secondary | ICD-10-CM

## 2015-01-11 DIAGNOSIS — R29898 Other symptoms and signs involving the musculoskeletal system: Secondary | ICD-10-CM | POA: Diagnosis not present

## 2015-01-11 DIAGNOSIS — M79602 Pain in left arm: Secondary | ICD-10-CM | POA: Diagnosis not present

## 2015-01-11 DIAGNOSIS — R270 Ataxia, unspecified: Secondary | ICD-10-CM

## 2015-01-11 DIAGNOSIS — R279 Unspecified lack of coordination: Secondary | ICD-10-CM

## 2015-01-11 NOTE — Therapy (Signed)
[facility] [address] [place], [place], [address] Phone: [phone]   Fax:  [phone]  Occupational Therapy Treatment  Patient Details  Name: Claudia Perez MRN: [id number] Date of Birth: [birth year] Referring Provider:  Randa Lynn*  Encounter Date: 01/11/2015      OT End of Session - 01/11/15 1651    Visit Number 9   Number of Visits 17   Date for OT Re-Evaluation 02/02/15   Authorization Type Medicare part A & B   Authorization Time Period Will need Gcode   OT Start Time 1440   OT Stop Time 1533   OT Time Calculation (min) 53 min   Activity Tolerance Patient tolerated treatment well   Behavior During Therapy [facility] for tasks assessed/performed      Past Medical History  Diagnosis Date  . Mobility impaired   . Hypercholesteremia   . Vitamin D deficiency   . Shortness of breath     "even now, just talking" (03/27/2013)  . Hyperthyroidism     "a little bit; don't take RX for it" (03/27/2013)  . GERD (gastroesophageal reflux disease)   . Seizures     "think I might have; been blacking out several times in the past" (03/27/2013)  . Fall at home 03/27/2013    "lost consciousness; fractured right clavicle" (03/27/2013)  . Other closed skull fracture without mention of intracranial injury, unspecified state of consciousness   . Subdural hemorrhage   . Abnormality of gait     Past Surgical History  Procedure Laterality Date  . Appendectomy  1960's  . Eye surgery  02/2013    cataract extraction w/ intraocular lens implant  . Brain surgery  10/2003    SDH evac    There were no vitals taken for this visit.  Visit Diagnosis:  Weakness of left arm  Ataxia  Decreased coordination  Left arm pain      Subjective Assessment - 01/11/15 1450    Symptoms Patient reports that she put on a blouse today and buttoned it without help.   Pertinent History see snapshot   Limitations Decreased ability to  grade muscle activity   Patient Stated Goals Pt reports that she would like to improve LUE AROM "get better motion".   Currently in Pain? No/denies   Pain Score 0-No pain                 OT Treatments/Exercises (OP) - 01/11/15 0001    ADLs   UB Dressing Increased time, instructional and tactile cues to utilize button hook to button blouse   Neurological Re-education Exercises   Scapular Stabilization Right;Left;Quadraped;Side-lying   Elbow Flexion AROM;Strengthening;AAROM;Left;10 reps   Elbow Extension AROM;AAROM;Strengthening;Left;10 reps  through 75% range available   Other Exercises 1 Neuromuscular reeducation to address improved motor control and muscle activation in forced use situation (sidelying and quadraped.)   Other Exercises 2 Supported forward reaching patterns in sitting    Other Weight-Bearing Exercises 1 sidelying for head and UE control    Other Weight-Bearing Exercises 2 Quadraped to improve strength in left upper extremity (shoulder and elbow) and also to address active reach pattern in closed chian conditon                OT Education - 01/11/15 1650    Education provided Yes   Education Details Use of button hook for dressing   Person(s) Educated Patient   Methods Explanation;Demonstration   Comprehension Verbalized understanding;Tactile cues required  OT Short Term Goals - 01/11/15 1452    OT SHORT TERM GOAL #1   Title Pt will report decreased pain LUE to 3/10 or less at rest   Status Achieved   OT SHORT TERM GOAL #2   Title Pt will demonstrate ability to don/doff shirt at [place] guard-supervision level using hemi dressing techniques and a/e PRN (LUE).   Status Achieved   OT SHORT TERM GOAL #3   Title Pt will be able to use LUE as assist for clothing management during toileting at supervision level   Status Achieved   OT SHORT TERM GOAL #4   Title Pt will be Mod I HEP/home program LUE   Status Achieved           OT Long  Term Goals - 12/24/14 1600    OT LONG TERM GOAL #1   Title Pt will be Mod I UB dressing using hemi dressing techniques while seated (LUE)   Status On-going   OT LONG TERM GOAL #2   Title Pt will be Mod I clothing management related to toileting (don/doff pants) using LUE as assist    Status On-going   OT LONG TERM GOAL #3   Title Pt will be supervision level for UB bathing techniques seated on tub bench/chair using LUE as assist   Status On-going   OT LONG TERM GOAL #4   Title Pt will demonstrate increased coordination as seen by improving Box and Blocks score by 10 or more blocks LUE.    Status On-going               Plan - 01/11/15 1655    Plan Left shoulder proximal strengthening, closed to open chain.  Needs Gcode next visit        Problem List Patient Active Problem List   Diagnosis Date Noted  . Closed left humeral fracture 12/07/2014  . Fatigue 12/07/2014  . 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 Dystonia 08/10/2014  . Syncope and collapse 06/08/2014  . Orthostatic hypotension 06/08/2014  . 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 Fall down stairs 12/11/2013  . Basilar skull fracture 12/11/2013  . Traumatic intracranial hemorrhage 12/11/2013  . Back pain 12/11/2013  . Subarachnoid hemorrhage following injury 12/09/2013  . Syncope 03/27/2013  . Clavicular fracture 03/27/2013  . COUGH 12/28/2007   Antony Salmon, OTR/L 01/11/2015 4:56 PM Phone: [phone] Fax: [phone]   [facility] [address] [place], [place], [address] Phone: [phone]   Fax:  [phone]

## 2015-01-14 ENCOUNTER — Encounter: Payer: Medicare Other | Admitting: Occupational Therapy

## 2015-01-21 ENCOUNTER — Encounter: Payer: Self-pay | Admitting: Occupational Therapy

## 2015-01-21 ENCOUNTER — Ambulatory Visit: Payer: Medicare Other | Admitting: Occupational Therapy

## 2015-01-21 DIAGNOSIS — R29898 Other symptoms and signs involving the musculoskeletal system: Secondary | ICD-10-CM

## 2015-01-21 DIAGNOSIS — R278 Other lack of coordination: Secondary | ICD-10-CM

## 2015-01-21 DIAGNOSIS — R279 Unspecified lack of coordination: Secondary | ICD-10-CM | POA: Diagnosis not present

## 2015-01-21 DIAGNOSIS — R270 Ataxia, unspecified: Secondary | ICD-10-CM

## 2015-01-21 DIAGNOSIS — M79602 Pain in left arm: Secondary | ICD-10-CM

## 2015-01-21 NOTE — Therapy (Signed)
[facility] [address] [place], [place], [address] Phone: [phone]   Fax:  [phone]  Occupational Therapy Treatment  Patient Details  Name: Claudia Perez MRN: [id number] Date of Birth: 07/07/1951 Referring Provider:  Randa Lynn*  Encounter Date: 01/21/2015      OT End of Session - 01/21/15 1647    Visit Number 10   Number of Visits 17   Date for OT Re-Evaluation 02/02/15   Authorization Type Medicare part A & B   Authorization Time Period Will need Gcode   OT Start Time 1440   OT Stop Time 1530   OT Time Calculation (min) 50 min   Equipment Utilized During Treatment UE ranger, 1 lb weight   Activity Tolerance Patient tolerated treatment well   Behavior During Therapy [facility] for tasks assessed/performed      Past Medical History  Diagnosis Date  . Mobility impaired   . Hypercholesteremia   . Vitamin D deficiency   . Shortness of breath     "even now, just talking" (03/27/2013)  . Hyperthyroidism     "a little bit; don't take RX for it" (03/27/2013)  . GERD (gastroesophageal reflux disease)   . Seizures     "think I might have; been blacking out several times in the past" (03/27/2013)  . Fall at home 03/27/2013    "lost consciousness; fractured right clavicle" (03/27/2013)  . Other closed skull fracture without mention of intracranial injury, unspecified state of consciousness   . Subdural hemorrhage   . Abnormality of gait     Past Surgical History  Procedure Laterality Date  . Appendectomy  1960's  . Eye surgery  02/2013    cataract extraction w/ intraocular lens implant  . Brain surgery  10/2003    SDH evac    There were no vitals taken for this visit.  Visit Diagnosis:  Weakness of left arm  Decreased coordination  Ataxia  Left arm pain      Subjective Assessment - 01/21/15 1630    Symptoms My arm is getting better   Pertinent History see snapshot   Limitations Decreased  ability to grade muscle activity, ataxia          [facility] OT Assessment - 01/21/15 0001    Coordination   Box and Blocks R:  28, L:19  Improved from eval - R:25, L:15               OT Treatments/Exercises (OP) - 01/21/15 0001    ADLs   UB Dressing min assist to don / doff trench coat in standing   Shoulder Exercises: Supine   Protraction AROM;Strengthening;Both;10 reps  1 lb weight   Flexion AROM;Strengthening;Both;10 reps;Weights  1 lb   Shoulder Flexion Weight (lbs) 1 lb   Other Supine Exercises place and hold with shoulder flexed to 90 degrees and elbow extension,   Other Supine Exercises chest press with bar and 1 lb weight controlled flexion and extension, initially with min guidance.   Shoulder Exercises: Standing   Other Standing Exercises UE Ranger on wall, guided overhead reaching, place and hold exercise   Neurological Re-education Exercises   Other Weight-Bearing Exercises 1 Sidelying to sidesitting on left, left arm stable support with proximal weight shift through trunk   Other Weight-Bearing Exercises 2 Quadriped with weight biased toward left upper extremity - forced use / strengthening   Weight Bearing Technique   Weight Bearing Technique Yes  OT Education - 01/22/2015 1646    Education provided Yes   Education Details Activation of core musculature with UE reaching activity   Person(s) Educated Patient   Methods Explanation;Tactile cues;Verbal cues   Comprehension Verbalized understanding;Tactile cues required;Verbal cues required          OT Short Term Goals - 01/11/15 1452    OT SHORT TERM GOAL #1   Title Pt will report decreased pain LUE to 3/10 or less at rest   Status Achieved   OT SHORT TERM GOAL #2   Title Pt will demonstrate ability to don/doff shirt at [place] guard-supervision level using hemi dressing techniques and a/e PRN (LUE).   Status Achieved   OT SHORT TERM GOAL #3   Title Pt will be able to use LUE as assist  for clothing management during toileting at supervision level   Status Achieved   OT SHORT TERM GOAL #4   Title Pt will be Mod I HEP/home program LUE   Status Achieved           OT Long Term Goals - 12/24/14 1600    OT LONG TERM GOAL #1   Title Pt will be Mod I UB dressing using hemi dressing techniques while seated (LUE)   Status On-going   OT LONG TERM GOAL #2   Title Pt will be Mod I clothing management related to toileting (don/doff pants) using LUE as assist    Status On-going   OT LONG TERM GOAL #3   Title Pt will be supervision level for UB bathing techniques seated on tub bench/chair using LUE as assist   Status On-going   OT LONG TERM GOAL #4   Title Pt will demonstrate increased coordination as seen by improving Box and Blocks score by 10 or more blocks LUE.    Status On-going               Plan - 01-22-15 1648    Clinical Impression Statement Patient continues to show steady improvement in range of motion and strength in left arm   Pt will benefit from skilled therapeutic intervention in order to improve on the following deficits (Retired) Decreased activity tolerance;Decreased balance;Decreased coordination;Decreased cognition;Decreased strength;Impaired UE functional use;Impaired tone;Pain;Decreased range of motion;Decreased endurance   Rehab Potential Good   Clinical Impairments Affecting Rehab Potential Pt reports having impaired AROM/bilateral shoulder impairement since "May 2015" requiring assistance w/ ADL's and homemaking activities, h/o balance issues, pain, also dysmetria, choreatic type movement which impeded controlled functional use of UE's.     OT Frequency 2x / week   OT Duration 4 weeks   OT Treatment/Interventions Self-care/ADL training;Moist Heat;Therapeutic exercise;DME and/or AE instruction;Manual Therapy;Passive range of motion;Therapeutic exercises;Patient/family education;Therapeutic activities;Neuromuscular education;Balance  training;Functional Mobility Training   Plan Closed to open chain strengthening of left UE, Check in with upper body dressing   OT Home Exercise Plan ongoing   Consulted and Agree with Plan of Care Patient          G-Codes - January 22, 2015 1650    Functional Assessment Tool Used Box and Blocks left UE   Functional Limitation Carrying, moving and handling objects   Carrying, Moving and Handling Objects Current Status [phone]) At least 60 percent but less than 80 percent impaired, limited or restricted   Carrying, Moving and Handling Objects Goal Status (U7253) At least 20 percent but less than 40 percent impaired, limited or restricted      Problem List Patient Active Problem List   Diagnosis Date Noted  .  Closed left humeral fracture 12/07/2014  . Fatigue 12/07/2014  . 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 Dystonia 08/10/2014  . Syncope and collapse 06/08/2014  . Orthostatic hypotension 06/08/2014  . 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 Fall down stairs 12/11/2013  . Basilar skull fracture 12/11/2013  . Traumatic intracranial hemorrhage 12/11/2013  . Back pain 12/11/2013  . Subarachnoid hemorrhage following injury 12/09/2013  . Syncope 03/27/2013  . Clavicular fracture 03/27/2013  . COUGH 12/28/2007   Antony Salmon, OTR/L 01/21/2015 4:53 PM Phone: [phone] Fax: [phone]   [facility] [address] [place], [place], [address] Phone: [phone]   Fax:  [phone]

## 2015-01-22 ENCOUNTER — Encounter: Payer: Self-pay | Admitting: Occupational Therapy

## 2015-01-22 ENCOUNTER — Ambulatory Visit: Payer: Medicare Other | Admitting: Occupational Therapy

## 2015-01-22 DIAGNOSIS — R278 Other lack of coordination: Secondary | ICD-10-CM

## 2015-01-22 DIAGNOSIS — R29898 Other symptoms and signs involving the musculoskeletal system: Secondary | ICD-10-CM

## 2015-01-22 DIAGNOSIS — R270 Ataxia, unspecified: Secondary | ICD-10-CM

## 2015-01-22 DIAGNOSIS — R279 Unspecified lack of coordination: Secondary | ICD-10-CM | POA: Diagnosis not present

## 2015-01-22 DIAGNOSIS — M79602 Pain in left arm: Secondary | ICD-10-CM | POA: Diagnosis not present

## 2015-01-22 NOTE — Therapy (Signed)
[facility] [address] [place], [place], [address] Phone: [phone]   Fax:  [phone]  Occupational Therapy Treatment  Patient Details  Name: Claudia Perez MRN: [id number] Date of Birth: [birth year] Referring Provider:  Randa Lynn*  Encounter Date: 01/22/2015      OT End of Session - 01/22/15 1707    Visit Number 11   Number of Visits 17   Date for OT Re-Evaluation 02/02/15   Authorization Type Medicare part A & B   Authorization Time Period Will need Gcode - plan to recert for 4 weeks   OT Start Time 1440   OT Stop Time 1531   OT Time Calculation (min) 51 min   Equipment Utilized During Treatment UE ranger   Activity Tolerance Patient tolerated treatment well   Behavior During Therapy [facility] for tasks assessed/performed      Past Medical History  Diagnosis Date  . Mobility impaired   . Hypercholesteremia   . Vitamin D deficiency   . Shortness of breath     "even now, just talking" (03/27/2013)  . Hyperthyroidism     "a little bit; don't take RX for it" (03/27/2013)  . GERD (gastroesophageal reflux disease)   . Seizures     "think I might have; been blacking out several times in the past" (03/27/2013)  . Fall at home 03/27/2013    "lost consciousness; fractured right clavicle" (03/27/2013)  . Other closed skull fracture without mention of intracranial injury, unspecified state of consciousness   . Subdural hemorrhage   . Abnormality of gait     Past Surgical History  Procedure Laterality Date  . Appendectomy  1960's  . Eye surgery  02/2013    cataract extraction w/ intraocular lens implant  . Brain surgery  10/2003    SDH evac    There were no vitals taken for this visit.  Visit Diagnosis:  Decreased coordination  Weakness of left arm  Ataxia      Subjective Assessment - 01/22/15 1657    Symptoms I really don't feel any pain - just weakness   Pertinent History see snapshot    Limitations Decreased ability to grade muscle activity, ataxia   Patient Stated Goals Pt reports that she would like to improve LUE AROM "get better motion".   Currently in Pain? No/denies                 OT Treatments/Exercises (OP) - 01/22/15 0001    ADLs   UB Dressing mod assist to don trench coat when distracted, supervision to doff trench coat   Shoulder Exercises: Supine   Other Supine Exercises Place and hold exercises shoulder flexed to 90 degrees with elbow extended, able to stabilize 2-3 seconds, in supported position - with ball between two hands - able to stabilize for 3-5 seconds with mod cueing to activate arm musculature   Shoulder Exercises: Standing   Other Standing Exercises UE ranger on wall for supported overhead reach - place and hold.  Patient lacks strength to move arm into this position, but can sustain for 5-7 seconds with mod cueing / encouragement   Neurological Re-education Exercises   Other Exercises 1 Neuromuscular re-education to address timing and specificity of muscle activation.  Patient very ataxic, but with increased attention can isolate individual joint movemtn and control.  Difficult for her to replicate, and repetition does not consistently improve patterns of movement   Other Exercises 2 Able to reach forward to ground to  retriev lightweigh, large item with two hands. while seated   Other Weight-Bearing Exercises 1 Sidelying to sidesitting on left, left arm stable support with proximal weight shift through trunk   Other Weight-Bearing Exercises 2 Quadriped with weight biased toward left upper extremity - forced use / strengthening                OT Education - 01/22/15 1705    Education provided Yes   Education Details Core activation with UE use   Person(s) Educated Patient   Methods Explanation;Demonstration;Tactile cues   Comprehension Verbalized understanding;Verbal cues required;Tactile cues required          OT Short Term  Goals - 01/11/15 1452    OT SHORT TERM GOAL #1   Title Pt will report decreased pain LUE to 3/10 or less at rest   Status Achieved   OT SHORT TERM GOAL #2   Title Pt will demonstrate ability to don/doff shirt at [place] guard-supervision level using hemi dressing techniques and a/e PRN (LUE).   Status Achieved   OT SHORT TERM GOAL #3   Title Pt will be able to use LUE as assist for clothing management during toileting at supervision level   Status Achieved   OT SHORT TERM GOAL #4   Title Pt will be Mod I HEP/home program LUE   Status Achieved           OT Long Term Goals - 12/24/14 1600    OT LONG TERM GOAL #1   Title Pt will be Mod I UB dressing using hemi dressing techniques while seated (LUE)   Status On-going   OT LONG TERM GOAL #2   Title Pt will be Mod I clothing management related to toileting (don/doff pants) using LUE as assist    Status On-going   OT LONG TERM GOAL #3   Title Pt will be supervision level for UB bathing techniques seated on tub bench/chair using LUE as assist   Status On-going   OT LONG TERM GOAL #4   Title Pt will demonstrate increased coordination as seen by improving Box and Blocks score by 10 or more blocks LUE.    Status On-going               Plan - 01/22/15 1708    Clinical Impression Statement Patient continues to show improvement with left UE strength in gravity supported positions   Pt will benefit from skilled therapeutic intervention in order to improve on the following deficits (Retired) Decreased activity tolerance;Decreased balance;Decreased coordination;Decreased cognition;Decreased strength;Impaired UE functional use;Impaired tone;Pain;Decreased range of motion;Decreased endurance   Rehab Potential Good   Clinical Impairments Affecting Rehab Potential Pt reports having impaired AROM/bilateral shoulder impairement since "May 2015" requiring assistance w/ ADL's and homemaking activities, h/o balance issues, pain, also dysmetria,  choreatic type movement which impeded controlled functional use of UE's.     OT Frequency 2x / week   OT Duration 8 weeks   OT Treatment/Interventions Self-care/ADL training;Moist Heat;Therapeutic exercise;DME and/or AE instruction;Manual Therapy;Passive range of motion;Therapeutic exercises;Patient/family education;Therapeutic activities;Neuromuscular education;Balance training;Functional Mobility Training   Plan Cosed to open chain strengthening   OT Home Exercise Plan ongoing   Consulted and Agree with Plan of Care Patient          G-Codes - 01-30-15 1650    Functional Assessment Tool Used Box and Blocks left UE   Functional Limitation Carrying, moving and handling objects   Carrying, Moving and Handling Objects Current Status (V7846) At least 60  percent but less than 80 percent impaired, limited or restricted   Carrying, Moving and Handling Objects Goal Status (G5498) At least 20 percent but less than 40 percent impaired, limited or restricted      Problem List Patient Active Problem List   Diagnosis Date Noted  . Closed left humeral fracture 12/07/2014  . Fatigue 12/07/2014  . 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 Dystonia 08/10/2014  . Syncope and collapse 06/08/2014  . Orthostatic hypotension 06/08/2014  . 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 Fall down stairs 12/11/2013  . Basilar skull fracture 12/11/2013  . Traumatic intracranial hemorrhage 12/11/2013  . Back pain 12/11/2013  . Subarachnoid hemorrhage following injury 12/09/2013  . Syncope 03/27/2013  . Clavicular fracture 03/27/2013  . COUGH 12/28/2007    Mariah Milling, OTR/L 01/22/2015, 5:10 PM  [facility] [address] [place], [place], [address] Phone: [phone]   Fax:   [phone]

## 2015-01-23 DIAGNOSIS — E780 Pure hypercholesterolemia: Secondary | ICD-10-CM | POA: Diagnosis not present

## 2015-01-23 DIAGNOSIS — E559 Vitamin D deficiency, unspecified: Secondary | ICD-10-CM | POA: Diagnosis not present

## 2015-01-23 DIAGNOSIS — L659 Nonscarring hair loss, unspecified: Secondary | ICD-10-CM | POA: Diagnosis not present

## 2015-01-28 ENCOUNTER — Encounter: Payer: Self-pay | Admitting: Occupational Therapy

## 2015-01-28 ENCOUNTER — Ambulatory Visit: Payer: Medicare Other | Admitting: Occupational Therapy

## 2015-01-28 DIAGNOSIS — R279 Unspecified lack of coordination: Secondary | ICD-10-CM

## 2015-01-28 DIAGNOSIS — R270 Ataxia, unspecified: Secondary | ICD-10-CM

## 2015-01-28 DIAGNOSIS — R278 Other lack of coordination: Secondary | ICD-10-CM

## 2015-01-28 DIAGNOSIS — R29898 Other symptoms and signs involving the musculoskeletal system: Secondary | ICD-10-CM

## 2015-01-28 DIAGNOSIS — M79602 Pain in left arm: Secondary | ICD-10-CM | POA: Diagnosis not present

## 2015-01-28 NOTE — Therapy (Signed)
[facility] [address] [place], [place], [address] Phone: [phone]   Fax:  [phone]  Occupational Therapy Treatment  Patient Details  Name: Claudia Perez MRN: [id number] Date of Birth: [birth year] Referring Provider:  Randa Lynn*  Encounter Date: 01/28/2015      OT End of Session - 01/28/15 1655    Visit Number 12   Number of Visits 17   Date for OT Re-Evaluation 02/02/15   Authorization Type Medicare part A & B   Authorization Time Period Will need Gcode - plan to recert for 4 weeks   OT Start Time 1440   OT Stop Time 1530   OT Time Calculation (min) 50 min   Activity Tolerance Patient tolerated treatment well   Behavior During Therapy [facility] for tasks assessed/performed      Past Medical History  Diagnosis Date  . Mobility impaired   . Hypercholesteremia   . Vitamin D deficiency   . Shortness of breath     "even now, just talking" (03/27/2013)  . Hyperthyroidism     "a little bit; don't take RX for it" (03/27/2013)  . GERD (gastroesophageal reflux disease)   . Seizures     "think I might have; been blacking out several times in the past" (03/27/2013)  . Fall at home 03/27/2013    "lost consciousness; fractured right clavicle" (03/27/2013)  . Other closed skull fracture without mention of intracranial injury, unspecified state of consciousness   . Subdural hemorrhage   . Abnormality of gait     Past Surgical History  Procedure Laterality Date  . Appendectomy  1960's  . Eye surgery  02/2013    cataract extraction w/ intraocular lens implant  . Brain surgery  10/2003    [facility] evac    There were no vitals taken for this visit.  Visit Diagnosis:  Weakness of left arm  Decreased coordination  Ataxia      Subjective Assessment - 01/28/15 1648    Currently in Pain? No/denies   Pain Score 0-No pain                 OT Treatments/Exercises (OP) - 01/28/15 0001    ADLs   UB  Dressing modified independent - 60 seconds to doff overhead shirt,  55 seconds to don overhead shirt after set up, supervision cueing to don trench coat in 4 minutes   Neurological Re-education Exercises   Other Exercises 1 Neuromuscular reeducation to address proximal stability in closed and open chain conditions.  Patient able to bench press 2 lbs through 5 repetitions with controlled ascent and descent.  Able to place and hold left arm with light resistance x 5 seconds.  Beginnign to address combined movements, and directional changes   Other Exercises 2 active assisted support to left upper extremity to place and hold against gravity.  Patient with increased control, and improved ability to isolate distinct motor pattenrs   Weight Bearing Position Quadraped   Other Weight-Bearing Exercises 1 Sidelying with left upper extremity on surface, with cue to push down (proximal strengthening) to lift chest upward   Other Weight-Bearing Exercises 2 Prone on elbows with cue to lift chest- spread scapulae apart (proximal strengthening)                OT Education - 01/28/15 1654    Education provided Yes   Education Details core activation with forward reach   Person(s) Educated Patient   Methods Explanation;Demonstration   Comprehension  Verbalized understanding          OT Short Term Goals - 01/11/15 1452    OT SHORT TERM GOAL #1   Title Pt will report decreased pain LUE to 3/10 or less at rest   Status Achieved   OT SHORT TERM GOAL #2   Title Pt will demonstrate ability to don/doff shirt at [place] guard-supervision level using hemi dressing techniques and a/e PRN (LUE).   Status Achieved   OT SHORT TERM GOAL #3   Title Pt will be able to use LUE as assist for clothing management during toileting at supervision level   Status Achieved   OT SHORT TERM GOAL #4   Title Pt will be Mod I HEP/home program LUE   Status Achieved           OT Long Term Goals - 12/24/14 1600    OT LONG  TERM GOAL #1   Title Pt will be Mod I UB dressing using hemi dressing techniques while seated (LUE)   Status On-going   OT LONG TERM GOAL #2   Title Pt will be Mod I clothing management related to toileting (don/doff pants) using LUE as assist    Status On-going   OT LONG TERM GOAL #3   Title Pt will be supervision level for UB bathing techniques seated on tub bench/chair using LUE as assist   Status On-going   OT LONG TERM GOAL #4   Title Pt will demonstrate increased coordination as seen by improving Box and Blocks score by 10 or more blocks LUE.    Status On-going               Plan - 01/28/15 1656    Clinical Impression Statement Patient continues to show improvement with functional use of left upper extremity due to improved strength, and decreased pain   Pt will benefit from skilled therapeutic intervention in order to improve on the following deficits (Retired) Decreased activity tolerance;Decreased balance;Decreased coordination;Decreased cognition;Decreased strength;Impaired UE functional use;Impaired tone;Pain;Decreased range of motion;Decreased endurance   Rehab Potential Good   Clinical Impairments Affecting Rehab Potential Pt reports having impaired AROM/bilateral shoulder impairement since "May 2015" requiring assistance w/ ADL's and homemaking activities, h/o balance issues, pain, also dysmetria, choreatic type movement which impeded controlled functional use of UE's.     OT Frequency 2x / week   OT Duration 8 weeks   OT Treatment/Interventions Self-care/ADL training;Moist Heat;Therapeutic exercise;DME and/or AE instruction;Manual Therapy;Passive range of motion;Therapeutic exercises;Patient/family education;Therapeutic activities;Neuromuscular education;Balance training;Functional Mobility Training   Plan closed to open chain strengthening   OT Home Exercise Plan ongoing   Consulted and Agree with Plan of Care Patient        Problem List Patient Active Problem  List   Diagnosis Date Noted  . Closed left humeral fracture 12/07/2014  . Fatigue 12/07/2014  . 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 Dystonia 08/10/2014  . Syncope and collapse 06/08/2014  . Orthostatic hypotension 06/08/2014  . 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 Fall down stairs 12/11/2013  . Basilar skull fracture 12/11/2013  . Traumatic intracranial hemorrhage 12/11/2013  . Back pain 12/11/2013  . Subarachnoid hemorrhage following injury 12/09/2013  . Syncope 03/27/2013  . Clavicular fracture 03/27/2013  . COUGH 12/28/2007    Mariah Milling, OTR/L 01/28/2015, 4:58 PM  [facility]  [facility] [address] [place], [place], [address] Phone: [phone]   Fax:  [phone]

## 2015-01-30 DIAGNOSIS — L720 Epidermal cyst: Secondary | ICD-10-CM | POA: Diagnosis not present

## 2015-01-30 DIAGNOSIS — L7211 Pilar cyst: Secondary | ICD-10-CM | POA: Diagnosis not present

## 2015-01-31 ENCOUNTER — Ambulatory Visit: Payer: Medicare Other | Attending: Orthopedic Surgery | Admitting: Occupational Therapy

## 2015-01-31 ENCOUNTER — Encounter: Payer: Self-pay | Admitting: Occupational Therapy

## 2015-01-31 DIAGNOSIS — R279 Unspecified lack of coordination: Secondary | ICD-10-CM | POA: Diagnosis not present

## 2015-01-31 DIAGNOSIS — R29898 Other symptoms and signs involving the musculoskeletal system: Secondary | ICD-10-CM

## 2015-01-31 DIAGNOSIS — M79602 Pain in left arm: Secondary | ICD-10-CM | POA: Diagnosis not present

## 2015-01-31 DIAGNOSIS — R278 Other lack of coordination: Secondary | ICD-10-CM

## 2015-01-31 NOTE — Therapy (Signed)
[facility] [address] [place], [place], [address] Phone: [phone]   Fax:  [phone]  Occupational Therapy Treatment  Patient Details  Name: Claudia Perez MRN: [id number] Date of Birth: 05-Jul-1951 Referring Provider:  Randa Lynn*  Encounter Date: 01/31/2015      OT End of Session - 01/31/15 1624    Visit Number 13   Number of Visits 17   Date for OT Re-Evaluation 02/02/15   Authorization Type Medicare part A & B   Authorization Time Period Will need Gcode - plan to recert for 4 weeks   OT Start Time 1446   OT Stop Time 1524   OT Time Calculation (min) 38 min   Equipment Utilized During Treatment UBE   Activity Tolerance Patient limited by lethargy  Not feeling well - patient requested to end session early   Behavior During Therapy [facility] for tasks assessed/performed      Past Medical History  Diagnosis Date  . Mobility impaired   . Hypercholesteremia   . Vitamin D deficiency   . Shortness of breath     "even now, just talking" (03/27/2013)  . Hyperthyroidism     "a little bit; don't take RX for it" (03/27/2013)  . GERD (gastroesophageal reflux disease)   . Seizures     "think I might have; been blacking out several times in the past" (03/27/2013)  . Fall at home 03/27/2013    "lost consciousness; fractured right clavicle" (03/27/2013)  . Other closed skull fracture without mention of intracranial injury, unspecified state of consciousness   . Subdural hemorrhage   . Abnormality of gait     Past Surgical History  Procedure Laterality Date  . Appendectomy  1960's  . Eye surgery  02/2013    cataract extraction w/ intraocular lens implant  . Brain surgery  10/2003    SDH evac    There were no vitals taken for this visit.  Visit Diagnosis:  Weakness of left arm - Plan: Ot plan of care cert/re-cert  Decreased coordination - Plan: Ot plan of care cert/re-cert      Subjective Assessment -  01/31/15 1505    Symptoms Patient not feeling well today - she is reporting a cough without fever   Pertinent History see snapshot   Limitations Decreased ability to grade muscle activity, ataxia   Patient Stated Goals Pt reports that she would like to improve LUE AROM "get better motion".   Currently in Pain? No/denies   Pain Score 0-No pain          [facility] OT Assessment - 01/31/15 0001    Coordination   Box and Blocks left:25               OT Treatments/Exercises (OP) - 01/31/15 0001    ADLs   ADL Comments Reviewed long term goals related to bathing and dressing, and added a goal for showering / washing hair.     Shoulder Exercises: Seated   Other Seated Exercises UBE Level 2 for 6 min 3 min / 3 min backward                  OT Short Term Goals - 01/11/15 1452    OT SHORT TERM GOAL #1   Title Pt will report decreased pain LUE to 3/10 or less at rest   Status Achieved   OT SHORT TERM GOAL #2   Title Pt will demonstrate ability to don/doff shirt at [employer]  level using hemi dressing techniques and a/e PRN (LUE).   Status Achieved   OT SHORT TERM GOAL #3   Title Pt will be able to use LUE as assist for clothing management during toileting at supervision level   Status Achieved   OT SHORT TERM GOAL #4   Title Pt will be Mod I HEP/home program LUE   Status Achieved           OT Long Term Goals - 01/31/15 1627    OT LONG TERM GOAL #1   Title Pt will be Mod I UB dressing using hemi dressing techniques while seated (LUE)   Time 8   Period Weeks   Status On-going   OT LONG TERM GOAL #2   Title Pt will be Mod I clothing management related to toileting (don/doff pants) using LUE as assist    Status Achieved   OT LONG TERM GOAL #3   Title Pt will be supervision level for UB bathing techniques seated on tub bench/chair using LUE as assist   Status Achieved   OT LONG TERM GOAL #4   Title Pt will demonstrate increased coordination as seen by  improving Box and Blocks score by 10 or more blocks LUE.    Status Achieved   OT LONG TERM GOAL #5   Baseline unable to use left hand for this task   Time 8   Period Weeks   Status New               Plan - 01/31/15 1626    Clinical Impression Statement Patient has met some of her long term goals, and plan to continue for an additional 8 visits to address remaining long term goals relating to self care and left UE functioning   Pt will benefit from skilled therapeutic intervention in order to improve on the following deficits (Retired) Decreased activity tolerance;Decreased balance;Decreased coordination;Decreased cognition;Decreased strength;Impaired UE functional use;Impaired tone;Pain;Decreased range of motion;Decreased endurance   Rehab Potential Good   Clinical Impairments Affecting Rehab Potential Pt reports having impaired AROM/bilateral shoulder impairement since "May 2015" requiring assistance w/ ADL's and homemaking activities, h/o balance issues, pain, also dysmetria, choreatic type movement which impeded controlled functional use of UE's.     OT Frequency 2x / week   OT Duration 8 weeks   OT Treatment/Interventions Self-care/ADL training;Moist Heat;Therapeutic exercise;DME and/or AE instruction;Manual Therapy;Passive range of motion;Therapeutic exercises;Patient/family education;Therapeutic activities;Neuromuscular education;Balance training;Functional Mobility Training   Plan closed to open chain strengthening   Consulted and Agree with Plan of Care Patient        Problem List Patient Active Problem List   Diagnosis Date Noted  . Closed left humeral fracture 12/07/2014  . Fatigue 12/07/2014  . 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 Dystonia 08/10/2014  . Syncope and collapse 06/08/2014  . Orthostatic hypotension 06/08/2014  . 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 Traumatic brain injury with loss  of consciousness of 31 minutes to 59 minutes 12/14/2013  . Fall down stairs 12/11/2013  . Basilar skull fracture 12/11/2013  . Traumatic intracranial hemorrhage 12/11/2013  . Back pain 12/11/2013  . Subarachnoid hemorrhage following injury 12/09/2013  . Syncope 03/27/2013  . Clavicular fracture 03/27/2013  . COUGH 12/28/2007    Mariah Milling, OTR/L 01/31/2015, 4:44 PM  [facility] [address] [place], [place], [address] Phone: [phone]   Fax:  [phone]

## 2015-02-01 ENCOUNTER — Encounter: Payer: Self-pay | Admitting: Physical Medicine & Rehabilitation

## 2015-02-01 ENCOUNTER — Encounter: Payer: Medicare Other | Attending: Physical Medicine & Rehabilitation | Admitting: Physical Medicine & Rehabilitation

## 2015-02-01 ENCOUNTER — Encounter: Payer: Medicare Other | Admitting: Occupational Therapy

## 2015-02-01 ENCOUNTER — Other Ambulatory Visit: Payer: Self-pay | Admitting: Physical Medicine & Rehabilitation

## 2015-02-01 VITALS — BP 129/86 | HR 84 | Resp 14

## 2015-02-01 DIAGNOSIS — S0291XS Unspecified fracture of skull, sequela: Secondary | ICD-10-CM | POA: Diagnosis not present

## 2015-02-01 DIAGNOSIS — R2689 Other abnormalities of gait and mobility: Secondary | ICD-10-CM | POA: Insufficient documentation

## 2015-02-01 DIAGNOSIS — F329 Major depressive disorder, single episode, unspecified: Secondary | ICD-10-CM | POA: Diagnosis not present

## 2015-02-01 DIAGNOSIS — R5383 Other fatigue: Secondary | ICD-10-CM | POA: Diagnosis not present

## 2015-02-01 DIAGNOSIS — M75102 Unspecified rotator cuff tear or rupture of left shoulder, not specified as traumatic: Secondary | ICD-10-CM | POA: Diagnosis not present

## 2015-02-01 DIAGNOSIS — Z5181 Encounter for therapeutic drug level monitoring: Secondary | ICD-10-CM | POA: Diagnosis not present

## 2015-02-01 DIAGNOSIS — R569 Unspecified convulsions: Secondary | ICD-10-CM | POA: Diagnosis not present

## 2015-02-01 DIAGNOSIS — E780 Pure hypercholesterolemia: Secondary | ICD-10-CM | POA: Insufficient documentation

## 2015-02-01 DIAGNOSIS — S069X0S Unspecified intracranial injury without loss of consciousness, sequela: Secondary | ICD-10-CM | POA: Insufficient documentation

## 2015-02-01 DIAGNOSIS — S069X2S Unspecified intracranial injury with loss of consciousness of 31 minutes to 59 minutes, sequela: Secondary | ICD-10-CM | POA: Diagnosis not present

## 2015-02-01 DIAGNOSIS — G249 Dystonia, unspecified: Secondary | ICD-10-CM

## 2015-02-01 DIAGNOSIS — Z79899 Other long term (current) drug therapy: Secondary | ICD-10-CM | POA: Diagnosis not present

## 2015-02-01 DIAGNOSIS — R55 Syncope and collapse: Secondary | ICD-10-CM | POA: Diagnosis not present

## 2015-02-01 DIAGNOSIS — S42302S Unspecified fracture of shaft of humerus, left arm, sequela: Secondary | ICD-10-CM

## 2015-02-01 DIAGNOSIS — E559 Vitamin D deficiency, unspecified: Secondary | ICD-10-CM | POA: Diagnosis not present

## 2015-02-01 DIAGNOSIS — Z7409 Other reduced mobility: Secondary | ICD-10-CM | POA: Insufficient documentation

## 2015-02-01 DIAGNOSIS — K219 Gastro-esophageal reflux disease without esophagitis: Secondary | ICD-10-CM | POA: Insufficient documentation

## 2015-02-01 DIAGNOSIS — S065X0S Traumatic subdural hemorrhage without loss of consciousness, sequela: Secondary | ICD-10-CM | POA: Insufficient documentation

## 2015-02-01 DIAGNOSIS — R1310 Dysphagia, unspecified: Secondary | ICD-10-CM | POA: Diagnosis not present

## 2015-02-01 DIAGNOSIS — B9681 Helicobacter pylori [H. pylori] as the cause of diseases classified elsewhere: Secondary | ICD-10-CM | POA: Diagnosis not present

## 2015-02-01 MED ORDER — METHYLPHENIDATE HCL 10 MG PO TABS: 10.0000 mg | ORAL_TABLET | Freq: Two times a day (BID) | ORAL | Status: DC

## 2015-02-01 NOTE — Addendum Note (Signed)
Addended by: Alger Simons T on: 02/01/2015 12:08 PM   Modules accepted: Orders

## 2015-02-01 NOTE — Progress Notes (Signed)
Subjective:    Patient ID: Claudia Perez, female    DOB: 07-13-1951, 64 y.o.   MRN: [id number]  HPI  Claudia Perez is back regarding her left shoulder, TBI and gait associated issues. OT has worked diligently on her PROM. She is still complaining of being weak in the left shoulder and finds it difficult to perform ADL's without compensatory strategies taught by OT. Pain is minimal.   She is still feeling tired. Her TFT's were normal. Her mood has been good.     Pain Inventory Average Pain 1 Pain Right Now 1 My pain is intermittent  In the last 24 hours, has pain interfered with the following? General activity 4 Relation with others 0 Enjoyment of life 2 What TIME of day is your pain at its worst? evening Sleep (in general) Fair  Pain is worse with: walking, standing and some activites Pain improves with: rest and medication Relief from Meds: 2  Mobility walk with assistance use a walker how many minutes can you walk? 10 ability to climb steps?  no do you drive?  no  Function disabled: date disabled . I need assistance with the following:  bathing, meal prep, household duties and shopping  Neuro/Psych weakness trouble walking loss of taste or smell  Prior Studies Any changes since last visit?  no  Physicians involved in your care Any changes since last visit?  no   Family History  Problem Relation Age of Onset  . Hypertension Mother   . Cirrhosis Brother    History   Social History  . Marital Status: Married    Spouse Name: N/A  . Number of Children: N/A  . Years of Education: N/A   Social History Main Topics  . Smoking status: Never Smoker   . Smokeless tobacco: Never Used  . Alcohol Use: Yes     Comment: 03/27/2013 glass of wine "couple times/yr"  . Drug Use: No  . Sexual Activity: Not Currently   Other Topics Concern  . None   Social History Narrative   Past Surgical History  Procedure Laterality Date  . Appendectomy  1960's  . Eye surgery   02/2013    cataract extraction w/ intraocular lens implant  . Brain surgery  10/2003    SDH evac   Past Medical History  Diagnosis Date  . Mobility impaired   . Hypercholesteremia   . Vitamin D deficiency   . Shortness of breath     "even now, just talking" (03/27/2013)  . Hyperthyroidism     "a little bit; don't take RX for it" (03/27/2013)  . GERD (gastroesophageal reflux disease)   . Seizures     "think I might have; been blacking out several times in the past" (03/27/2013)  . Fall at home 03/27/2013    "lost consciousness; fractured right clavicle" (03/27/2013)  . Other closed skull fracture without mention of intracranial injury, unspecified state of consciousness   . Subdural hemorrhage   . Abnormality of gait    BP 129/86 mmHg  Pulse 84  Resp 14  SpO2 98%  Opioid Risk Score:   Fall Risk Score: Moderate Fall Risk (6-13 points) (previously educated and given handout)  Review of Systems  Constitutional: Positive for diaphoresis.  Respiratory: Positive for cough.   All other systems reviewed and are negative.      Objective:   Physical Exam  Constitutional: She is oriented to person, place, and time. She appears well-developed and well-nourished. No distress  HENT:  Head:  Normocephalic. No tenderness over frontal sinuses  Eyes: Conjunctivae are normal. Pupils are equal, round, and reactive to light.  Neck: Normal range of motion.  Cardiovascular: Normal rate and regular rhythm.  Respiratory: Effort normal and breath sounds normal. No respiratory distress. She has no wheezes.  GI: Soft. Bowel sounds are normal. She exhibits no distension. There is no tenderness.  Musculoskeletal: She exhibits no edema. Left shoulder easily rangeable in all directions. Lacks AROM with abduction and flexion in particular. She does have a callous over the upper third of humerus.  Neurological: She is alert and oriented to person, place, and time.  Good attention and focus. Gait is  wide-based, fairly stable. Still weak in trunk. Takes her time.    Psychiatric: She is pleasant as always.    Assessment/Plan:  1. 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 Multifactorial gait disorder/syncope--overall she continues to improve  -teds/fluids for bp management  3. Left humeral neck fracture  -has good PROM but lacks AROM especially with ABD, shoulder flexion. Minimal pain.  -exam suspicious for RTC, ?biceps tendon injuries----will order MRI -tramadol prn for more severe pain if needed.  4. Continue celexa for depression  5. Fatigue/attention deficits:  -ritalin refilled  -discussed the chronic effects of her BI which are likely playing a role -thyroid testing essentially normal--would not change regimen -discussed supplements such as B complex, Coenzyme Q10. She was also started on Vitamin D supplementation presumptively for low levels---this should help also  25 minutes of face to face patient care time were spent during this visit. All questions were encouraged and answered. i'll see her back in a month

## 2015-02-01 NOTE — Patient Instructions (Addendum)
CONSIDER TAKING A VITAMIN B COMPLEX. ?COENZYME Q10 ALSO MAY HELP  TAKE YOUR VITAMIN D SUPPLEMENT AS DIRECTED BY DR. BARNES. THIS SHOULD HELP YOUR ENERGY ALSO!!!

## 2015-02-02 LAB — PRESCRIPTION MONITORING PROFILE (SOLSTAS)
AMPHETAMINE/METH: NEGATIVE ng/mL
BARBITURATE SCREEN, URINE: NEGATIVE ng/mL
BENZODIAZEPINE SCREEN, URINE: NEGATIVE ng/mL
BUPRENORPHINE, URINE: NEGATIVE ng/mL
CANNABINOID SCRN UR: NEGATIVE ng/mL
COCAINE METABOLITES: NEGATIVE ng/mL
CREATININE, URINE: 224.56 mg/dL (ref >20.0)
Carisoprodol, Urine: NEGATIVE ng/mL
ECSTASY: NEGATIVE ng/mL
Fentanyl, Ur: NEGATIVE ng/mL
MEPERIDINE UR: NEGATIVE ng/mL
Methadone Screen, Urine: NEGATIVE ng/mL
Nitrites, Initial: NEGATIVE ug/mL
Opiate Screen, Urine: NEGATIVE ng/mL
Oxycodone Screen, Ur: NEGATIVE ng/mL
PH URINE, INITIAL: 5.4 pH (ref 4.5–8.9)
PROPOXYPHENE: NEGATIVE ng/mL
TRAMADOL UR: NEGATIVE ng/mL
Tapentadol, urine: NEGATIVE ng/mL
Zolpidem, Urine: NEGATIVE ng/mL

## 2015-02-02 LAB — PMP ALCOHOL METABOLITE (ETG): Ethyl Glucuronide (EtG): NEGATIVE ng/mL

## 2015-02-05 ENCOUNTER — Encounter: Payer: Medicare Other | Admitting: Occupational Therapy

## 2015-02-08 ENCOUNTER — Encounter: Payer: Medicare Other | Admitting: Occupational Therapy

## 2015-02-08 DIAGNOSIS — B9681 Helicobacter pylori [H. pylori] as the cause of diseases classified elsewhere: Secondary | ICD-10-CM | POA: Diagnosis not present

## 2015-02-12 ENCOUNTER — Ambulatory Visit: Payer: Medicare Other | Admitting: Occupational Therapy

## 2015-02-12 ENCOUNTER — Encounter: Payer: Medicare Other | Admitting: Occupational Therapy

## 2015-02-15 ENCOUNTER — Encounter: Payer: Self-pay | Admitting: Occupational Therapy

## 2015-02-15 ENCOUNTER — Encounter: Payer: Medicare Other | Admitting: Occupational Therapy

## 2015-02-15 ENCOUNTER — Ambulatory Visit: Payer: Medicare Other | Admitting: Occupational Therapy

## 2015-02-15 DIAGNOSIS — R278 Other lack of coordination: Secondary | ICD-10-CM

## 2015-02-15 DIAGNOSIS — R29898 Other symptoms and signs involving the musculoskeletal system: Secondary | ICD-10-CM

## 2015-02-15 DIAGNOSIS — R279 Unspecified lack of coordination: Secondary | ICD-10-CM

## 2015-02-15 DIAGNOSIS — M79602 Pain in left arm: Secondary | ICD-10-CM | POA: Diagnosis not present

## 2015-02-15 NOTE — Therapy (Signed)
[facility] [address] [place], [place], [address] Phone: [phone]   Fax:  [phone]  Occupational Therapy Treatment  Patient Details  Name: Claudia Perez MRN: [id number] Date of Birth: September 18, 1951 Referring Provider:  Leighton Ruff, MD  Encounter Date: 02/15/2015      OT End of Session - 02/15/15 1602    Visit Number 14   Number of Visits 25   Date for OT Re-Evaluation 03/28/15   Authorization Type Medicare part A & B   Authorization Time Period Will need Gcode at visit 20   OT Start Time 1447   OT Stop Time 1535   OT Time Calculation (min) 48 min   Equipment Utilized During Treatment yellow resistance band   Activity Tolerance Patient tolerated treatment well   Behavior During Therapy [facility] for tasks assessed/performed      Past Medical History  Diagnosis Date  . Mobility impaired   . Hypercholesteremia   . Vitamin D deficiency   . Shortness of breath     "even now, just talking" (03/27/2013)  . Hyperthyroidism     "a little bit; don't take RX for it" (03/27/2013)  . GERD (gastroesophageal reflux disease)   . Seizures     "think I might have; been blacking out several times in the past" (03/27/2013)  . Fall at home 03/27/2013    "lost consciousness; fractured right clavicle" (03/27/2013)  . Other closed skull fracture without mention of intracranial injury, unspecified state of consciousness   . Subdural hemorrhage   . Abnormality of gait     Past Surgical History  Procedure Laterality Date  . Appendectomy  1960's  . Eye surgery  02/2013    cataract extraction w/ intraocular lens implant  . Brain surgery  10/2003    [facility] evac    There were no vitals filed for this visit.  Visit Diagnosis:  Weakness of left arm  Decreased coordination      Subjective Assessment - 02/15/15 1551    Symptoms "I have been dressing myself.  My husband is not even in the room!"    also reports this takes 30  minutes to don shirt   Pertinent History see snapshot   Limitations Decreased ability to grade muscle activity, ataxia   Patient Stated Goals Pt reports that she would like to improve LUE AROM "get better motion".   Currently in Pain? Yes   Pain Score 0-No pain                    OT Treatments/Exercises (OP) - 02/15/15 0001    ADLs   Grooming patient now able to reach left hand to hair by flexing elbow, but has limited to no shoulder flexion / abduction available to maneuver hand around head as needed to wash hair.     UB Dressing Patient reports that her husband has not even been in the room when she has been dressing her upper body.  She indicates that this takes a very long time - but she feels accomplished in being able to perform without his assitance   Shoulder Exercises: Supine   External Rotation AROM;Strengthening;Left;Theraband  yellow - supine, difficulty replicating movements   Internal Rotation AROM;Strengthening;Left;5 reps;Theraband   Theraband Level (Shoulder Internal Rotation) Level 1 (Yellow)   Flexion AROM;Strengthening;Both;5 reps;Weights   Flexion Limitations 2 lb weighted dowel   Neurological Re-education Exercises   Elbow Extension AROM;Strengthening;Left;5 reps;Seated;Theraband  added to home exercise program   Other Weight-Bearing  Exercises 1 quadruped to address trunk control over stable left arm, and left arm strengthening                OT Education - 02/15/15 1601    Education provided Yes   Education Details resistance band for elbow extension - left   Person(s) Educated Patient   Methods Explanation;Demonstration   Comprehension Verbalized understanding;Returned demonstration          OT Short Term Goals - 01/11/15 1452    OT SHORT TERM GOAL #1   Title Pt will report decreased pain LUE to 3/10 or less at rest   Status Achieved   OT SHORT TERM GOAL #2   Title Pt will demonstrate ability to don/doff shirt at [place]  guard-supervision level using hemi dressing techniques and a/e PRN (LUE).   Status Achieved   OT SHORT TERM GOAL #3   Title Pt will be able to use LUE as assist for clothing management during toileting at supervision level   Status Achieved   OT SHORT TERM GOAL #4   Title Pt will be Mod I HEP/home program LUE   Status Achieved           OT Long Term Goals - 02/15/15 1607    OT LONG TERM GOAL #5   Title New 01/31/15 Patient will demonstrate ability to utilize left upper extremity to aide with bialteral task - washing hair without assitance while seated in shower   Status On-going               Plan - 02/15/15 1604    Clinical Impression Statement Patient continues to progress with ADL goals due to compensatory strategies.  She is still lacking shoulder flexion and abduction, as well as controlled elbow flexion.  Scheduled for MRI next week - possible surgical candidate   Pt will benefit from skilled therapeutic intervention in order to improve on the following deficits (Retired) Decreased activity tolerance;Decreased balance;Decreased coordination;Decreased cognition;Decreased strength;Impaired UE functional use;Impaired tone;Pain;Decreased range of motion;Decreased endurance   Rehab Potential Good   Clinical Impairments Affecting Rehab Potential Pt reports having impaired AROM/bilateral shoulder impairement since "May 2015" requiring assistance w/ ADL's and homemaking activities, h/o balance issues, pain, also dysmetria, choreatic type movement which impeded controlled functional use of UE's.     OT Frequency 2x / week   OT Duration 8 weeks   OT Treatment/Interventions Self-care/ADL training;Moist Heat;Therapeutic exercise;DME and/or AE instruction;Manual Therapy;Passive range of motion;Therapeutic exercises;Patient/family education;Therapeutic activities;Neuromuscular education;Balance training;Functional Mobility Training   Plan closed to open chain strengthening, add to HEP -  theraband IR/ER, shoulder extension   OT Home Exercise Plan ongoing   Consulted and Agree with Plan of Care Patient        Problem List Patient Active Problem List   Diagnosis Date Noted  . Left rotator cuff tear 02/01/2015  . Closed left humeral fracture 12/07/2014  . Fatigue 12/07/2014  . 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 Dystonia 08/10/2014  . Syncope and collapse 06/08/2014  . Orthostatic hypotension 06/08/2014  . 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 Fall down stairs 12/11/2013  . Basilar skull fracture 12/11/2013  . Traumatic intracranial hemorrhage 12/11/2013  . Back pain 12/11/2013  . Subarachnoid hemorrhage following injury 12/09/2013  . Syncope 03/27/2013  . Clavicular fracture 03/27/2013  . COUGH 12/28/2007    Gellert,  Algernon Huxley, OTR/L 02/15/2015, 4:08 PM  [facility] [address] [place], [place], [address] Phone: [phone]   Fax:  [phone]

## 2015-02-15 NOTE — Progress Notes (Signed)
Urine drug screen for this encounter is consistent.

## 2015-02-15 NOTE — Patient Instructions (Signed)
Yellow Resistance Band - elbow strengthening  Starting position: While seated, place Right hand holding theraband at mid chest ("glue right hand to chest) Place Left hand holding theraband close to right hand - with small amount of band between the two hands.  Movement: Keep right hand still on chest. Push left hand down toward left thigh, straightening left elbow. Hold for count of 3 Do not allow left arm to "snap" back into bent position, control the elbow bending to return to starting position.  Complete 5 repetitions.  Rest repeat 3 times.

## 2015-02-18 ENCOUNTER — Encounter: Payer: Self-pay | Admitting: Occupational Therapy

## 2015-02-18 ENCOUNTER — Ambulatory Visit: Payer: Medicare Other | Admitting: Occupational Therapy

## 2015-02-18 DIAGNOSIS — R279 Unspecified lack of coordination: Secondary | ICD-10-CM

## 2015-02-18 DIAGNOSIS — R270 Ataxia, unspecified: Secondary | ICD-10-CM

## 2015-02-18 DIAGNOSIS — R29898 Other symptoms and signs involving the musculoskeletal system: Secondary | ICD-10-CM

## 2015-02-18 DIAGNOSIS — M79602 Pain in left arm: Secondary | ICD-10-CM | POA: Diagnosis not present

## 2015-02-18 DIAGNOSIS — R278 Other lack of coordination: Secondary | ICD-10-CM

## 2015-02-18 NOTE — Therapy (Signed)
[facility] [address] [place], [place], [address] Phone: [phone]   Fax:  [phone]  Occupational Therapy Treatment  Patient Details  Name: Claudia Perez MRN: [id number] Date of Birth: 1951/10/18 Referring Provider:  Leighton Ruff, MD  Encounter Date: 02/18/2015      OT End of Session - 02/18/15 1658    Visit Number 15   Number of Visits 25   Date for OT Re-Evaluation 03/28/15   Authorization Type Medicare part A & B   Authorization Time Period Will need Gcode at visit 20   OT Start Time 1447   OT Stop Time 1530   OT Time Calculation (min) 43 min   Equipment Utilized During Treatment yellow resistance band   Activity Tolerance Patient tolerated treatment well   Behavior During Therapy [facility] for tasks assessed/performed      Past Medical History  Diagnosis Date  . Mobility impaired   . Hypercholesteremia   . Vitamin D deficiency   . Shortness of breath     "even now, just talking" (03/27/2013)  . Hyperthyroidism     "a little bit; don't take RX for it" (03/27/2013)  . GERD (gastroesophageal reflux disease)   . Seizures     "think I might have; been blacking out several times in the past" (03/27/2013)  . Fall at home 03/27/2013    "lost consciousness; fractured right clavicle" (03/27/2013)  . Other closed skull fracture without mention of intracranial injury, unspecified state of consciousness   . Subdural hemorrhage   . Abnormality of gait     Past Surgical History  Procedure Laterality Date  . Appendectomy  1960's  . Eye surgery  02/2013    cataract extraction w/ intraocular lens implant  . Brain surgery  10/2003    [facility] evac    There were no vitals filed for this visit.  Visit Diagnosis:  Weakness of left arm  Decreased coordination  Ataxia      Subjective Assessment - 02/18/15 1644    Symptoms I feel much better today   Pertinent History see snapshot   Limitations Decreased ability  to grade muscle activity, ataxia   Patient Stated Goals Pt reports that she would like to improve LUE AROM "get better motion".   Currently in Pain? No/denies   Pain Score 0-No pain                    OT Treatments/Exercises (OP) - 02/18/15 0001    Neurological Re-education Exercises   Other Exercises 1 Neuromuscular reeducation to address isolated muscle control.  This is difficult to consistently ascertain as patient with significant whole body ataxiic movements, and she often lacks volitional control.  Appreciate Dr Charm Barges comments regarding bicep function as well as rotator cuff involvement following humeral fracture.  Today patient able to isolate left elbow flexion while supine through 90% of full range with intermittent cueing.  Able to palpate bicep muscle during elbow flexion.  Patient however, with significant weakness in shoulder flexors, even in supported positions.  Patient occasionally able to sustain a position with place and hold activities, but has difficulty initiaiting, and terminating muscle activity.  Patient showing improved external rotation in supine position with very light resistance                 OT Education - 02/18/15 1658    Education provided Yes   Education Details resistance nband for elbow flexion - left   Person(s) Educated Patient  Methods Explanation;Demonstration   Comprehension Verbalized understanding          OT Short Term Goals - 01/11/15 1452    OT SHORT TERM GOAL #1   Title Pt will report decreased pain LUE to 3/10 or less at rest   Status Achieved   OT SHORT TERM GOAL #2   Title Pt will demonstrate ability to don/doff shirt at [place] guard-supervision level using hemi dressing techniques and a/e PRN (LUE).   Status Achieved   OT SHORT TERM GOAL #3   Title Pt will be able to use LUE as assist for clothing management during toileting at supervision level   Status Achieved   OT SHORT TERM GOAL #4   Title Pt will be Mod  I HEP/home program LUE   Status Achieved           OT Long Term Goals - 02/15/15 1607    OT LONG TERM GOAL #5   Title New 01/31/15 Patient will demonstrate ability to utilize left upper extremity to aide with bialteral task - washing hair without assitance while seated in shower   Status On-going               Plan - 02/18/15 1659    Clinical Impression Statement Patient shows steady improvement in isolated muscle strength in left UE   Pt will benefit from skilled therapeutic intervention in order to improve on the following deficits (Retired) Decreased activity tolerance;Decreased balance;Decreased coordination;Decreased cognition;Decreased strength;Impaired UE functional use;Impaired tone;Pain;Decreased range of motion;Decreased endurance   Rehab Potential Good   Clinical Impairments Affecting Rehab Potential Pt reports having impaired AROM/bilateral shoulder impairement since "May 2015" requiring assistance w/ ADL's and homemaking activities, h/o balance issues, pain, also dysmetria, choreatic type movement which impeded controlled functional use of UE's.     OT Duration 8 weeks   Plan Continue to add to HEP- External rotation - shoulder flexion (prone?)   OT Home Exercise Plan ongoing   Consulted and Agree with Plan of Care Patient        Problem List Patient Active Problem List   Diagnosis Date Noted  . Left rotator cuff tear 02/01/2015  . Closed left humeral fracture 12/07/2014  . Fatigue 12/07/2014  . 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 Dystonia 08/10/2014  . Syncope and collapse 06/08/2014  . Orthostatic hypotension 06/08/2014  . 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 Fall down stairs 12/11/2013  . Basilar skull fracture 12/11/2013  . Traumatic intracranial hemorrhage 12/11/2013  . Back pain 12/11/2013   . Subarachnoid hemorrhage following injury 12/09/2013  . Syncope 03/27/2013  . Clavicular fracture 03/27/2013  . COUGH 12/28/2007    Mariah Milling , OTR/L  02/18/2015, 5:01 PM  [facility] [address] [place], [place], [address] Phone: [phone]   Fax:  [phone]

## 2015-02-18 NOTE — Patient Instructions (Signed)
Yellow resistance band exercise elbow bending  While lying on your back, place right hand on right hip and hold therapy band tightly With left hand start with your arm in a straight position, and bend elbow so that your hand moves  toward your shoulder. Repeat 5 times.  Complete three sets.

## 2015-02-20 ENCOUNTER — Ambulatory Visit
Admission: RE | Admit: 2015-02-20 | Discharge: 2015-02-20 | Disposition: A | Discharge status: Home / Self Care | Payer: Medicare Other | Source: Ambulatory Visit | Attending: Physical Medicine & Rehabilitation | Admitting: Physical Medicine & Rehabilitation

## 2015-02-20 DIAGNOSIS — S42302S Unspecified fracture of shaft of humerus, left arm, sequela: Secondary | ICD-10-CM

## 2015-02-20 DIAGNOSIS — M75102 Unspecified rotator cuff tear or rupture of left shoulder, not specified as traumatic: Secondary | ICD-10-CM

## 2015-02-20 DIAGNOSIS — S42292K Other displaced fracture of upper end of left humerus, subsequent encounter for fracture with nonunion: Secondary | ICD-10-CM | POA: Diagnosis not present

## 2015-02-20 DIAGNOSIS — M25512 Pain in left shoulder: Secondary | ICD-10-CM | POA: Diagnosis not present

## 2015-02-21 ENCOUNTER — Encounter: Payer: Self-pay | Admitting: Occupational Therapy

## 2015-02-21 ENCOUNTER — Ambulatory Visit: Payer: Medicare Other | Admitting: Occupational Therapy

## 2015-02-21 DIAGNOSIS — M79602 Pain in left arm: Secondary | ICD-10-CM | POA: Diagnosis not present

## 2015-02-21 DIAGNOSIS — R279 Unspecified lack of coordination: Secondary | ICD-10-CM | POA: Diagnosis not present

## 2015-02-21 DIAGNOSIS — R29898 Other symptoms and signs involving the musculoskeletal system: Secondary | ICD-10-CM | POA: Diagnosis not present

## 2015-02-21 DIAGNOSIS — R278 Other lack of coordination: Secondary | ICD-10-CM

## 2015-02-21 DIAGNOSIS — R270 Ataxia, unspecified: Secondary | ICD-10-CM

## 2015-02-21 NOTE — Patient Instructions (Signed)
Bicep curl  Seated with forearms supported on table. Hold 1 lb weight in left hand. Curl left elbow so hand moves toward shoulder and returns GENTLY to table.  10 reps with palm facing upward 10 reps with thumb facing upward  Repeat series 3 times, 3 different times / day.

## 2015-02-21 NOTE — Therapy (Signed)
[facility] [address] [place], [place], [address] Phone: [phone]   Fax:  [phone]  Occupational Therapy Treatment  Patient Details  Name: Claudia Perez MRN: [id number] Date of Birth: February 17, 1951 Referring Provider:  Leighton Ruff, MD  Encounter Date: 02/21/2015      OT End of Session - 02/21/15 2008    Visit Number 16   Number of Visits 25   Date for OT Re-Evaluation 03/28/15   Authorization Type Medicare part A & B   Authorization Time Period Will need Gcode at visit 20   OT Start Time 1445   OT Stop Time 1530   OT Time Calculation (min) 45 min   Equipment Utilized During Treatment 1 lb dumbell   Activity Tolerance Patient tolerated treatment well   Behavior During Therapy [facility] for tasks assessed/performed      Past Medical History  Diagnosis Date  . Mobility impaired   . Hypercholesteremia   . Vitamin D deficiency   . Shortness of breath     "even now, just talking" (03/27/2013)  . Hyperthyroidism     "a little bit; don't take RX for it" (03/27/2013)  . GERD (gastroesophageal reflux disease)   . Seizures     "think I might have; been blacking out several times in the past" (03/27/2013)  . Fall at home 03/27/2013    "lost consciousness; fractured right clavicle" (03/27/2013)  . Other closed skull fracture without mention of intracranial injury, unspecified state of consciousness   . Subdural hemorrhage   . Abnormality of gait     Past Surgical History  Procedure Laterality Date  . Appendectomy  1960's  . Eye surgery  02/2013    cataract extraction w/ intraocular lens implant  . Brain surgery  10/2003    SDH evac    There were no vitals filed for this visit.  Visit Diagnosis:  Weakness of left arm  Ataxia  Decreased coordination      Subjective Assessment - 02/21/15 1959    Symptoms I am making progress   Pertinent History see snapshot   Limitations Decreased ability to grade  muscle activity, ataxia   Patient Stated Goals Pt reports that she would like to improve LUE AROM "get better motion".   Currently in Pain? No/denies   Pain Score 0-No pain                    OT Treatments/Exercises (OP) - 02/21/15 0001    Shoulder Exercises: Supine   External Rotation AROM;Left;5 reps  unable to accept resistance   Shoulder Exercises: Seated   Elevation AROM;Left;5 reps   External Rotation AROM;Left;5 reps  unable to accept resistance   Neurological Re-education Exercises   Shoulder Flexion AAROM;Left  prone position to reduce impact of gravity. cues for biomech   Shoulder ABduction AAROM;Left;10 reps   Shoulder Protraction --  prone with physical cues for biomechanics   Elbow Flexion AROM   Elbow Extension Strengthening;Left;10 reps  1 lb dumbell   Wrist Extension AROM;Left;10 reps  1 lb   Wrist Radial Deviation AROM;Strengthening;Left;10 reps  1 lb   Wrist Ulnar Deviation AROM;Strengthening;Left;10 reps  1 lb   Other Exercises 1 Place and hold exercise in supine - working to control midrange of shoulder flexion with elbow extension - able to sustain shoulder flexion to 90 degrees with elbow extension and accept minimal resistance.  Controlled descent elbow flexion with shoulder extension  OT Education - 02/21/15 2008    Education provided Yes   Education Details bicep curl seated with resistance   Person(s) Educated Patient   Methods Explanation;Demonstration   Comprehension Verbalized understanding;Returned demonstration          OT Short Term Goals - 01/11/15 1452    OT SHORT TERM GOAL #1   Title Pt will report decreased pain LUE to 3/10 or less at rest   Status Achieved   OT SHORT TERM GOAL #2   Title Pt will demonstrate ability to don/doff shirt at [place] guard-supervision level using hemi dressing techniques and a/e PRN (LUE).   Status Achieved   OT SHORT TERM GOAL #3   Title Pt will be able to use LUE as  assist for clothing management during toileting at supervision level   Status Achieved   OT SHORT TERM GOAL #4   Title Pt will be Mod I HEP/home program LUE   Status Achieved           OT Long Term Goals - 02/15/15 1607    OT LONG TERM GOAL #5   Title New 01/31/15 Patient will demonstrate ability to utilize left upper extremity to aide with bialteral task - washing hair without assitance while seated in shower   Status On-going               Plan - 02/21/15 2010    Clinical Impression Statement Patient showing steady improvement in isolated muscle activation in left upper extremity   Pt will benefit from skilled therapeutic intervention in order to improve on the following deficits (Retired) Decreased activity tolerance;Decreased balance;Decreased coordination;Decreased cognition;Decreased strength;Impaired UE functional use;Impaired tone;Pain;Decreased range of motion;Decreased endurance   Rehab Potential Good   Clinical Impairments Affecting Rehab Potential Pt reports having impaired AROM/bilateral shoulder impairement since "May 2015" requiring assistance w/ ADL's and homemaking activities, h/o balance issues, pain, also dysmetria, choreatic type movement which impeded controlled functional use of UE's.     OT Frequency 2x / week   OT Duration 8 weeks   OT Treatment/Interventions Self-care/ADL training;Moist Heat;Therapeutic exercise;DME and/or AE instruction;Manual Therapy;Passive range of motion;Therapeutic exercises;Patient/family education;Therapeutic activities;Neuromuscular education;Balance training;Functional Mobility Training   Plan continue to add to HEP - shoulder flexion, external rotation, elbow flexion - with optimal biomechanics   OT Home Exercise Plan ongoing   Consulted and Agree with Plan of Care Patient        Problem List Patient Active Problem List   Diagnosis Date Noted  . Left rotator cuff tear 02/01/2015  . Closed left humeral fracture 12/07/2014   . Fatigue 12/07/2014  . 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 Dystonia 08/10/2014  . Syncope and collapse 06/08/2014  . Orthostatic hypotension 06/08/2014  . 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 Fall down stairs 12/11/2013  . Basilar skull fracture 12/11/2013  . Traumatic intracranial hemorrhage 12/11/2013  . Back pain 12/11/2013  . Subarachnoid hemorrhage following injury 12/09/2013  . Syncope 03/27/2013  . Clavicular fracture 03/27/2013  . COUGH 12/28/2007    Mariah Milling, OTR/L 02/21/2015, [place] [address] [place], [place], [address] Phone: [phone]   Fax:  [phone]

## 2015-02-25 ENCOUNTER — Encounter: Payer: Self-pay | Admitting: Occupational Therapy

## 2015-02-25 ENCOUNTER — Ambulatory Visit: Payer: Medicare Other | Admitting: Occupational Therapy

## 2015-02-25 DIAGNOSIS — R279 Unspecified lack of coordination: Secondary | ICD-10-CM | POA: Diagnosis not present

## 2015-02-25 DIAGNOSIS — R29898 Other symptoms and signs involving the musculoskeletal system: Secondary | ICD-10-CM

## 2015-02-25 DIAGNOSIS — M79602 Pain in left arm: Secondary | ICD-10-CM | POA: Diagnosis not present

## 2015-02-25 NOTE — Therapy (Signed)
[facility] [address] [place], [place], [address] Phone: [phone]   Fax:  [phone]  Occupational Therapy Treatment  Patient Details  Name: Claudia Perez MRN: [id number] Date of Birth: Aug 27, 1951 Referring Provider:  Leighton Ruff, MD  Encounter Date: 02/25/2015      OT End of Session - 02/25/15 1712    Visit Number 17   Number of Visits 25   Date for OT Re-Evaluation 03/28/15   Authorization Type Medicare part A & B   Authorization Time Period Will need Gcode at visit 20   OT Start Time 1442   OT Stop Time 1535   OT Time Calculation (min) 53 min   Equipment Utilized During Treatment 1 lb, 2 lb dumbell, hemiglide, UE ranger on wall   Activity Tolerance Patient tolerated treatment well   Behavior During Therapy [facility] for tasks assessed/performed      Past Medical History  Diagnosis Date  . Mobility impaired   . Hypercholesteremia   . Vitamin D deficiency   . Shortness of breath     "even now, just talking" (03/27/2013)  . Hyperthyroidism     "a little bit; don't take RX for it" (03/27/2013)  . GERD (gastroesophageal reflux disease)   . Seizures     "think I might have; been blacking out several times in the past" (03/27/2013)  . Fall at home 03/27/2013    "lost consciousness; fractured right clavicle" (03/27/2013)  . Other closed skull fracture without mention of intracranial injury, unspecified state of consciousness   . Subdural hemorrhage   . Abnormality of gait     Past Surgical History  Procedure Laterality Date  . Appendectomy  1960's  . Eye surgery  02/2013    cataract extraction w/ intraocular lens implant  . Brain surgery  10/2003    [facility] evac    There were no vitals filed for this visit.  Visit Diagnosis:  Weakness of left arm      Subjective Assessment - 02/25/15 1705    Symptoms I am making progress   Pertinent History see snapshot   Limitations Decreased ability to grade  muscle activity, ataxia   Patient Stated Goals Pt reports that she would like to improve LUE AROM "get better motion".   Currently in Pain? No/denies   Pain Score 0-No pain                    OT Treatments/Exercises (OP) - 02/25/15 0001    Neurological Re-education Exercises   Shoulder Flexion AAROM;Strengthening;Left;5 reps;Seated  UE Ranger with assist for overhead reach, place and hold   Shoulder ABduction AAROM;Left;10 reps;Seated  using long PVC pipe as pivot on ground to increase abduction   Elbow Flexion AROM;Strengthening;Left;10 reps;Bar weights/barbell   Other Exercises 1 Place and hold with left UE overhead.  Only able to sustain 1-2 seconds                OT Education - 02/25/15 1711    Education provided Yes   Education Details Additional HEP exercises - seated AAROM with cane for left shoulder flexion, abduction, external rotation   Person(s) Educated Patient   Methods Explanation;Demonstration   Comprehension Verbalized understanding;Returned demonstration          OT Short Term Goals - 01/11/15 1452    OT SHORT TERM GOAL #1   Title Pt will report decreased pain LUE to 3/10 or less at rest   Status Achieved   OT  SHORT TERM GOAL #2   Title Pt will demonstrate ability to don/doff shirt at [place] guard-supervision level using hemi dressing techniques and a/e PRN (LUE).   Status Achieved   OT SHORT TERM GOAL #3   Title Pt will be able to use LUE as assist for clothing management during toileting at supervision level   Status Achieved   OT SHORT TERM GOAL #4   Title Pt will be Mod I HEP/home program LUE   Status Achieved           OT Long Term Goals - 02/15/15 1607    OT LONG TERM GOAL #5   Title New 01/31/15 Patient will demonstrate ability to utilize left upper extremity to aide with bialteral task - washing hair without assitance while seated in shower   Status On-going               Plan - 02/25/15 1713    Clinical Impression  Statement Continues to show steady improvement   Pt will benefit from skilled therapeutic intervention in order to improve on the following deficits (Retired) Decreased activity tolerance;Decreased balance;Decreased coordination;Decreased cognition;Decreased strength;Impaired UE functional use;Impaired tone;Pain;Decreased range of motion;Decreased endurance   Rehab Potential Good   Clinical Impairments Affecting Rehab Potential Pt reports having impaired AROM/bilateral shoulder impairement since "May 2015" requiring assistance w/ ADL's and homemaking activities, h/o balance issues, pain, also dysmetria, choreatic type movement which impeded controlled functional use of UE's.     OT Frequency 2x / week   OT Duration 8 weeks   Plan continue to update HEP  as patient progresses   OT Home Exercise Plan ongoing   Consulted and Agree with Plan of Care Patient        Problem List Patient Active Problem List   Diagnosis Date Noted  . Left rotator cuff tear 02/01/2015  . Closed left humeral fracture 12/07/2014  . Fatigue 12/07/2014  . 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 Dystonia 08/10/2014  . Syncope and collapse 06/08/2014  . Orthostatic hypotension 06/08/2014  . 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 Fall down stairs 12/11/2013  . Basilar skull fracture 12/11/2013  . Traumatic intracranial hemorrhage 12/11/2013  . Back pain 12/11/2013  . Subarachnoid hemorrhage following injury 12/09/2013  . Syncope 03/27/2013  . Clavicular fracture 03/27/2013  . COUGH 12/28/2007    Mariah Milling, OTR/L 02/25/2015, 5:14 PM  [facility] [address] [place], [place], [address] Phone: [phone]   Fax:  [phone]

## 2015-02-25 NOTE — Patient Instructions (Signed)
Seated with cane in left hand, cane tip on floor to the left of left foot.  Tip cane forward by moving left hand forward (cane tip remains on the floor) Tip cane to left (away from body) and stretch elbow to longest position Make clockwise circle with cane Make counterwise circle with cane. Complet each  exercise 10 times.  Repeat three entire sets.

## 2015-02-27 ENCOUNTER — Ambulatory Visit: Payer: Medicare Other | Admitting: Occupational Therapy

## 2015-02-27 ENCOUNTER — Encounter: Payer: Self-pay | Admitting: Occupational Therapy

## 2015-02-27 DIAGNOSIS — M79602 Pain in left arm: Secondary | ICD-10-CM

## 2015-02-27 DIAGNOSIS — R279 Unspecified lack of coordination: Secondary | ICD-10-CM | POA: Diagnosis not present

## 2015-02-27 DIAGNOSIS — R29898 Other symptoms and signs involving the musculoskeletal system: Secondary | ICD-10-CM | POA: Diagnosis not present

## 2015-02-27 DIAGNOSIS — R270 Ataxia, unspecified: Secondary | ICD-10-CM

## 2015-02-27 NOTE — Therapy (Signed)
[facility] [address] [place], [place], [address] Phone: [phone]   Fax:  [phone]  Occupational Therapy Treatment  Patient Details  Name: Claudia Perez MRN: [id number] Date of Birth: 01/20/1951 Referring Provider:  Leighton Ruff, MD  Encounter Date: 02/27/2015      OT End of Session - 02/27/15 1447    Visit Number 18   Number of Visits 25   Date for OT Re-Evaluation 03/28/15   Authorization Type Medicare part A & B   Authorization Time Period Will need Gcode at visit 20   OT Start Time 1400   OT Stop Time 1445   OT Time Calculation (min) 45 min   Equipment Utilized During Treatment UBE   Activity Tolerance Patient tolerated treatment well   Behavior During Therapy [facility] for tasks assessed/performed      Past Medical History  Diagnosis Date  . Mobility impaired   . Hypercholesteremia   . Vitamin D deficiency   . Shortness of breath     "even now, just talking" (03/27/2013)  . Hyperthyroidism     "a little bit; don't take RX for it" (03/27/2013)  . GERD (gastroesophageal reflux disease)   . Seizures     "think I might have; been blacking out several times in the past" (03/27/2013)  . Fall at home 03/27/2013    "lost consciousness; fractured right clavicle" (03/27/2013)  . Other closed skull fracture without mention of intracranial injury, unspecified state of consciousness   . Subdural hemorrhage   . Abnormality of gait     Past Surgical History  Procedure Laterality Date  . Appendectomy  1960's  . Eye surgery  02/2013    cataract extraction w/ intraocular lens implant  . Brain surgery  10/2003    [facility] evac    There were no vitals filed for this visit.  Visit Diagnosis:  Weakness of left arm  Ataxia  Left arm pain      Subjective Assessment - 02/27/15 1414    Symptoms I started cutting onions - used both hands!   Pertinent History see snapshot   Limitations Decreased ability to grade  muscle activity, ataxia   Patient Stated Goals Pt reports that she would like to improve LUE AROM "get better motion".   Currently in Pain? No/denies   Pain Score 0-No pain                    OT Treatments/Exercises (OP) - 02/27/15 0001    Transfers   Transfers Sit to Stand   Sit to Stand 5: Supervision   Sit to Stand Details (indicate cue type and reason) cueing to manage upper body over base of support versus behind base of support   Comments Patient now able to transition from sidelying to sitting to left side using left arm for support   ADLs   Cooking Patient indicates that she has returned to assisting with prep in the kitchen e.g. cutting onions   Shoulder Exercises: Supine   Flexion AROM;Strengthening;Both;5 reps;Weights   Shoulder Exercises: Seated   Other Seated Exercises UBE Level 2.5 for 5 min forward, 5 min backward   Neurological Re-education Exercises   Other Exercises 1 Place and hold with resistance shoulder flexion at 90 degrees and above while supine                OT Education - 02/27/15 1447    Education provided Yes   Education Details balance with standing to  manage body over versus behind base of support   Person(s) Educated Patient   Methods Explanation;Demonstration   Comprehension Tactile cues required          OT Short Term Goals - 01/11/15 1452    OT SHORT TERM GOAL #1   Title Pt will report decreased pain LUE to 3/10 or less at rest   Status Achieved   OT SHORT TERM GOAL #2   Title Pt will demonstrate ability to don/doff shirt at [place] guard-supervision level using hemi dressing techniques and a/e PRN (LUE).   Status Achieved   OT SHORT TERM GOAL #3   Title Pt will be able to use LUE as assist for clothing management during toileting at supervision level   Status Achieved   OT SHORT TERM GOAL #4   Title Pt will be Mod I HEP/home program LUE   Status Achieved           OT Long Term Goals - 02/15/15 1607    OT LONG  TERM GOAL #5   Title New 01/31/15 Patient will demonstrate ability to utilize left upper extremity to aide with bialteral task - washing hair without assitance while seated in shower   Status On-going               Plan - 02/27/15 1448    Clinical Impression Statement Steady improvement   Pt will benefit from skilled therapeutic intervention in order to improve on the following deficits (Retired) Decreased activity tolerance;Decreased balance;Decreased coordination;Decreased cognition;Decreased strength;Impaired UE functional use;Impaired tone;Pain;Decreased range of motion;Decreased endurance   Rehab Potential Good   Clinical Impairments Affecting Rehab Potential Pt reports having impaired AROM/bilateral shoulder impairement since "May 2015" requiring assistance w/ ADL's and homemaking activities, h/o balance issues, pain, also dysmetria, choreatic type movement which impeded controlled functional use of UE's.     OT Frequency 2x / week   OT Duration 4 weeks   OT Treatment/Interventions Self-care/ADL training;Moist Heat;Therapeutic exercise;DME and/or AE instruction;Manual Therapy;Passive range of motion;Therapeutic exercises;Patient/family education;Therapeutic activities;Neuromuscular education;Balance training;Functional Mobility Training   OT Home Exercise Plan ongoing   Consulted and Agree with Plan of Care Patient        Problem List Patient Active Problem List   Diagnosis Date Noted  . Left rotator cuff tear 02/01/2015  . Closed left humeral fracture 12/07/2014  . Fatigue 12/07/2014  . 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 Dystonia 08/10/2014  . Syncope and collapse 06/08/2014  . Orthostatic hypotension 06/08/2014  . 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 Fall down stairs 12/11/2013  . Basilar skull fracture  12/11/2013  . Traumatic intracranial hemorrhage 12/11/2013  . Back pain 12/11/2013  . Subarachnoid hemorrhage following injury 12/09/2013  . Syncope 03/27/2013  . Clavicular fracture 03/27/2013  . COUGH 12/28/2007    Mariah Milling, OTR/L 02/27/2015, 2:49 PM  [facility] [address] [place], [place], [address] Phone: [phone]   Fax:  [phone]

## 2015-03-06 ENCOUNTER — Ambulatory Visit: Payer: Medicare Other | Attending: Orthopedic Surgery | Admitting: Occupational Therapy

## 2015-03-06 ENCOUNTER — Encounter: Payer: Self-pay | Admitting: Occupational Therapy

## 2015-03-06 DIAGNOSIS — M79602 Pain in left arm: Secondary | ICD-10-CM | POA: Insufficient documentation

## 2015-03-06 DIAGNOSIS — R278 Other lack of coordination: Secondary | ICD-10-CM

## 2015-03-06 DIAGNOSIS — R29898 Other symptoms and signs involving the musculoskeletal system: Secondary | ICD-10-CM | POA: Insufficient documentation

## 2015-03-06 DIAGNOSIS — R270 Ataxia, unspecified: Secondary | ICD-10-CM

## 2015-03-06 DIAGNOSIS — R279 Unspecified lack of coordination: Secondary | ICD-10-CM | POA: Diagnosis not present

## 2015-03-06 NOTE — Therapy (Signed)
[facility] [address] [place], [place], [address] Phone: [phone]   Fax:  [phone]  Occupational Therapy Treatment  Patient Details  Name: Claudia Perez MRN: [id number] Date of Birth: 27-Jun-1951 Referring Provider:  Leighton Ruff, MD  Encounter Date: 03/06/2015      OT End of Session - 03/06/15 1658    Visit Number 19   Number of Visits 25   Date for OT Re-Evaluation 03/28/15   Authorization Type Medicare part A & B   Authorization Time Period Will need Gcode at visit 20   OT Start Time 1535   OT Stop Time 1617   OT Time Calculation (min) 42 min   Equipment Utilized During Treatment UBE - 4 min forward / 4 backward at level 2.5   Activity Tolerance Patient tolerated treatment well   Behavior During Therapy [facility] for tasks assessed/performed      Past Medical History  Diagnosis Date  . Mobility impaired   . Hypercholesteremia   . Vitamin D deficiency   . Shortness of breath     "even now, just talking" (03/27/2013)  . Hyperthyroidism     "a little bit; don't take RX for it" (03/27/2013)  . GERD (gastroesophageal reflux disease)   . Seizures     "think I might have; been blacking out several times in the past" (03/27/2013)  . Fall at home 03/27/2013    "lost consciousness; fractured right clavicle" (03/27/2013)  . Other closed skull fracture without mention of intracranial injury, unspecified state of consciousness   . Subdural hemorrhage   . Abnormality of gait     Past Surgical History  Procedure Laterality Date  . Appendectomy  1960's  . Eye surgery  02/2013    cataract extraction w/ intraocular lens implant  . Brain surgery  10/2003    [facility] evac    There were no vitals filed for this visit.  Visit Diagnosis:  Decreased coordination  Ataxia  Weakness of left arm      Subjective Assessment - 03/06/15 1646    Subjective  I am doing my homework - it is getting easier   Pertinent History  see snapshot   Limitations Decreased ability to grade muscle activity, ataxia   Patient Stated Goals Pt reports that she would like to improve LUE AROM "get better motion".   Currently in Pain? No/denies   Pain Score 0-No pain                    OT Treatments/Exercises (OP) - 03/06/15 0001    ADLs   Home Maintenance Practiced using bilateral upper extremities for homemaking task - placing pillow case on pillow in standing and maintaining balance   Shoulder Exercises: Seated   Flexion AAROM;Strengthening;10 reps;Weights  3lbs on cane   Abduction AAROM;Strengthening;10 reps;Weights  3 lbs on cane   Shoulder Exercises: Standing   Flexion AAROM;Strengthening;Left  reaching overhead with UE ranger on wall.    Flexion Limitations Physical cueing to allow head of humerus to glide downward in [facility] joint to allow proximal end of humerus to elevate   Other Standing Exercises Place and hold exercise reaching overhead AAROM able to sustain 3-5, then 5-10 seconds in end range shoulder flexion - with intermittent min assistance for postural stability   Neurological Re-education Exercises   Other Exercises 1 Encouraged patient to perform shoulder and elbow exercises in isolation of trunk movement in both sitting and standing.  PAtient with improved awarenss of  trunk activation for stability to allow shoulder motion.  Patient requires both physical and verbal cues for effectiveness - trying to help patient carryover this same philosophy into functional mobility tasks as well                OT Education - 03/06/15 1657    Education provided Yes   Education Details seated shoulder exercises AAROM with cane, reviewed home exercises and provided qualitative feedback regarding isolated shoulder and elbow control   Person(s) Educated Patient   Methods Explanation;Demonstration   Comprehension Verbalized understanding;Returned demonstration          OT Short Term Goals - 01/11/15 1452     OT SHORT TERM GOAL #1   Title Pt will report decreased pain LUE to 3/10 or less at rest   Status Achieved   OT SHORT TERM GOAL #2   Title Pt will demonstrate ability to don/doff shirt at [place] guard-supervision level using hemi dressing techniques and a/e PRN (LUE).   Status Achieved   OT SHORT TERM GOAL #3   Title Pt will be able to use LUE as assist for clothing management during toileting at supervision level   Status Achieved   OT SHORT TERM GOAL #4   Title Pt will be Mod I HEP/home program LUE   Status Achieved           OT Long Term Goals - 02/15/15 1607    OT LONG TERM GOAL #5   Title New 01/31/15 Patient will demonstrate ability to utilize left upper extremity to aide with bialteral task - washing hair without assitance while seated in shower   Status On-going               Plan - 03/06/15 1659    Clinical Impression Statement Patient is showing continued steady improvement   Pt will benefit from skilled therapeutic intervention in order to improve on the following deficits (Retired) Decreased activity tolerance;Decreased balance;Decreased coordination;Decreased cognition;Decreased strength;Impaired UE functional use;Impaired tone;Pain;Decreased range of motion;Decreased endurance   Rehab Potential Good   Clinical Impairments Affecting Rehab Potential Pt reports having impaired AROM/bilateral shoulder impairement since "May 2015" requiring assistance w/ ADL's and homemaking activities, h/o balance issues, pain, also dysmetria, choreatic type movement which impeded controlled functional use of UE's.     OT Frequency 2x / week   OT Duration 4 weeks   OT Treatment/Interventions Self-care/ADL training;Moist Heat;Therapeutic exercise;DME and/or AE instruction;Manual Therapy;Passive range of motion;Therapeutic exercises;Patient/family education;Therapeutic activities;Neuromuscular education;Balance training;Functional Mobility Training   OT Home Exercise Plan ongoing    Consulted and Agree with Plan of Care Patient        Problem List Patient Active Problem List   Diagnosis Date Noted  . Left rotator cuff tear 02/01/2015  . Closed left humeral fracture 12/07/2014  . Fatigue 12/07/2014  . 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 Dystonia 08/10/2014  . Syncope and collapse 06/08/2014  . Orthostatic hypotension 06/08/2014  . 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 Fall down stairs 12/11/2013  . Basilar skull fracture 12/11/2013  . Traumatic intracranial hemorrhage 12/11/2013  . Back pain 12/11/2013  . Subarachnoid hemorrhage following injury 12/09/2013  . Syncope 03/27/2013  . Clavicular fracture 03/27/2013  . COUGH 12/28/2007    Mariah Milling, OTR/L 03/06/2015, 5:03 PM  [facility] [address]  [place], [place], [address] Phone: [phone]   Fax:  [phone]

## 2015-03-11 ENCOUNTER — Encounter: Payer: Self-pay | Admitting: Occupational Therapy

## 2015-03-11 ENCOUNTER — Ambulatory Visit: Payer: Medicare Other | Admitting: Occupational Therapy

## 2015-03-11 DIAGNOSIS — M79602 Pain in left arm: Secondary | ICD-10-CM | POA: Diagnosis not present

## 2015-03-11 DIAGNOSIS — R29898 Other symptoms and signs involving the musculoskeletal system: Secondary | ICD-10-CM | POA: Diagnosis not present

## 2015-03-11 DIAGNOSIS — R278 Other lack of coordination: Secondary | ICD-10-CM

## 2015-03-11 DIAGNOSIS — R279 Unspecified lack of coordination: Secondary | ICD-10-CM | POA: Diagnosis not present

## 2015-03-11 DIAGNOSIS — R270 Ataxia, unspecified: Secondary | ICD-10-CM

## 2015-03-11 NOTE — Therapy (Signed)
[facility] [address] [place], [place], [address] Phone: [phone]   Fax:  [phone]  Occupational Therapy Treatment  Patient Details  Name: Claudia Perez MRN: [id number] Date of Birth: 1951-09-01 Referring Provider:  Leighton Ruff, MD  Encounter Date: 03/11/2015      OT End of Session - 03/11/15 1637    Visit Number 20   Number of Visits 25   Date for OT Re-Evaluation 03/28/15   Authorization Type Medicare part A & B   Authorization Time Period Will need Gcode at visit 20   OT Start Time 1448   OT Stop Time 1530   OT Time Calculation (min) 42 min   Activity Tolerance Patient tolerated treatment well   Behavior During Therapy [facility] for tasks assessed/performed      Past Medical History  Diagnosis Date  . Mobility impaired   . Hypercholesteremia   . Vitamin D deficiency   . Shortness of breath     "even now, just talking" (03/27/2013)  . Hyperthyroidism     "a little bit; don't take RX for it" (03/27/2013)  . GERD (gastroesophageal reflux disease)   . Seizures     "think I might have; been blacking out several times in the past" (03/27/2013)  . Fall at home 03/27/2013    "lost consciousness; fractured right clavicle" (03/27/2013)  . Other closed skull fracture without mention of intracranial injury, unspecified state of consciousness   . Subdural hemorrhage   . Abnormality of gait     Past Surgical History  Procedure Laterality Date  . Appendectomy  1960's  . Eye surgery  02/2013    cataract extraction w/ intraocular lens implant  . Brain surgery  10/2003    [facility] evac    There were no vitals filed for this visit.  Visit Diagnosis:  Weakness of left arm  Decreased coordination  Ataxia      Subjective Assessment - 03/11/15 1637    Currently in Pain? 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  She reports not truly using typing skills now - but more "hunt and peck" style - but encouraged to continue to use left hand to aide with this task to increase strength   ADL Comments Patient reports lacking overall endurance to return to church services.  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   Shoulder Exercises: Supine   Flexion AROM;Both;10 reps;AAROM   Flexion Limitations Patient reports pain 5/10 beyond ~115 degrees shoulder flexion   Shoulder Exercises: Standing   Flexion AAROM;Left;10 reps  UE Ranger while simultaneously addressing postural control   ABduction AAROM;Left;10 reps  UE Ranger and postural cotrol for standing weight shifts   Neurological Re-education Exercises   Other Exercises 1 Postural control in standing with feet initially in stance and then stride to address aptient's ability to balance weight through LE's while first stabilizing and then mobilizing left arm in active assisted condition   Other Exercises 2 Place and hold for 10 seconds - and in increasing degrees of pain free shoulder flexion  OT Education - 03-24-15 1637    Education provided Yes   Education Details Balance activity and rest to conserve energy   Person(s) Educated Patient   Methods Explanation   Comprehension Verbalized understanding          OT Short Term Goals - 01/11/15 1452    OT SHORT TERM GOAL #1   Title Pt will report decreased pain LUE to 3/10 or less at rest   Status Achieved   OT SHORT TERM GOAL #2   Title Pt will demonstrate ability to don/doff shirt at [place] guard-supervision level using hemi dressing techniques and a/e PRN (LUE).   Status Achieved   OT SHORT TERM GOAL #3   Title Pt will be able to use LUE as assist for clothing management during toileting at supervision level   Status Achieved   OT SHORT TERM  GOAL #4   Title Pt will be Mod I HEP/home program LUE   Status Achieved           OT Long Term Goals - 02/15/15 1607    OT LONG TERM GOAL #5   Title New 01/31/15 Patient will demonstrate ability to utilize left upper extremity to aide with bialteral task - washing hair without assitance while seated in shower   Status On-going               Plan - 2015-03-24 1638    Clinical Impression Statement Patient still lacks isolated shoulder flexion and abduction control against gravity.  She follows up with MD in the next few weeks, and may be a candidate for further surgery to address nerve injury,   Pt will benefit from skilled therapeutic intervention in order to improve on the following deficits (Retired) Decreased activity tolerance;Decreased balance;Decreased coordination;Decreased cognition;Decreased strength;Impaired UE functional use;Impaired tone;Pain;Decreased range of motion;Decreased endurance   Rehab Potential Good   Clinical Impairments Affecting Rehab Potential Pt reports having impaired AROM/bilateral shoulder impairement since "May 2015" requiring assistance w/ ADL's and homemaking activities, h/o balance issues, pain, also dysmetria, choreatic type movement which impeded controlled functional use of UE's.     OT Frequency 2x / week   OT Duration 4 weeks   OT Treatment/Interventions Self-care/ADL training;Moist Heat;Therapeutic exercise;DME and/or AE instruction;Manual Therapy;Passive range of motion;Therapeutic exercises;Patient/family education;Therapeutic activities;Neuromuscular education;Balance training;Functional Mobility Training   Plan Review goals and complete OT discharge - patient has goal remaining of using left hand to aide with shampooing hair   OT Home Exercise Plan ongoing   Consulted and Agree with Plan of Care Patient          G-Codes - Mar 24, 2015 1640    Functional Assessment Tool Used Box and Blocks left UE   Functional Limitation Carrying, moving and  handling objects   Carrying, Moving and Handling Objects Current Status (W2956) At least 60 percent but less than 80 percent impaired, limited or restricted   Carrying, Moving and Handling Objects Goal Status (O1308) At least 40 percent but less than 60 percent impaired, limited or restricted      Problem List Patient Active Problem List   Diagnosis Date Noted  . Left rotator cuff tear 02/01/2015  . Closed left humeral fracture 12/07/2014  . Fatigue 12/07/2014  . 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 Dystonia 08/10/2014  . Syncope and collapse 06/08/2014  . Orthostatic hypotension 06/08/2014  . 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 Fall down stairs 12/11/2013  . Basilar skull fracture 12/11/2013  . Traumatic intracranial hemorrhage 12/11/2013  . Back pain 12/11/2013  . Subarachnoid hemorrhage following injury 12/09/2013  . Syncope 03/27/2013  . Clavicular fracture 03/27/2013  . COUGH 12/28/2007    Mariah Milling, OTR/L 03/11/2015, 4:41 PM  [facility] [address] [place], [place], [address] Phone: [phone]   Fax:  [phone]

## 2015-03-18 ENCOUNTER — Ambulatory Visit: Payer: Medicare Other | Admitting: Occupational Therapy

## 2015-03-18 DIAGNOSIS — R270 Ataxia, unspecified: Secondary | ICD-10-CM

## 2015-03-18 DIAGNOSIS — R29898 Other symptoms and signs involving the musculoskeletal system: Secondary | ICD-10-CM

## 2015-03-18 DIAGNOSIS — R278 Other lack of coordination: Secondary | ICD-10-CM

## 2015-03-18 DIAGNOSIS — R279 Unspecified lack of coordination: Secondary | ICD-10-CM | POA: Diagnosis not present

## 2015-03-18 DIAGNOSIS — M79602 Pain in left arm: Secondary | ICD-10-CM | POA: Diagnosis not present

## 2015-03-18 NOTE — Therapy (Signed)
[facility] [address] [place], [place], [address] Phone: [phone]   Fax:  [phone]  Occupational Therapy Treatment  Patient Details  Name: Claudia Perez MRN: [id number] Date of Birth: March 21, 1951 Referring Provider:  Leighton Ruff, MD  Encounter Date: 03/18/2015      OT End of Session - 03/18/15 1549    Visit Number 21   Number of Visits 25   Authorization Type Medicare part A & B   OT Start Time 3875   OT Stop Time 1535   OT Time Calculation (min) 48 min   Activity Tolerance Patient tolerated treatment well   Behavior During Therapy [facility] for tasks assessed/performed      Past Medical History  Diagnosis Date  . Mobility impaired   . Hypercholesteremia   . Vitamin D deficiency   . Shortness of breath     "even now, just talking" (03/27/2013)  . Hyperthyroidism     "a little bit; don't take RX for it" (03/27/2013)  . GERD (gastroesophageal reflux disease)   . Seizures     "think I might have; been blacking out several times in the past" (03/27/2013)  . Fall at home 03/27/2013    "lost consciousness; fractured right clavicle" (03/27/2013)  . Other closed skull fracture without mention of intracranial injury, unspecified state of consciousness   . Subdural hemorrhage   . Abnormality of gait     Past Surgical History  Procedure Laterality Date  . Appendectomy  1960's  . Eye surgery  02/2013    cataract extraction w/ intraocular lens implant  . Brain surgery  10/2003    [facility] evac    There were no vitals filed for this visit.  Visit Diagnosis:  Weakness of left arm  Decreased coordination  Ataxia      Subjective Assessment - 03/18/15 1541    Pertinent History see snapshot   Currently in Pain? No/denies   Pain Score 0-No pain            [facility] OT Assessment - 03/18/15 0001    AROM   Overall AROM  Deficits   Overall AROM Comments Significantly limited to absent shoulder abduction,  limited shoulder flexion.  External rotation to about 30 degrees, full internal rotation in humeral dependent.     Strength   Gross Grasp Functional   Left Hand Gross Grasp --  25 lbs L/R                  OT Treatments/Exercises (OP) - 03/18/15 0001    Transfers   Transfers Sit to Stand;Stand to Sit   Sit to Stand 5: Supervision   Sit to Stand Details (indicate cue type and reason) Practiced from office chair with wheels to simulate typing at her desk at home   Stand to Sit 5: Supervision   Stand to Sit Details cueing to control descent to avoid falling from rolling office chair   ADLs   UB Dressing modified independence with increased time (excluding donning trench coat)   LB Dressing modified independent   Toileting modified independent   Bathing modified independent once in shower (excluding shampooing hair)   Cooking eginning to particiapte in meal prep with husband   Writing Beginning to type again using "hunt and peck" with bilateral UE's   ADL Comments Significant improvement with independence.     Shoulder Exercises: Seated   Other Seated Exercises Seated, table top slides, cane exercises (AAROM) for shoulder flexion/ext, abd/add, IR/ER  Neurological Re-education Exercises   Other Exercises 1 Postural cotrol encouraged with all exercises.  Worked with patient to reinforce the importance of isolated shoulder activity, active core musculature during exercise and even with functional mobility tasks                OT Education - 03-20-15 1548    Education provided Yes   Education Details Reviewed entire HEP   Person(s) Educated Patient   Methods Explanation;Demonstration   Comprehension Verbalized understanding;Returned demonstration          OT Short Term Goals - 03/20/15 1456    OT SHORT TERM GOAL #1   Title Pt will report decreased pain LUE to 3/10 or less at rest   Status Achieved   OT SHORT TERM GOAL #2   Title Pt will demonstrate ability to  don/doff shirt at [place] guard-supervision level using hemi dressing techniques and a/e PRN (LUE).   Status Achieved   OT SHORT TERM GOAL #3   Title Pt will be able to use LUE as assist for clothing management during toileting at supervision level   Status Achieved   OT SHORT TERM GOAL #4   Title Pt will be Mod I HEP/home program LUE   Status Achieved           OT Long Term Goals - 2015/03/20 1457    OT LONG TERM GOAL #1   Title Pt will be Mod I UB dressing using hemi dressing techniques while seated (LUE)   Status Achieved   OT LONG TERM GOAL #2   Title Pt will be Mod I clothing management related to toileting (don/doff pants) using LUE as assist    Status Achieved   OT LONG TERM GOAL #3   Title Pt will be supervision level for UB bathing techniques seated on tub bench/chair using LUE as assist   Status Achieved   OT LONG TERM GOAL #4   Title Pt will demonstrate increased coordination as seen by improving Box and Blocks score by 10 or more blocks LUE.    Status Achieved   OT LONG TERM GOAL #5   Title New 01/31/15 Patient will demonstrate ability to utilize left upper extremity to aide with bialteral task - washing hair without assitance while seated in shower   Status Partially Met               Plan - 03-20-15 1549    Clinical Impression Statement Patient has met most of her goals at this point, and is limited by persistent lack of shoulder abduction, isolated shoulder flexion against gravity.  Patient to follow up with reha MD to address future medical options for left UE.     Pt will benefit from skilled therapeutic intervention in order to improve on the following deficits (Retired) Decreased activity tolerance;Decreased balance;Decreased coordination;Decreased cognition;Decreased strength;Impaired UE functional use;Impaired tone;Pain;Decreased range of motion;Decreased endurance   Rehab Potential Good   OT Duration 4 weeks   OT Treatment/Interventions Self-care/ADL  training;Moist Heat;Therapeutic exercise;DME and/or AE instruction;Manual Therapy;Passive range of motion;Therapeutic exercises;Patient/family education;Therapeutic activities;Neuromuscular education;Balance training;Functional Mobility Training   Consulted and Agree with Plan of Care Patient          G-Codes - 03/20/2015 1551    Functional Assessment Tool Used Box and Blocks left UE   Functional Limitation Carrying, moving and handling objects   Carrying, Moving and Handling Objects Current Status (I5027) At least 60 percent but less than 80 percent impaired, limited or restricted   Carrying,  Moving and Handling Objects Goal Status [phone]) At least 40 percent but less than 60 percent impaired, limited or restricted   Carrying, Moving and Handling Objects Discharge Status [phone]) At least 40 percent but less than 60 percent impaired, limited or restricted      Problem List Patient Active Problem List   Diagnosis Date Noted  . Left rotator cuff tear 02/01/2015  . Closed left humeral fracture 12/07/2014  . Fatigue 12/07/2014  . 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 Dystonia 08/10/2014  . Syncope and collapse 06/08/2014  . Orthostatic hypotension 06/08/2014  . 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 Fall down stairs 12/11/2013  . Basilar skull fracture 12/11/2013  . Traumatic intracranial hemorrhage 12/11/2013  . Back pain 12/11/2013  . Subarachnoid hemorrhage following injury 12/09/2013  . Syncope 03/27/2013  . Clavicular fracture 03/27/2013  . COUGH 12/28/2007    OCCUPATIONAL THERAPY DISCHARGE SUMMARY  Visits from Start of Care: 21  Current functional level related to goals / functional outcomes: Patient currently mod I to close supervision / set up with most basic self care tasks at this time, due to decreased pain,  improved range of motion, and improved strength in left UE.  Patient required minimal assistance for all ADL prior to this episode of care.   Remaining deficits: Ataxia, decreased active shoulder abduction / flexion left UE   Education / Equipment: Home exercise and activity program in place and patient very consistently performing.  Plan: Patient agrees to discharge.  Patient goals were met. Patient is being discharged due to meeting the stated rehab goals.  ?????      Mariah Milling, OTR/L 03/18/2015, 3:52 PM  [facility] [address] [place], [place], [address] Phone: [phone]   Fax:  [phone]

## 2015-04-03 ENCOUNTER — Encounter: Payer: Medicare Other | Attending: Physical Medicine & Rehabilitation | Admitting: Physical Medicine & Rehabilitation

## 2015-04-03 ENCOUNTER — Encounter: Payer: Self-pay | Admitting: Physical Medicine & Rehabilitation

## 2015-04-03 VITALS — BP 98/69 | HR 74 | Resp 16

## 2015-04-03 DIAGNOSIS — R2689 Other abnormalities of gait and mobility: Secondary | ICD-10-CM | POA: Diagnosis not present

## 2015-04-03 DIAGNOSIS — S069X0S Unspecified intracranial injury without loss of consciousness, sequela: Secondary | ICD-10-CM | POA: Diagnosis not present

## 2015-04-03 DIAGNOSIS — S066X4S Traumatic subarachnoid hemorrhage with loss of consciousness of 6 hours to 24 hours, sequela: Secondary | ICD-10-CM

## 2015-04-03 DIAGNOSIS — K219 Gastro-esophageal reflux disease without esophagitis: Secondary | ICD-10-CM | POA: Insufficient documentation

## 2015-04-03 DIAGNOSIS — R5383 Other fatigue: Secondary | ICD-10-CM | POA: Insufficient documentation

## 2015-04-03 DIAGNOSIS — R569 Unspecified convulsions: Secondary | ICD-10-CM | POA: Diagnosis not present

## 2015-04-03 DIAGNOSIS — F329 Major depressive disorder, single episode, unspecified: Secondary | ICD-10-CM | POA: Diagnosis not present

## 2015-04-03 DIAGNOSIS — E780 Pure hypercholesterolemia: Secondary | ICD-10-CM | POA: Insufficient documentation

## 2015-04-03 DIAGNOSIS — S42302S Unspecified fracture of shaft of humerus, left arm, sequela: Secondary | ICD-10-CM | POA: Diagnosis not present

## 2015-04-03 DIAGNOSIS — S0291XS Unspecified fracture of skull, sequela: Secondary | ICD-10-CM | POA: Insufficient documentation

## 2015-04-03 DIAGNOSIS — E559 Vitamin D deficiency, unspecified: Secondary | ICD-10-CM | POA: Diagnosis not present

## 2015-04-03 DIAGNOSIS — R55 Syncope and collapse: Secondary | ICD-10-CM | POA: Diagnosis not present

## 2015-04-03 DIAGNOSIS — S065X0S Traumatic subdural hemorrhage without loss of consciousness, sequela: Secondary | ICD-10-CM | POA: Diagnosis not present

## 2015-04-03 DIAGNOSIS — Z7409 Other reduced mobility: Secondary | ICD-10-CM | POA: Insufficient documentation

## 2015-04-03 NOTE — Progress Notes (Signed)
Subjective:    Patient ID: Claudia Perez, female    DOB: [birth year], 64 y.o.   MRN: [id number]  HPI   Mrs. Macinnes is back regarding her TBI and associated issues. She went for her MRI of the left shoulder which revealed:  IMPRESSION: 1. Ununited humeral neck fracture with mild medial angulation of the radial head likely due to the pull of the rotator cuff tendons. 2. No full thickness retracted rotator cuff tear. 3. Intact biceps tendon. 4. No findings for bony impingement. 5. Mild diffuse edema like signal abnormality in the posterior muscles may suggest a nerve injury  She went for OT who has been working on strength and ROM, and fucntional use of the limb. There has been some improvement but she still struggles with weakness at the shoulder as well occasional pain when she tries to lift the limb.  She has not been taking ritalin--says out for two weeks and doesn't really find it makes a difference.   Pain Inventory Average Pain 1 Pain Right Now 1 My pain is dull  In the last 24 hours, has pain interfered with the following? General activity 1 Relation with others 0 Enjoyment of life 0 What TIME of day is your pain at its worst? evening Sleep (in general) Good  Pain is worse with: walking, bending and standing Pain improves with: rest and therapy/exercise Relief from Meds: no answer  Mobility use a walker ability to climb steps?  no do you drive?  no  Function disabled: date disabled . I need assistance with the following:  bathing, meal prep, household duties and shopping  Neuro/Psych weakness trouble walking spasms loss of taste or smell  Prior Studies Any changes since last visit?  no  Physicians involved in your care Any changes since last visit?  no   Family History  Problem Relation Age of Onset  . Hypertension Mother   . Cirrhosis Brother    History   Social History  . Marital Status: Married    Spouse Name: N/A  . Number of Children: N/A    . Years of Education: N/A   Social History Main Topics  . Smoking status: Never Smoker   . Smokeless tobacco: Never Used  . Alcohol Use: Yes     Comment: 03/27/2013 glass of wine "couple times/yr"  . Drug Use: No  . Sexual Activity: Not Currently   Other Topics Concern  . None   Social History Narrative   Past Surgical History  Procedure Laterality Date  . Appendectomy  1960's  . Eye surgery  02/2013    cataract extraction w/ intraocular lens implant  . Brain surgery  10/2003    SDH evac   Past Medical History  Diagnosis Date  . Mobility impaired   . Hypercholesteremia   . Vitamin D deficiency   . Shortness of breath     "even now, just talking" (03/27/2013)  . Hyperthyroidism     "a little bit; don't take RX for it" (03/27/2013)  . GERD (gastroesophageal reflux disease)   . Seizures     "think I might have; been blacking out several times in the past" (03/27/2013)  . Fall at home 03/27/2013    "lost consciousness; fractured right clavicle" (03/27/2013)  . Other closed skull fracture without mention of intracranial injury, unspecified state of consciousness   . Subdural hemorrhage   . Abnormality of gait    BP 98/69 mmHg  Pulse 74  Resp 16  SpO2 99%  Opioid  Risk Score:   Fall Risk Score: Moderate Fall Risk (6-13 points) (previously educated and given handout)`1  Depression screen PHQ 2/9  No flowsheet data found.  Review of Systems  Constitutional:       Loss of taste or smell  Musculoskeletal: Positive for gait problem.       Spasms  Neurological: Positive for weakness.  All other systems reviewed and are negative.      Objective:   Physical Exam  Constitutional: She is oriented to person, place, and time. She appears well-developed and well-nourished. No distress  HENT:  Head: Normocephalic. No tenderness over frontal sinuses  Eyes: Conjunctivae are normal. Pupils are equal, round, and reactive to light.  Neck: Normal range of motion.   Cardiovascular: Normal rate and regular rhythm.  Respiratory: Effort normal and breath sounds normal. No respiratory distress. She has no wheezes.  GI: Soft. Bowel sounds are normal. She exhibits no distension. There is no tenderness.  Musculoskeletal: She exhibits no edema. Left shoulder easily rangeable in all directions. Lacks AROM with abduction and flexion in particular. She does have a callous over the upper third of humerus. May have a little atrophy of the deltoid. dont' appreciate any RTC atrophy however. Has more pain with abduction at 90 and above.  Neurological: She is alert and oriented to person, place, and time.  Good attention and focus. Gait is wide-based, fairly stable. Still weak in trunk. Takes her time.  Psychiatric: She is pleasant as always.    Assessment/Plan:  1. 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 Multifactorial gait disorder/syncope--overall she continues to improve  -teds/fluids for bp management  3. 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 Some atrophy in teres minor and infraspinatus on imaging--don't see it clinically. ---may have had some peripheral nerve injury due to fall but I believe more than anything else there is some dissuse atrophy limitation due to fracture -ROM somewhat improved but still lacks AROM especially with ABD, shoulder flexion. Minimal pain.  -recommend ortho follow up for further recs---she will call with the name of orthopod she saw. -tramadol prn for more severe pain if needed.  4. Continue celexa for depression  5. Fatigue/attention deficits:  -ritalin not refilled -multifactorial causes associated with chronic effects of her BI   -thyroid testing essentially normal--would not change regimen  -  B complex, Coenzyme Q10, vitamin D.   25 minutes of face to face patient care time were spent during this visit. All questions were encouraged and answered. i'll see her back in a month

## 2015-04-03 NOTE — Patient Instructions (Signed)
PLEASE LET ME KNOW WHO THE SURGEON WAS WHO SAW YOU AND I WILL CONTACT HIM ABOUT FOLLOW UP.   PLEASE CALL ME WITH ANY PROBLEMS OR QUESTIONS (#[phone]).

## 2015-05-10 DIAGNOSIS — E780 Pure hypercholesterolemia: Secondary | ICD-10-CM | POA: Diagnosis not present

## 2015-05-10 DIAGNOSIS — E559 Vitamin D deficiency, unspecified: Secondary | ICD-10-CM | POA: Diagnosis not present

## 2015-05-10 DIAGNOSIS — K219 Gastro-esophageal reflux disease without esophagitis: Secondary | ICD-10-CM | POA: Diagnosis not present

## 2015-05-24 ENCOUNTER — Emergency Department (HOSPITAL_COMMUNITY): Payer: Medicare Other

## 2015-05-24 ENCOUNTER — Observation Stay (HOSPITAL_COMMUNITY)
Admission: EM | Admit: 2015-05-24 | Discharge: 2015-05-25 | Disposition: A | Discharge status: Home / Self Care | Payer: Medicare Other | Attending: Family Medicine | Admitting: Family Medicine

## 2015-05-24 ENCOUNTER — Encounter (HOSPITAL_COMMUNITY): Payer: Self-pay | Admitting: *Deleted

## 2015-05-24 DIAGNOSIS — R269 Unspecified abnormalities of gait and mobility: Secondary | ICD-10-CM | POA: Insufficient documentation

## 2015-05-24 DIAGNOSIS — Z87820 Personal history of traumatic brain injury: Secondary | ICD-10-CM | POA: Insufficient documentation

## 2015-05-24 DIAGNOSIS — K219 Gastro-esophageal reflux disease without esophagitis: Secondary | ICD-10-CM | POA: Insufficient documentation

## 2015-05-24 DIAGNOSIS — E559 Vitamin D deficiency, unspecified: Secondary | ICD-10-CM | POA: Insufficient documentation

## 2015-05-24 DIAGNOSIS — Z888 Allergy status to other drugs, medicaments and biological substances status: Secondary | ICD-10-CM | POA: Diagnosis not present

## 2015-05-24 DIAGNOSIS — E780 Pure hypercholesterolemia: Secondary | ICD-10-CM | POA: Insufficient documentation

## 2015-05-24 DIAGNOSIS — Z7409 Other reduced mobility: Secondary | ICD-10-CM | POA: Insufficient documentation

## 2015-05-24 DIAGNOSIS — Z882 Allergy status to sulfonamides status: Secondary | ICD-10-CM | POA: Diagnosis not present

## 2015-05-24 DIAGNOSIS — R55 Syncope and collapse: Principal | ICD-10-CM | POA: Diagnosis present

## 2015-05-24 DIAGNOSIS — M47812 Spondylosis without myelopathy or radiculopathy, cervical region: Secondary | ICD-10-CM | POA: Diagnosis not present

## 2015-05-24 DIAGNOSIS — R401 Stupor: Secondary | ICD-10-CM | POA: Diagnosis not present

## 2015-05-24 LAB — CBC
HCT: 43.1 % (ref 36.0–46.0)
Hemoglobin: 14.5 g/dL (ref 12.0–15.0)
MCH: 31.9 pg (ref 26.0–34.0)
MCHC: 33.6 g/dL (ref 30.0–36.0)
MCV: 94.9 fL (ref 78.0–100.0)
PLATELETS: 251 10*3/uL (ref 150–400)
RBC: 4.54 MIL/uL (ref 3.87–5.11)
RDW: 12.7 % (ref 11.5–15.5)
WBC: 10.8 10*3/uL — ABNORMAL HIGH (ref 4.0–10.5)

## 2015-05-24 LAB — BASIC METABOLIC PANEL
Anion gap: 8 (ref 5–15)
BUN: 9 mg/dL (ref 6–20)
CO2: 27 mmol/L (ref 22–32)
Calcium: 9 mg/dL (ref 8.9–10.3)
Chloride: 100 mmol/L — ABNORMAL LOW (ref 101–111)
Creatinine, Ser: 0.68 mg/dL (ref 0.44–1.00)
GFR calc Af Amer: >60 mL/min (ref >60)
GFR calc non Af Amer: >60 mL/min (ref >60)
GLUCOSE: 147 mg/dL — AB (ref 65–99)
POTASSIUM: 4.4 mmol/L (ref 3.5–5.1)
Sodium: 135 mmol/L (ref 135–145)

## 2015-05-24 LAB — I-STAT TROPONIN, ED: TROPONIN I, POC: 0 ng/mL (ref 0.00–0.08)

## 2015-05-24 MED ORDER — TRAMADOL HCL 50 MG PO TABS: 50.0000 mg | ORAL_TABLET | Freq: Two times a day (BID) | ORAL | Status: DC | PRN

## 2015-05-24 MED ORDER — CITALOPRAM HYDROBROMIDE 10 MG PO TABS
10.0000 mg | ORAL_TABLET | Freq: Every day | ORAL | Status: DC
  Administered 2015-05-25: 10 mg via ORAL
  Filled 2015-05-24: qty 1

## 2015-05-24 MED ORDER — ENOXAPARIN SODIUM 40 MG/0.4ML ~~LOC~~ SOLN
40.0000 mg | SUBCUTANEOUS | Status: DC
  Administered 2015-05-24: 40 mg via SUBCUTANEOUS
  Filled 2015-05-24 (×2): qty 0.4

## 2015-05-24 MED ORDER — SENNOSIDES-DOCUSATE SODIUM 8.6-50 MG PO TABS: 1.0000 | ORAL_TABLET | Freq: Every evening | ORAL | Status: DC | PRN

## 2015-05-24 MED ORDER — ONDANSETRON HCL 4 MG PO TABS: 4.0000 mg | ORAL_TABLET | Freq: Four times a day (QID) | ORAL | Status: DC | PRN

## 2015-05-24 MED ORDER — SODIUM CHLORIDE 0.9 % IJ SOLN
3.0000 mL | Freq: Two times a day (BID) | INTRAMUSCULAR | Status: DC
  Administered 2015-05-24 – 2015-05-25 (×2): 3 mL via INTRAVENOUS

## 2015-05-24 MED ORDER — ONDANSETRON HCL 4 MG/2ML IJ SOLN: 4.0000 mg | Freq: Four times a day (QID) | INTRAMUSCULAR | Status: DC | PRN

## 2015-05-24 NOTE — H&P (Signed)
History and Physical  Claudia Perez ZOX:[id number] DOB: [birth year] DOA: 05/24/2015  Referring physician: Lorre Munroe, PA-C, ED provider PCP: Gerrit Heck, MD   Chief Complaint: Syncopal episode  HPI: Claudia Perez is a 64 y.o. 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 The patient had a fall at home earlier today due to a syncopal episode. The patient was standing after eating, when she felt lightheaded and passed out. She will coupled the floor with her sister there. She is uncertain as to how long she was unconscious. She is unaware of whether she struck her head or neck. She has had a prior episode of syncope back in 2014. She does have vertigo at baseline and walks with a walker. Her symptoms were not worse than usual prior to her syncope.   Review of Systems:   Pt complains of vertigo, lightheadedness.  Pt denies any fevers, chills, nausea, vomiting, abdominal pain, chest pain, shortness of breath, palpitations, headache, vision changes, neck pain.  Review of systems are otherwise negative  Past Medical History  Diagnosis Date  . Mobility impaired   . Hypercholesteremia   . Vitamin D deficiency   . Shortness of breath     "even now, just talking" (03/27/2013)  . Hyperthyroidism     "a little bit; don't take RX for it" (03/27/2013)  . GERD (gastroesophageal reflux disease)   . Seizures     "think I might have; been blacking out several times in the past" (03/27/2013)  . Fall at home 03/27/2013    "lost consciousness; fractured right clavicle" (03/27/2013)  . Other closed skull fracture without mention of intracranial injury, unspecified state of consciousness   . Subdural hemorrhage   . Abnormality of gait    Past Surgical History  Procedure Laterality Date  . Appendectomy  1960's  . Eye surgery  02/2013    cataract extraction w/ intraocular  lens implant  . Brain surgery  10/2003    [facility] evac   Social History:  reports that she has never smoked. She has never used smokeless tobacco. She reports that she drinks alcohol. She reports that she does not use illicit drugs. Patient lives at home & is able to participate in activities of daily living  Allergies  Allergen Reactions  . Nitrofurantoin Itching  . Phenazopyridine Other (See Comments)  . Sulfamethoxazole-Trimethoprim Itching    Bactrim  . Sulfamethoxazole Rash    Family History  Problem Relation Age of Onset  . Hypertension Mother   . Cirrhosis Brother      Prior to Admission medications   Medication Sig Start Date End Date Taking? Authorizing Provider  acetaminophen (TYLENOL) 500 MG tablet Take 1,000 mg by mouth every 6 (six) hours as needed for moderate pain.   Yes Historical Provider, MD  citalopram (CELEXA) 10 MG tablet Take 10 mg by mouth daily.   Yes Historical Provider, MD  traMADol (ULTRAM) 50 MG tablet Take 1 tablet (50 mg total) by mouth every 12 (twelve) hours as needed for moderate pain. 12/07/14  Yes Meredith Staggers, MD  Vitamins/Minerals TABS Take 1 tablet by mouth daily.    Yes Historical Provider, MD  naproxen (NAPROSYN) 500 MG tablet Take 1 tablet (500 mg total) by mouth 2 (two) times daily with a meal. Patient not taking: Reported on 05/24/2015 10/19/14   Noemi Chapel, MD    Physical Exam: BP 118/60 mmHg  Pulse 62  Temp(Src) 98.2 F (36.8 C) (Oral)  Resp 16  Ht '5\' 1"'$  (1.549 m)  Wt 48.626 kg (107 lb 3.2 oz)  BMI 20.27 kg/m2  SpO2 98%  General: Older black female. Awake and alert and oriented x3. No acute cardiopulmonary distress.  Eyes: Pupils equal, round, reactive to light. Extraocular muscles are intact. Sclerae anicteric and noninjected.  ENT:  Moist mucosal membranes. No mucosal lesions. Teeth in good repair  Neck: Neck supple without lymphadenopathy. No carotid bruits. No masses palpated.  Cardiovascular: Regular rate with normal  S1-S2 sounds. No murmurs, rubs, gallops auscultated. No JVD.  Respiratory: Good respiratory effort with no wheezes, rales, rhonchi. Lungs clear to auscultation bilaterally.  Abdomen: Soft, nontender, nondistended. Active bowel sounds. No masses or hepatosplenomegaly  Skin: Dry, warm to touch. 2+ dorsalis pedis and radial pulses. Musculoskeletal: No calf or leg pain. All major joints not erythematous nontender.  Psychiatric: Intact judgment and insight.  Neurologic: No focal neurological deficits. Cranial nerves II through XII are grossly intact.           Labs on Admission:  Basic Metabolic Panel:  Recent Labs Lab 05/24/15 1545  NA 135  K 4.4  CL 100*  CO2 27  GLUCOSE 147*  BUN 9  CREATININE 0.68  CALCIUM 9.0   Liver Function Tests: No results for input(s): AST, ALT, ALKPHOS, BILITOT, PROT, ALBUMIN in the last 168 hours. No results for input(s): LIPASE, AMYLASE in the last 168 hours. No results for input(s): AMMONIA in the last 168 hours. CBC:  Recent Labs Lab 05/24/15 1545  WBC 10.8*  HGB 14.5  HCT 43.1  MCV 94.9  PLT 251   Cardiac Enzymes: No results for input(s): CKTOTAL, CKMB, CKMBINDEX, TROPONINI in the last 168 hours.  BNP (last 3 results) No results for input(s): BNP in the last 8760 hours.  ProBNP (last 3 results) No results for input(s): PROBNP in the last 8760 hours.  CBG: No results for input(s): GLUCAP in the last 168 hours.  Radiological Exams on Admission: Ct Head Wo Contrast  05/24/2015   CLINICAL DATA:  64 year old female with loss of consciousness.  EXAM: CT HEAD WITHOUT CONTRAST  CT CERVICAL SPINE WITHOUT CONTRAST  TECHNIQUE: Multidetector CT imaging of the head and cervical spine was performed following the standard protocol without intravenous contrast. Multiplanar CT image reconstructions of the cervical spine were also generated.  COMPARISON:  CT dated 05/07/2014  FINDINGS: CT HEAD FINDINGS  The ventricles are dilated and the sulci are  prominent compatible with age-related atrophy. Periventricular and deep white matter hypodensities represent chronic microvascular ischemic changes. Left posterior parietal and right inferior frontal lobe old infarcts and encephalomalacia noted. There is no intracranial hemorrhage. No mass effect or midline shift identified.  The visualized paranasal sinuses and mastoid air cells are well aerated. Left parietal craniotomy.  CT CERVICAL SPINE FINDINGS  There is no acute fracture or subluxation of the cervical spine.The intervertebral disc spaces are preserved.Multilevel degenerative changes with mild decreased vertebral body height, chronic. The odontoid and spinous processes are intact.There is normal anatomic alignment of the C1-C2 lateral masses. The visualized soft tissues appear unremarkable.  IMPRESSION: No acute intracranial pathology.  Age-related atrophy and chronic microvascular ischemic disease. 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  If symptoms persist and there are no contraindications, MRI may provide better evaluation if clinically indicated.  No acute cervical spine fracture.   Electronically Signed   By: Anner Crete M.D.   On: 05/24/2015 18:16  Ct Cervical Spine Wo Contrast  05/24/2015   CLINICAL DATA:  64 year old female with loss of consciousness.  EXAM: CT HEAD WITHOUT CONTRAST  CT CERVICAL SPINE WITHOUT CONTRAST  TECHNIQUE: Multidetector CT imaging of the head and cervical spine was performed following the standard protocol without intravenous contrast. Multiplanar CT image reconstructions of the cervical spine were also generated.  COMPARISON:  CT dated 05/07/2014  FINDINGS: CT HEAD FINDINGS  The ventricles are dilated and the sulci are prominent compatible with age-related atrophy. Periventricular and deep white matter hypodensities represent chronic microvascular ischemic changes. Left posterior parietal and right inferior frontal lobe old infarcts and encephalomalacia noted.  There is no intracranial hemorrhage. No mass effect or midline shift identified.  The visualized paranasal sinuses and mastoid air cells are well aerated. Left parietal craniotomy.  CT CERVICAL SPINE FINDINGS  There is no acute fracture or subluxation of the cervical spine.The intervertebral disc spaces are preserved.Multilevel degenerative changes with mild decreased vertebral body height, chronic. The odontoid and spinous processes are intact.There is normal anatomic alignment of the C1-C2 lateral masses. The visualized soft tissues appear unremarkable.  IMPRESSION: No acute intracranial pathology.  Age-related atrophy and chronic microvascular ischemic disease. 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  If symptoms persist and there are no contraindications, MRI may provide better evaluation if clinically indicated.  No acute cervical spine fracture.   Electronically Signed   By: Anner Crete M.D.   On: 05/24/2015 18:16    EKG: Independently reviewed. Normal sinus rhythm with a rate of 74. Normal intervals. No ST changes.  Assessment/Plan Present on Admission:  . Syncope  This patient was discussed with the ED physician, including pertinent vitals, physical exam findings, labs, and imaging.  We also discussed care given by the ED provider.  #1 syncope #2 history of subarachnoid hemorrhage secondary to basilar skull fracture #3 GERD  Admit for observation on telemetry.  Echocardiogram in the morning.  Repeat metabolic panel in the morning.  If everything appears with the echocardiogram, should be able to discharge her to home.  DVT prophylaxis: Lovenox  Consultants: None  Code Status: Full code  Family Communication: None   Disposition Plan: Home following observation   Truett Mainland, DO [facility] Pager [phone]

## 2015-05-24 NOTE — ED Notes (Signed)
Dr. Nehemiah Settle- hospitalist at bedside.

## 2015-05-24 NOTE — ED Notes (Signed)
Pt states she lost conscious today while eating breakfast. Pt found herself on the floor, does not remember the event and it was unwitnessed. Pt denies pain, unknown if she hit her head. Pt's husband reports history of similar events

## 2015-05-24 NOTE — ED Provider Notes (Signed)
CSN: [id number]     Arrival date & time 05/24/15  1423 History   First MD Initiated Contact with Patient 05/24/15 1608     Chief Complaint  Patient presents with  . Fall  . Loss of Consciousness     (Consider location/radiation/quality/duration/timing/severity/associated sxs/prior Treatment) HPI Comments: Patient with past medical history of impaired mobility secondary to remote subdural hematoma/closed skull fracture, presents to the emergency department with chief complaint of syncopal episode. Patient states that she was eating lunch today, and after she finished she passed out. She denies feeling dizzy, or experiencing any chest pain or shortness of breath prior to passing out. She states that she went to stand up, and the next thing she remembers is waking up in her chair. She states that ever sent she had her subcutaneous dural hematoma evacuated in 2004, she has had limited mobility, and has had to walk with a walker. She denies any changes since the syncopal episode today. Denies any new weakness or numbness. She states that she had a fall on her left shoulder in November of last year, and still has some pain from this, but no changes today. She denies dizziness now. Denies any chest pain or shortness breath.   The history is provided by the patient. No language interpreter was used.    Past Medical History  Diagnosis Date  . Mobility impaired   . Hypercholesteremia   . Vitamin D deficiency   . Shortness of breath     "even now, just talking" (03/27/2013)  . Hyperthyroidism     "a little bit; don't take RX for it" (03/27/2013)  . GERD (gastroesophageal reflux disease)   . Seizures     "think I might have; been blacking out several times in the past" (03/27/2013)  . Fall at home 03/27/2013    "lost consciousness; fractured right clavicle" (03/27/2013)  . Other closed skull fracture without mention of intracranial injury, unspecified state of consciousness   . Subdural hemorrhage   .  Abnormality of gait    Past Surgical History  Procedure Laterality Date  . Appendectomy  1960's  . Eye surgery  02/2013    cataract extraction w/ intraocular lens implant  . Brain surgery  10/2003    [facility] evac   Family History  Problem Relation Age of Onset  . Hypertension Mother   . Cirrhosis Brother    History  Substance Use Topics  . Smoking status: Never Smoker   . Smokeless tobacco: Never Used  . Alcohol Use: Yes     Comment: 03/27/2013 glass of wine "couple times/yr"   OB History    No data available     Review of Systems  Constitutional: Negative for fever and chills.  Respiratory: Negative for shortness of breath.   Cardiovascular: Negative for chest pain.  Gastrointestinal: Negative for nausea, vomiting, diarrhea and constipation.  Genitourinary: Negative for dysuria.  Neurological: Positive for syncope.  All other systems reviewed and are negative.     Allergies  Nitrofurantoin; Phenazopyridine; Sulfamethoxazole-trimethoprim; and Sulfamethoxazole  Home Medications   Prior to Admission medications   Medication Sig Start Date End Date Taking? Authorizing Provider  acetaminophen (TYLENOL) 500 MG tablet Take 1,000 mg by mouth every 6 (six) hours as needed for moderate pain.    Historical Provider, MD  citalopram (CELEXA) 10 MG tablet Take 10 mg by mouth daily.    Historical Provider, MD  clobetasol (TEMOVATE) 0.05 % external solution Apply 1 application topically 2 (two) times daily as  needed. 02/23/15   Historical Provider, MD  DEXILANT 60 MG capsule Take 1 capsule by mouth daily. 11/29/14   Historical Provider, MD  ergocalciferol (VITAMIN D2) 50000 UNITS capsule Take 50,000 Units by mouth once a week. On wednesdays    Historical Provider, MD  naproxen (NAPROSYN) 500 MG tablet Take 1 tablet (500 mg total) by mouth 2 (two) times daily with a meal. 10/19/14   Noemi Chapel, MD  traMADol (ULTRAM) 50 MG tablet Take 1 tablet (50 mg total) by mouth every 12 (twelve)  hours as needed for moderate pain. 12/07/14   Meredith Staggers, MD  Vitamins/Minerals TABS Take by mouth.    Historical Provider, MD   BP 93/59 mmHg  Pulse 71  Temp(Src) 98.2 F (36.8 C) (Oral)  Resp 20  Ht '5\' 1"'$  (1.549 m)  Wt 107 lb 3.2 oz (48.626 kg)  BMI 20.27 kg/m2  SpO2 100% Physical Exam  Constitutional: She is oriented to person, place, and time. She appears well-developed and well-nourished.  HENT:  Head: Normocephalic and atraumatic.  Eyes: Conjunctivae and EOM are normal. Pupils are equal, round, and reactive to light.  Neck: Normal range of motion. Neck supple.  Cardiovascular: Normal rate and regular rhythm.  Exam reveals no gallop and no friction rub.   No murmur heard. Pulmonary/Chest: Effort normal and breath sounds normal. No respiratory distress. She has no wheezes. She has no rales. She exhibits no tenderness.  Abdominal: Soft. Bowel sounds are normal. She exhibits no distension and no mass. There is no tenderness. There is no rebound and no guarding.  Musculoskeletal: Normal range of motion. She exhibits no edema or tenderness.  Moves all extremities  Neurological: She is alert and oriented to person, place, and time.  Cranial nerves grossly intact, speech is pressured, but baseline for patient, movements are goal oriented  Skin: Skin is warm and dry.  Psychiatric: She has a normal mood and affect. Her behavior is normal. Judgment and thought content normal.  Nursing note and vitals reviewed.   ED Course  Procedures (including critical care time) Results for orders placed or performed during the hospital encounter of 05/24/15  CBC  Result Value Ref Range   WBC 10.8 (H) 4.0 - 10.5 K/uL   RBC 4.54 3.87 - 5.11 MIL/uL   Hemoglobin 14.5 12.0 - 15.0 g/dL   HCT 43.1 36.0 - 46.0 %   MCV 94.9 78.0 - 100.0 fL   MCH 31.9 26.0 - 34.0 pg   MCHC 33.6 30.0 - 36.0 g/dL   RDW 12.7 11.5 - 15.5 %   Platelets 251 150 - 400 K/uL  Basic metabolic panel  Result Value Ref Range    Sodium 135 135 - 145 mmol/L   Potassium 4.4 3.5 - 5.1 mmol/L   Chloride 100 (L) 101 - 111 mmol/L   CO2 27 22 - 32 mmol/L   Glucose, Bld 147 (H) 65 - 99 mg/dL   BUN 9 6 - 20 mg/dL   Creatinine, Ser 0.68 0.44 - 1.00 mg/dL   Calcium 9.0 8.9 - 10.3 mg/dL   GFR calc non Af Amer >60 >60 mL/min   GFR calc Af Amer >60 >60 mL/min   Anion gap 8 5 - 15  I-stat troponin, ED  (not at [facility])  Result Value Ref Range   Troponin i, poc 0.00 0.00 - 0.08 ng/mL   Comment 3           Ct Head Wo Contrast  05/24/2015   CLINICAL  DATA:  64 year old female with loss of consciousness.  EXAM: CT HEAD WITHOUT CONTRAST  CT CERVICAL SPINE WITHOUT CONTRAST  TECHNIQUE: Multidetector CT imaging of the head and cervical spine was performed following the standard protocol without intravenous contrast. Multiplanar CT image reconstructions of the cervical spine were also generated.  COMPARISON:  CT dated 05/07/2014  FINDINGS: CT HEAD FINDINGS  The ventricles are dilated and the sulci are prominent compatible with age-related atrophy. Periventricular and deep white matter hypodensities represent chronic microvascular ischemic changes. Left posterior parietal and right inferior frontal lobe old infarcts and encephalomalacia noted. There is no intracranial hemorrhage. No mass effect or midline shift identified.  The visualized paranasal sinuses and mastoid air cells are well aerated. Left parietal craniotomy.  CT CERVICAL SPINE FINDINGS  There is no acute fracture or subluxation of the cervical spine.The intervertebral disc spaces are preserved.Multilevel degenerative changes with mild decreased vertebral body height, chronic. The odontoid and spinous processes are intact.There is normal anatomic alignment of the C1-C2 lateral masses. The visualized soft tissues appear unremarkable.  IMPRESSION: No acute intracranial pathology.  Age-related atrophy and chronic microvascular ischemic disease. Old right frontal and left parietal  infarcts.  If symptoms persist and there are no contraindications, MRI may provide better evaluation if clinically indicated.  No acute cervical spine fracture.   Electronically Signed   By: Anner Crete M.D.   On: 05/24/2015 18:16   Ct Cervical Spine Wo Contrast  05/24/2015   CLINICAL DATA:  64 year old female with loss of consciousness.  EXAM: CT HEAD WITHOUT CONTRAST  CT CERVICAL SPINE WITHOUT CONTRAST  TECHNIQUE: Multidetector CT imaging of the head and cervical spine was performed following the standard protocol without intravenous contrast. Multiplanar CT image reconstructions of the cervical spine were also generated.  COMPARISON:  CT dated 05/07/2014  FINDINGS: CT HEAD FINDINGS  The ventricles are dilated and the sulci are prominent compatible with age-related atrophy. Periventricular and deep white matter hypodensities represent chronic microvascular ischemic changes. Left posterior parietal and right inferior frontal lobe old infarcts and encephalomalacia noted. There is no intracranial hemorrhage. No mass effect or midline shift identified.  The visualized paranasal sinuses and mastoid air cells are well aerated. Left parietal craniotomy.  CT CERVICAL SPINE FINDINGS  There is no acute fracture or subluxation of the cervical spine.The intervertebral disc spaces are preserved.Multilevel degenerative changes with mild decreased vertebral body height, chronic. The odontoid and spinous processes are intact.There is normal anatomic alignment of the C1-C2 lateral masses. The visualized soft tissues appear unremarkable.  IMPRESSION: No acute intracranial pathology.  Age-related atrophy and chronic microvascular ischemic disease. 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  If symptoms persist and there are no contraindications, MRI may provide better evaluation if clinically indicated.  No acute cervical spine fracture.   Electronically Signed   By: Anner Crete M.D.   On: 05/24/2015 18:16       EKG Interpretation   Date/Time:  Friday May 24 2015 15:26:33 EDT Ventricular Rate:  74 PR Interval:  136 QRS Duration: 68 QT Interval:  382 QTC Calculation: 424 R Axis:   13 Text Interpretation:  Normal sinus rhythm Normal ECG Nonspecific T wave  abnormality No significant change since last tracing Confirmed by [place] ([id number]) on 05/24/2015 6:42:53 PM      MDM   Final diagnoses:  Syncope, unspecified syncope type    Patient with syncopal episode today. No prior dizziness, chest pain, shortness of breath. Given history of subdural  hematoma and evacuation, and the fact that the patient fell and is uncertain whether or not she hit her head, will check head CT today.  Will check trop, EKG, and labs.  Anticipate admission for syncope.  Patient discussed with Dr. Tawnya Crook, who agrees with plan for admission secondary to syncope with unknown cause.  Appreciate Dr. Nehemiah Settle from hospitalist service, who will admit the patient.    Montine Circle, PA-C 05/24/15 1950  Ernestina Patches, MD 05/25/15 1120

## 2015-05-24 NOTE — ED Notes (Signed)
Pt's husband wishes to be informed on bed assignment [phone])

## 2015-05-25 ENCOUNTER — Observation Stay (HOSPITAL_COMMUNITY): Payer: Medicare Other

## 2015-05-25 DIAGNOSIS — R55 Syncope and collapse: Secondary | ICD-10-CM

## 2015-05-25 LAB — BASIC METABOLIC PANEL
ANION GAP: 7 (ref 5–15)
BUN: 7 mg/dL (ref 6–20)
CALCIUM: 9 mg/dL (ref 8.9–10.3)
CHLORIDE: 105 mmol/L (ref 101–111)
CO2: 26 mmol/L (ref 22–32)
CREATININE: 0.7 mg/dL (ref 0.44–1.00)
GFR calc non Af Amer: >60 mL/min (ref >60)
Glucose, Bld: 87 mg/dL (ref 65–99)
Potassium: 3.8 mmol/L (ref 3.5–5.1)
SODIUM: 138 mmol/L (ref 135–145)

## 2015-05-25 MED ORDER — MECLIZINE HCL 25 MG PO TABS: 25.0000 mg | ORAL_TABLET | Freq: Two times a day (BID) | ORAL | Status: DC | PRN

## 2015-05-25 NOTE — Progress Notes (Signed)
Patient was discharged home by MD order; discharged instructions review and give to patient with care notes and prescriptions; IV DIC; skin intact; patient will be escorted to the car by nurse tech via wheelchair.  

## 2015-05-25 NOTE — Progress Notes (Signed)
Pt received via stretcher to 5W, rm 18, a/o x4, weakness w/ unsteady gait. SB per telemetry, tele box 18. Plan of care initiated, fall protocols initiated, bed alarm on. Pt enc use of bedpan during hs.  Unable to obtain orthostatic b/p, pt unsteady sitting /dangle in bed, unable to stand. Pt refused d/t fear of falling even w/staff present at bedside. Physician to be notified in am. Skin dry, intact, no s/s of skin breakdown present upon assessment.

## 2015-05-29 NOTE — Discharge Summary (Signed)
Physician Discharge Summary  Claudia Perez WGY:[id number] DOB: 11-02-1951 DOA: 05/24/2015  PCP: Gerrit Heck, MD  Admit date: 05/24/2015 Discharge date: 05/25/2015  Time spent: 35 minutes  Recommendations for Outpatient Follow-up:  1. Patient brought in for observation for syncope having a negative workup.   Discharge Diagnoses:  Active Problems:   Syncope   Faintness   Discharge Condition: Stable  Diet recommendation: Heart Healthy  Filed Weights   05/24/15 1545 05/24/15 2040 05/25/15 0530  Weight: 48.626 kg (107 lb 3.2 oz) 49.5 kg (109 lb 2 oz) 49.5 kg (109 lb 2 oz)    History of present illness:  Claudia Perez is a 64 y.o. 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 The patient had a fall at home earlier today due to a syncopal episode. The patient was standing after eating, when she felt lightheaded and passed out. She will coupled the floor with her sister there. She is uncertain as to how long she was unconscious. She is unaware of whether she struck her head or neck. She has had a prior episode of syncope back in 2014. She does have vertigo at baseline and walks with a walker. Her symptoms were not worse than usual prior to her syncope.  Hospital Course:  Patient with past medical history of subdural hemorrhage secondary to basilar skull fracture admitted to the medicine service and 64 2016 when she presented with complaints of syncope. Patient had been in her usual state health having a syncopal event after eating. She reported feeling lightheaded prior to passing out. This was witnessed by family members who were present. Workup included a CT scan of brain that did not reveal acute intracranial pathology. Troponins were negative. Patient remained stable, suspect symptoms may have been related to vasovagal event. Given clinical stability she was  discharged to home on 05/24/25.   Discharge Exam: Filed Vitals:   05/25/15 0530  BP: 119/58  Pulse: 60  Temp: 98.2 F (36.8 C)  Resp: 18    General: No acute distress awake and alert, following commands Cardiovascular: Regular rate and rhythm, normal S1S2 Respiratory: Nl respiratory effort.   Discharge Instructions   Discharge Instructions    Call MD for:  difficulty breathing, headache or visual disturbances    Complete by:  As directed      Call MD for:  extreme fatigue    Complete by:  As directed      Call MD for:  hives    Complete by:  As directed      Call MD for:  persistant dizziness or light-headedness    Complete by:  As directed      Call MD for:  persistant nausea and vomiting    Complete by:  As directed      Call MD for:  redness, tenderness, or signs of infection (pain, swelling, redness, odor or green/yellow discharge around incision site)    Complete by:  As directed      Call MD for:  severe uncontrolled pain    Complete by:  As directed      Call MD for:  temperature >100.4    Complete by:  As directed      Call MD for:    Complete by:  As directed      Diet - low sodium heart healthy    Complete by:  As directed  Increase activity slowly    Complete by:  As directed           Discharge Medication List as of 05/25/2015  1:43 PM    START taking these medications   Details  meclizine (ANTIVERT) 25 MG tablet Take 1 tablet (25 mg total) by mouth 2 (two) times daily as needed for dizziness., Starting 05/25/2015, Until Discontinued, Print      CONTINUE these medications which have NOT CHANGED   Details  acetaminophen (TYLENOL) 500 MG tablet Take 1,000 mg by mouth every 6 (six) hours as needed for moderate pain., Until Discontinued, Historical Med    citalopram (CELEXA) 10 MG tablet Take 10 mg by mouth daily., Until Discontinued, Historical Med    traMADol (ULTRAM) 50 MG tablet Take 1 tablet (50 mg total) by mouth every 12 (twelve) hours as  needed for moderate pain., Starting 12/07/2014, Until Discontinued, Print    Vitamins/Minerals TABS Take 1 tablet by mouth daily. , Until Discontinued, Historical Med    naproxen (NAPROSYN) 500 MG tablet Take 1 tablet (500 mg total) by mouth 2 (two) times daily with a meal., Starting 10/19/2014, Until Discontinued, Print       Allergies  Allergen Reactions  . Nitrofurantoin Itching  . Phenazopyridine Other (See Comments)  . Sulfamethoxazole-Trimethoprim Itching    Bactrim  . Sulfamethoxazole Rash   Follow-up Information    Follow up with Gerrit Heck, MD In 2 weeks.   Specialty:  Family Medicine   Contact information:   [place] [address] [phone]        The results of significant diagnostics from this hospitalization (including imaging, microbiology, ancillary and laboratory) are listed below for reference.    Significant Diagnostic Studies: Ct Head Wo Contrast  05/24/2015   CLINICAL DATA:  64 year old female with loss of consciousness.  EXAM: CT HEAD WITHOUT CONTRAST  CT CERVICAL SPINE WITHOUT CONTRAST  TECHNIQUE: Multidetector CT imaging of the head and cervical spine was performed following the standard protocol without intravenous contrast. Multiplanar CT image reconstructions of the cervical spine were also generated.  COMPARISON:  CT dated 05/07/2014  FINDINGS: CT HEAD FINDINGS  The ventricles are dilated and the sulci are prominent compatible with age-related atrophy. Periventricular and deep white matter hypodensities represent chronic microvascular ischemic changes. Left posterior parietal and right inferior frontal lobe old infarcts and encephalomalacia noted. There is no intracranial hemorrhage. No mass effect or midline shift identified.  The visualized paranasal sinuses and mastoid air cells are well aerated. Left parietal craniotomy.  CT CERVICAL SPINE FINDINGS  There is no acute fracture or subluxation of the cervical spine.The  intervertebral disc spaces are preserved.Multilevel degenerative changes with mild decreased vertebral body height, chronic. The odontoid and spinous processes are intact.There is normal anatomic alignment of the C1-C2 lateral masses. The visualized soft tissues appear unremarkable.  IMPRESSION: No acute intracranial pathology.  Age-related atrophy and chronic microvascular ischemic disease. 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  If symptoms persist and there are no contraindications, MRI may provide better evaluation if clinically indicated.  No acute cervical spine fracture.   Electronically Signed   By: Anner Crete M.D.   On: 05/24/2015 18:16   Ct Cervical Spine Wo Contrast  05/24/2015   CLINICAL DATA:  64 year old female with loss of consciousness.  EXAM: CT HEAD WITHOUT CONTRAST  CT CERVICAL SPINE WITHOUT CONTRAST  TECHNIQUE: Multidetector CT imaging of the head and cervical spine was performed following the standard protocol without intravenous  contrast. Multiplanar CT image reconstructions of the cervical spine were also generated.  COMPARISON:  CT dated 05/07/2014  FINDINGS: CT HEAD FINDINGS  The ventricles are dilated and the sulci are prominent compatible with age-related atrophy. Periventricular and deep white matter hypodensities represent chronic microvascular ischemic changes. Left posterior parietal and right inferior frontal lobe old infarcts and encephalomalacia noted. There is no intracranial hemorrhage. No mass effect or midline shift identified.  The visualized paranasal sinuses and mastoid air cells are well aerated. Left parietal craniotomy.  CT CERVICAL SPINE FINDINGS  There is no acute fracture or subluxation of the cervical spine.The intervertebral disc spaces are preserved.Multilevel degenerative changes with mild decreased vertebral body height, chronic. The odontoid and spinous processes are intact.There is normal anatomic alignment of the C1-C2 lateral masses. The  visualized soft tissues appear unremarkable.  IMPRESSION: No acute intracranial pathology.  Age-related atrophy and chronic microvascular ischemic disease. 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  If symptoms persist and there are no contraindications, MRI may provide better evaluation if clinically indicated.  No acute cervical spine fracture.   Electronically Signed   By: Anner Crete M.D.   On: 05/24/2015 18:16    Microbiology: No results found for this or any previous visit (from the past 240 hour(s)).   Labs: Basic Metabolic Panel:  Recent Labs Lab 05/24/15 1545 05/25/15 0653  NA 135 138  K 4.4 3.8  CL 100* 105  CO2 27 26  GLUCOSE 147* 87  BUN 9 7  CREATININE 0.68 0.70  CALCIUM 9.0 9.0   Liver Function Tests: No results for input(s): AST, ALT, ALKPHOS, BILITOT, PROT, ALBUMIN in the last 168 hours. No results for input(s): LIPASE, AMYLASE in the last 168 hours. No results for input(s): AMMONIA in the last 168 hours. CBC:  Recent Labs Lab 05/24/15 1545  WBC 10.8*  HGB 14.5  HCT 43.1  MCV 94.9  PLT 251   Cardiac Enzymes: No results for input(s): CKTOTAL, CKMB, CKMBINDEX, TROPONINI in the last 168 hours. BNP: BNP (last 3 results) No results for input(s): BNP in the last 8760 hours.  ProBNP (last 3 results) No results for input(s): PROBNP in the last 8760 hours.  CBG: No results for input(s): GLUCAP in the last 168 hours.     SignedKelvin Cellar  [facility] 05/29/2015, 12:46 PM

## 2015-06-21 ENCOUNTER — Telehealth: Payer: Self-pay | Admitting: Physical Medicine & Rehabilitation

## 2015-06-24 NOTE — Telephone Encounter (Signed)
i'll write a letter when she reschedules her visit!!

## 2015-06-24 NOTE — Telephone Encounter (Signed)
Called patient and explained that we cannot write the letter for her until she comes in for her office visit. She stated that she is going away and I explained again that he needs to see her in the office before and letter will be written. She verbalized understanding

## 2015-06-24 NOTE — Telephone Encounter (Signed)
Patient is requesting a letter for [employer] stating that she is competent to handle her own finances. There is a form on line, but I cannot access it at this time due to security issues

## 2015-07-05 ENCOUNTER — Ambulatory Visit: Payer: Medicare Other | Admitting: Physical Medicine & Rehabilitation

## 2016-06-30 ENCOUNTER — Ambulatory Visit: Payer: Medicare Other | Admitting: Physical Medicine & Rehabilitation

## 2016-07-14 ENCOUNTER — Ambulatory Visit: Payer: Medicare Other | Admitting: Physical Medicine & Rehabilitation

## 2016-12-01 DIAGNOSIS — R5383 Other fatigue: Secondary | ICD-10-CM | POA: Diagnosis not present

## 2016-12-01 DIAGNOSIS — Z8679 Personal history of other diseases of the circulatory system: Secondary | ICD-10-CM | POA: Diagnosis not present

## 2016-12-01 DIAGNOSIS — H6120 Impacted cerumen, unspecified ear: Secondary | ICD-10-CM | POA: Diagnosis not present

## 2016-12-01 DIAGNOSIS — E559 Vitamin D deficiency, unspecified: Secondary | ICD-10-CM | POA: Diagnosis not present

## 2016-12-01 DIAGNOSIS — F329 Major depressive disorder, single episode, unspecified: Secondary | ICD-10-CM | POA: Diagnosis not present

## 2016-12-01 DIAGNOSIS — E7800 Pure hypercholesterolemia, unspecified: Secondary | ICD-10-CM | POA: Diagnosis not present

## 2016-12-01 DIAGNOSIS — R2681 Unsteadiness on feet: Secondary | ICD-10-CM | POA: Diagnosis not present

## 2016-12-01 DIAGNOSIS — K219 Gastro-esophageal reflux disease without esophagitis: Secondary | ICD-10-CM | POA: Diagnosis not present

## 2016-12-04 DIAGNOSIS — H6121 Impacted cerumen, right ear: Secondary | ICD-10-CM | POA: Diagnosis not present

## 2016-12-14 ENCOUNTER — Encounter: Payer: Medicare Other | Attending: Physical Medicine & Rehabilitation | Admitting: Physical Medicine & Rehabilitation

## 2017-06-11 DIAGNOSIS — R634 Abnormal weight loss: Secondary | ICD-10-CM | POA: Diagnosis not present

## 2017-06-11 DIAGNOSIS — Z681 Body mass index (BMI) 19 or less, adult: Secondary | ICD-10-CM | POA: Diagnosis not present

## 2017-06-11 DIAGNOSIS — R2681 Unsteadiness on feet: Secondary | ICD-10-CM | POA: Diagnosis not present

## 2017-06-11 DIAGNOSIS — Z8679 Personal history of other diseases of the circulatory system: Secondary | ICD-10-CM | POA: Diagnosis not present

## 2017-06-11 DIAGNOSIS — E7800 Pure hypercholesterolemia, unspecified: Secondary | ICD-10-CM | POA: Diagnosis not present

## 2017-06-11 DIAGNOSIS — E559 Vitamin D deficiency, unspecified: Secondary | ICD-10-CM | POA: Diagnosis not present

## 2017-06-16 ENCOUNTER — Encounter: Payer: Self-pay | Admitting: Physical Medicine & Rehabilitation

## 2017-06-16 ENCOUNTER — Encounter: Payer: Medicare Other | Attending: Physical Medicine & Rehabilitation | Admitting: Physical Medicine & Rehabilitation

## 2017-06-16 VITALS — BP 115/74 | HR 62

## 2017-06-16 DIAGNOSIS — M84422A Pathological fracture, left humerus, initial encounter for fracture: Secondary | ICD-10-CM | POA: Diagnosis not present

## 2017-06-16 DIAGNOSIS — F329 Major depressive disorder, single episode, unspecified: Secondary | ICD-10-CM | POA: Diagnosis not present

## 2017-06-16 DIAGNOSIS — S0291XS Unspecified fracture of skull, sequela: Secondary | ICD-10-CM | POA: Diagnosis not present

## 2017-06-16 DIAGNOSIS — S069X2S Unspecified intracranial injury with loss of consciousness of 31 minutes to 59 minutes, sequela: Secondary | ICD-10-CM | POA: Insufficient documentation

## 2017-06-16 DIAGNOSIS — R2689 Other abnormalities of gait and mobility: Secondary | ICD-10-CM | POA: Diagnosis not present

## 2017-06-16 DIAGNOSIS — X58XXXS Exposure to other specified factors, sequela: Secondary | ICD-10-CM | POA: Diagnosis not present

## 2017-06-16 DIAGNOSIS — E559 Vitamin D deficiency, unspecified: Secondary | ICD-10-CM | POA: Insufficient documentation

## 2017-06-16 DIAGNOSIS — E0590 Thyrotoxicosis, unspecified without thyrotoxic crisis or storm: Secondary | ICD-10-CM | POA: Insufficient documentation

## 2017-06-16 DIAGNOSIS — R5383 Other fatigue: Secondary | ICD-10-CM | POA: Diagnosis not present

## 2017-06-16 DIAGNOSIS — E7800 Pure hypercholesterolemia, unspecified: Secondary | ICD-10-CM | POA: Diagnosis not present

## 2017-06-16 DIAGNOSIS — K219 Gastro-esophageal reflux disease without esophagitis: Secondary | ICD-10-CM | POA: Diagnosis not present

## 2017-06-16 NOTE — Patient Instructions (Signed)
PLEASE FEEL FREE TO CALL OUR OFFICE WITH ANY PROBLEMS OR QUESTIONS ([phone])      

## 2017-06-16 NOTE — Progress Notes (Signed)
Subjective:    Patient ID: Claudia Perez, female    DOB: [birth year], 66 y.o.   MRN: [id number]  HPI   Claudia Perez is here in follow up of her chronic TBI and gait disorder. She continues to fall every few months, most recently about 3 months ago when she fell on her chin. She lost touch with this office two years ago and hasn't been able to follow up here since. She's currently off all medications. She has been seeing her primary for basic medical care.   She uses a walker for ambulation around the house. She sometimes feels that she's about to black out and has "blacked out" when she fell. I was unable to understand what type of work up her primary had pursued to his point. The patient is concerned that she might have vertigo  Pain Inventory Average Pain 0 Pain Right Now 0 My pain is tingling  In the last 24 hours, has pain interfered with the following? General activity 0 Relation with others 0 Enjoyment of life 0 What TIME of day is your pain at its worst? . Sleep (in general) Fair  Pain is worse with: walking, bending and standing Pain improves with: . Relief from Meds: .  Mobility use a cane use a walker ability to climb steps?  no do you drive?  no  Function disabled: date disabled .  Neuro/Psych bladder control problems bowel control problems weakness tingling trouble walking dizziness loss of taste or smell  Prior Studies Any changes since last visit?  no  Physicians involved in your care Any changes since last visit?  no   Family History  Problem Relation Age of Onset  . Hypertension Mother   . Cirrhosis Brother    Social History   Social History  . Marital status: Married    Spouse name: N/A  . Number of children: N/A  . Years of education: N/A   Social History Main Topics  . Smoking status: Never Smoker  . Smokeless tobacco: Never Used  . Alcohol use Yes     Comment: 03/27/2013 glass of wine "couple times/yr"  . Drug use: No  . Sexual  activity: Not Currently   Other Topics Concern  . None   Social History Narrative  . None   Past Surgical History:  Procedure Laterality Date  . APPENDECTOMY  1960's  . BRAIN SURGERY  10/2003   SDH evac  . EYE SURGERY  02/2013   cataract extraction w/ intraocular lens implant   Past Medical History:  Diagnosis Date  . Abnormality of gait   . Fall at home 03/27/2013   "lost consciousness; fractured right clavicle" (03/27/2013)  . GERD (gastroesophageal reflux disease)   . Hypercholesteremia   . Hyperthyroidism    "a little bit; don't take RX for it" (03/27/2013)  . Mobility impaired   . Other closed skull fracture without mention of intracranial injury, unspecified state of consciousness   . Seizures ([place])    "think I might have; been blacking out several times in the past" (03/27/2013)  . Shortness of breath    "even now, just talking" (03/27/2013)  . Subdural hemorrhage ([place])   . Vitamin D deficiency    BP 115/74   Pulse 62   SpO2 97%   Opioid Risk Score:   Fall Risk Score:  `1  Depression screen PHQ 2/9  No flowsheet data found.   Review of Systems  Constitutional: Negative.   HENT: Negative.   Eyes: Negative.  Respiratory: Positive for apnea, cough and wheezing.   Cardiovascular: Negative.   Gastrointestinal: Negative.   Endocrine: Negative.   Genitourinary: Negative.   Musculoskeletal: Negative.   Skin: Negative.   Allergic/Immunologic: Negative.   Neurological: Negative.   Hematological: Negative.   Psychiatric/Behavioral: Negative.   All other systems reviewed and are negative.      Objective:   Physical Exam  HENT:  Head: Normocephalic. No tenderness over frontal sinuses  Eyes: Conjunctivae are normal. Pupils are equal, round, and reactive to light.  Neck: Normal range of motion.  Cardiovascular: RRR.  Respiratory: normal effort GI: Soft. Bowel sounds are normal. She exhibits no distension. There is no tenderness.  Musculoskeletal: left  shoulder atrophy/callus. Has more pain with abduction at 90 and above.  Neurological: She is alert and oriented to person, place, and time.  Good attention and focus. Gait is wide-based, fairly stable. Still weak in trunk. Takes her time.  Strength 4/5. Poor trunk control/leans backwards when standing. I was unable to elicit any vesitbular responses or nystagmus during exam Psychiatric: She is pleasant as always.       Assessment & Plan:  Assessment/Plan:  1. 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 Multifactorial gait disorder/syncope--largely related to chronic effects of her TBI    -?bppv  -reviewed safety with her again today  -made referral to [facility] neuro-rehab  -there is also a reasonable suspicion that dizziness/falls are cardiovascular related. Her assessment in therapy should help shed a light on this some more.  3. Left humeral neck fracture- old  4. Continue celexa for depression  5. Fatigue/attention deficits:   -multifactorial causes associated with chronic effects of her BI   -past thyroid testing essentially normal--would not change regimen  -recommended  B complex, Coenzyme Q10, vitamin D.   15 minutes of face to face patient care time were spent during this visit. All questions were encouraged and answered. i'll see her back in a month

## 2017-07-06 DIAGNOSIS — G241 Genetic torsion dystonia: Secondary | ICD-10-CM | POA: Diagnosis not present

## 2017-07-06 DIAGNOSIS — Z681 Body mass index (BMI) 19 or less, adult: Secondary | ICD-10-CM | POA: Diagnosis not present

## 2017-07-06 DIAGNOSIS — F329 Major depressive disorder, single episode, unspecified: Secondary | ICD-10-CM | POA: Diagnosis not present

## 2017-07-06 DIAGNOSIS — E7800 Pure hypercholesterolemia, unspecified: Secondary | ICD-10-CM | POA: Diagnosis not present

## 2017-07-06 DIAGNOSIS — E559 Vitamin D deficiency, unspecified: Secondary | ICD-10-CM | POA: Diagnosis not present

## 2017-07-06 DIAGNOSIS — R296 Repeated falls: Secondary | ICD-10-CM | POA: Diagnosis not present

## 2017-07-06 DIAGNOSIS — K219 Gastro-esophageal reflux disease without esophagitis: Secondary | ICD-10-CM | POA: Diagnosis not present

## 2017-07-08 DIAGNOSIS — R296 Repeated falls: Secondary | ICD-10-CM | POA: Diagnosis not present

## 2017-07-08 DIAGNOSIS — F329 Major depressive disorder, single episode, unspecified: Secondary | ICD-10-CM | POA: Diagnosis not present

## 2017-07-08 DIAGNOSIS — G241 Genetic torsion dystonia: Secondary | ICD-10-CM | POA: Diagnosis not present

## 2017-07-08 DIAGNOSIS — E559 Vitamin D deficiency, unspecified: Secondary | ICD-10-CM | POA: Diagnosis not present

## 2017-07-08 DIAGNOSIS — K219 Gastro-esophageal reflux disease without esophagitis: Secondary | ICD-10-CM | POA: Diagnosis not present

## 2017-07-08 DIAGNOSIS — E7800 Pure hypercholesterolemia, unspecified: Secondary | ICD-10-CM | POA: Diagnosis not present

## 2017-07-12 DIAGNOSIS — E559 Vitamin D deficiency, unspecified: Secondary | ICD-10-CM | POA: Diagnosis not present

## 2017-07-12 DIAGNOSIS — R296 Repeated falls: Secondary | ICD-10-CM | POA: Diagnosis not present

## 2017-07-12 DIAGNOSIS — E7800 Pure hypercholesterolemia, unspecified: Secondary | ICD-10-CM | POA: Diagnosis not present

## 2017-07-12 DIAGNOSIS — K219 Gastro-esophageal reflux disease without esophagitis: Secondary | ICD-10-CM | POA: Diagnosis not present

## 2017-07-12 DIAGNOSIS — F329 Major depressive disorder, single episode, unspecified: Secondary | ICD-10-CM | POA: Diagnosis not present

## 2017-07-12 DIAGNOSIS — G241 Genetic torsion dystonia: Secondary | ICD-10-CM | POA: Diagnosis not present

## 2017-07-13 DIAGNOSIS — E559 Vitamin D deficiency, unspecified: Secondary | ICD-10-CM | POA: Diagnosis not present

## 2017-07-13 DIAGNOSIS — R296 Repeated falls: Secondary | ICD-10-CM | POA: Diagnosis not present

## 2017-07-13 DIAGNOSIS — G241 Genetic torsion dystonia: Secondary | ICD-10-CM | POA: Diagnosis not present

## 2017-07-13 DIAGNOSIS — F329 Major depressive disorder, single episode, unspecified: Secondary | ICD-10-CM | POA: Diagnosis not present

## 2017-07-13 DIAGNOSIS — E7800 Pure hypercholesterolemia, unspecified: Secondary | ICD-10-CM | POA: Diagnosis not present

## 2017-07-13 DIAGNOSIS — K219 Gastro-esophageal reflux disease without esophagitis: Secondary | ICD-10-CM | POA: Diagnosis not present

## 2017-07-16 DIAGNOSIS — G241 Genetic torsion dystonia: Secondary | ICD-10-CM | POA: Diagnosis not present

## 2017-07-16 DIAGNOSIS — E7800 Pure hypercholesterolemia, unspecified: Secondary | ICD-10-CM | POA: Diagnosis not present

## 2017-07-16 DIAGNOSIS — F329 Major depressive disorder, single episode, unspecified: Secondary | ICD-10-CM | POA: Diagnosis not present

## 2017-07-16 DIAGNOSIS — K219 Gastro-esophageal reflux disease without esophagitis: Secondary | ICD-10-CM | POA: Diagnosis not present

## 2017-07-16 DIAGNOSIS — R296 Repeated falls: Secondary | ICD-10-CM | POA: Diagnosis not present

## 2017-07-16 DIAGNOSIS — E559 Vitamin D deficiency, unspecified: Secondary | ICD-10-CM | POA: Diagnosis not present

## 2017-07-19 DIAGNOSIS — R296 Repeated falls: Secondary | ICD-10-CM | POA: Diagnosis not present

## 2017-07-19 DIAGNOSIS — E7800 Pure hypercholesterolemia, unspecified: Secondary | ICD-10-CM | POA: Diagnosis not present

## 2017-07-19 DIAGNOSIS — K219 Gastro-esophageal reflux disease without esophagitis: Secondary | ICD-10-CM | POA: Diagnosis not present

## 2017-07-19 DIAGNOSIS — E559 Vitamin D deficiency, unspecified: Secondary | ICD-10-CM | POA: Diagnosis not present

## 2017-07-19 DIAGNOSIS — G241 Genetic torsion dystonia: Secondary | ICD-10-CM | POA: Diagnosis not present

## 2017-07-19 DIAGNOSIS — F329 Major depressive disorder, single episode, unspecified: Secondary | ICD-10-CM | POA: Diagnosis not present

## 2017-07-20 ENCOUNTER — Telehealth: Payer: Self-pay | Admitting: *Deleted

## 2017-07-20 DIAGNOSIS — K219 Gastro-esophageal reflux disease without esophagitis: Secondary | ICD-10-CM | POA: Diagnosis not present

## 2017-07-20 DIAGNOSIS — G241 Genetic torsion dystonia: Secondary | ICD-10-CM | POA: Diagnosis not present

## 2017-07-20 DIAGNOSIS — F329 Major depressive disorder, single episode, unspecified: Secondary | ICD-10-CM | POA: Diagnosis not present

## 2017-07-20 DIAGNOSIS — E559 Vitamin D deficiency, unspecified: Secondary | ICD-10-CM | POA: Diagnosis not present

## 2017-07-20 DIAGNOSIS — E7800 Pure hypercholesterolemia, unspecified: Secondary | ICD-10-CM | POA: Diagnosis not present

## 2017-07-20 DIAGNOSIS — R296 Repeated falls: Secondary | ICD-10-CM | POA: Diagnosis not present

## 2017-07-20 NOTE — Telephone Encounter (Signed)
I gave him the number to call outpt neuro rehab. They are waiting on his call to schedule.

## 2017-07-20 NOTE — Telephone Encounter (Signed)
Claudia Perez has not heard from outpt neuro rehab. (referral was made July 18 th.Can you check on this please?

## 2017-07-22 DIAGNOSIS — K219 Gastro-esophageal reflux disease without esophagitis: Secondary | ICD-10-CM | POA: Diagnosis not present

## 2017-07-22 DIAGNOSIS — R296 Repeated falls: Secondary | ICD-10-CM | POA: Diagnosis not present

## 2017-07-22 DIAGNOSIS — E7800 Pure hypercholesterolemia, unspecified: Secondary | ICD-10-CM | POA: Diagnosis not present

## 2017-07-22 DIAGNOSIS — F329 Major depressive disorder, single episode, unspecified: Secondary | ICD-10-CM | POA: Diagnosis not present

## 2017-07-22 DIAGNOSIS — G241 Genetic torsion dystonia: Secondary | ICD-10-CM | POA: Diagnosis not present

## 2017-07-22 DIAGNOSIS — E559 Vitamin D deficiency, unspecified: Secondary | ICD-10-CM | POA: Diagnosis not present

## 2017-07-23 DIAGNOSIS — R296 Repeated falls: Secondary | ICD-10-CM | POA: Diagnosis not present

## 2017-07-23 DIAGNOSIS — K219 Gastro-esophageal reflux disease without esophagitis: Secondary | ICD-10-CM | POA: Diagnosis not present

## 2017-07-23 DIAGNOSIS — G241 Genetic torsion dystonia: Secondary | ICD-10-CM | POA: Diagnosis not present

## 2017-07-23 DIAGNOSIS — E559 Vitamin D deficiency, unspecified: Secondary | ICD-10-CM | POA: Diagnosis not present

## 2017-07-23 DIAGNOSIS — F329 Major depressive disorder, single episode, unspecified: Secondary | ICD-10-CM | POA: Diagnosis not present

## 2017-07-23 DIAGNOSIS — E7800 Pure hypercholesterolemia, unspecified: Secondary | ICD-10-CM | POA: Diagnosis not present

## 2017-07-26 ENCOUNTER — Ambulatory Visit: Payer: Medicare Other | Attending: Family Medicine | Admitting: Physical Therapy

## 2017-07-26 DIAGNOSIS — H8113 Benign paroxysmal vertigo, bilateral: Secondary | ICD-10-CM | POA: Diagnosis not present

## 2017-07-26 DIAGNOSIS — R29818 Other symptoms and signs involving the nervous system: Secondary | ICD-10-CM | POA: Diagnosis not present

## 2017-07-26 DIAGNOSIS — R2681 Unsteadiness on feet: Secondary | ICD-10-CM | POA: Diagnosis not present

## 2017-07-26 DIAGNOSIS — M6281 Muscle weakness (generalized): Secondary | ICD-10-CM | POA: Diagnosis not present

## 2017-07-27 ENCOUNTER — Ambulatory Visit: Payer: Medicare Other | Admitting: Physical Therapy

## 2017-07-27 ENCOUNTER — Encounter: Payer: Self-pay | Admitting: Physical Therapy

## 2017-07-27 DIAGNOSIS — F329 Major depressive disorder, single episode, unspecified: Secondary | ICD-10-CM | POA: Diagnosis not present

## 2017-07-27 DIAGNOSIS — K219 Gastro-esophageal reflux disease without esophagitis: Secondary | ICD-10-CM | POA: Diagnosis not present

## 2017-07-27 DIAGNOSIS — R2681 Unsteadiness on feet: Secondary | ICD-10-CM | POA: Diagnosis not present

## 2017-07-27 DIAGNOSIS — E7800 Pure hypercholesterolemia, unspecified: Secondary | ICD-10-CM | POA: Diagnosis not present

## 2017-07-27 DIAGNOSIS — G241 Genetic torsion dystonia: Secondary | ICD-10-CM | POA: Diagnosis not present

## 2017-07-27 DIAGNOSIS — M6281 Muscle weakness (generalized): Secondary | ICD-10-CM | POA: Diagnosis not present

## 2017-07-27 DIAGNOSIS — H8113 Benign paroxysmal vertigo, bilateral: Secondary | ICD-10-CM

## 2017-07-27 DIAGNOSIS — R29818 Other symptoms and signs involving the nervous system: Secondary | ICD-10-CM | POA: Diagnosis not present

## 2017-07-27 DIAGNOSIS — E559 Vitamin D deficiency, unspecified: Secondary | ICD-10-CM | POA: Diagnosis not present

## 2017-07-27 DIAGNOSIS — R296 Repeated falls: Secondary | ICD-10-CM | POA: Diagnosis not present

## 2017-07-27 NOTE — Therapy (Signed)
[facility] [address] [place], [place], [address] Phone: [phone]   Fax:  [phone]  Physical Therapy Treatment  Patient Details  Name: Claudia Perez MRN: [id number] Date of Birth: 09-03-1951 Referring Provider: Alger Simons MD  Encounter Date: 07/27/2017      PT End of Session - 07/27/17 2145    Visit Number 2   Number of Visits 17   Date for PT Re-Evaluation 09/24/17   Authorization Type Medicare, BCBS secondary   Authorization Time Period 07/26/17  to 09/24/17   PT Start Time 1451  pt arrived late   PT Stop Time 1531   PT Time Calculation (min) 40 min   Equipment Utilized During Treatment Gait belt   Activity Tolerance Patient tolerated treatment well   Behavior During Therapy [facility] for tasks assessed/performed      Past Medical History:  Diagnosis Date  . Abnormality of gait   . Fall at home 03/27/2013   "lost consciousness; fractured right clavicle" (03/27/2013)  . GERD (gastroesophageal reflux disease)   . Hypercholesteremia   . Hyperthyroidism    "a little bit; don't take RX for it" (03/27/2013)  . Mobility impaired   . Other closed skull fracture without mention of intracranial injury, unspecified state of consciousness   . Seizures ([place])    "think I might have; been blacking out several times in the past" (03/27/2013)  . Shortness of breath    "even now, just talking" (03/27/2013)  . Subdural hemorrhage ([place])   . Vitamin D deficiency     Past Surgical History:  Procedure Laterality Date  . APPENDECTOMY  1960's  . BRAIN SURGERY  10/2003   SDH evac  . EYE SURGERY  02/2013   cataract extraction w/ intraocular lens implant    There were no vitals filed for this visit.      Subjective Assessment - 07/27/17 1454    Subjective (P)  Feels the treatment yesterday helped her vertigo. Is not sure she has felt any vertigo today.    Patient is accompained by: (P)  Family member     Pertinent  History (P)  Left posterior parietal and right inferior frontal lobe old infarcts, vertigo,    Limitations (P)  Walking   How long can you walk comfortably? (P)  not too far   Patient Stated Goals (P)  be able to walk with her walker on her own; cure the vertigo   Currently in Pain? (P)  No/denies                Vestibular Assessment - 07/27/17 2135      Positional Testing   Dix-Hallpike Dix-Hallpike Right;Dix-Hallpike Left   Horizontal Canal Testing Horizontal Canal Right;Horizontal Canal Left     Dix-Hallpike Right   Dix-Hallpike Right Duration 0   Dix-Hallpike Right Symptoms No nystagmus     Dix-Hallpike Left   Dix-Hallpike Left Duration 0   Dix-Hallpike Left Symptoms No nystagmus     Horizontal Canal Right   Horizontal Canal Right Duration 0   Horizontal Canal Right Symptoms Normal     Horizontal Canal Left   Horizontal Canal Left Duration 0   Horizontal Canal Left Symptoms Normal                 [facility] Adult PT Treatment/Exercise - 07/27/17 0001      Bed Mobility   Bed Mobility Rolling Right;Right Sidelying to Sit;Sit to Supine   Rolling Right 4: Min assist  Right Sidelying to Sit 2: Max assist   Sit to Supine 4: Min assist     Ambulation/Gait   Ambulation/Gait Yes   Ambulation/Gait Assistance 4: Min assist   Ambulation/Gait Assistance Details standing rest break x 3   Ambulation Distance (Feet) 40 Feet   Assistive device Rolling walker   Gait Pattern Step-through pattern;Decreased stride length;Decreased hip/knee flexion - right;Decreased hip/knee flexion - left;Decreased weight shift to right;Decreased weight shift to left;Shuffle;Poor foot clearance - left;Poor foot clearance - right   Ambulation Surface Level   Gait velocity 4 ft/6.97 sec=0.57 ft/sec     Standardized Balance Assessment   Standardized Balance Assessment Timed Up and Go Test     Timed Up and Go Test   TUG Normal TUG   Normal TUG (seconds) 67.09     Exercises   Exercises  Lumbar     Lumbar Exercises: Supine   Bridge 10 reps;5 seconds   Bridge Limitations lifting less than full ROM as that increases her back pain         Vestibular Treatment/Exercise - 07/27/17 0001      Vestibular Treatment/Exercise   Vestibular Treatment Provided Canalith Repositioning   Canalith Repositioning Epley Manuever Left      EPLEY MANUEVER LEFT   Number of Reps  1   Overall Response  Improved Symptoms    RESPONSE DETAILS LEFT first step 0 symptoms, 2nd step ? slight bit of vertigo, 3rd position vertigo x 5 sec, 4th position nome               PT Education - 07/27/17 2145    Education provided Yes   Education Details HEP   Person(s) Educated Patient   Methods Explanation;Demonstration;Verbal cues;Handout   Comprehension Verbalized understanding;Returned demonstration;Need further instruction          PT Short Term Goals - 07/26/17 1252      PT SHORT TERM GOAL #1   Title Patient will be asymptomatic with BPPV assessments.    Time 4   Period Weeks   Status New   Target Date 08/25/17     PT SHORT TERM GOAL #2   Title Patient will complete TUG and gait velocity with goals set (as appropriate).    Time 1   Period Weeks   Status New   Target Date 08/02/17     PT SHORT TERM GOAL #3   Title Patient will be independent with HEP for strengthening and balance.    Time 4   Status New   Target Date 08/25/17     PT SHORT TERM GOAL #4   Title Patient will ambulate 60 ft without standing rest break with RW and supervision.    Time 4   Period Weeks   Status New   Target Date 08/25/17           PT Long Term Goals - 07/27/17 0556      PT LONG TERM GOAL #1   Title Patient will be independent with updated HEP    Time 8   Period Weeks   Status New   Target Date 09/25/17     PT LONG TERM GOAL #2   Title Patient will ambulate modified independent 100 ft with RW without standing rest break.   Time 8   Period Weeks   Status New   Target Date  09/25/17     PT LONG TERM GOAL #3   Title Patient's gait velocity will improve by 10% over measure at 4  weeks (as a measure of decreased fall risk).    Time 8   Period Weeks   Status New   Target Date 09/25/17     PT LONG TERM GOAL #4   Title Patient's Functional Status score on FOTO will improve to >30 as a measure of improved function and patient's confidence in her ability.   Time 8   Period Weeks   Status New   Target Date 09/25/17               Plan - 07/27/17 2147    Clinical Impression Statement Patient reported improvement in vertigo and uncertain if she had felt any vertigo at all today. Vestibular assessment and treatment completed and pt denied vertigo remainder of session. Further fall risk predictors completed and indicative of high fall risk.. Patient can continue to benefit from skilled PT   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   PT Frequency 2x / week   PT Duration 8 weeks   PT Treatment/Interventions ADLs/Self Care Home Management;Canalith Repostioning;DME Instruction;Gait training;Stair training;Functional mobility training;Therapeutic activities;Therapeutic exercise;Balance training;Neuromuscular re-education;Patient/family education;Energy conservation;Vestibular   PT Next Visit Plan add to HEP for strengthening (including core) and balance; pre-gait and gait training for incr wt-shift, step length, heelstrike, etc   Consulted and Agree with Plan of Care Patient      Patient will benefit from skilled therapeutic intervention in order to improve the following deficits and impairments:  Abnormal gait, Decreased activity tolerance, Decreased balance, Decreased coordination, Decreased endurance, Decreased knowledge of use of DME, Decreased mobility, Decreased strength, Dizziness, Impaired UE functional use  Visit Diagnosis: BPPV (benign paroxysmal positional vertigo),  bilateral  Unsteady gait  Muscle weakness (generalized)     Problem List Patient Active Problem List   Diagnosis Date Noted  . Faintness   . Left rotator cuff tear 02/01/2015  . Closed left humeral fracture 12/07/2014  . Fatigue 12/07/2014  . 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 Dystonia 08/10/2014  . Syncope and collapse 06/08/2014  . Orthostatic hypotension 06/08/2014  . 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 Traumatic brain injury with loss of consciousness of 31 minutes to 59 minutes ([place]) 12/14/2013  . Fall down stairs 12/11/2013  . Basilar skull fracture ([place]) 12/11/2013  . Traumatic intracranial hemorrhage ([place]) 12/11/2013  . Back pain 12/11/2013  . Subarachnoid hemorrhage following injury ([place]) 12/09/2013  . Syncope 03/27/2013  . Clavicular fracture 03/27/2013  . COUGH 12/28/2007    Rexanne Mano, PT 07/27/2017, 9:55 PM  [facility] [address] [place], [place], [address] Phone: [phone]   Fax:  [phone]  Name: Claudia Perez MRN: [id number] Date of Birth: 12/28/1950

## 2017-07-27 NOTE — Patient Instructions (Signed)
Bridge    Lie back, legs bent. Space between your feet and knees. Lift your hips up off the bed (not quite as high as this picture). Hold for a count of 5. Repeat _10__ times. Do __2__ sessions per day.  http://pm.exer.us/55   Copyright  VHI. All rights reserved.

## 2017-07-27 NOTE — Therapy (Signed)
[facility] [address] [place], [place], [address] Phone: [phone]   Fax:  [phone]  Physical Therapy Evaluation  Patient Details  Name: Claudia Perez MRN: [id number] Date of Birth: 1951-09-05 Referring Provider: Alger Simons MD  Encounter Date: 07/26/2017      PT End of Session - 07/26/17 1226    Visit Number 1   Number of Visits 17   Date for PT Re-Evaluation 09/24/17   Authorization Type Medicare, BCBS secondary   Authorization Time Period 07/26/17  to 09/24/17   PT Start Time 1104   PT Stop Time 1140   PT Time Calculation (min) 36 min   Activity Tolerance Patient tolerated treatment well   Behavior During Therapy [facility] for tasks assessed/performed      Past Medical History:  Diagnosis Date  . Abnormality of gait   . Fall at home 03/27/2013   "lost consciousness; fractured right clavicle" (03/27/2013)  . GERD (gastroesophageal reflux disease)   . Hypercholesteremia   . Hyperthyroidism    "a little bit; don't take RX for it" (03/27/2013)  . Mobility impaired   . Other closed skull fracture without mention of intracranial injury, unspecified state of consciousness   . Seizures ([place])    "think I might have; been blacking out several times in the past" (03/27/2013)  . Shortness of breath    "even now, just talking" (03/27/2013)  . Subdural hemorrhage ([place])   . Vitamin D deficiency     Past Surgical History:  Procedure Laterality Date  . APPENDECTOMY  1960's  . BRAIN SURGERY  10/2003   SDH evac  . EYE SURGERY  02/2013   cataract extraction w/ intraocular lens implant    There were no vitals filed for this visit.       Subjective Assessment - 07/26/17 1112    Subjective I fall frequently and I think it is vertigo. Last was about 5 mos ago, hit her chin, required stitches (she was in a [place] in [place] at the time). Before I fell I used my walker, but I was walking better than this. I could use  my walker and walk by myself, now I need one person behind me and sometimes 2 people beside me.    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 not too far   Patient Stated Goals be able to walk with her walker on her own; cure the vertigo   Currently in Pain? 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 3  all were when she was turning to her right   Has the patient had a decrease in activity level because of a fear of falling?  Yes   Is the patient reluctant to leave their home because of a fear  of falling?  Yes     [place] Private residence   Living Arrangements Spouse/significant other;Children  daughter in high school   Available Help at Discharge Family;Available PRN/intermittently  husband works part-time (4 hrs/day)   Type of [place] Access Level entry   Home Layout One level   [place] - 2 wheels     Prior Function   Level of Independence Needs assistance with ADLs;Needs assistance with gait   Comments someone with her at all times when walking; does not get into shower, her husband helps her bathe at the sink     Cognition   Overall Cognitive Status Within Functional Limits for tasks assessed     Observation/Other Assessments   Observations pt required 4 standing rest periods to walk 60 ft with shuffling steps   Focus on Therapeutic  Outcomes (FOTO)  FS 12 (risk adjusted to 50);    Neuro Quality of Life  LE 15.7     Sensation   Light Touch Appears Intact     Coordination   Gross Motor Movements are Fluid and Coordinated No   Coordination and Movement Description spastic appearing movements bil LEs during ROM/strength testing     ROM / Strength   AROM / PROM / Strength Strength     Strength   Overall Strength Deficits   Strength Assessment Site Knee;Hip;Ankle   Right/Left Hip Right;Left   Right Hip Flexion 3+/5   Left Hip Flexion 4/5   Right/Left Knee Left;Right   Right Knee Flexion 3-/5   Right Knee Extension 4/5   Left Knee Flexion 4+/5   Left Knee Extension 4+/5   Right/Left Ankle Right;Left   Right Ankle Dorsiflexion 4/5   Left Ankle Dorsiflexion 4+/5     Bed Mobility   Bed Mobility Rolling Right;Right Sidelying to Sit;Sit to Sidelying Right   Rolling Right 4: Min assist  as part of Epley   Right Sidelying to Sit 2: Max assist   Right Sidelying to Sit Details (indicate cue type and reason) due to extrememly limited ROM bil Shoulders    Sit to Sidelying Right 4: Min assist;HOB flat     Transfers   Transfers Sit to Stand   Sit to Stand 4: Min guard   Transfer Cueing vc for safe hand placement; min guard for safety due to reports of vertigo     Ambulation/Gait   Ambulation/Gait Yes   Ambulation/Gait Assistance 4: Min assist   Ambulation/Gait Assistance Details standing rest x 4; very short step length, required assist with one LOB posteriorly, otherwise able to correct balance herself using RW   Ambulation Distance (Feet) 60 Feet  50   Assistive device Rolling walker   Gait Pattern Step-through pattern;Decreased stride length;Decreased hip/knee flexion - right;Decreased hip/knee flexion - left;Decreased weight shift to right;Decreased weight shift to left;Shuffle;Poor foot clearance - left;Poor foot clearance - right   Ambulation Surface Level;Indoor   Gait velocity TBA             Vestibular Assessment - 07/26/17 1219      Vestibular Assessment   General Observation pt reports spinning each morning when she first sits and then stands up; also if she is sitting and reaaches down to the floor.     Symptom Behavior   Type of Dizziness Spinning   Frequency of Dizziness daily   Duration of Dizziness seconds   Aggravating Factors Supine to sit;Sit to stand;Forward bending  Relieving Factors Head stationary     Positional Testing   Dix-Hallpike Dix-Hallpike Left   Sidelying Test Sidelying Right     Dix-Hallpike Left   Dix-Hallpike Left Duration 30 sec   Dix-Hallpike Left Symptoms Upbeat, left rotatory nystagmus     Sidelying Right   Sidelying Right Duration 0   Sidelying Right Symptoms No nystagmus  no symptoms (she lies to her right getting into bed     Cognition   Cognition Orientation Level Oriented x 4        Objective measurements completed on examination: See above findings.           Vestibular Treatment/Exercise - 07/26/17 0001      Vestibular Treatment/Exercise   Vestibular Treatment Provided Canalith Repositioning   Canalith Repositioning Epley Manuever Left   Habituation Exercises Nestor Lewandowsky  pt remembers from prior BPPV      EPLEY MANUEVER LEFT   Number of Reps  1   Overall Response  Improved Symptoms    RESPONSE DETAILS LEFT on final step of Epley (come to sitting) she felt much less dizzy than previously               PT Education - 07/26/17 1225    Education provided Yes   Education Details +BPPV Left posterior canal; not enough time to test Rt but will on next visit (if remains symptomatic); PT POC   Person(s) Educated Patient   Methods Explanation   Comprehension Verbalized understanding          PT Short Term Goals - 07/26/17 1252      PT SHORT TERM GOAL #1   Title Patient will be asymptomatic with BPPV assessments.    Time 4   Period Weeks   Status New   Target Date 08/25/17     PT SHORT TERM  GOAL #2   Title Patient will complete TUG and gait velocity with goals set (as appropriate).    Time 1   Period Weeks   Status New   Target Date 08/02/17     PT SHORT TERM GOAL #3   Title Patient will be independent with HEP for strengthening and balance.    Time 4   Status New   Target Date 08/25/17     PT SHORT TERM GOAL #4   Title Patient will ambulate 60 ft without standing rest break with RW and supervision.    Time 4   Period Weeks   Status New   Target Date 08/25/17           PT Long Term Goals - 07/27/17 0556      PT LONG TERM GOAL #1   Title Patient will be independent with updated HEP    Time 8   Period Weeks   Status New   Target Date 09/25/17     PT LONG TERM GOAL #2   Title Patient will ambulate modified independent 100 ft with RW without standing rest break.   Time 8   Period Weeks   Status New   Target Date 09/25/17     PT LONG TERM GOAL #3   Title Patient's gait velocity will improve by 10% over measure at 4 weeks (as a measure of decreased fall risk).    Time 8   Period Weeks   Status New   Target Date 09/25/17     PT LONG TERM GOAL #4   Title Patient's Functional Status score on FOTO will improve to >30 as  a measure of improved function and patient's confidence in her ability.   Time 8   Period Weeks   Status New   Target Date 09/25/17                Plan - 07/26/17 February 25, 1227    Clinical Impression Statement Mrs. Magri seen for OPPT evaluation due to gait disorder, multiple falls, and BPPV. She reports the vertigo began after a bad fall 5 months ago (when she was [place]) when she hit her chin hard enough to need stitches. She has been doing the Brandt-Daroff exercises she was given with previous BPPV treatment, however this has not resolved her vertigo. Due to her vertigo, she has only been walking with assist (and her RW) and feels she has gotten much weaker due to lack of activity. Patient will benefit from the PT  interventions outlined below to address the deficits/problems listed below.    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; h/o BPPV; h/o syncopal spells (etiology unknown per most recent MD note); Personal factors- prior level of function   Clinical Presentation Evolving   Clinical Presentation due to: Will need to reassess other canals if dizziness persists after today's treatment   Clinical Decision Making Moderate   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   PT Frequency 2x / week   PT Duration 8 weeks   PT Treatment/Interventions ADLs/Self Care Home Management;Canalith Repostioning;DME Instruction;Gait training;Stair training;Functional mobility training;Therapeutic activities;Therapeutic exercise;Balance training;Neuromuscular re-education;Patient/family education;Energy conservation;Vestibular   PT Next Visit Plan reassess for lt posterior BPPV, assess all other canals for BPPV; review Brandt-Daroff; assess TUG and gait velocity   Consulted and Agree with Plan of Care Patient      Patient will benefit from skilled therapeutic intervention in order to improve the following deficits and impairments:  Abnormal gait, Decreased activity tolerance, Decreased balance, Decreased coordination, Decreased endurance, Decreased knowledge of use of DME, Decreased mobility, Decreased strength, Dizziness, Impaired UE functional use  Visit Diagnosis: BPPV (benign paroxysmal positional vertigo), bilateral - Plan: PT plan of care cert/re-cert  Unsteady gait - Plan: PT plan of care cert/re-cert  Muscle weakness (generalized) - Plan: PT plan of care cert/re-cert  Other symptoms and signs involving the nervous system - Plan: PT plan of care cert/re-cert      G-Codes - 02/54/27 0605    Functional  Assessment Tool Used (Outpatient Only) Ambulates 60 ft with min assist and RW with 4 standing rest breaks   Functional Limitation Mobility: Walking and moving around   Mobility: Walking and Moving Around Current Status (C6237) At least 60 percent but less than 80 percent impaired, limited or restricted   Mobility: Walking and Moving Around Goal Status [phone]) At least 20 percent but less than 40 percent impaired, limited or restricted       Problem List Patient Active Problem List   Diagnosis Date Noted  . Faintness   . Left rotator cuff tear 02/01/2015  . Closed left humeral fracture 12/07/2014  . Fatigue 12/07/2014  . 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 Dystonia 08/10/2014  . Syncope and collapse 06/08/2014  . Orthostatic hypotension 06/08/2014  . 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 Fall down stairs 12/11/2013  . Basilar skull fracture ([place]) 12/11/2013  . 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 Back pain 12/11/2013  . Subarachnoid hemorrhage following injury ([place]) 12/09/2013  . Syncope 03/27/2013  . Clavicular fracture 03/27/2013  . COUGH 12/28/2007    Rexanne Mano, PT 07/27/2017, 6:11 AM  [facility] [address] [place], [place], [address] Phone: [phone]   Fax:  [phone]  Name: Claudia Perez MRN: [id number] Date of Birth: 1951-01-07

## 2017-07-28 DIAGNOSIS — R296 Repeated falls: Secondary | ICD-10-CM | POA: Diagnosis not present

## 2017-07-28 DIAGNOSIS — G241 Genetic torsion dystonia: Secondary | ICD-10-CM | POA: Diagnosis not present

## 2017-07-28 DIAGNOSIS — E559 Vitamin D deficiency, unspecified: Secondary | ICD-10-CM | POA: Diagnosis not present

## 2017-07-28 DIAGNOSIS — E7800 Pure hypercholesterolemia, unspecified: Secondary | ICD-10-CM | POA: Diagnosis not present

## 2017-07-28 DIAGNOSIS — K219 Gastro-esophageal reflux disease without esophagitis: Secondary | ICD-10-CM | POA: Diagnosis not present

## 2017-07-28 DIAGNOSIS — F329 Major depressive disorder, single episode, unspecified: Secondary | ICD-10-CM | POA: Diagnosis not present

## 2017-07-29 DIAGNOSIS — J302 Other seasonal allergic rhinitis: Secondary | ICD-10-CM | POA: Diagnosis not present

## 2017-07-29 DIAGNOSIS — R634 Abnormal weight loss: Secondary | ICD-10-CM | POA: Diagnosis not present

## 2017-08-03 DIAGNOSIS — G241 Genetic torsion dystonia: Secondary | ICD-10-CM | POA: Diagnosis not present

## 2017-08-03 DIAGNOSIS — E559 Vitamin D deficiency, unspecified: Secondary | ICD-10-CM | POA: Diagnosis not present

## 2017-08-03 DIAGNOSIS — F329 Major depressive disorder, single episode, unspecified: Secondary | ICD-10-CM | POA: Diagnosis not present

## 2017-08-03 DIAGNOSIS — K219 Gastro-esophageal reflux disease without esophagitis: Secondary | ICD-10-CM | POA: Diagnosis not present

## 2017-08-03 DIAGNOSIS — E7800 Pure hypercholesterolemia, unspecified: Secondary | ICD-10-CM | POA: Diagnosis not present

## 2017-08-03 DIAGNOSIS — R296 Repeated falls: Secondary | ICD-10-CM | POA: Diagnosis not present

## 2017-08-05 DIAGNOSIS — E7800 Pure hypercholesterolemia, unspecified: Secondary | ICD-10-CM | POA: Diagnosis not present

## 2017-08-05 DIAGNOSIS — G241 Genetic torsion dystonia: Secondary | ICD-10-CM | POA: Diagnosis not present

## 2017-08-05 DIAGNOSIS — K219 Gastro-esophageal reflux disease without esophagitis: Secondary | ICD-10-CM | POA: Diagnosis not present

## 2017-08-05 DIAGNOSIS — R296 Repeated falls: Secondary | ICD-10-CM | POA: Diagnosis not present

## 2017-08-05 DIAGNOSIS — E559 Vitamin D deficiency, unspecified: Secondary | ICD-10-CM | POA: Diagnosis not present

## 2017-08-05 DIAGNOSIS — F329 Major depressive disorder, single episode, unspecified: Secondary | ICD-10-CM | POA: Diagnosis not present

## 2017-08-06 ENCOUNTER — Encounter: Payer: Self-pay | Admitting: Physical Therapy

## 2017-08-06 ENCOUNTER — Ambulatory Visit: Payer: Medicare Other | Attending: Family Medicine | Admitting: Physical Therapy

## 2017-08-06 DIAGNOSIS — H8113 Benign paroxysmal vertigo, bilateral: Secondary | ICD-10-CM | POA: Diagnosis not present

## 2017-08-06 DIAGNOSIS — R2681 Unsteadiness on feet: Secondary | ICD-10-CM | POA: Diagnosis not present

## 2017-08-06 DIAGNOSIS — M6281 Muscle weakness (generalized): Secondary | ICD-10-CM

## 2017-08-06 DIAGNOSIS — R29818 Other symptoms and signs involving the nervous system: Secondary | ICD-10-CM | POA: Diagnosis not present

## 2017-08-06 NOTE — Patient Instructions (Signed)
High Stepping in Place (Sitting)    Sitting, alternately lift knees as high as possible. Keep torso erect. Stomach tight.  Repeat for one minute, alternating legs. Add 10-15 seconds until you can do 3 minutes.    Copyright  VHI. All rights reserved.   Sit to Stand Transfers:  1. Scoot out to the edge of the chair 2. Place your feet flat on the floor, shoulder width apart.  Make sure your feet are tucked just under your knees. 3. Lean forward (nose over toes) with momentum, and stand up tall with your best posture.  If you need to use your arms, use them as a quick boost up to stand. 4. If you are in a low or soft chair, you can lean back and then forward up to stand, in order to get more momentum. 5. Once you are standing, make sure you are looking ahead and standing tall.  To sit down:  1. Back up until you feel the chair behind your legs. 2. Bend at you hips, reaching  Back for you chair, if needed, then slowly squat to sit down on your chair.   Functional Quadriceps: Sit to Stand    Sit on edge of chair, feet flat on floor. Stand upright, extending knees fully. Slowly lower back to sitting. Repeat __10__ times per set. Do __2__ sets per session.   http://orth.exer.us/735   Copyright  VHI. All rights reserved.   AMBULATION: Side Step    Holding onto your walker with chair behind your. Step right leg out to the side and then back in. Then step left leg out to the side and back in. Repeat x 10 each leg (or a total of 20).  __1_ sets per day  Copyright  VHI. All rights reserved.

## 2017-08-06 NOTE — Therapy (Signed)
[facility] [address] [place], [place], [address] Phone: [phone]   Fax:  [phone]  Physical Therapy Treatment  Patient Details  Name: Claudia Perez MRN: [id number] Date of Birth: [birth year] Referring Provider: Alger Simons MD  Encounter Date: 08/06/2017      PT End of Session - 08/06/17 1137    Visit Number 3   Number of Visits 17   Date for PT Re-Evaluation 09/24/17   Authorization Type Medicare, BCBS secondary   Authorization Time Period 07/26/17  to 09/24/17   PT Start Time 1026   PT Stop Time 1125   PT Time Calculation (min) 59 min   Equipment Utilized During Treatment --   Activity Tolerance Patient tolerated treatment well   Behavior During Therapy [facility] for tasks assessed/performed      Past Medical History:  Diagnosis Date  . Abnormality of gait   . Fall at home 03/27/2013   "lost consciousness; fractured right clavicle" (03/27/2013)  . GERD (gastroesophageal reflux disease)   . Hypercholesteremia   . Hyperthyroidism    "a little bit; don't take RX for it" (03/27/2013)  . Mobility impaired   . Other closed skull fracture without mention of intracranial injury, unspecified state of consciousness   . Seizures ([place])    "think I might have; been blacking out several times in the past" (03/27/2013)  . Shortness of breath    "even now, just talking" (03/27/2013)  . Subdural hemorrhage ([place])   . Vitamin D deficiency     Past Surgical History:  Procedure Laterality Date  . APPENDECTOMY  1960's  . BRAIN SURGERY  10/2003   SDH evac  . EYE SURGERY  02/2013   cataract extraction w/ intraocular lens implant    There were no vitals filed for this visit.      Subjective Assessment - 08/06/17 1031    Subjective Still feeling a little vertigo. Especially when goes sit to stand; lying to her right side; and turning her head right and left.    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 not too far   Patient Stated Goals be able to walk with her walker on her own; cure the vertigo   Currently in Pain? Yes   Pain Score 5    Pain Location Neck   Pain Orientation Right   Pain Descriptors / Indicators Sore   Pain Type Chronic pain   Pain Onset More than a month ago   Pain Frequency Intermittent                Vestibular Assessment - 08/06/17 1127      Vestibular Assessment   General Observation reports continues with brief episodes of spinning with side to sit, sit to stand and bending over and up                 [facility] Adult PT Treatment/Exercise - 08/06/17 1130      Transfers   Transfers Sit to Stand;Stand to Sit   Sit to Stand 4: Min guard   Stand to Sit 4: Min guard   Transfer Cueing vc for proper positioning to initiate stand and not press legs against surface     Ambulation/Gait   Ambulation/Gait Yes   Ambulation/Gait Assistance 4: Min guard   Ambulation/Gait Assistance Details guarding due  to continued very ataxic looking trunk movements    Ambulation Distance (Feet) 35 Feet  x2   Assistive device Rolling walker   Gait Pattern Step-through pattern;Decreased stride length;Decreased hip/knee flexion - right;Decreased hip/knee flexion - left;Decreased weight shift to right;Decreased weight shift to left;Shuffle;Poor foot clearance - left;Poor foot clearance - right   Ambulation Surface Level;Indoor   Gait velocity remains very slow with frequent stopping to regain balance     Exercises   Exercises Lumbar;Knee/Hip;Other Exercises   Other Exercises  pt reports she continues to do her neck stretching/AROM of neck     Knee/Hip Exercises: Standing   Hip Abduction AROM;Both;1 set;5 reps   Abduction Limitations pt unable to hold leg up off floor, therefore changed to step out and step in,  alternating sides     Knee/Hip Exercises: Seated   Marching Strengthening;Both;1 set;20 reps   Marching Limitations pt counts to 3 with each lift   Sit to Sand 10 reps;with UE support         Vestibular Treatment/Exercise - 08/06/17 1128      Vestibular Treatment/Exercise   Vestibular Treatment Provided Canalith Repositioning   Canalith Repositioning Epley Manuever Left;Semont Procedure Left Posterior      EPLEY MANUEVER LEFT   Number of Reps  1   Overall Response  Improved Symptoms    RESPONSE DETAILS LEFT no symptoms 1st two positions; +vertigo 3rd and 4th position (3rd worst)     Semont Procedure Left Posterior   Number of Reps  1   Overall Response Improved Symptoms   Response Details  no symptoms with lying down to her left, head turned to right; +symptoms when switched up and over to lie on right side with head turned to right; lasted ~30 seconds     Nestor Lewandowsky   Symptom Description  pt reports very difficult for her to do at home due to very limited shoulder ROM/strength bil                PT Education - 08/06/17 1136    Education provided Yes   Education Details see addition to HEP   Person(s) Educated Patient   Methods Explanation;Demonstration;Verbal cues;Handout   Comprehension Verbalized understanding;Returned demonstration          PT Short Term Goals - 07/26/17 1252      PT SHORT TERM GOAL #1   Title Patient will be asymptomatic with BPPV assessments.    Time 4   Period Weeks   Status New   Target Date 08/25/17     PT SHORT TERM GOAL #2   Title Patient will complete TUG and gait velocity with goals set (as appropriate).    Time 1   Period Weeks   Status New   Target Date 08/02/17     PT SHORT TERM GOAL #3   Title Patient will be independent with HEP for strengthening and balance.    Time 4   Status New   Target Date 08/25/17     PT SHORT TERM GOAL #4   Title Patient will ambulate 60 ft without standing rest break with RW and  supervision.    Time 4   Period Weeks   Status New   Target Date 08/25/17           PT Long Term Goals - 07/27/17 0556      PT LONG TERM GOAL #1   Title Patient will be independent with updated HEP    Time 8  Period Weeks   Status New   Target Date 09/25/17     PT LONG TERM GOAL #2   Title Patient will ambulate modified independent 100 ft with RW without standing rest break.   Time 8   Period Weeks   Status New   Target Date 09/25/17     PT LONG TERM GOAL #3   Title Patient's gait velocity will improve by 10% over measure at 4 weeks (as a measure of decreased fall risk).    Time 8   Period Weeks   Status New   Target Date 09/25/17     PT LONG TERM GOAL #4   Title Patient's Functional Status score on FOTO will improve to >30 as a measure of improved function and patient's confidence in her ability.   Time 8   Period Weeks   Status New   Target Date 09/25/17               Plan - 08/06/17 1138    Clinical Impression Statement Session focused on vertigo assessment and treatment. She reports her vertigo has been much better, but continues to have brief episodes with certain movements. Then focused on strengthening and gait training. Patient asking very appropriate questions re: her HEP (and she continues to do exercises from her previous bouts of therapy). Patient progressing towards goals.    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   PT Frequency 2x / week   PT Duration 8 weeks   PT Treatment/Interventions ADLs/Self Care Home Management;Canalith Repostioning;DME Instruction;Gait training;Stair training;Functional mobility training;Therapeutic activities;Therapeutic exercise;Balance training;Neuromuscular re-education;Patient/family education;Energy conservation;Vestibular   PT Next Visit Plan check for Lt pBPPV; add to HEP for strengthening (including core) and balance; pre-gait and gait  training for incr wt-shift, step length, heelstrike, etc   Consulted and Agree with Plan of Care Patient      Patient will benefit from skilled therapeutic intervention in order to improve the following deficits and impairments:  Abnormal gait, Decreased activity tolerance, Decreased balance, Decreased coordination, Decreased endurance, Decreased knowledge of use of DME, Decreased mobility, Decreased strength, Dizziness, Impaired UE functional use  Visit Diagnosis: BPPV (benign paroxysmal positional vertigo), bilateral  Unsteady gait  Muscle weakness (generalized)  Other symptoms and signs involving the nervous system     Problem List Patient Active Problem List   Diagnosis Date Noted  . Faintness   . Left rotator cuff tear 02/01/2015  . Closed left humeral fracture 12/07/2014  . Fatigue 12/07/2014  . 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 Dystonia 08/10/2014  . Syncope and collapse 06/08/2014  . Orthostatic hypotension 06/08/2014  . 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 Traumatic brain injury with loss of consciousness of 31 minutes to 59 minutes ([place]) 12/14/2013  . Fall down stairs 12/11/2013  . Basilar skull fracture ([place]) 12/11/2013  . Traumatic intracranial hemorrhage ([place]) 12/11/2013  . Back pain 12/11/2013  . Subarachnoid hemorrhage following injury ([place]) 12/09/2013  . Syncope 03/27/2013  . Clavicular fracture 03/27/2013  . COUGH 12/28/2007    Rexanne Mano , PT 08/06/2017, 11:42 AM  [facility] [address] [place], [place], [address] Phone: [phone]   Fax:  [phone]  Name: Runa Whittingham MRN: [id number] Date of Birth: [birth year]

## 2017-08-09 ENCOUNTER — Ambulatory Visit: Payer: Medicare Other | Admitting: Rehabilitation

## 2017-08-09 ENCOUNTER — Encounter: Payer: Self-pay | Admitting: Physical Therapy

## 2017-08-09 ENCOUNTER — Ambulatory Visit: Payer: Medicare Other | Admitting: Physical Therapy

## 2017-08-09 DIAGNOSIS — M6281 Muscle weakness (generalized): Secondary | ICD-10-CM | POA: Diagnosis not present

## 2017-08-09 DIAGNOSIS — H8113 Benign paroxysmal vertigo, bilateral: Secondary | ICD-10-CM | POA: Diagnosis not present

## 2017-08-09 DIAGNOSIS — R2681 Unsteadiness on feet: Secondary | ICD-10-CM | POA: Diagnosis not present

## 2017-08-09 DIAGNOSIS — R29818 Other symptoms and signs involving the nervous system: Secondary | ICD-10-CM | POA: Diagnosis not present

## 2017-08-09 NOTE — Patient Instructions (Signed)
Tip Card 1.The goal of habituation training is to assist in decreasing symptoms of vertigo, dizziness, or nausea provoked by specific head and body motions. 2.These exercises may initially increase symptoms; however, be persistent and work through symptoms. With repetition and time, the exercises will assist in reducing or eliminating symptoms. 3.Exercises should be stopped and discussed with the therapist if you experience any of the following: - Sudden change or fluctuation in hearing - New onset of ringing in the ears, or increase in current intensity - Any fluid discharge from the ear - Severe pain in neck or back - Extreme nausea  Copyright  VHI. All rights reserved.  Rolling   With pillow under head, start on back. Roll to your right side.  Hold until dizziness stops, plus 20 seconds and then roll to the left side.  Hold until dizziness stops, plus 20 seconds.  Repeat sequence 5 times per session. Do 2 sessions per day.   Plan to sit for about 15 minutes after doing these exercises.  Therefore, plan to do this prior to exercises:   1: Use the bathroom 2: Have phone nearby if needed 3: Have water, remote, anything you may want within reach (maybe on your night stand).

## 2017-08-09 NOTE — Therapy (Signed)
[facility] [address] [place], [place], [address] Phone: [phone]   Fax:  [phone]  Physical Therapy Treatment  Patient Details  Name: Snow Peoples MRN: [id number] Date of Birth: Jul 20, 1951 Referring Provider: Alger Simons MD  Encounter Date: 08/09/2017      PT End of Session - 08/09/17 1327    Visit Number 4   Number of Visits 17   Date for PT Re-Evaluation 09/24/17   Authorization Type Medicare, BCBS secondary   Authorization Time Period 07/26/17  to 09/24/17   PT Start Time 1319   PT Stop Time 1400   PT Time Calculation (min) 41 min   Activity Tolerance Patient tolerated treatment well   Behavior During Therapy [facility] for tasks assessed/performed      Past Medical History:  Diagnosis Date  . Abnormality of gait   . Fall at home 03/27/2013   "lost consciousness; fractured right clavicle" (03/27/2013)  . GERD (gastroesophageal reflux disease)   . Hypercholesteremia   . Hyperthyroidism    "a little bit; don't take RX for it" (03/27/2013)  . Mobility impaired   . Other closed skull fracture without mention of intracranial injury, unspecified state of consciousness   . Seizures ([place])    "think I might have; been blacking out several times in the past" (03/27/2013)  . Shortness of breath    "even now, just talking" (03/27/2013)  . Subdural hemorrhage ([place])   . Vitamin D deficiency     Past Surgical History:  Procedure Laterality Date  . APPENDECTOMY  1960's  . BRAIN SURGERY  10/2003   SDH evac  . EYE SURGERY  02/2013   cataract extraction w/ intraocular lens implant    There were no vitals filed for this visit.      Subjective Assessment - 08/09/17 1326    Subjective Pt reports still feeling dizziness at home this morning.    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 not too far    Patient Stated Goals be able to walk with her walker on her own; cure the vertigo   Currently in Pain? No/denies                         [facility] Adult PT Treatment/Exercise - 08/09/17 0001      Self-Care   Self-Care Other Self-Care Comments   Other Self-Care Comments  Education to pt and husband regarding Epley manuever done today x 2 reps, but feeling that she may also have BPPV in horizontal canal.  Did not want to undo Epley done today, therefore provided pt with rolling for habituation to begin in 2-3 days at home to begin self treatment until we can re-assess.  Pt and spouse verbalized understanding.       Exercises   Exercises Other Exercises   Other Exercises  Performed seated marching x 10 reps on each side with cues for posture to further engage core during exercise, seated LAQ x 10 reps on each side, again with cues for decreased trunk extension to allow more core activation during task.  Sit <>stand x 5 reps with cues for placing hands on lap vs RW when standing to decrease amount of UE support used.  Pt verbalized understanding.           Vestibular Treatment/Exercise - 08/09/17 0001  Vestibular Treatment/Exercise   Vestibular Treatment Provided Canalith Repositioning   Canalith Repositioning Epley Manuever Right      EPLEY MANUEVER RIGHT   Number of Reps  2   Overall Response Improved Symptoms   Response Details  Note that intensity of symptoms improved with second rep, however she still had slight dizziness with R Epley.                 PT Education - 08/09/17 1710    Education provided Yes   Education Details see self care   Person(s) Educated Patient;Spouse   Methods Explanation   Comprehension Verbalized understanding          PT Short Term Goals - 07/26/17 1252      PT SHORT TERM GOAL #1   Title Patient will be asymptomatic with BPPV assessments.    Time 4   Period Weeks   Status New   Target Date 08/25/17     PT SHORT  TERM GOAL #2   Title Patient will complete TUG and gait velocity with goals set (as appropriate).    Time 1   Period Weeks   Status New   Target Date 08/02/17     PT SHORT TERM GOAL #3   Title Patient will be independent with HEP for strengthening and balance.    Time 4   Status New   Target Date 08/25/17     PT SHORT TERM GOAL #4   Title Patient will ambulate 60 ft without standing rest break with RW and supervision.    Time 4   Period Weeks   Status New   Target Date 08/25/17           PT Long Term Goals - 07/27/17 0556      PT LONG TERM GOAL #1   Title Patient will be independent with updated HEP    Time 8   Period Weeks   Status New   Target Date 09/25/17     PT LONG TERM GOAL #2   Title Patient will ambulate modified independent 100 ft with RW without standing rest break.   Time 8   Period Weeks   Status New   Target Date 09/25/17     PT LONG TERM GOAL #3   Title Patient's gait velocity will improve by 10% over measure at 4 weeks (as a measure of decreased fall risk).    Time 8   Period Weeks   Status New   Target Date 09/25/17     PT LONG TERM GOAL #4   Title Patient's Functional Status score on FOTO will improve to >30 as a measure of improved function and patient's confidence in her ability.   Time 8   Period Weeks   Status New   Target Date 09/25/17               Plan - 08/09/17 1711    Clinical Impression Statement Session focused on re-assessment of vertigo and BPPV on L.  Note she did not have any dizziness or nystagmus in this direction, however was symptomatic and had slight rotary nystagmus during R Dix hallpike, therefore performed x 2 reps during session.  She still had slight dizziness during second round, however was improved from first rep.  Note that she reports dizziness with rolling also over the weekend.  Did not assess horizontal canal due to not wanting to undo R epley.  Provided rolling for habitution to begin in a few days  to begin self treatment until we can re-assess.  Pt and spouse verbalized understanding.    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   PT Frequency 2x / week   PT Duration 8 weeks   PT Treatment/Interventions ADLs/Self Care Home Management;Canalith Repostioning;DME Instruction;Gait training;Stair training;Functional mobility training;Therapeutic activities;Therapeutic exercise;Balance training;Neuromuscular re-education;Patient/family education;Energy conservation;Vestibular   PT Next Visit Plan check R and L dixhallpike, also check horizontal canal; add to HEP for strengthening (including core) and balance; pre-gait and gait training for incr wt-shift, step length, heelstrike, etc   Consulted and Agree with Plan of Care Patient      Patient will benefit from skilled therapeutic intervention in order to improve the following deficits and impairments:  Abnormal gait, Decreased activity tolerance, Decreased balance, Decreased coordination, Decreased endurance, Decreased knowledge of use of DME, Decreased mobility, Decreased strength, Dizziness, Impaired UE functional use  Visit Diagnosis: BPPV (benign paroxysmal positional vertigo), bilateral  Muscle weakness (generalized)     Problem List Patient Active Problem List   Diagnosis Date Noted  . Faintness   . Left rotator cuff tear 02/01/2015  . Closed left humeral fracture 12/07/2014  . Fatigue 12/07/2014  . 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 Dystonia 08/10/2014  . Syncope and collapse 06/08/2014  . Orthostatic hypotension 06/08/2014  . 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 Fall down stairs 12/11/2013  . Basilar skull fracture ([place]) 12/11/2013  . Traumatic intracranial hemorrhage ([place]) 12/11/2013   . Back pain 12/11/2013  . Subarachnoid hemorrhage following injury ([place]) 12/09/2013  . Syncope 03/27/2013  . Clavicular fracture 03/27/2013  . COUGH 12/28/2007    Cameron Sprang, PT, MPT [facility] [address] [place], [place], [address] Phone: [phone]   Fax:  [phone] 08/09/17, 5:17 PM  Name: Levette Paulick MRN: [id number] Date of Birth: Jun 30, 1951

## 2017-08-10 ENCOUNTER — Ambulatory Visit: Payer: Medicare Other | Admitting: Physical Therapy

## 2017-08-10 ENCOUNTER — Encounter: Payer: Self-pay | Admitting: Physical Therapy

## 2017-08-10 DIAGNOSIS — R2681 Unsteadiness on feet: Secondary | ICD-10-CM | POA: Diagnosis not present

## 2017-08-10 DIAGNOSIS — M6281 Muscle weakness (generalized): Secondary | ICD-10-CM

## 2017-08-10 DIAGNOSIS — R29818 Other symptoms and signs involving the nervous system: Secondary | ICD-10-CM | POA: Diagnosis not present

## 2017-08-10 DIAGNOSIS — H8113 Benign paroxysmal vertigo, bilateral: Secondary | ICD-10-CM

## 2017-08-10 NOTE — Therapy (Signed)
[facility] [address] [place], [place], [address] Phone: [phone]   Fax:  [phone]  Physical Therapy Treatment  Patient Details  Name: Claudia Perez MRN: [id number] Date of Birth: 12-13-1950 Referring Provider: Alger Simons MD  Encounter Date: 08/10/2017      PT End of Session - 08/10/17 1422    Visit Number 5   Number of Visits 17   Date for PT Re-Evaluation 09/24/17   Authorization Type Medicare, BCBS secondary   Authorization Time Period 07/26/17  to 09/24/17   PT Start Time 1317   PT Stop Time 1405   PT Time Calculation (min) 48 min   Equipment Utilized During Treatment Gait belt   Activity Tolerance Patient tolerated treatment well   Behavior During Therapy [facility] for tasks assessed/performed      Past Medical History:  Diagnosis Date  . Abnormality of gait   . Fall at home 03/27/2013   "lost consciousness; fractured right clavicle" (03/27/2013)  . GERD (gastroesophageal reflux disease)   . Hypercholesteremia   . Hyperthyroidism    "a little bit; don't take RX for it" (03/27/2013)  . Mobility impaired   . Other closed skull fracture without mention of intracranial injury, unspecified state of consciousness   . Seizures ([place])    "think I might have; been blacking out several times in the past" (03/27/2013)  . Shortness of breath    "even now, just talking" (03/27/2013)  . Subdural hemorrhage ([place])   . Vitamin D deficiency     Past Surgical History:  Procedure Laterality Date  . APPENDECTOMY  1960's  . BRAIN SURGERY  10/2003   SDH evac  . EYE SURGERY  02/2013   cataract extraction w/ intraocular lens implant    There were no vitals filed for this visit.      Subjective Assessment - 08/10/17 1323    Subjective Vertigo has been much less. Feels a bit when turning her head to her right. Yesterday was sitting still at home and felt like the house was moving (lasted < 1 minute).    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 not too far   Patient Stated Goals be able to walk with her walker on her own; cure the vertigo   Currently in Pain? Yes   Pain Score 5    Pain Location Neck   Pain Descriptors / Indicators Sore   Pain Type Chronic pain   Pain Frequency Intermittent                Vestibular Assessment - 08/10/17 1413      Vestibular Assessment   General Observation brief spells of spinning when turns her head to the right and when rolls in bed (rt and lt)     Symptom Behavior   Type of Dizziness Spinning   Frequency of Dizziness daily   Duration of Dizziness seconds   Aggravating Factors Turning head sideways;Rolling to right;Rolling to left   Relieving Factors Head stationary     Horizontal Canal Right   Horizontal Canal Right Duration 15 sec   Horizontal Canal Right Symptoms Other (comment)  spinning sensation; could not see nystagmus     Horizontal Canal Left   Horizontal Canal Left Duration 0   Horizontal Canal Left Symptoms Normal  [facility] Adult PT Treatment/Exercise - 08/10/17 0001      Transfers   Transfers Sit to Stand;Stand to Sit   Sit to Stand 4: Min guard;4: Min assist   Sit to Stand Details (indicate cue type and reason) level floor with minguard; feet on pillow with min assist for balance   Stand to Sit 4: Min guard   Number of Reps 10 reps;1 set   Transfer Cueing vc for hands on thighs as stands/sits     Balance   Balance Assessed Yes     Dynamic Standing Balance   Compliant surfaces comments: standing on pillow with mingurad to min assist; pt at times able to correct her imbalance, at most of the time required assist; x 10 reps 10-30 seconds     Exercises   Other Exercises  seated rolling back to lower her torso to a reclined position (~30-degrees from vertical) and then curl up to sitting  upright x 15 reps         Vestibular Treatment/Exercise - 08/10/17 0001      Vestibular Treatment/Exercise   Vestibular Treatment Provided Canalith Repositioning   Canalith Repositioning Appiani Left     Appiani Left   Number of Reps  1   Overall Response  Symptoms Resolved   Response Details no spinning during treatment, however upon completion she could turn her head to right without spinning               PT Education - 08/10/17 1422    Education provided Yes   Education Details addition to [employer]) Educated Patient   Methods Explanation;Demonstration;Tactile cues;Verbal cues;Handout   Comprehension Verbalized understanding;Returned demonstration;Need further instruction          PT Short Term Goals - 07/26/17 1252      PT SHORT TERM GOAL #1   Title Patient will be asymptomatic with BPPV assessments.    Time 4   Period Weeks   Status New   Target Date 08/25/17     PT SHORT TERM GOAL #2   Title Patient will complete TUG and gait velocity with goals set (as appropriate).    Time 1   Period Weeks   Status New   Target Date 08/02/17     PT SHORT TERM GOAL #3   Title Patient will be independent with HEP for strengthening and balance.    Time 4   Status New   Target Date 08/25/17     PT SHORT TERM GOAL #4   Title Patient will ambulate 60 ft without standing rest break with RW and supervision.    Time 4   Period Weeks   Status New   Target Date 08/25/17           PT Long Term Goals - 07/27/17 0556      PT LONG TERM GOAL #1   Title Patient will be independent with updated HEP    Time 8   Period Weeks   Status New   Target Date 09/25/17     PT LONG TERM GOAL #2   Title Patient will ambulate modified independent 100 ft with RW without standing rest break.   Time 8   Period Weeks   Status New   Target Date 09/25/17     PT LONG TERM GOAL #3   Title Patient's gait velocity will improve by 10% over measure at 4 weeks (as a measure of  decreased fall risk).    Time 8  Period Weeks   Status New   Target Date 09/25/17     PT LONG TERM GOAL #4   Title Patient's Functional Status score on FOTO will improve to >30 as a measure of improved function and patient's confidence in her ability.   Time 8   Period Weeks   Status New   Target Date 09/25/17               Plan - 08/10/17 1424    Clinical Impression Statement Session focused on assessment and treatment of BPPV (Rt horizontal canal), strength training, and balance training. Patient asking excellent questions and very engaged in her recovery. Pt making progress towards goals.    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   PT Frequency 2x / week   PT Duration 8 weeks   PT Treatment/Interventions ADLs/Self Care Home Management;Canalith Repostioning;DME Instruction;Gait training;Stair training;Functional mobility training;Therapeutic activities;Therapeutic exercise;Balance training;Neuromuscular re-education;Patient/family education;Energy conservation;Vestibular   PT Next Visit Plan if still with episodes of spinning, check HIT; if negative, check for Rt horiz canal and if + try BBQ roll; add to HEP for strengthening (including core) and balance; pre-gait and gait training for incr wt-shift, step length, heelstrike, etc   Consulted and Agree with Plan of Care Patient      Patient will benefit from skilled therapeutic intervention in order to improve the following deficits and impairments:  Abnormal gait, Decreased activity tolerance, Decreased balance, Decreased coordination, Decreased endurance, Decreased knowledge of use of DME, Decreased mobility, Decreased strength, Dizziness, Impaired UE functional use  Visit Diagnosis: BPPV (benign paroxysmal positional vertigo), bilateral  Muscle weakness (generalized)     Problem List Patient Active Problem List   Diagnosis Date Noted  .  Faintness   . Left rotator cuff tear 02/01/2015  . Closed left humeral fracture 12/07/2014  . Fatigue 12/07/2014  . 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 Dystonia 08/10/2014  . Syncope and collapse 06/08/2014  . Orthostatic hypotension 06/08/2014  . 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 Fall down stairs 12/11/2013  . Basilar skull fracture ([place]) 12/11/2013  . Traumatic intracranial hemorrhage ([place]) 12/11/2013  . Back pain 12/11/2013  . Subarachnoid hemorrhage following injury ([place]) 12/09/2013  . Syncope 03/27/2013  . Clavicular fracture 03/27/2013  . COUGH 12/28/2007    Rexanne Mano, PT 08/10/2017, 2:27 PM  [facility] [address] [place], [place], [address] Phone: [phone]   Fax:  [phone]  Name: Claudia Perez MRN: [id number] Date of Birth: 1951/06/08

## 2017-08-10 NOTE — Patient Instructions (Signed)
Modified sit-ups   Sit in your tall back chair with a pillow behind your back. Scoot forward so there is room between the back of your hips and the back of the chair. Tuck your chin (look down at your belly button), round your back, and slowly lower your body back to the chair. Your head should be the last thing to roll back/up. Then tuck your chin and use your stomach muscles to roll up to sit up straight (starting position). Repeat 10 times.

## 2017-08-17 ENCOUNTER — Ambulatory Visit: Payer: Medicare Other | Admitting: Physical Therapy

## 2017-08-17 DIAGNOSIS — M6281 Muscle weakness (generalized): Secondary | ICD-10-CM | POA: Diagnosis not present

## 2017-08-17 DIAGNOSIS — R29818 Other symptoms and signs involving the nervous system: Secondary | ICD-10-CM | POA: Diagnosis not present

## 2017-08-17 DIAGNOSIS — H8113 Benign paroxysmal vertigo, bilateral: Secondary | ICD-10-CM | POA: Diagnosis not present

## 2017-08-17 DIAGNOSIS — R2681 Unsteadiness on feet: Secondary | ICD-10-CM | POA: Diagnosis not present

## 2017-08-18 ENCOUNTER — Encounter: Payer: Self-pay | Admitting: Physical Medicine & Rehabilitation

## 2017-08-18 ENCOUNTER — Encounter: Payer: Medicare Other | Attending: Physical Medicine & Rehabilitation | Admitting: Physical Medicine & Rehabilitation

## 2017-08-18 VITALS — BP 104/54 | HR 60 | Resp 14

## 2017-08-18 DIAGNOSIS — E7800 Pure hypercholesterolemia, unspecified: Secondary | ICD-10-CM | POA: Insufficient documentation

## 2017-08-18 DIAGNOSIS — R2689 Other abnormalities of gait and mobility: Secondary | ICD-10-CM | POA: Insufficient documentation

## 2017-08-18 DIAGNOSIS — S0990XA Unspecified injury of head, initial encounter: Secondary | ICD-10-CM

## 2017-08-18 DIAGNOSIS — M84422A Pathological fracture, left humerus, initial encounter for fracture: Secondary | ICD-10-CM | POA: Diagnosis not present

## 2017-08-18 DIAGNOSIS — R5383 Other fatigue: Secondary | ICD-10-CM | POA: Insufficient documentation

## 2017-08-18 DIAGNOSIS — S0291XS Unspecified fracture of skull, sequela: Secondary | ICD-10-CM | POA: Diagnosis not present

## 2017-08-18 DIAGNOSIS — E0590 Thyrotoxicosis, unspecified without thyrotoxic crisis or storm: Secondary | ICD-10-CM | POA: Diagnosis not present

## 2017-08-18 DIAGNOSIS — K219 Gastro-esophageal reflux disease without esophagitis: Secondary | ICD-10-CM | POA: Insufficient documentation

## 2017-08-18 DIAGNOSIS — N3281 Overactive bladder: Secondary | ICD-10-CM | POA: Insufficient documentation

## 2017-08-18 DIAGNOSIS — E559 Vitamin D deficiency, unspecified: Secondary | ICD-10-CM | POA: Insufficient documentation

## 2017-08-18 DIAGNOSIS — S069X2S Unspecified intracranial injury with loss of consciousness of 31 minutes to 59 minutes, sequela: Secondary | ICD-10-CM | POA: Diagnosis not present

## 2017-08-18 DIAGNOSIS — F329 Major depressive disorder, single episode, unspecified: Secondary | ICD-10-CM | POA: Insufficient documentation

## 2017-08-18 DIAGNOSIS — F32A Depression, unspecified: Secondary | ICD-10-CM

## 2017-08-18 MED ORDER — MIRABEGRON ER 25 MG PO TB24: 25.0000 mg | ORAL_TABLET | Freq: Every day | ORAL | Status: DC

## 2017-08-18 MED ORDER — MIRABEGRON ER 25 MG PO TB24: 25.0000 mg | ORAL_TABLET | Freq: Every day | ORAL | 3 refills | Status: DC

## 2017-08-18 NOTE — Patient Instructions (Signed)
CONTINUE TO WORK ON YOUR BALANCE AND GOOD POSTURE. YOU'RE DOING GREAT!   PLEASE FEEL FREE TO CALL OUR OFFICE WITH ANY PROBLEMS OR QUESTIONS ([phone])

## 2017-08-18 NOTE — Progress Notes (Signed)
Subjective:    Patient ID: Claudia Perez, female    DOB: 1951-01-23, 66 y.o.   MRN: [id number]  HPI   Claudia Perez is here in follow up of her gait disorder. She has had good results with therapy at [facility]. She has found that the vestibular treatments in particular have been effective.   She has resumed her celexa 10mg  which she's taking in the morning. This has helped her energy and mood as well.   She is complaining of urinary urgency, and when she has to empty, she often can't make it to the bathroom in time. She interested in something to help relax her bladder.    Pain Inventory Average Pain 0 Pain Right Now 0 My pain is no pain  In the last 24 hours, has pain interfered with the following? General activity 0 Relation with others 0 Enjoyment of life 0 What TIME of day is your pain at its worst? no pain Sleep (in general) Fair  Pain is worse with: no pain Pain improves with: no pain Relief from Meds: no pain  Mobility walk with assistance use a walker ability to climb steps?  no do you drive?  no  Function retired  Neuro/Psych bladder control problems weakness tremor trouble walking  Prior Studies Any changes since last visit?  no  Physicians involved in your care Any changes since last visit?  no   Family History  Problem Relation Age of Onset  . Hypertension Mother   . Cirrhosis Brother    Social History   Social History  . Marital status: Married    Spouse name: N/A  . Number of children: N/A  . Years of education: N/A   Social History Main Topics  . Smoking status: Never Smoker  . Smokeless tobacco: Never Used  . Alcohol use Yes     Comment: 03/27/2013 glass of wine "couple times/yr"  . Drug use: No  . Sexual activity: Not Currently   Other Topics Concern  . None   Social History Narrative  . None   Past Surgical History:  Procedure Laterality Date  . APPENDECTOMY  1960's  . BRAIN SURGERY  10/2003   SDH evac  . EYE  SURGERY  02/2013   cataract extraction w/ intraocular lens implant   Past Medical History:  Diagnosis Date  . Abnormality of gait   . Fall at home 03/27/2013   "lost consciousness; fractured right clavicle" (03/27/2013)  . GERD (gastroesophageal reflux disease)   . Hypercholesteremia   . Hyperthyroidism    "a little bit; don't take RX for it" (03/27/2013)  . Mobility impaired   . Other closed skull fracture without mention of intracranial injury, unspecified state of consciousness   . Seizures ([place])    "think I might have; been blacking out several times in the past" (03/27/2013)  . Shortness of breath    "even now, just talking" (03/27/2013)  . Subdural hemorrhage ([place])   . Vitamin D deficiency    BP (!) 104/54 (BP Location: Right Arm, Patient Position: Sitting, Cuff Size: Normal)   Pulse 60   Resp 14   SpO2 97%   Opioid Risk Score:   Fall Risk Score:  `1  Depression screen PHQ 2/9  No flowsheet data found.  Review of Systems  HENT: Negative.   Eyes: Negative.   Respiratory: Negative.   Cardiovascular: Negative.   Gastrointestinal: Negative.   Endocrine: Negative.   Genitourinary: Positive for difficulty urinating.  Musculoskeletal: Positive for gait  problem.  Skin: Negative.   Allergic/Immunologic: Negative.   Neurological: Positive for weakness.  Hematological: Negative.   Psychiatric/Behavioral: Negative.        Objective:   Physical Exam  HENT:  Head: Normocephalic. No tenderness over frontal sinuses  Eyes: Conjunctivae are normal. Pupils are equal, round, and reactive to light.  Neck: Normal range of motion.  Cardiovascular: RRR.  Respiratory: normal effort GI: Soft. Bowel sounds are normal. She exhibits no distension. There is no tenderness.  Musculoskeletal: left shoulder atrophy/callus. Has more pain with abduction at 90 and above.  Neurological: She is alert and oriented to person, place, and time.  Speech fairly clear. Gait is wide-based, fairly  stable. Still weak in trunk. Takes her time.  Strength 4/5 with left arm and leg a half-grade weaker than the right. Standing balance improved stationary and with gait. She leans posteriorly when she changes directions and initially standing. No nystagmus seentoday.  Psychiatric: She is pleasant as always.       Assessment & Plan:  Assessment/Plan:  1. 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 Multifactorial gait disorder/syncope--largely related to chronic effects of her TBI              -bppV---continue HEP, therapy  -complete outpt therapies as advised  -pt being more aware re: safety at home.  3. Left humeral neck fracture- old  4. Continue celexa for depression  5. Fatigue/attention deficits:  -may be generally mood related. Has improved with resumption of celexa  6. Overactive bladder: trial of myrbetriq 25mg  daily  Fifteen minutes of face to face patient care time were spent during this visit. All questions were encouraged and answered.  Follow up in about 3 months.

## 2017-08-18 NOTE — Therapy (Signed)
[facility] [address] [place], [place], [address] Phone: [phone]   Fax:  [phone]  Physical Therapy Treatment  Patient Details  Name: Claudia Perez MRN: [id number] Date of Birth: [birth year] Referring Provider: Alger Simons MD  Encounter Date: 08/17/2017      PT End of Session - 08/18/17 1848    Visit Number 6   Number of Visits 17   Date for PT Re-Evaluation 09/24/17   Authorization Type Medicare, BCBS secondary   Authorization Time Period 07/26/17  to 09/24/17   PT Start Time 1329  pt arrived 14" late for appt   PT Stop Time 1405   PT Time Calculation (min) 36 min      Past Medical History:  Diagnosis Date  . Abnormality of gait   . Fall at home 03/27/2013   "lost consciousness; fractured right clavicle" (03/27/2013)  . GERD (gastroesophageal reflux disease)   . Hypercholesteremia   . Hyperthyroidism    "a little bit; don't take RX for it" (03/27/2013)  . Mobility impaired   . Other closed skull fracture without mention of intracranial injury, unspecified state of consciousness   . Seizures ([place])    "think I might have; been blacking out several times in the past" (03/27/2013)  . Shortness of breath    "even now, just talking" (03/27/2013)  . Subdural hemorrhage ([place])   . Vitamin D deficiency     Past Surgical History:  Procedure Laterality Date  . APPENDECTOMY  1960's  . BRAIN SURGERY  10/2003   SDH evac  . EYE SURGERY  02/2013   cataract extraction w/ intraocular lens implant    There were no vitals filed for this visit.      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 repetitive rolling due to pt c/o vertigo with Lt head turn with  questionable Rt horizontal nystagmus noted    Person(s) Educated Patient   Methods Explanation   Comprehension Verbalized understanding          PT Short Term Goals - 07/26/17 1252      PT SHORT TERM GOAL #1   Title Patient will be asymptomatic with BPPV assessments.    Time 4   Period Weeks   Status New   Target Date 08/25/17     PT SHORT TERM GOAL #2   Title Patient will complete TUG and gait velocity with goals set (as appropriate).    Time 1   Period Weeks   Status New   Target Date 08/02/17     PT SHORT TERM GOAL #3   Title Patient will be independent with HEP for strengthening and balance.    Time 4   Status New   Target Date 08/25/17     PT SHORT TERM GOAL #4   Title Patient will ambulate 60 ft without standing rest break with RW and supervision.     Time 4   Period Weeks   Status New   Target Date 08/25/17           PT Long Term Goals - 07/27/17 0556      PT LONG TERM GOAL #1   Title Patient will be independent with updated HEP    Time 8   Period Weeks   Status New   Target Date 09/25/17     PT LONG TERM GOAL #2   Title Patient will ambulate modified independent 100 ft with RW without standing rest break.   Time 8   Period Weeks   Status New   Target Date 09/25/17     PT LONG TERM GOAL #3   Title Patient's gait velocity will improve by 10% over measure at 4 weeks (as a measure of decreased fall risk).    Time 8   Period Weeks   Status New   Target Date 09/25/17     PT LONG TERM GOAL #4   Title Patient's Functional Status score on FOTO will improve to >30 as a measure of improved function and patient's confidence in her ability.   Time 8   Period Weeks   Status New   Target Date 09/25/17               Plan - 08/18/17 1849    Clinical Impression Statement Limited time in today's PT session due to pt arriving late; session focused on positional testing to try to discern continued BPPV:  no significant nystagmus noted except on 1 occasion with Lt horizontal roll test with questionable Rt horizontal nystagmus noted, indicative of Rt horizontal cupulolithiasis?  Pt had difficulty performing gaze stabilization with inability to keep eyes focused on target with Rt head turn   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   PT Frequency 2x / week   PT Duration 8 weeks   PT Treatment/Interventions ADLs/Self Care Home Management;Canalith Repostioning;DME Instruction;Gait training;Stair training;Functional mobility training;Therapeutic activities;Therapeutic exercise;Balance training;Neuromuscular re-education;Patient/family education;Energy conservation;Vestibular   PT Next Visit Plan if still with episodes of spinning, check HIT; add to HEP for  strengthening (including core) and balance; pre-gait and gait training for incr wt-shift, step length, heelstrike, etc   Consulted and Agree with Plan of Care Patient      Patient will benefit from skilled therapeutic intervention in order to improve the following deficits  and impairments:  Abnormal gait, Decreased activity tolerance, Decreased balance, Decreased coordination, Decreased endurance, Decreased knowledge of use of DME, Decreased mobility, Decreased strength, Dizziness, Impaired UE functional use  Visit Diagnosis: BPPV (benign paroxysmal positional vertigo), bilateral     Problem List Patient Active Problem List   Diagnosis Date Noted  . Overactive bladder 08/18/2017  . Faintness   . Left rotator cuff tear 02/01/2015  . Closed left humeral fracture 12/07/2014  . Fatigue 12/07/2014  . 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 Dystonia 08/10/2014  . Syncope and collapse 06/08/2014  . Orthostatic hypotension 06/08/2014  . 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 Fall down stairs 12/11/2013  . Basilar skull fracture ([place]) 12/11/2013  . Traumatic intracranial hemorrhage ([place]) 12/11/2013  . Back pain 12/11/2013  . Subarachnoid hemorrhage following injury ([place]) 12/09/2013  . Syncope 03/27/2013  . Clavicular fracture 03/27/2013  . COUGH 12/28/2007    Alda Lea, PT 08/18/2017, 6:55 PM  [facility] [address] [place], [place], [address] Phone: [phone]   Fax:  [phone]  Name: Bristyl Mclees MRN: [id number] Date of Birth: 12-12-1950

## 2017-08-20 ENCOUNTER — Ambulatory Visit: Payer: Medicare Other | Admitting: Physical Therapy

## 2017-08-20 ENCOUNTER — Encounter: Payer: Self-pay | Admitting: Physical Therapy

## 2017-08-20 DIAGNOSIS — M6281 Muscle weakness (generalized): Secondary | ICD-10-CM | POA: Diagnosis not present

## 2017-08-20 DIAGNOSIS — R2681 Unsteadiness on feet: Secondary | ICD-10-CM

## 2017-08-20 DIAGNOSIS — R29818 Other symptoms and signs involving the nervous system: Secondary | ICD-10-CM

## 2017-08-20 DIAGNOSIS — H8113 Benign paroxysmal vertigo, bilateral: Secondary | ICD-10-CM | POA: Diagnosis not present

## 2017-08-20 NOTE — Patient Instructions (Signed)
   Gaze Stabilization: Tip Card  1.Target must remain in focus, not blurry, and appear stationary while head is in motion. 2.Perform exercises with small head movements (45 to either side of midline). 3.Increase speed of head motion so long as target is in focus. 4.If you wear eyeglasses, be sure you can see target through lens (therapist will give specific instructions for bifocal / progressive lenses). 5.These exercises may provoke dizziness or nausea. Work through these symptoms. If too dizzy, slow head movement slightly. Rest between each exercise. 6.Exercises demand concentration; avoid distractions.    Special Instructions: Exercises may bring on mild to moderate symptoms of vertigo that resolve within 30 minutes of completing exercises. If symptoms are lasting longer than 30 minutes, modify your exercises by:  >decreasing the # of times you complete each activity >ensuring your symptoms return to baseline before moving onto the next exercise >dividing up exercises so you do not do them all in one session, but multiple short sessions throughout the day >doing them once a day until symptoms improve       Keeping eyes on target on wall 2-3 feet away, and move head side to side for _30___ seconds. Rest and repeat for a second 30 seconds. Then move head up and down for __30__ seconds. Rest and repeat for another 30 seconds.  Do __3__ sessions per day.  Copyright  VHI. All rights reserved.

## 2017-08-21 NOTE — Therapy (Signed)
[facility] [address] [place], [place], [address] Phone: [phone]   Fax:  [phone]  Physical Therapy Treatment  Patient Details  Name: Claudia Perez MRN: [id number] Date of Birth: 1950/12/16 Referring Provider: Alger Simons MD  Encounter Date: 08/20/2017      PT End of Session - 08/21/17 0733    Visit Number 7   Number of Visits 17   Date for PT Re-Evaluation 09/24/17   Authorization Type Medicare, BCBS secondary   Authorization Time Period 07/26/17  to 09/24/17   PT Start Time 1114  pt arrived late   PT Stop Time 1145   PT Time Calculation (min) 31 min   Activity Tolerance Patient tolerated treatment well   Behavior During Therapy [facility] for tasks assessed/performed      Past Medical History:  Diagnosis Date  . Abnormality of gait   . Fall at home 03/27/2013   "lost consciousness; fractured right clavicle" (03/27/2013)  . GERD (gastroesophageal reflux disease)   . Hypercholesteremia   . Hyperthyroidism    "a little bit; don't take RX for it" (03/27/2013)  . Mobility impaired   . Other closed skull fracture without mention of intracranial injury, unspecified state of consciousness   . Seizures ([place])    "think I might have; been blacking out several times in the past" (03/27/2013)  . Shortness of breath    "even now, just talking" (03/27/2013)  . Subdural hemorrhage ([place])   . Vitamin D deficiency     Past Surgical History:  Procedure Laterality Date  . APPENDECTOMY  1960's  . BRAIN SURGERY  10/2003   SDH evac  . EYE SURGERY  02/2013   cataract extraction w/ intraocular lens implant    There were no vitals filed for this visit.      Subjective Assessment - 08/20/17 1148    Subjective Arrived late for appt. Reports she continues to feel better. Only has vertigo/spinning when she turns her head to the right and for only a few seconds.   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 No/denies                         [facility] Adult PT Treatment/Exercise - 08/21/17 0001      Transfers   Transfers Sit to Stand;Stand to Sit   Sit to Stand 4: Min guard   Sit to Stand Details (indicate cue type and reason) vc to not use UEs as coming to stand or return to sit    Stand to Sit 4: Min guard   Number of Reps 10 reps   Comments close guard due to incr sway/imbalance     Ambulation/Gait   Ambulation/Gait Assistance 4: Min guard   Ambulation/Gait Assistance Details remains ataxic with tendency for posterior lean/wobble; she independently maintains balance (with use of RW)   Ambulation Distance (Feet) 100 Feet  x 2   Assistive device Rolling walker   Gait Pattern Step-through pattern;Decreased stride length;Decreased hip/knee flexion - right;Decreased hip/knee flexion - left;Decreased weight shift to right;Decreased weight shift to left;Shuffle;Poor foot clearance - left;Poor foot clearance - right;Ataxic   Ambulation Surface Level  Vestibular Treatment/Exercise - 08/21/17 0001      Vestibular Treatment/Exercise   Vestibular Treatment Provided Gaze   Gaze Exercises X1 Viewing Horizontal;X1 Viewing Vertical     X1 Viewing Horizontal   Foot Position seated   Time --  15-30 sec   Reps 5   Comments required slow head movements and less visual distractions to keep eyes on target; accuracy improved with repetitions     X1 Viewing Vertical   Foot Position seated   Time --  20 sec   Reps 2   Comments better accuracy than horizontal               PT Education - 08/21/17 0732    Education provided Yes   Education Details see addition to HEP; ;purp0se of VOR x1    Person(s) Educated Patient   Methods  Explanation;Demonstration;Tactile cues;Verbal cues;Handout   Comprehension Verbalized understanding;Returned demonstration;Need further instruction          PT Short Term Goals - 07/26/17 1252      PT SHORT TERM GOAL #1   Title Patient will be asymptomatic with BPPV assessments.    Time 4   Period Weeks   Status New   Target Date 08/25/17     PT SHORT TERM GOAL #2   Title Patient will complete TUG and gait velocity with goals set (as appropriate).    Time 1   Period Weeks   Status New   Target Date 08/02/17     PT SHORT TERM GOAL #3   Title Patient will be independent with HEP for strengthening and balance.    Time 4   Status New   Target Date 08/25/17     PT SHORT TERM GOAL #4   Title Patient will ambulate 60 ft without standing rest break with RW and supervision.    Time 4   Period Weeks   Status New   Target Date 08/25/17           PT Long Term Goals - 07/27/17 0556      PT LONG TERM GOAL #1   Title Patient will be independent with updated HEP    Time 8   Period Weeks   Status New   Target Date 09/25/17     PT LONG TERM GOAL #2   Title Patient will ambulate modified independent 100 ft with RW without standing rest break.   Time 8   Period Weeks   Status New   Target Date 09/25/17     PT LONG TERM GOAL #3   Title Patient's gait velocity will improve by 10% over measure at 4 weeks (as a measure of decreased fall risk).    Time 8   Period Weeks   Status New   Target Date 09/25/17     PT LONG TERM GOAL #4   Title Patient's Functional Status score on FOTO will improve to >30 as a measure of improved function and patient's confidence in her ability.   Time 8   Period Weeks   Status New   Target Date 09/25/17               Plan - 08/21/17 0735    Clinical Impression Statement Limited session due to pt's late arrival. Focused on continued reports of spinning sensation. Instructed in VOR x 1 with pt having increased difficulty keeping her  eyes on target with horizontal head turns. Will continue to benefit from PT to work towards goals.  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   PT Frequency 2x / week   PT Duration 8 weeks   PT Treatment/Interventions ADLs/Self Care Home Management;Canalith Repostioning;DME Instruction;Gait training;Stair training;Functional mobility training;Therapeutic activities;Therapeutic exercise;Balance training;Neuromuscular re-education;Patient/family education;Energy conservation;Vestibular   PT Next Visit Plan STGs due 9/26; check VOR x1 (added to HEP 9/21); add to HEP for strengthening (including core) and balance; pre-gait and gait training for incr wt-shift, step length, heelstrike, etc   Consulted and Agree with Plan of Care Patient      Patient will benefit from skilled therapeutic intervention in order to improve the following deficits and impairments:  Abnormal gait, Decreased activity tolerance, Decreased balance, Decreased coordination, Decreased endurance, Decreased knowledge of use of DME, Decreased mobility, Decreased strength, Dizziness, Impaired UE functional use  Visit Diagnosis: Other symptoms and signs involving the nervous system  Unsteady gait  Muscle weakness (generalized)     Problem List Patient Active Problem List   Diagnosis Date Noted  . Overactive bladder 08/18/2017  . Faintness   . Left rotator cuff tear 02/01/2015  . Closed left humeral fracture 12/07/2014  . Fatigue 12/07/2014  . 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 Dystonia 08/10/2014  . Syncope and collapse 06/08/2014  . Orthostatic hypotension 06/08/2014  . 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 Fall down stairs 12/11/2013  . Basilar skull fracture ([place]) 12/11/2013   . Traumatic intracranial hemorrhage ([place]) 12/11/2013  . Back pain 12/11/2013  . Subarachnoid hemorrhage following injury ([place]) 12/09/2013  . Syncope 03/27/2013  . Clavicular fracture 03/27/2013  . COUGH 12/28/2007    Rexanne Mano, PT 08/21/2017, 7:41 AM  [facility] [address] [place], [place], [address] Phone: [phone]   Fax:  [phone]  Name: Claudia Perez MRN: [id number] Date of Birth: 1951/09/23

## 2017-08-23 ENCOUNTER — Ambulatory Visit: Payer: Medicare Other | Admitting: Physical Therapy

## 2017-08-23 ENCOUNTER — Encounter: Payer: Self-pay | Admitting: Physical Therapy

## 2017-08-23 DIAGNOSIS — M6281 Muscle weakness (generalized): Secondary | ICD-10-CM | POA: Diagnosis not present

## 2017-08-23 DIAGNOSIS — H8113 Benign paroxysmal vertigo, bilateral: Secondary | ICD-10-CM | POA: Diagnosis not present

## 2017-08-23 DIAGNOSIS — R29818 Other symptoms and signs involving the nervous system: Secondary | ICD-10-CM | POA: Diagnosis not present

## 2017-08-23 DIAGNOSIS — R2681 Unsteadiness on feet: Secondary | ICD-10-CM | POA: Diagnosis not present

## 2017-08-24 NOTE — Therapy (Signed)
[facility] [address] [place], [place], [address] Phone: [phone]   Fax:  [phone]  Physical Therapy Treatment  Patient Details  Name: Claudia Perez MRN: [id number] Date of Birth: 02-28-1951 Referring Provider: Alger Simons MD  Encounter Date: 08/23/2017      PT End of Session - 08/23/17 2115    Visit Number 8   Number of Visits 17   Date for PT Re-Evaluation 09/24/17   Authorization Type Medicare, BCBS secondary   Authorization Time Period 07/26/17  to 09/24/17   PT Start Time 1118   PT Stop Time 1144   PT Time Calculation (min) 26 min   Activity Tolerance Patient tolerated treatment well   Behavior During Therapy [facility] for tasks assessed/performed      Past Medical History:  Diagnosis Date  . Abnormality of gait   . Fall at home 03/27/2013   "lost consciousness; fractured right clavicle" (03/27/2013)  . GERD (gastroesophageal reflux disease)   . Hypercholesteremia   . Hyperthyroidism    "a little bit; don't take RX for it" (03/27/2013)  . Mobility impaired   . Other closed skull fracture without mention of intracranial injury, unspecified state of consciousness   . Seizures ([place])    "think I might have; been blacking out several times in the past" (03/27/2013)  . Shortness of breath    "even now, just talking" (03/27/2013)  . Subdural hemorrhage ([place])   . Vitamin D deficiency     Past Surgical History:  Procedure Laterality Date  . APPENDECTOMY  1960's  . BRAIN SURGERY  10/2003   SDH evac  . EYE SURGERY  02/2013   cataract extraction w/ intraocular lens implant    There were no vitals filed for this visit.      Subjective Assessment - 08/23/17 1122    Subjective Arrived late for appt. Reports spinning dizziness when doing rolling HEP exercise on Sat and Sun. did not try VOR x 1 at home.  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 No/denies                Vestibular Assessment - 08/23/17 1132      Vestibular Assessment   General Observation brief spells of spinning when and when rolls in bed (rt and lt)     Symptom Behavior   Type of Dizziness Spinning   Frequency of Dizziness daily   Duration of Dizziness seconds   Aggravating Factors Lying supine;Supine to sit;Rolling to right   Relieving Factors Head stationary;Closing eyes     Horizontal Canal Right   Horizontal Canal Right Duration 1   Horizontal Canal Right Symptoms Geotrophic     Horizontal Canal Left   Horizontal Canal Left Duration 5   Horizontal Canal Left Symptoms Geotrophic                 [facility] Adult PT Treatment/Exercise - 08/23/17 0001      Bed Mobility   Bed Mobility Rolling Right;Right Sidelying to Sit;Supine to Sit   Rolling Right 4: Min assist   Right Sidelying to Sit 2: Max assist   Right Sidelying to Sit Details (indicate cue type and reason) due to limited shoulder use  Sit to Sidelying Right 2: Max assist   Sit to Sidelying Right Details (indicate cue type and reason) due to limited shoulders and difficulty raising legs     Transfers   Transfers Sit to Stand;Stand to Sit   Sit to Stand 4: Min guard   Sit to Stand Details (indicate cue type and reason) vc to not use UEs as coming to stand or return to sit    Stand to Sit 4: Min guard   Number of Reps 10 reps;1 set     Ambulation/Gait   Ambulation/Gait Assistance 4: Min guard   Ambulation/Gait Assistance Details standing rest x 3 over 60 ft;    Ambulation Distance (Feet) 80 Feet  80   Assistive device Rolling walker   Gait Pattern Step-through pattern   Ambulation Surface Level;Indoor         Vestibular Treatment/Exercise - 08/23/17 2055      Vestibular  Treatment/Exercise   Vestibular Treatment Provided Canalith Repositioning   Canalith Repositioning Epley Manuever Left;Appiani Right      EPLEY MANUEVER LEFT   Number of Reps  1   Overall Response  Improved Symptoms    RESPONSE DETAILS LEFT vertigo at all stages of Epley maneuver     Appiani Right   Number of Reps  1   Overall Response  Improved Symptoms   Response Details  few seconds of spinning               PT Education - 08/23/17 2105    Education provided Yes   Education Details importance of attending on time; importance of doing VOR x 1   Person(s) Educated Patient   Methods Explanation   Comprehension Verbalized understanding          PT Short Term Goals - 08/24/17 7829      PT SHORT TERM GOAL #1   Title Patient will be asymptomatic with BPPV assessments.    Baseline 9/24 +for BPPV lt posterior canal and Lt horizontal canal   Time 4   Period Weeks   Status On-going     PT SHORT TERM GOAL #2   Title Patient will complete TUG and gait velocity with goals set (as appropriate).    Time 1   Period Weeks   Status New     PT SHORT TERM GOAL #3   Title Patient will be independent with HEP for strengthening and balance.    Time 4   Status New     PT SHORT TERM GOAL #4   Title Patient will ambulate 60 ft without standing rest break with RW and supervision.    Baseline 9/24 stopped 3 times in 60 ft, each time due to imbalance andd stops to recover her balance (not due to fatigue)   Time 4   Period Weeks   Status Partially Met           PT Long Term Goals - 07/27/17 0556      PT LONG TERM GOAL #1   Title Patient will be independent with updated HEP    Time 8   Period Weeks   Status New   Target Date 09/25/17     PT LONG TERM GOAL #2   Title Patient will ambulate modified independent 100 ft with RW without standing rest break.   Time 8   Period Weeks   Status New   Target Date 09/25/17     PT LONG TERM GOAL #3   Title Patient's gait  velocity will improve by 10% over measure at 4 weeks (as a measure of decreased fall risk).    Time 8   Period Weeks   Status New   Target Date 09/25/17     PT LONG TERM GOAL #4   Title Patient's Functional Status score on FOTO will improve to >30 as a measure of improved function and patient's confidence in her ability.   Time 8   Period Weeks   Status New   Target Date 09/25/17               Plan - 08/24/17 0254    Clinical Impression Statement Again limited session due to pt's late arrival. Pt verbalizes understanding of importance of being on time. Began to assess STGs and pt continues to show signs of BPPV. Focused again on vertigo as pt reports it had worsened over the weekend. Progress with balance and gait has been limited by continued vertigo and shortened sessions due to arriving late. Emphasized need to continue HEP and arrive on time with hopeful result to make progress towards goals.    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   PT Frequency 2x / week   PT Duration 8 weeks   PT Treatment/Interventions ADLs/Self Care Home Management;Canalith Repostioning;DME Instruction;Gait training;Stair training;Functional mobility training;Therapeutic activities;Therapeutic exercise;Balance training;Neuromuscular re-education;Patient/family education;Energy conservation;Vestibular   PT Next Visit Plan check STGS; check VOR x1 (added to HEP 9/21); add to HEP for strengthening (including core) and balance; pre-gait and gait training for incr wt-shift, step length, heelstrike, etc   Consulted and Agree with Plan of Care Patient      Patient will benefit from skilled therapeutic intervention in order to improve the following deficits and impairments:  Abnormal gait, Decreased activity tolerance, Decreased balance, Decreased coordination, Decreased endurance, Decreased knowledge of use of DME, Decreased mobility,  Decreased strength, Dizziness, Impaired UE functional use  Visit Diagnosis: Other symptoms and signs involving the nervous system  Unsteady gait  BPPV (benign paroxysmal positional vertigo), bilateral     Problem List Patient Active Problem List   Diagnosis Date Noted  . Overactive bladder 08/18/2017  . Faintness   . Left rotator cuff tear 02/01/2015  . Closed left humeral fracture 12/07/2014  . Fatigue 12/07/2014  . 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 Dystonia 08/10/2014  . Syncope and collapse 06/08/2014  . Orthostatic hypotension 06/08/2014  . 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 Fall down stairs 12/11/2013  . Basilar skull fracture ([place]) 12/11/2013  . Traumatic intracranial hemorrhage ([place]) 12/11/2013  . Back pain 12/11/2013  . Subarachnoid hemorrhage following injury ([place]) 12/09/2013  . Syncope 03/27/2013  . Clavicular fracture 03/27/2013  . COUGH 12/28/2007    Rexanne Mano, PT 08/24/2017, 6:40 AM  [facility] [address] [place], [place], [address] Phone: [phone]   Fax:  [phone]  Name: Claudia Perez MRN: [id number] Date of Birth: [birth year]

## 2017-08-26 ENCOUNTER — Ambulatory Visit: Payer: Medicare Other | Admitting: Physical Therapy

## 2017-08-26 ENCOUNTER — Encounter: Payer: Self-pay | Admitting: Physical Therapy

## 2017-08-26 DIAGNOSIS — M6281 Muscle weakness (generalized): Secondary | ICD-10-CM | POA: Diagnosis not present

## 2017-08-26 DIAGNOSIS — H8113 Benign paroxysmal vertigo, bilateral: Secondary | ICD-10-CM | POA: Diagnosis not present

## 2017-08-26 DIAGNOSIS — R29818 Other symptoms and signs involving the nervous system: Secondary | ICD-10-CM

## 2017-08-26 DIAGNOSIS — R2681 Unsteadiness on feet: Secondary | ICD-10-CM

## 2017-08-26 NOTE — Patient Instructions (Signed)
Bending / Picking Up Objects    Sitting, slowly bend head down and reach to floor as if your were going to pick up an object on the floor. Return to upright position. Hold position until symptoms subside. Then repeat. Repeat __5-10__ times per session. Do __1__ sessions per day.  Copyright  VHI. All rights reserved.

## 2017-08-26 NOTE — Therapy (Signed)
[facility] [address] [place], [place], [address] Phone: [phone]   Fax:  [phone]  Physical Therapy Treatment  Patient Details  Name: Claudia Perez MRN: [id number] Date of Birth: 04/30/1951 Referring Provider: Alger Simons MD  Encounter Date: 08/26/2017      PT End of Session - 08/26/17 1205    Visit Number 9   Number of Visits 17   Date for PT Re-Evaluation 09/24/17   Authorization Type Medicare, BCBS secondary   Authorization Time Period 07/26/17  to 09/24/17   PT Start Time 1102   PT Stop Time 1146   PT Time Calculation (min) 44 min   Equipment Utilized During Treatment Gait belt   Activity Tolerance Patient tolerated treatment well   Behavior During Therapy [facility] for tasks assessed/performed      Past Medical History:  Diagnosis Date  . Abnormality of gait   . Fall at home 03/27/2013   "lost consciousness; fractured right clavicle" (03/27/2013)  . GERD (gastroesophageal reflux disease)   . Hypercholesteremia   . Hyperthyroidism    "a little bit; don't take RX for it" (03/27/2013)  . Mobility impaired   . Other closed skull fracture without mention of intracranial injury, unspecified state of consciousness   . Seizures ([place])    "think I might have; been blacking out several times in the past" (03/27/2013)  . Shortness of breath    "even now, just talking" (03/27/2013)  . Subdural hemorrhage ([place])   . Vitamin D deficiency     Past Surgical History:  Procedure Laterality Date  . APPENDECTOMY  1960's  . BRAIN SURGERY  10/2003   SDH evac  . EYE SURGERY  02/2013   cataract extraction w/ intraocular lens implant    There were no vitals filed for this visit.      Subjective Assessment - 08/26/17 1101    Subjective Reports she worked on all her exercises yesterday. Reports her dizziness has improved "some. 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, vertigo,    Patient Stated Goals be able to walk with her walker on her own; cure the vertigo                         [facility] Adult PT Treatment/Exercise - 08/26/17 1120      Bed Mobility   Bed Mobility Rolling Right;Rolling Left;Right Sidelying to Sit;Sit to Sidelying Right   Rolling Right 4: Min guard   Rolling Left 5: Supervision   Right Sidelying to Sit 2: Max assist   Sit to Sidelying Right 3: Mod assist     Transfers   Transfers Sit to Stand;Stand to Sit   Sit to Stand 5: Supervision   Sit to Stand Details (indicate cue type and reason) for safety; cue for not holding RW only once during session   Stand to Sit 5: Supervision   Stand to Sit Details for safety due to poor postural control; no LOB     Ambulation/Gait   Ambulation/Gait Assistance 4: Min guard;4: Min assist   Ambulation/Gait Assistance Details attempted 60 ft with no standing rest breaks and pt able to ambulate 30 ft and caught her rt toe and lost balance requiring min assist; noted as she attempted consecutive steps without stopping, she got faster and faster with poor control with incr LOB; noted she stops more to  maintain balance than due to fatigue   Ambulation Distance (Feet) 120 Feet  30, 120   Assistive device Rolling walker   Gait Pattern Step-through pattern;Step-to pattern;Decreased stride length;Decreased hip/knee flexion - right;Right foot flat;Left foot flat;Shuffle;Ataxic;Wide base of support;Poor foot clearance - left;Poor foot clearance - right   Ambulation Surface Level     Posture/Postural Control   Posture/Postural Control Postural limitations   Postural Limitations --  ataxic torso; able to stabilize with abdominals to reduce     Lumbar Exercises: Supine   Bridge 10 reps;5 seconds   Bridge Limitations attempted bridge wiht leg extensionn but pt could not control torso; tried single leg bridge with improved control, but still very  wobbly         Vestibular Treatment/Exercise - 08/26/17 0001      Vestibular Treatment/Exercise   Habituation Exercises Horizontal Roll  seated forward bend and up to sitting     Horizontal Roll   Number of Reps  2   Symptom Description  very mild symptoms from left to right; forward bend x 5 reps with no symptoms elicited--given as part of HEP            Balance Exercises - 08/26/17 1201      Balance Exercises: Standing   Standing Eyes Opened Narrow base of support (BOS);Solid surface;20 secs;Head turns  internittent UE support in // bars; feet together, tandem   Standing Eyes Closed Narrow base of support (BOS);Solid surface;3 reps;10 secs;20 secs  internittent UE support in // bars; feet together, tandem   Step Ups 4 inch;UE support 2;Forward  RLE to ascend and descend for strengthening   Retro Gait 4 reps;Upper extremity support  very short step length   Sidestepping Upper extremity support;4 reps   Turning Right;Left;5 reps  each direction, 180 in // bars           PT Education - 08/26/17 1204    Education provided Yes   Education Details rationale for habituation exercises; addition to HEP   Person(s) Educated Patient   Methods Explanation;Demonstration;Tactile cues;Verbal cues;Handout   Comprehension Verbalized understanding;Returned demonstration;Verbal cues required;Tactile cues required;Need further instruction          PT Short Term Goals - 08/26/17 1209      PT SHORT TERM GOAL #1   Title Patient will be asymptomatic with BPPV assessments.    Baseline 9/24 +for BPPV lt posterior canal and Lt horizontal canal; 9/26 roll test no nystagmus    Time 4   Period Weeks   Status Achieved     PT SHORT TERM GOAL #2   Title Patient will complete TUG and gait velocity with goals set (as appropriate).    Time 1   Period Weeks   Status Not Met     PT SHORT TERM GOAL #3   Title Patient will be independent with HEP for strengthening and balance.     Baseline 9/27 independent with HEP, however primarily addressing vestibular rehab and balance. As this improves, will incorporate more strengthening exercises   Time 4   Status Partially Met     PT SHORT TERM GOAL #4   Title Patient will ambulate 60 ft without standing rest break with RW and supervision.    Baseline 9/24 stopped 3 times in 60 ft, each time due to imbalance andd stops to recover her balance (not due to fatigue); 9/27 again stops while walkinng to recover her balance, not due to fatigue   Time 4   Period  Weeks   Status Deferred           PT Long Term Goals - 07/27/17 0556      PT LONG TERM GOAL #1   Title Patient will be independent with updated HEP    Time 8   Period Weeks   Status New   Target Date 09/25/17     PT LONG TERM GOAL #2   Title Patient will ambulate modified independent 100 ft with RW without standing rest break.   Time 8   Period Weeks   Status New   Target Date 09/25/17     PT LONG TERM GOAL #3   Title Patient's gait velocity will improve by 10% over measure at 4 weeks (as a measure of decreased fall risk).    Time 8   Period Weeks   Status New   Target Date 09/25/17     PT LONG TERM GOAL #4   Title Patient's Functional Status score on FOTO will improve to >30 as a measure of improved function and patient's confidence in her ability.   Time 8   Period Weeks   Status New   Target Date 09/25/17               Plan - 08/26/17 1206    Clinical Impression Statement Patient on time today! Very minimal reports of vertigo/imbalance therefore focused on gait, balance and strength training more than vestibular rehab. Patient continues with imbalance due to severity of ataxia, however is able to stop and self-correct with use of RW and no cues needed. STGs assessed with pt meeting 1 of 4 goals, partially met 1 goal, 1 goal deferred, and 1 goal not met. Patient progressing slowly (due to continued reports of vertigo for many sessions).     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   PT Frequency 2x / week   PT Duration 8 weeks   PT Treatment/Interventions ADLs/Self Care Home Management;Canalith Repostioning;DME Instruction;Gait training;Stair training;Functional mobility training;Therapeutic activities;Therapeutic exercise;Balance training;Neuromuscular re-education;Patient/family education;Energy conservation;Vestibular   PT Next Visit Plan  do Gcode/prog note; add to HEP for strengthening (including core) and balance; pre-gait and gait training for incr wt-shift, step length, heelstrike, etc   Consulted and Agree with Plan of Care Patient      Patient will benefit from skilled therapeutic intervention in order to improve the following deficits and impairments:  Abnormal gait, Decreased activity tolerance, Decreased balance, Decreased coordination, Decreased endurance, Decreased knowledge of use of DME, Decreased mobility, Decreased strength, Dizziness, Impaired UE functional use  Visit Diagnosis: Other symptoms and signs involving the nervous system  Unsteady gait  Muscle weakness (generalized)     Problem List Patient Active Problem List   Diagnosis Date Noted  . Overactive bladder 08/18/2017  . Faintness   . Left rotator cuff tear 02/01/2015  . Closed left humeral fracture 12/07/2014  . Fatigue 12/07/2014  . 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 Dystonia 08/10/2014  . Syncope and collapse 06/08/2014  . Orthostatic hypotension 06/08/2014  . 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 Fall down stairs 12/11/2013  . Basilar skull fracture ([place]) 12/11/2013  . Traumatic intracranial hemorrhage ([place]) 12/11/2013  . Back pain 12/11/2013  . Subarachnoid hemorrhage following injury ([place])  12/09/2013  . Syncope 03/27/2013  .  Clavicular fracture 03/27/2013  . COUGH 12/28/2007    Rexanne Mano, PT 08/26/2017, 12:15 PM  [facility] [address] [place], [place], [address] Phone: [phone]   Fax:  [phone]  Name: Claudia Perez MRN: [id number] Date of Birth: [birth year]

## 2017-08-30 ENCOUNTER — Ambulatory Visit: Payer: Medicare Other | Attending: Family Medicine | Admitting: Physical Therapy

## 2017-08-30 DIAGNOSIS — R2681 Unsteadiness on feet: Secondary | ICD-10-CM

## 2017-08-30 DIAGNOSIS — M6281 Muscle weakness (generalized): Secondary | ICD-10-CM | POA: Diagnosis not present

## 2017-08-30 DIAGNOSIS — R2689 Other abnormalities of gait and mobility: Secondary | ICD-10-CM | POA: Insufficient documentation

## 2017-08-30 DIAGNOSIS — H8113 Benign paroxysmal vertigo, bilateral: Secondary | ICD-10-CM | POA: Insufficient documentation

## 2017-08-30 DIAGNOSIS — R29818 Other symptoms and signs involving the nervous system: Secondary | ICD-10-CM | POA: Insufficient documentation

## 2017-08-30 NOTE — Therapy (Signed)
[facility] [address] [place], [place], [address] Phone: [phone]   Fax:  [phone]  Physical Therapy Treatment  Patient Details  Name: Claudia Perez MRN: [id number] Date of Birth: [birth year] Referring Provider: Alger Simons MD  Encounter Date: 08/30/2017      PT End of Session - 08/30/17 1159    Visit Number 10   Number of Visits 17   Date for PT Re-Evaluation 09/24/17   Authorization Type Medicare, BCBS secondary   Authorization Time Period 07/26/17  to 09/24/17   PT Start Time 1100   PT Stop Time 1142   PT Time Calculation (min) 42 min   Equipment Utilized During Treatment Gait belt   Activity Tolerance Patient tolerated treatment well   Behavior During Therapy [facility] for tasks assessed/performed      Past Medical History:  Diagnosis Date  . Abnormality of gait   . Fall at home 03/27/2013   "lost consciousness; fractured right clavicle" (03/27/2013)  . GERD (gastroesophageal reflux disease)   . Hypercholesteremia   . Hyperthyroidism    "a little bit; don't take RX for it" (03/27/2013)  . Mobility impaired   . Other closed skull fracture without mention of intracranial injury, unspecified state of consciousness   . Seizures ([place])    "think I might have; been blacking out several times in the past" (03/27/2013)  . Shortness of breath    "even now, just talking" (03/27/2013)  . Subdural hemorrhage ([place])   . Vitamin D deficiency     Past Surgical History:  Procedure Laterality Date  . APPENDECTOMY  1960's  . BRAIN SURGERY  10/2003   SDH evac  . EYE SURGERY  02/2013   cataract extraction w/ intraocular lens implant    There were no vitals filed for this visit.      Subjective Assessment - 08/30/17 1104    Subjective nothing new to report; doing exercises 2x a day.  feels like she's improving   Patient Stated Goals be able to walk with her walker on her own; cure the vertigo   Currently in Pain?  No/denies                         [facility] Adult PT Treatment/Exercise - 08/30/17 1111      Bed Mobility   Sit to Supine 5: Supervision  increased time needed     Ambulation/Gait   Ambulation/Gait Assistance 5: Supervision;4: Min guard   Ambulation/Gait Assistance Details in 115' rest break x 1 after 75'; and 2 stops prior due to balance   Ambulation Distance (Feet) 200 Feet  115'; 85'   Assistive device Rolling walker   Gait Pattern Step-through pattern;Step-to pattern;Decreased stride length;Decreased hip/knee flexion - right;Right foot flat;Left foot flat;Shuffle;Ataxic;Wide base of support;Poor foot clearance - left;Poor foot clearance - right   Ambulation Surface Level;Indoor     Lumbar Exercises: Supine   Bridge 15 reps;5 seconds   Other Supine Lumbar Exercises single limb clamshell with red theraband x 15 reps bil   Other Supine Lumbar Exercises tall kneeling: trunk rotation x 8-10 reps and mini squats x 10 reps with min/mod A and cues for posture; pt needed mod A to get into/out of tall kneeling position             Balance Exercises - 08/30/17 1127      Balance Exercises: Standing   Standing Eyes Opened Solid surface;Narrow base of support (BOS);Head turns  Standing Eyes Closed Solid surface;Narrow base of support (BOS);3 reps;20 secs;Wide (BOA);Head turns           PT Education - 08/30/17 1159    Education provided Yes   Education Details balance HEP   Person(s) Educated Patient   Methods Explanation;Demonstration;Handout   Comprehension Returned demonstration;Verbalized understanding;Need further instruction          PT Short Term Goals - 08/26/17 1209      PT SHORT TERM GOAL #1   Title Patient will be asymptomatic with BPPV assessments.    Baseline 9/24 +for BPPV lt posterior canal and Lt horizontal canal; 9/26 roll test no nystagmus    Time 4   Period Weeks   Status Achieved     PT SHORT TERM GOAL #2   Title Patient will  complete TUG and gait velocity with goals set (as appropriate).    Time 1   Period Weeks   Status Not Met     PT SHORT TERM GOAL #3   Title Patient will be independent with HEP for strengthening and balance.    Baseline 9/27 independent with HEP, however primarily addressing vestibular rehab and balance. As this improves, will incorporate more strengthening exercises   Time 4   Status Partially Met     PT SHORT TERM GOAL #4   Title Patient will ambulate 60 ft without standing rest break with RW and supervision.    Baseline 9/24 stopped 3 times in 60 ft, each time due to imbalance andd stops to recover her balance (not due to fatigue); 9/27 again stops while walkinng to recover her balance, not due to fatigue   Time 4   Period Weeks   Status Deferred           PT Long Term Goals - 07/27/17 0556      PT LONG TERM GOAL #1   Title Patient will be independent with updated HEP    Time 8   Period Weeks   Status New   Target Date 09/25/17     PT LONG TERM GOAL #2   Title Patient will ambulate modified independent 100 ft with RW without standing rest break.   Time 8   Period Weeks   Status New   Target Date 09/25/17     PT LONG TERM GOAL #3   Title Patient's gait velocity will improve by 10% over measure at 4 weeks (as a measure of decreased fall risk).    Time 8   Period Weeks   Status New   Target Date 09/25/17     PT LONG TERM GOAL #4   Title Patient's Functional Status score on FOTO will improve to >30 as a measure of improved function and patient's confidence in her ability.   Time 8   Period Weeks   Status New   Target Date 09/25/17               Plan - 08/30/17 1159    Clinical Impression Statement Pt continues to need to stop due to balance and fatigue with ambulation, but only stopped 1 time during 41' of amb today.  Pt tolerated session well but continues to be limited by ataxia.  Will continue to benefit from PT to [facility] independence and function.    PT Treatment/Interventions ADLs/Self Care Home Management;Canalith Repostioning;DME Instruction;Gait training;Stair training;Functional mobility training;Therapeutic activities;Therapeutic exercise;Balance training;Neuromuscular re-education;Patient/family education;Energy conservation;Vestibular   PT Next Visit Plan review balance HEP; add to HEP for strengthening (including core) and  balance; pre-gait and gait training for incr wt-shift, step length, heelstrike, etc   Consulted and Agree with Plan of Care Patient      Patient will benefit from skilled therapeutic intervention in order to improve the following deficits and impairments:  Abnormal gait, Decreased activity tolerance, Decreased balance, Decreased coordination, Decreased endurance, Decreased knowledge of use of DME, Decreased mobility, Decreased strength, Dizziness, Impaired UE functional use  Visit Diagnosis: Other symptoms and signs involving the nervous system  Unsteady gait  Muscle weakness (generalized)  BPPV (benign paroxysmal positional vertigo), bilateral       G-Codes - September 17, 2017 1203    Functional Assessment Tool Used (Outpatient Only) Ambulates 60 ft with min assist and RW with 1 standing rest breaks   Functional Limitation Mobility: Walking and moving around   Mobility: Walking and Moving Around Current Status [id number]) At least 60 percent but less than 80 percent impaired, limited or restricted   Mobility: Walking and Moving Around Goal Status [phone]) At least 40 percent but less than 60 percent impaired, limited or restricted      Problem List Patient Active Problem List   Diagnosis Date Noted  . Overactive bladder 08/18/2017  . Faintness   . Left rotator cuff tear 02/01/2015  . Closed left humeral fracture 12/07/2014  . Fatigue 12/07/2014  . 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 Dystonia 08/10/2014  . Syncope and collapse 06/08/2014  .  Orthostatic hypotension 06/08/2014  . 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 Fall down stairs 12/11/2013  . Basilar skull fracture ([place]) 12/11/2013  . Traumatic intracranial hemorrhage ([place]) 12/11/2013  . Back pain 12/11/2013  . Subarachnoid hemorrhage following injury ([place]) 12/09/2013  . Syncope 03/27/2013  . Clavicular fracture 03/27/2013  . COUGH 12/28/2007      Laureen Abrahams, PT, DPT 09/17/17 12:04 PM   Physical Therapy Progress Note  Dates of Reporting Period: 07/27/17 to 09-17-2017  Objective Reports of Subjective Statement: Patient reports her dizziness is better and she feels it is getting better with the vestibular exercises she is now doing.  Objective Measurements: Patient now ambulating 60 ft with only 1 rest period compared to initially required 4 rest breaks.  Goal Update: NA, continue towards LTGs due10/27  Plan: Continue with current PT POC.   Reason Skilled Services are Required: Patient remains at high risk for falls due to imbalance, abnormal gait and dizziness. Has shown slow improvement and hopefully will improve more quickly now that vertigo is much improved.    Barry Brunner, PT [facility] [address] [place], [place] [address] [phone]       [facility] [address] [place], [place], [address] Phone: [phone]   Fax:  [phone]  Name: Verlean Allport MRN: [id number] Date of Birth: Sep 26, 1951

## 2017-08-30 NOTE — Patient Instructions (Signed)
   STAND IN A CORNER WITH A CHAIR IN FRONT OF YOU FOR ALL THESE EXERCISES!  Feet Together, Varied Arm Positions - Eyes Closed    Stand with feet together and arms AT YOUR SIDE. Close eyes and visualize upright position. Hold __10-15__ seconds. Repeat __3-__ times per session. Do __2-3__ sessions per day.   Feet Together, Head Motion - Eyes Open    With eyes open, feet together, move head slowly: up and down 10 times each way and side to side 10 times each way. Repeat __1-2__ times per session. Do __2-3__ sessions per day.   Feet Apart, Head Motion - Eyes Closed    With eyes closed and feet shoulder width apart, move head slowly, up and down 10 times each way and side to side 10 times each way. Repeat _1-2___ times per session. Do __2-3__ sessions per day.  Copyright  VHI. All rights reserved.

## 2017-09-01 ENCOUNTER — Ambulatory Visit: Payer: Medicare Other | Admitting: Physical Therapy

## 2017-09-02 ENCOUNTER — Encounter: Payer: Self-pay | Admitting: Physical Therapy

## 2017-09-02 ENCOUNTER — Ambulatory Visit: Payer: Medicare Other | Admitting: Physical Therapy

## 2017-09-02 DIAGNOSIS — H8113 Benign paroxysmal vertigo, bilateral: Secondary | ICD-10-CM | POA: Diagnosis not present

## 2017-09-02 DIAGNOSIS — R29818 Other symptoms and signs involving the nervous system: Secondary | ICD-10-CM | POA: Diagnosis not present

## 2017-09-02 DIAGNOSIS — R2689 Other abnormalities of gait and mobility: Secondary | ICD-10-CM | POA: Diagnosis not present

## 2017-09-02 DIAGNOSIS — M6281 Muscle weakness (generalized): Secondary | ICD-10-CM | POA: Diagnosis not present

## 2017-09-02 DIAGNOSIS — R2681 Unsteadiness on feet: Secondary | ICD-10-CM

## 2017-09-02 NOTE — Patient Instructions (Signed)
STAND IN A CORNER WITH A CHAIR IN FRONT OF YOU FOR ALL THESE EXERCISES!    Feet Together, Head Motion - Eyes Open    With eyes open, feet together, move head slowly: up and down 10 times each way and side to side 10 times each way. Repeat __1-2__ times per session. Do __2-3__ sessions per day.   Feet Apart, Head Motion - Eyes Closed    With eyes closed and feet shoulder width apart, move head slowly, up and down 10 times each way and side to side 10 times each way. Repeat _1-2___ times per session. Do __2-3__ sessions per day.  Copyright  VHI. All rights reserved.

## 2017-09-02 NOTE — Therapy (Signed)
[facility] [address] [place], [place], [address] Phone: [phone]   Fax:  [phone]  Physical Therapy Treatment  Patient Details  Name: Claudia Perez MRN: [id number] Date of Birth: 05-06-1951 Referring Provider: Alger Simons MD  Encounter Date: 09/02/2017      PT End of Session - 09/02/17 2205    Visit Number 11   Number of Visits 17   Date for PT Re-Evaluation 09/24/17   Authorization Type Medicare, BCBS secondary   Authorization Time Period 07/26/17  to 09/24/17   PT Start Time 1405   PT Stop Time 1445   PT Time Calculation (min) 40 min   Equipment Utilized During Treatment Gait belt   Activity Tolerance Patient tolerated treatment well   Behavior During Therapy [facility] for tasks assessed/performed      Past Medical History:  Diagnosis Date  . Abnormality of gait   . Fall at home 03/27/2013   "lost consciousness; fractured right clavicle" (03/27/2013)  . GERD (gastroesophageal reflux disease)   . Hypercholesteremia   . Hyperthyroidism    "a little bit; don't take RX for it" (03/27/2013)  . Mobility impaired   . Other closed skull fracture without mention of intracranial injury, unspecified state of consciousness   . Seizures ([place])    "think I might have; been blacking out several times in the past" (03/27/2013)  . Shortness of breath    "even now, just talking" (03/27/2013)  . Subdural hemorrhage ([place])   . Vitamin D deficiency     Past Surgical History:  Procedure Laterality Date  . APPENDECTOMY  1960's  . BRAIN SURGERY  10/2003   SDH evac  . EYE SURGERY  02/2013   cataract extraction w/ intraocular lens implant    There were no vitals filed for this visit.      Subjective Assessment - 09/02/17 2149    Subjective Doesn't have spinning with rolling habituation exercise anymore. Feels her balance and walking are improving.    Patient Stated Goals be able to walk with her walker on her own; cure  the vertigo   Currently in Pain? No/denies                         [facility] Adult PT Treatment/Exercise - 09/02/17 1445      Transfers   Sit to Stand 4: Min guard;4: Min assist   Sit to Stand Details (indicate cue type and reason) 5 reps standing onto blue aerex mat from chair with hands on thighs; other transfers with proper hand placement except one reminder not to pull up on the RW   Stand to Sit 4: Min guard   Stand to Sit Details from standing on blue airex     Ambulation/Gait   Ambulation/Gait Assistance 4: Min guard   Ambulation/Gait Assistance Details 120 ft with only 3 rest breaks and no LOB; repeated same   Ambulation Distance (Feet) 120 Feet  x2   Assistive device Rolling walker   Gait Pattern Step-through pattern;Step-to pattern;Decreased stride length;Right foot flat;Left foot flat;Shuffle;Ataxic;Wide base of support;Poor foot clearance - left;Poor foot clearance - right;Lateral hip instability   Ambulation Surface Level             Balance Exercises - 09/02/17 1422      Balance Exercises: Standing   Standing Eyes Opened Narrow base of support (BOS);Head turns;Solid surface; 2  Sets of 10 reps    Standing Eyes Closed Narrow base of  support (BOS)           PT Education - 09/02/17 2204    Education provided Yes   Education Details reviewed entire HEP and prioritized exercises, removed 2 exercises   Person(s) Educated Patient   Methods Explanation;Demonstration;Verbal cues;Handout   Comprehension Verbalized understanding;Returned demonstration;Verbal cues required;Need further instruction          PT Short Term Goals - 08/26/17 1209      PT SHORT TERM GOAL #1   Title Patient will be asymptomatic with BPPV assessments.    Baseline 9/24 +for BPPV lt posterior canal and Lt horizontal canal; 9/26 roll test no nystagmus    Time 4   Period Weeks   Status Achieved     PT SHORT TERM GOAL #2   Title Patient will complete TUG and gait velocity  with goals set (as appropriate).    Time 1   Period Weeks   Status Not Met     PT SHORT TERM GOAL #3   Title Patient will be independent with HEP for strengthening and balance.    Baseline 9/27 independent with HEP, however primarily addressing vestibular rehab and balance. As this improves, will incorporate more strengthening exercises   Time 4   Status Partially Met     PT SHORT TERM GOAL #4   Title Patient will ambulate 60 ft without standing rest break with RW and supervision.    Baseline 9/24 stopped 3 times in 60 ft, each time due to imbalance andd stops to recover her balance (not due to fatigue); 9/27 again stops while walkinng to recover her balance, not due to fatigue   Time 4   Period Weeks   Status Deferred           PT Long Term Goals - 07/27/17 0556      PT LONG TERM GOAL #1   Title Patient will be independent with updated HEP    Time 8   Period Weeks   Status New   Target Date 09/25/17     PT LONG TERM GOAL #2   Title Patient will ambulate modified independent 100 ft with RW without standing rest break.   Time 8   Period Weeks   Status New   Target Date 09/25/17     PT LONG TERM GOAL #3   Title Patient's gait velocity will improve by 10% over measure at 4 weeks (as a measure of decreased fall risk).    Time 8   Period Weeks   Status New   Target Date 09/25/17     PT LONG TERM GOAL #4   Title Patient's Functional Status score on FOTO will improve to >30 as a measure of improved function and patient's confidence in her ability.   Time 8   Period Weeks   Status New   Target Date 09/25/17               Plan - 09/02/17 2207    Clinical Impression Statement Patient very pleased that her vertigo has stopped. Encouraged her to continue habituation and VOR x1 exercises. Focused on balance training and updating HEP. Patient is progressing slowly due to prolonged period of vertigo and h/o ataxia. Will need to begin to consider if she could benefit  from an extension beyond 10/27.    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   PT Frequency 2x / week   PT Duration 8  weeks   PT Treatment/Interventions ADLs/Self Care Home Management;Canalith Repostioning;DME Instruction;Gait training;Stair training;Functional mobility training;Therapeutic activities;Therapeutic exercise;Balance training;Neuromuscular re-education;Patient/family education;Energy conservation;Vestibular   PT Next Visit Plan begin to consider will she benefit from recert vs ready to d/c;  balance; pre-gait and gait training for incr wt-shift, step length, heelstrike, etc   Consulted and Agree with Plan of Care Patient      Patient will benefit from skilled therapeutic intervention in order to improve the following deficits and impairments:  Abnormal gait, Decreased activity tolerance, Decreased balance, Decreased coordination, Decreased endurance, Decreased knowledge of use of DME, Decreased mobility, Decreased strength, Dizziness, Impaired UE functional use  Visit Diagnosis: Other symptoms and signs involving the nervous system  Unsteady gait     Problem List Patient Active Problem List   Diagnosis Date Noted  . Overactive bladder 08/18/2017  . Faintness   . Left rotator cuff tear 02/01/2015  . Closed left humeral fracture 12/07/2014  . Fatigue 12/07/2014  . 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 Dystonia 08/10/2014  . Syncope and collapse 06/08/2014  . Orthostatic hypotension 06/08/2014  . 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 Fall down stairs 12/11/2013  . Basilar skull fracture ([place]) 12/11/2013  . Traumatic intracranial hemorrhage ([place]) 12/11/2013  . Back pain 12/11/2013  . Subarachnoid hemorrhage following injury ([place])  12/09/2013  . Syncope 03/27/2013  . Clavicular fracture 03/27/2013  . COUGH 12/28/2007    Rexanne Mano, PT 09/02/2017, 10:12 PM  [facility] [address] [place], [place], [address] Phone: [phone]   Fax:  [phone]  Name: Claudia Perez MRN: [id number] Date of Birth: [birth year]

## 2017-09-06 ENCOUNTER — Ambulatory Visit: Payer: Medicare Other | Admitting: Physical Therapy

## 2017-09-06 ENCOUNTER — Encounter: Payer: Self-pay | Admitting: Physical Therapy

## 2017-09-06 DIAGNOSIS — R2681 Unsteadiness on feet: Secondary | ICD-10-CM

## 2017-09-06 DIAGNOSIS — Z23 Encounter for immunization: Secondary | ICD-10-CM | POA: Diagnosis not present

## 2017-09-06 DIAGNOSIS — R2689 Other abnormalities of gait and mobility: Secondary | ICD-10-CM | POA: Diagnosis not present

## 2017-09-06 DIAGNOSIS — M6281 Muscle weakness (generalized): Secondary | ICD-10-CM | POA: Diagnosis not present

## 2017-09-06 DIAGNOSIS — R635 Abnormal weight gain: Secondary | ICD-10-CM | POA: Diagnosis not present

## 2017-09-06 DIAGNOSIS — R29818 Other symptoms and signs involving the nervous system: Secondary | ICD-10-CM | POA: Diagnosis not present

## 2017-09-06 DIAGNOSIS — H8113 Benign paroxysmal vertigo, bilateral: Secondary | ICD-10-CM | POA: Diagnosis not present

## 2017-09-06 NOTE — Therapy (Signed)
[facility] [address] [place], [place], [address] Phone: [phone]   Fax:  [phone]  Physical Therapy Treatment  Patient Details  Name: Claudia Perez MRN: [id number] Date of Birth: 1951-04-29 Referring Provider: Alger Simons MD  Encounter Date: 09/06/2017      PT End of Session - 09/06/17 1200    Visit Number 12   Number of Visits 17   Date for PT Re-Evaluation 09/24/17   Authorization Type Medicare, BCBS secondary   Authorization Time Period 07/26/17  to 09/24/17   PT Start Time 1104   PT Stop Time 1145   PT Time Calculation (min) 41 min   Equipment Utilized During Treatment Gait belt   Activity Tolerance Patient tolerated treatment well   Behavior During Therapy [facility] for tasks assessed/performed      Past Medical History:  Diagnosis Date  . Abnormality of gait   . Fall at home 03/27/2013   "lost consciousness; fractured right clavicle" (03/27/2013)  . GERD (gastroesophageal reflux disease)   . Hypercholesteremia   . Hyperthyroidism    "a little bit; don't take RX for it" (03/27/2013)  . Mobility impaired   . Other closed skull fracture without mention of intracranial injury, unspecified state of consciousness   . Seizures ([place])    "think I might have; been blacking out several times in the past" (03/27/2013)  . Shortness of breath    "even now, just talking" (03/27/2013)  . Subdural hemorrhage ([place])   . Vitamin D deficiency     Past Surgical History:  Procedure Laterality Date  . APPENDECTOMY  1960's  . BRAIN SURGERY  10/2003   SDH evac  . EYE SURGERY  02/2013   cataract extraction w/ intraocular lens implant    There were no vitals filed for this visit.      Subjective Assessment - 09/06/17 1109    Subjective When I don't do my neck stretches before I try to walk to the bathroom, I get dizzy. yesterday I fell on my bottom. I didn't get hurt.   Patient Stated Goals be able to walk with her  walker on her own; cure the vertigo   Currently in Pain? No/denies                         [facility] Adult PT Treatment/Exercise - 09/06/17 0001      Bed Mobility   Bed Mobility Rolling Right;Rolling Left;Left Sidelying to Sit;Sit to Sidelying Left   Rolling Right 5: Supervision   Rolling Left 5: Supervision   Left Sidelying to Sit 3: Mod assist   Sit to Sidelying Left 4: Min assist     Transfers   Transfers Sit to Stand;Stand to Sit   Sit to Stand 4: Min guard;5: Supervision   Sit to Stand Details (indicate cue type and reason) 2 of 5 trials needed vc to keep hands off RW to come to stand   Stand to Sit 4: Min guard   Stand to Sit Details vc for controlled descent      Ambulation/Gait   Ambulation/Gait Assistance 4: Min guard   Ambulation/Gait Assistance Details vc for keeping shoulders forward over her hips/pelvis (not overextending hips and "hanging" on Y ligaments) with good results   Ambulation Distance (Feet) 60 Feet  x2   Assistive device Rolling walker   Gait Pattern Step-through pattern;Step-to pattern;Decreased stride length;Right foot flat;Left foot flat;Shuffle;Ataxic;Wide base of support;Poor foot clearance - left;Poor foot clearance -  right;Lateral hip instability   Ambulation Surface Level     Posture/Postural Control   Posture/Postural Control Postural limitations   Posture Comments trunk remains ataxic, with verbal and tactile cues, she can brace with her abdominals to decrease ant-posterior sway     Exercises   Other Exercises  tall kneeling with squats x 10; mod assist to "step" onto mat into tall kneeling; mod assist to sit down laterally onto her left hip and turn to sit     Lumbar Exercises: Supine   Ab Set 10 reps  with head lift with assist; in hooklying   Heel Slides 10 reps   Heel Slides Limitations start bil knees bent, slowly straighten one leg with abd set and return to hooklying   Bent Knee Raise 10 reps   Bent Knee Raise  Limitations alternating; tactile cues for abd set   Other Supine Lumbar Exercises knee extension and  flexoin with abd set   Other Supine Lumbar Exercises hooklying, lower and raise bil legs to mat on left then right  x 15 reps; emphasis on abdominal use     Knee/Hip Exercises: Standing   Functional Squat 10 reps;1 set  holding RW                PT Education - 09/06/17 1159    Education provided Yes   Education Details pt requested add heelslides to HEP (for abdominal set/strengthening)   Person(s) Educated Patient   Methods Explanation;Demonstration;Verbal cues;Handout   Comprehension Verbalized understanding;Returned demonstration;Verbal cues required;Need further instruction          PT Short Term Goals - 08/26/17 1209      PT SHORT TERM GOAL #1   Title Patient will be asymptomatic with BPPV assessments.    Baseline 9/24 +for BPPV lt posterior canal and Lt horizontal canal; 9/26 roll test no nystagmus    Time 4   Period Weeks   Status Achieved     PT SHORT TERM GOAL #2   Title Patient will complete TUG and gait velocity with goals set (as appropriate).    Time 1   Period Weeks   Status Not Met     PT SHORT TERM GOAL #3   Title Patient will be independent with HEP for strengthening and balance.    Baseline 9/27 independent with HEP, however primarily addressing vestibular rehab and balance. As this improves, will incorporate more strengthening exercises   Time 4   Status Partially Met     PT SHORT TERM GOAL #4   Title Patient will ambulate 60 ft without standing rest break with RW and supervision.    Baseline 9/24 stopped 3 times in 60 ft, each time due to imbalance andd stops to recover her balance (not due to fatigue); 9/27 again stops while walkinng to recover her balance, not due to fatigue   Time 4   Period Weeks   Status Deferred           PT Long Term Goals - 07/27/17 0556      PT LONG TERM GOAL #1   Title Patient will be independent with  updated HEP    Time 8   Period Weeks   Status New   Target Date 09/25/17     PT LONG TERM GOAL #2   Title Patient will ambulate modified independent 100 ft with RW without standing rest break.   Time 8   Period Weeks   Status New   Target Date 09/25/17  PT LONG TERM GOAL #3   Title Patient's gait velocity will improve by 10% over measure at 4 weeks (as a measure of decreased fall risk).    Time 8   Period Weeks   Status New   Target Date 09/25/17     PT LONG TERM GOAL #4   Title Patient's Functional Status score on FOTO will improve to >30 as a measure of improved function and patient's confidence in her ability.   Time 8   Period Weeks   Status New   Target Date 09/25/17               Plan - 09/06/17 1201    Clinical Impression Statement Patient remains motivated (has been on time for appointments--today PT was running late!) and reports she is doing her HEP daily. Session focused on core strengthening and then carryover to ambulation with good results. She required less physical assist with balance with cues for tight abdomen and shoulders over her pelvis. Patient can continue to benefit from PT to work towards [place].    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   PT Frequency 2x / week   PT Duration 8 weeks   PT Treatment/Interventions ADLs/Self Care Home Management;Canalith Repostioning;DME Instruction;Gait training;Stair training;Functional mobility training;Therapeutic activities;Therapeutic exercise;Balance training;Neuromuscular re-education;Patient/family education;Energy conservation;Vestibular   PT Next Visit Plan begin to consider will she benefit from recert vs ready to d/c; core strengthening; balance; pre-gait and gait training for incr wt-shift, step length, heelstrike, etc   Consulted and Agree with Plan of Care Patient      Patient will benefit from skilled therapeutic  intervention in order to improve the following deficits and impairments:  Abnormal gait, Decreased activity tolerance, Decreased balance, Decreased coordination, Decreased endurance, Decreased knowledge of use of DME, Decreased mobility, Decreased strength, Dizziness, Impaired UE functional use  Visit Diagnosis: Other symptoms and signs involving the nervous system  Unsteady gait  Muscle weakness (generalized)     Problem List Patient Active Problem List   Diagnosis Date Noted  . Overactive bladder 08/18/2017  . Faintness   . Left rotator cuff tear 02/01/2015  . Closed left humeral fracture 12/07/2014  . Fatigue 12/07/2014  . 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 Dystonia 08/10/2014  . Syncope and collapse 06/08/2014  . Orthostatic hypotension 06/08/2014  . 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 Fall down stairs 12/11/2013  . Basilar skull fracture ([place]) 12/11/2013  . Traumatic intracranial hemorrhage ([place]) 12/11/2013  . Back pain 12/11/2013  . Subarachnoid hemorrhage following injury ([place]) 12/09/2013  . Syncope 03/27/2013  . Clavicular fracture 03/27/2013  . COUGH 12/28/2007    Rexanne Mano, PT 09/06/2017, 12:08 PM  [facility] [address] [place], [place], [address] Phone: [phone]   Fax:  [phone]  Name: Claudia Perez MRN: [id number] Date of Birth: 1950-12-19

## 2017-09-06 NOTE — Patient Instructions (Signed)
Abdominals: Single Leg Bend    Lying on back with legs bent, tighten your stomach and then slowly slide heel along floor to straighten your leg. Slowly return to starting position. Repeat with the other leg.  Repeat _10-15___ times each leg per set. Do __1__ sets per session. Do ___1_ sessions per day.  Copyright  VHI. All rights reserved.

## 2017-09-09 ENCOUNTER — Encounter: Payer: Self-pay | Admitting: Physical Therapy

## 2017-09-09 ENCOUNTER — Ambulatory Visit: Payer: Medicare Other | Admitting: Physical Therapy

## 2017-09-09 DIAGNOSIS — R29818 Other symptoms and signs involving the nervous system: Secondary | ICD-10-CM | POA: Diagnosis not present

## 2017-09-09 DIAGNOSIS — R2681 Unsteadiness on feet: Secondary | ICD-10-CM | POA: Diagnosis not present

## 2017-09-09 DIAGNOSIS — H8113 Benign paroxysmal vertigo, bilateral: Secondary | ICD-10-CM | POA: Diagnosis not present

## 2017-09-09 DIAGNOSIS — M6281 Muscle weakness (generalized): Secondary | ICD-10-CM

## 2017-09-09 DIAGNOSIS — R2689 Other abnormalities of gait and mobility: Secondary | ICD-10-CM | POA: Diagnosis not present

## 2017-09-09 NOTE — Therapy (Signed)
[facility] [address] [place], [place], [address] Phone: [phone]   Fax:  [phone]  Physical Therapy Treatment  Patient Details  Name: Claudia Perez MRN: [id number] Date of Birth: [birth year] Referring Provider: Alger Simons MD  Encounter Date: 09/09/2017      PT End of Session - 09/09/17 1204    Visit Number 13   Number of Visits 17   Date for PT Re-Evaluation 09/24/17   Authorization Type Medicare, BCBS secondary   Authorization Time Period 07/26/17  to 09/24/17   PT Start Time 1107   PT Stop Time 1150   PT Time Calculation (min) 43 min   Equipment Utilized During Treatment Gait belt   Activity Tolerance Patient tolerated treatment well   Behavior During Therapy [facility] for tasks assessed/performed      Past Medical History:  Diagnosis Date  . Abnormality of gait   . Fall at home 03/27/2013   "lost consciousness; fractured right clavicle" (03/27/2013)  . GERD (gastroesophageal reflux disease)   . Hypercholesteremia   . Hyperthyroidism    "a little bit; don't take RX for it" (03/27/2013)  . Mobility impaired   . Other closed skull fracture without mention of intracranial injury, unspecified state of consciousness   . Seizures ([place])    "think I might have; been blacking out several times in the past" (03/27/2013)  . Shortness of breath    "even now, just talking" (03/27/2013)  . Subdural hemorrhage ([place])   . Vitamin D deficiency     Past Surgical History:  Procedure Laterality Date  . APPENDECTOMY  1960's  . BRAIN SURGERY  10/2003   SDH evac  . EYE SURGERY  02/2013   cataract extraction w/ intraocular lens implant    There were no vitals filed for this visit.      Subjective Assessment - 09/09/17 1110    Subjective nothing new. Feels she is doing better with her "E" exercise. Her rolling exercise still causes a little spinning sometimes.   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 No/denies                         [facility] Adult PT Treatment/Exercise - 09/09/17 0001      Bed Mobility   Bed Mobility Rolling Right;Rolling Left;Supine to Sit;Sit to Sidelying Right   Rolling Right 6: Modified independent (Device/Increase time)   Rolling Left 6: Modified independent (Device/Increase time)   Supine to Sit 2: Max assist   Sit to Supine --   Sit to Sidelying Right 4: Min guard     Transfers   Transfers Sit to Stand;Stand to Sit   Sit to Stand 4: Min guard;5: Supervision   Sit to Stand Details (indicate cue type and reason) 50% of trials required vc to not place hands on RW prior to standing; 100% of the time she took hands off RW prior to sit     Ambulation/Gait   Ambulation/Gait Assistance 4: Min guard   Ambulation/Gait Assistance Details utilized 4  1 lb weights across her shoulders and upper torso to simulate a weighted vest. She appeared to have less ataxia of her trunk while walking, however she did not think it felt any different. Moved weights  to belt around her waist and she felt it helped her gait, however it did not appear to settle her ataxia as much.    Ambulation Distance (Feet) 40 Feet  50, 50, 40   Assistive device Rolling walker   Gait Pattern Step-through pattern;Step-to pattern;Decreased stride length;Right foot flat;Left foot flat;Shuffle;Ataxic;Wide base of support;Poor foot clearance - left;Poor foot clearance - right;Lateral hip instability   Ambulation Surface Level     Lumbar Exercises: Supine   Bridge Limitations second set with unilateral heel lift, alternating (she could not march and control) x 10 reps     Lumbar Exercises: Sidelying   Hip Abduction 10 reps;5 seconds   Hip Abduction Limitations has truncal ataxia that causes her to sway in  sidelying; able to focus and control while raising leg                PT Education - 09/09/17 1204    Education provided Yes   Education Details see additions to HEP for core strengthening   Person(s) Educated Patient   Methods Explanation;Demonstration;Verbal cues;Handout   Comprehension Verbalized understanding;Returned demonstration;Need further instruction          PT Short Term Goals - 08/26/17 1209      PT SHORT TERM GOAL #1   Title Patient will be asymptomatic with BPPV assessments.    Baseline 9/24 +for BPPV lt posterior canal and Lt horizontal canal; 9/26 roll test no nystagmus    Time 4   Period Weeks   Status Achieved     PT SHORT TERM GOAL #2   Title Patient will complete TUG and gait velocity with goals set (as appropriate).    Time 1   Period Weeks   Status Not Met     PT SHORT TERM GOAL #3   Title Patient will be independent with HEP for strengthening and balance.    Baseline 9/27 independent with HEP, however primarily addressing vestibular rehab and balance. As this improves, will incorporate more strengthening exercises   Time 4   Status Partially Met     PT SHORT TERM GOAL #4   Title Patient will ambulate 60 ft without standing rest break with RW and supervision.    Baseline 9/24 stopped 3 times in 60 ft, each time due to imbalance andd stops to recover her balance (not due to fatigue); 9/27 again stops while walkinng to recover her balance, not due to fatigue   Time 4   Period Weeks   Status Deferred           PT Long Term Goals - 07/27/17 0556      PT LONG TERM GOAL #1   Title Patient will be independent with updated HEP    Time 8   Period Weeks   Status New   Target Date 09/25/17     PT LONG TERM GOAL #2   Title Patient will ambulate modified independent 100 ft with RW without standing rest break.   Time 8   Period Weeks   Status New   Target Date 09/25/17     PT LONG TERM GOAL #3   Title Patient's gait velocity will  improve by 10% over measure at 4 weeks (as a measure of decreased fall risk).    Time 8   Period Weeks   Status New   Target Date 09/25/17     PT LONG TERM GOAL #4   Title Patient's Functional Status score on FOTO will improve to >30 as a measure  of improved function and patient's confidence in her ability.   Time 8   Period Weeks   Status New   Target Date 09/25/17               Plan - 09/09/17 1205    Clinical Impression Statement Session focused on core strengthening exercises and utilized weights (4 1 lb) on he torso to attempt to control truncal ataxia with mixed results. appeared improved with weights over shoulders, however pt felt improved with weights around her waist. 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   PT Frequency 2x / week   PT Duration 8 weeks   PT Treatment/Interventions ADLs/Self Care Home Management;Canalith Repostioning;DME Instruction;Gait training;Stair training;Functional mobility training;Therapeutic activities;Therapeutic exercise;Balance training;Neuromuscular re-education;Patient/family education;Energy conservation;Vestibular   PT Next Visit Plan only 4 more appts until d/c--begin to discuss with pt; core strengthening; balance; pre-gait and gait training for incr wt-shift, step length, heelstrike, etc; ?try weighted "vest" for ataxia   Consulted and Agree with Plan of Care Patient      Patient will benefit from skilled therapeutic intervention in order to improve the following deficits and impairments:  Abnormal gait, Decreased activity tolerance, Decreased balance, Decreased coordination, Decreased endurance, Decreased knowledge of use of DME, Decreased mobility, Decreased strength, Dizziness, Impaired UE functional use  Visit Diagnosis: Other symptoms and signs involving the  nervous system  Unsteady gait  Muscle weakness (generalized)     Problem List Patient Active Problem List   Diagnosis Date Noted  . Overactive bladder 08/18/2017  . Faintness   . Left rotator cuff tear 02/01/2015  . Closed left humeral fracture 12/07/2014  . Fatigue 12/07/2014  . 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 Dystonia 08/10/2014  . Syncope and collapse 06/08/2014  . Orthostatic hypotension 06/08/2014  . 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 Fall down stairs 12/11/2013  . Basilar skull fracture ([place]) 12/11/2013  . Traumatic intracranial hemorrhage ([place]) 12/11/2013  . Back pain 12/11/2013  . Subarachnoid hemorrhage following injury ([place]) 12/09/2013  . Syncope 03/27/2013  . Clavicular fracture 03/27/2013  . COUGH 12/28/2007    Rexanne Mano, PT 09/09/2017, 12:11 PM  [facility] [address] [place], [place], [address] Phone: [phone]   Fax:  [phone]  Name: Claudia Perez MRN: [id number] Date of Birth: February 04, 1951

## 2017-09-09 NOTE — Patient Instructions (Signed)
PELVIC STABILIZATION: Basic Bridge    Lift hips. Raise your right heel, put it down, raise your left heel, put it down. Then gently lower your hips. Repeat __10-15_ times. Do _1__ times per day.  Copyright  VHI. All rights reserved.   Abduction: Side Leg Lift (Eccentric) - Side-Lying    Lie on side. Bend the bottom leg (resting on the bed) Lift top leg slightly higher than shoulder level. Hold for a count of 5.  _10__ reps on each side Try to keep your body as still as possible when lifing your leg.    http://ecce.exer.us/63   Copyright  VHI. All rights reserved.

## 2017-09-13 ENCOUNTER — Telehealth: Payer: Self-pay | Admitting: Physical Therapy

## 2017-09-13 ENCOUNTER — Encounter: Payer: Self-pay | Admitting: Physical Therapy

## 2017-09-13 ENCOUNTER — Ambulatory Visit: Payer: Medicare Other | Admitting: Physical Therapy

## 2017-09-13 DIAGNOSIS — M6281 Muscle weakness (generalized): Secondary | ICD-10-CM | POA: Diagnosis not present

## 2017-09-13 DIAGNOSIS — R2689 Other abnormalities of gait and mobility: Secondary | ICD-10-CM

## 2017-09-13 DIAGNOSIS — R29818 Other symptoms and signs involving the nervous system: Secondary | ICD-10-CM | POA: Diagnosis not present

## 2017-09-13 DIAGNOSIS — R2681 Unsteadiness on feet: Secondary | ICD-10-CM | POA: Diagnosis not present

## 2017-09-13 DIAGNOSIS — H8113 Benign paroxysmal vertigo, bilateral: Secondary | ICD-10-CM | POA: Diagnosis not present
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
## 2017-09-13 NOTE — Telephone Encounter (Signed)
Dr. Naaman Plummer,  Ms. Allman has made progress with reducing (nearly eliminating) her vertigo and is better able to work on balance and gait training. It was noted that her current RW is too tall for her.   Husband and patient both report it has been more than 5 years since she obtained a RW.   If you agree, please order Rolling Walker with 5 inch wheels in Epic.   Thank you for your help,   Barry Brunner, PT Outpatient Neurorehabilitation [address], [place], [place] [address] [phone]

## 2017-09-15 ENCOUNTER — Ambulatory Visit: Payer: Medicare Other | Admitting: Physical Therapy

## 2017-09-15 ENCOUNTER — Encounter: Payer: Self-pay | Admitting: Physical Therapy

## 2017-09-15 DIAGNOSIS — R2689 Other abnormalities of gait and mobility: Secondary | ICD-10-CM

## 2017-09-15 DIAGNOSIS — M6281 Muscle weakness (generalized): Secondary | ICD-10-CM

## 2017-09-15 DIAGNOSIS — R29818 Other symptoms and signs involving the nervous system: Secondary | ICD-10-CM | POA: Diagnosis not present

## 2017-09-15 DIAGNOSIS — H8113 Benign paroxysmal vertigo, bilateral: Secondary | ICD-10-CM | POA: Diagnosis not present

## 2017-09-15 DIAGNOSIS — R2681 Unsteadiness on feet: Secondary | ICD-10-CM

## 2017-09-15 NOTE — Telephone Encounter (Signed)
Order written. Thanks!
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
## 2017-09-15 NOTE — Patient Instructions (Signed)
Axial Extension (Chin Tuck)    Pull chin in and lengthen back of neck (think of bringing your ear back over your shoulder). Hold __10-30__ seconds. Repeat __5__ times. Do __1__ sessions per day.  http://[email or web address].exer.us/450   Copyright  VHI. All rights reserved.

## 2017-09-20 ENCOUNTER — Encounter: Payer: Self-pay | Admitting: Physical Therapy

## 2017-09-20 ENCOUNTER — Ambulatory Visit: Payer: Medicare Other | Admitting: Physical Therapy

## 2017-09-20 DIAGNOSIS — R29818 Other symptoms and signs involving the nervous system: Secondary | ICD-10-CM | POA: Diagnosis not present

## 2017-09-20 DIAGNOSIS — R2681 Unsteadiness on feet: Secondary | ICD-10-CM | POA: Diagnosis not present

## 2017-09-20 DIAGNOSIS — R2689 Other abnormalities of gait and mobility: Secondary | ICD-10-CM | POA: Diagnosis not present

## 2017-09-20 DIAGNOSIS — M6281 Muscle weakness (generalized): Secondary | ICD-10-CM | POA: Diagnosis not present

## 2017-09-20 DIAGNOSIS — H8113 Benign paroxysmal vertigo, bilateral: Secondary | ICD-10-CM | POA: Diagnosis not present
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
## 2017-09-20 NOTE — Patient Instructions (Addendum)
(  Exercise) Monday Tuesday Wednesday Thursday Friday Saturday Sunday  Lying down: [employer] for dizziness           Abdominal: single leg bend            Sidelying leg lift          Seated: Marching            Modified sit up            "E" exercise            Forward bend            Chin tuck                           (Exercise) Monday Tuesday Wednesday Thursday Friday Saturday Sunday   Standing: Sit to stand           Standing step out and in           [employer] exercises

## 2017-09-23 ENCOUNTER — Encounter: Payer: Self-pay | Admitting: Physical Therapy

## 2017-09-23 ENCOUNTER — Ambulatory Visit: Payer: Medicare Other | Admitting: Physical Therapy

## 2017-09-23 DIAGNOSIS — R2689 Other abnormalities of gait and mobility: Secondary | ICD-10-CM | POA: Diagnosis not present

## 2017-09-23 DIAGNOSIS — R29818 Other symptoms and signs involving the nervous system: Secondary | ICD-10-CM

## 2017-09-23 DIAGNOSIS — M6281 Muscle weakness (generalized): Secondary | ICD-10-CM

## 2017-09-23 DIAGNOSIS — H8113 Benign paroxysmal vertigo, bilateral: Secondary | ICD-10-CM | POA: Diagnosis not present

## 2017-09-23 DIAGNOSIS — R2681 Unsteadiness on feet: Secondary | ICD-10-CM

## 2017-09-23 NOTE — Patient Instructions (Addendum)
  (  Exercise) Monday Tuesday Wednesday Thursday Friday Saturday Sunday  Lying down: [employer] for dizziness           Abdominal: single leg bend            Sidelying leg lift          Seated: Marching            Modified sit up            "E" exercise            Forward bend            Chin tuck                           (Exercise) Monday Tuesday Wednesday Thursday Friday Saturday Sunday   Standing: Sit to stand           Standing step out and in           [employer] exercises
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
## 2017-10-01 ENCOUNTER — Encounter: Payer: Self-pay | Admitting: Physical Therapy

## 2017-10-01 ENCOUNTER — Ambulatory Visit: Payer: Medicare Other | Attending: Family Medicine | Admitting: Physical Therapy

## 2017-10-01 DIAGNOSIS — M6281 Muscle weakness (generalized): Secondary | ICD-10-CM | POA: Diagnosis not present

## 2017-10-01 DIAGNOSIS — R29818 Other symptoms and signs involving the nervous system: Secondary | ICD-10-CM | POA: Diagnosis not present

## 2017-10-01 DIAGNOSIS — R2681 Unsteadiness on feet: Secondary | ICD-10-CM | POA: Diagnosis not present

## 2017-10-01 NOTE — Therapy (Signed)
[facility] [address] [place], [place], [address] Phone: [phone]   Fax:  [phone]  Physical Therapy Treatment  Patient Details  Name: Claudia Perez MRN: [id number] Date of Birth: 03-24-1951 Referring Provider: Alger Simons MD  Encounter Date: 10/01/2017      PT End of Session - 10/01/17 2015    Visit Number 18   Number of Visits 21   Date for PT Re-Evaluation 10/23/17   Authorization Type Medicare, BCBS secondary   Authorization Time Period 07/26/17  to 09/24/17   PT Start Time 1103   PT Stop Time 1147   PT Time Calculation (min) 44 min   Equipment Utilized During Treatment Gait belt   Activity Tolerance Patient tolerated treatment well   Behavior During Therapy [facility] for tasks assessed/performed      Past Medical History:  Diagnosis Date  . Abnormality of gait   . Fall at home 03/27/2013   "lost consciousness; fractured right clavicle" (03/27/2013)  . GERD (gastroesophageal reflux disease)   . Hypercholesteremia   . Hyperthyroidism    "a little bit; don't take RX for it" (03/27/2013)  . Mobility impaired   . Other closed skull fracture without mention of intracranial injury, unspecified state of consciousness   . Seizures ([place])    "think I might have; been blacking out several times in the past" (03/27/2013)  . Shortness of breath    "even now, just talking" (03/27/2013)  . Subdural hemorrhage ([place])   . Vitamin D deficiency     Past Surgical History:  Procedure Laterality Date  . APPENDECTOMY  1960's  . BRAIN SURGERY  10/2003   SDH evac  . EYE SURGERY  02/2013   cataract extraction w/ intraocular lens implant    There were no vitals filed for this visit.      Subjective Assessment - 10/01/17 1112    Subjective Knows she is improving and plans to continue to work hard at home. "I want to get better that's why I do this." Reports no more spinning when doing her rolling habituation.   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 No/denies                         [facility] Adult PT Treatment/Exercise - 10/01/17 1133      Transfers   Transfers Sit to Stand;Stand to Sit   Sit to Stand 6: Modified independent (Device/Increase time)   Stand to Sit 6: Modified independent (Device/Increase time)   Transfer Cueing no cues for sequencing needed; improved stability upon standing     Ambulation/Gait   Ambulation/Gait Assistance 4: Min guard   Ambulation/Gait Assistance Details pt had obtained a new RW however it is 1.5 inches too tall for her and cannot be adjusted lower; informed pt and husband they should try to return for the proper RW; with clinic shorter RW pt able to maintain proper distance and upright posture; continues with ataxia and occaional imbalance.    Ambulation Distance (Feet) 100 Feet  40, 40, 100   Assistive device Rolling walker   Gait Pattern Step-through pattern;Decreased stride length;Right foot flat;Left foot flat;Shuffle;Ataxic;Wide base of support;Poor foot clearance - left;Poor foot clearance - right;Lateral hip instability   Ambulation Surface Indoor     Exercises  Other Exercises  talling kneeling on mat table with blue peanut in front for pt to use UEs for support; 10 squats with good control             Balance Exercises - 10/01/17 2011      Balance Exercises: Standing   Tandem Gait Forward;Upper extremity support;2 reps   Partial Tandem Stance Eyes open;Intermittent upper extremity support;4 reps   Retro Gait 4 reps   Sidestepping Upper extremity support;4 reps           PT Education - 10/01/17 2012    Education provided Yes   Education Details Improved strength overall, especially notable since previous visits.    Person(s) Educated Patient   Methods  Explanation;Demonstration   Comprehension Verbalized understanding;Returned demonstration          PT Short Term Goals - 08/26/17 1209      PT SHORT TERM GOAL #1   Title Patient will be asymptomatic with BPPV assessments.    Baseline 9/24 +for BPPV lt posterior canal and Lt horizontal canal; 9/26 roll test no nystagmus    Time 4   Period Weeks   Status Achieved     PT SHORT TERM GOAL #2   Title Patient will complete TUG and gait velocity with goals set (as appropriate).    Time 1   Period Weeks   Status Not Met     PT SHORT TERM GOAL #3   Title Patient will be independent with HEP for strengthening and balance.    Baseline 9/27 independent with HEP, however primarily addressing vestibular rehab and balance. As this improves, will incorporate more strengthening exercises   Time 4   Status Partially Met     PT SHORT TERM GOAL #4   Title Patient will ambulate 60 ft without standing rest break with RW and supervision.    Baseline 9/24 stopped 3 times in 60 ft, each time due to imbalance andd stops to recover her balance (not due to fatigue); 9/27 again stops while walkinng to recover her balance, not due to fatigue   Time 4   Period Weeks   Status Deferred           PT Long Term Goals - 09/23/17 2023      PT LONG TERM GOAL #1   Title Patient will be independent with updated HEP    Time 8   Period Weeks   Status Achieved     PT LONG TERM GOAL #2   Title Patient will ambulate modified independent 100 ft with RW without standing rest break. (new target 10/20/17)   Baseline 10/25 Patient can walk 100 ft with minguard to supervision with stops only for regaining balance;    Time 8   Period Weeks   Status On-going     PT LONG TERM GOAL #3   Title Patient's gait velocity will improve by 10% over measure at 4 weeks (as a measure of decreased fall risk). (new target 10/20/17)   Baseline 09/23/17  0.66 ft/sec   Time 8   Period Weeks   Status On-going     PT LONG TERM  GOAL #4   Title Patient's Functional Status score on FOTO will improve to >30 as a measure of improved function and patient's confidence in her ability.   Baseline 09/23/17 FS 49   Time 8   Period Weeks   Status Achieved               Plan -  10/01/17 2016    Clinical Impression Statement Session focused on balance, strength and coordination. Patient did well with tall kneeling exercises and balance, including getting on/off mat table with min/mod assist. 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   PT Frequency 2x / week   PT Duration 8 weeks   PT Treatment/Interventions ADLs/Self Care Home Management;Canalith Repostioning;DME Instruction;Gait training;Stair training;Functional mobility training;Therapeutic activities;Therapeutic exercise;Balance training;Neuromuscular re-education;Patient/family education;Energy conservation;Vestibular   PT Next Visit Plan ?exchanged her RW for one short enough; strengthening; balance; pre-gait and gait training for incr wt-shift, step length, heelstrike, use short clinic RW until she gets her own RW her size   Consulted and Agree with Plan of Care Patient      Patient will benefit from skilled therapeutic intervention in order to improve the following deficits and impairments:  Abnormal gait, Decreased activity tolerance, Decreased balance, Decreased coordination, Decreased endurance, Decreased knowledge of use of DME, Decreased mobility, Decreased strength, Dizziness, Impaired UE functional use  Visit Diagnosis: Unsteady gait  Muscle weakness (generalized)  Other symptoms and signs involving the nervous system     Problem List Patient Active Problem List   Diagnosis Date Noted  . Overactive bladder 08/18/2017  . Faintness   . Left rotator cuff tear 02/01/2015  . Closed left humeral fracture 12/07/2014  . Fatigue  12/07/2014  . 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 Dystonia 08/10/2014  . Syncope and collapse 06/08/2014  . Orthostatic hypotension 06/08/2014  . 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 Fall down stairs 12/11/2013  . Basilar skull fracture ([place]) 12/11/2013  . Traumatic intracranial hemorrhage ([place]) 12/11/2013  . Back pain 12/11/2013  . Subarachnoid hemorrhage following injury ([place]) 12/09/2013  . Syncope 03/27/2013  . Clavicular fracture 03/27/2013  . COUGH 12/28/2007    Rexanne Mano, PT 10/01/2017, 8:22 PM  [facility] [address] [place], [place], [address] Phone: [phone]   Fax:  [phone]  Name: Hani Patnode MRN: [id number] Date of Birth: October 02, 1951

## 2017-10-08 ENCOUNTER — Encounter: Payer: Self-pay | Admitting: Physical Therapy

## 2017-10-08 ENCOUNTER — Ambulatory Visit: Payer: Medicare Other | Admitting: Physical Therapy

## 2017-10-08 DIAGNOSIS — R2681 Unsteadiness on feet: Secondary | ICD-10-CM

## 2017-10-08 DIAGNOSIS — R29818 Other symptoms and signs involving the nervous system: Secondary | ICD-10-CM | POA: Diagnosis not present

## 2017-10-08 DIAGNOSIS — M6281 Muscle weakness (generalized): Secondary | ICD-10-CM | POA: Diagnosis not present

## 2017-10-08 NOTE — Therapy (Signed)
[facility] [address] [place], [place], [address] Phone: [phone]   Fax:  [phone]  Physical Therapy Treatment  Patient Details  Name: Claudia Perez MRN: [id number] Date of Birth: 1951/09/24 Referring Provider: Alger Simons MD   Encounter Date: 10/08/2017  PT End of Session - 10/08/17 1246    Visit Number  19    Number of Visits  21    Date for PT Re-Evaluation  10/23/17    Authorization Type  Medicare, BCBS secondary    Authorization Time Period  07/26/17  to 09/24/17    PT Start Time  1146    PT Stop Time  1230    PT Time Calculation (min)  44 min    Equipment Utilized During Treatment  Gait belt    Activity Tolerance  Patient tolerated treatment well    Behavior During Therapy  [facility] for tasks assessed/performed       Past Medical History:  Diagnosis Date  . Abnormality of gait   . Fall at home 03/27/2013   "lost consciousness; fractured right clavicle" (03/27/2013)  . GERD (gastroesophageal reflux disease)   . Hypercholesteremia   . Hyperthyroidism    "a little bit; don't take RX for it" (03/27/2013)  . Mobility impaired   . Other closed skull fracture without mention of intracranial injury, unspecified state of consciousness   . Seizures ([place])    "think I might have; been blacking out several times in the past" (03/27/2013)  . Shortness of breath    "even now, just talking" (03/27/2013)  . Subdural hemorrhage ([place])   . Vitamin D deficiency     Past Surgical History:  Procedure Laterality Date  . APPENDECTOMY  1960's  . BRAIN SURGERY  10/2003   SDH evac  . EYE SURGERY  02/2013   cataract extraction w/ intraocular lens implant    There were no vitals filed for this visit.  Subjective Assessment - 10/08/17 1153    Subjective  Has been working on her exercises "vigorously" since only coming one time per week to PT.    Pertinent History  Left posterior parietal and right inferior frontal lobe  old infarcts, vertigo,     Patient Stated Goals  be able to walk with her walker on her own; cure the vertigo    Currently in Pain?  No/denies         Treatment- emphasized trunk control, strengthening, and balance with sit to stand on blue airex foam (mod assist to maintian balance), minisquats on blue airex no UE support), sitting on green physioball with wt-shifing and moving UEs, and tall kneeling on mat with hands on peanut/ball with partial squats. Seated with large ball behind her at level of shoulder blades pushing back into ball while maintianing tight abdominals.   New RW she obtained adjusted to fit her (remains slightly tall for her,but far better). Ambulation emphasizing trunk control (not allowing her hips to shift forward and "hand" on her Y ligaments or knees to snap backwards into recurvatum. 100 ft x 2                       PT Short Term Goals - 08/26/17 1209      PT SHORT TERM GOAL #1   Title  Patient will be asymptomatic with BPPV assessments.     Baseline  9/24 +for BPPV lt posterior canal and Lt horizontal canal; 9/26 roll test no nystagmus  Time  4    Period  Weeks    Status  Achieved      PT SHORT TERM GOAL #2   Title  Patient will complete TUG and gait velocity with goals set (as appropriate).     Time  1    Period  Weeks    Status  Not Met      PT SHORT TERM GOAL #3   Title  Patient will be independent with HEP for strengthening and balance.     Baseline  9/27 independent with HEP, however primarily addressing vestibular rehab and balance. As this improves, will incorporate more strengthening exercises    Time  4    Status  Partially Met      PT SHORT TERM GOAL #4   Title  Patient will ambulate 60 ft without standing rest break with RW and supervision.     Baseline  9/24 stopped 3 times in 60 ft, each time due to imbalance andd stops to recover her balance (not due to fatigue); 9/27 again stops while walkinng to recover her balance,  not due to fatigue    Time  4    Period  Weeks    Status  Deferred        PT Long Term Goals - 09/23/17 2023      PT LONG TERM GOAL #1   Title  Patient will be independent with updated HEP     Time  8    Period  Weeks    Status  Achieved      PT LONG TERM GOAL #2   Title  Patient will ambulate modified independent 100 ft with RW without standing rest break. (new target 10/20/17)    Baseline  10/25 Patient can walk 100 ft with minguard to supervision with stops only for regaining balance;     Time  8    Period  Weeks    Status  On-going      PT LONG TERM GOAL #3   Title  Patient's gait velocity will improve by 10% over measure at 4 weeks (as a measure of decreased fall risk). (new target 10/20/17)    Baseline  09/23/17  0.66 ft/sec    Time  8    Period  Weeks    Status  On-going      PT LONG TERM GOAL #4   Title  Patient's Functional Status score on FOTO will improve to >30 as a measure of improved function and patient's confidence in her ability.    Baseline  09/23/17 FS 49    Time  8    Period  Weeks    Status  Achieved            Plan - 10/08/17 1251    Clinical Impression Statement  Session focused on balance, strengthening, coordination, and gait. She continues to improve and tolerated far more acivity before requiring a rest break than previously. Anticipate patient wll be ready for discharge at end of this certification with solid HEP to continue on her own.     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    PT Frequency  2x / week    PT Duration  8 weeks    PT Treatment/Interventions  ADLs/Self Care Home Management;Canalith Repostioning;DME Instruction;Gait training;Stair training;Functional mobility training;Therapeutic activities;Therapeutic exercise;Balance training;Neuromuscular re-education;Patient/family education;Energy conservation;Vestibular    PT Next Visit Plan  strengthening  core;  balance; pre-gait and gait training for incr wt-shift, step length, heelstrike, use short clinic RW until she gets her own RW her size    Consulted and Agree with Plan of Care  Patient       Patient will benefit from skilled therapeutic intervention in order to improve the following deficits and impairments:  Abnormal gait, Decreased activity tolerance, Decreased balance, Decreased coordination, Decreased endurance, Decreased knowledge of use of DME, Decreased mobility, Decreased strength, Dizziness, Impaired UE functional use  Visit Diagnosis: Unsteady gait  Muscle weakness (generalized)  Other symptoms and signs involving the nervous system     Problem List Patient Active Problem List   Diagnosis Date Noted  . Overactive bladder 08/18/2017  . Faintness   . Left rotator cuff tear 02/01/2015  . Closed left humeral fracture 12/07/2014  . Fatigue 12/07/2014  . 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 Dystonia 08/10/2014  . Syncope and collapse 06/08/2014  . Orthostatic hypotension 06/08/2014  . 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 Fall down stairs 12/11/2013  . Basilar skull fracture ([place]) 12/11/2013  . Traumatic intracranial hemorrhage ([place]) 12/11/2013  . Back pain 12/11/2013  . Subarachnoid hemorrhage following injury ([place]) 12/09/2013  . Syncope 03/27/2013  . Clavicular fracture 03/27/2013  . COUGH 12/28/2007    Rexanne Mano, PT 10/08/2017, 12:55 PM  [facility] [address] [place], [place], [address] Phone: [phone]   Fax:  [phone]  Name: Claudia Perez MRN: [id number] Date of Birth: 12-05-1950

## 2017-10-13 DIAGNOSIS — R1310 Dysphagia, unspecified: Secondary | ICD-10-CM | POA: Diagnosis not present

## 2017-10-15 ENCOUNTER — Other Ambulatory Visit (HOSPITAL_COMMUNITY): Payer: Self-pay | Admitting: Gastroenterology

## 2017-10-15 ENCOUNTER — Ambulatory Visit: Payer: Medicare Other | Admitting: Physical Therapy

## 2017-10-15 DIAGNOSIS — R1319 Other dysphagia: Secondary | ICD-10-CM

## 2017-10-18 ENCOUNTER — Encounter: Payer: Self-pay | Admitting: Physical Therapy

## 2017-10-18 ENCOUNTER — Ambulatory Visit: Payer: Medicare Other | Admitting: Physical Therapy

## 2017-10-18 DIAGNOSIS — R29818 Other symptoms and signs involving the nervous system: Secondary | ICD-10-CM

## 2017-10-18 DIAGNOSIS — M6281 Muscle weakness (generalized): Secondary | ICD-10-CM

## 2017-10-18 DIAGNOSIS — R2681 Unsteadiness on feet: Secondary | ICD-10-CM

## 2017-10-18 NOTE — Therapy (Signed)
[facility] [address] [place], [place], [address] Phone: [phone]   Fax:  [phone]  Physical Therapy Treatment and Discharge Summary  Patient Details  Name: Claudia Perez MRN: [id number] Date of Birth: [birth year] Referring Provider: Alger Simons MD   Encounter Date: 10/18/2017  PT End of Session - 10/18/17 1153    Visit Number  20    Number of Visits  21    Date for PT Re-Evaluation  10/23/17    Authorization Type  Medicare, BCBS secondary    Authorization Time Period  07/26/17  to 09/24/17    PT Start Time  1100    PT Stop Time  1142    PT Time Calculation (min)  42 min    Activity Tolerance  Patient tolerated treatment well    Behavior During Therapy  [facility] for tasks assessed/performed       Past Medical History:  Diagnosis Date  . Abnormality of gait   . Fall at home 03/27/2013   "lost consciousness; fractured right clavicle" (03/27/2013)  . GERD (gastroesophageal reflux disease)   . Hypercholesteremia   . Hyperthyroidism    "a little bit; don't take RX for it" (03/27/2013)  . Mobility impaired   . Other closed skull fracture without mention of intracranial injury, unspecified state of consciousness   . Seizures ([place])    "think I might have; been blacking out several times in the past" (03/27/2013)  . Shortness of breath    "even now, just talking" (03/27/2013)  . Subdural hemorrhage ([place])   . Vitamin D deficiency     Past Surgical History:  Procedure Laterality Date  . APPENDECTOMY  1960's  . BRAIN SURGERY  10/2003   SDH evac  . EYE SURGERY  02/2013   cataract extraction w/ intraocular lens implant    There were no vitals filed for this visit.  Subjective Assessment - 10/18/17 1100    Subjective  I'll be going for a swallowing exam soon. I might be back for swallowing therapy.     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  No/denies                      [facility] Adult PT Treatment/Exercise - 10/18/17 1116      Bed Mobility   Supine to Sit  3: Mod assist    Sit to Supine  4: Min assist    Sit to Supine - Details (indicate cue type and reason)  from side sitting on mat table; assist to control descent      Transfers   Transfers  Sit to Stand;Stand to Sit    Sit to Stand  6: Modified independent (Device/Increase time)    Stand to Sit  6: Modified independent (Device/Increase time)    Transfer Cueing  cues only to try to descend more slowly for leg strengthening; does not use backs of legs for ascend or descend      Ambulation/Gait   Ambulation/Gait  Yes    Ambulation/Gait Assistance  5: Supervision;6: Modified independent (Device/Increase time)    Ambulation/Gait Assistance Details  stops briefly to maintain her balance (if begins to go too fast)    Ambulation Distance (Feet)  120 Feet 100, 120    Assistive device  Rolling walker    Gait Pattern  Step-through pattern;Decreased stride length;Right foot flat;Left foot flat;Shuffle;Ataxic;Wide base of support;Poor foot clearance - left;Poor foot clearance - right    Ambulation Surface  Indoor    Gait velocity  20 ft/22.53 sec= 0.89 ft/sec      Knee/Hip Exercises: Standing   Forward Step Up  Both;2 sets;5 reps;Hand Hold: 2;Step Height: 6" minguard assist for safety; no LOB    Other Standing Knee Exercises  tall kneeling on mat table with bil UE on blue peanut; pushing it forward with hip flexion and then bringing it back with return to tall kneeling and maintain balance             PT Education - 10/18/17 1152    Education provided  Yes    Education Details  results of measures; continue her exercises on discharge to maintain her gains!    Person(s) Educated  Patient    Methods  Explanation    Comprehension  Verbalized understanding        PT Short Term Goals - 08/26/17 1209      PT SHORT TERM GOAL #1   Title  Patient will be asymptomatic with BPPV assessments.     Baseline  9/24 +for BPPV lt posterior canal and Lt horizontal canal; 9/26 roll test no nystagmus     Time  4    Period  Weeks    Status  Achieved      PT SHORT TERM GOAL #2   Title  Patient will complete TUG and gait velocity with goals set (as appropriate).     Time  1    Period  Weeks    Status  Not Met      PT SHORT TERM GOAL #3   Title  Patient will be independent with HEP for strengthening and balance.     Baseline  9/27 independent with HEP, however primarily addressing vestibular rehab and balance. As this improves, will incorporate more strengthening exercises    Time  4    Status  Partially Met      PT SHORT TERM GOAL #4   Title  Patient will ambulate 60 ft without standing rest break with RW and supervision.     Baseline  9/24 stopped 3 times in 60 ft, each time due to imbalance andd stops to recover her balance (not due to fatigue); 9/27 again stops while walkinng to recover her balance, not due to fatigue    Time  4    Period  Weeks    Status  Deferred        PT Long Term Goals - 10/18/17 1158      PT LONG TERM GOAL #1   Title  Patient will be independent with updated HEP     Time  8    Period  Weeks    Status  Achieved      PT LONG TERM GOAL #2   Title  Patient will ambulate modified independent 100 ft with RW without standing rest break. (new target 10/20/17)    Baseline  10/25 Patient can walk 100 ft with minguard to supervision with stops only for regaining balance; 10/18/17 pt walking 120 ft with pauses to regain balance (not for resting) supervision/modified independent    Time  8    Period  Weeks    Status  Partially Met      PT LONG TERM GOAL #3   Title  Patient's gait velocity will improve by  10% over measure at 4 weeks (as a measure of decreased fall risk). (new target 10/20/17)    Baseline  09/23/17  0.66 ft/sec  02-Nov-2017 0.89 ft/sec  (0.73 ft/sec would have been 10 % improvement)    Time  8    Period  Weeks    Status  Achieved      PT LONG TERM GOAL #4   Title  Patient's Functional Status score on FOTO will improve to >30 as a measure of improved function and patient's confidence in her ability.    Baseline  09/23/17 FS 49    Time  8    Period  Weeks    Status  Achieved            Plan - 11/02/2017 1201    Clinical Impression Statement  LTGs assessed with pt meeting 3 of 4 goals and partially meeting the 4th goal (made progress towards goal, but not at goal level). Patient very appreciative of education in HEP and plans to continue to do her HEP as she reports she can tell a big difference in how she moves. She reported no falls during the past 3 months! Patient is ready for discharge from PT.     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    PT Frequency  2x / week    PT Duration  8 weeks    PT Treatment/Interventions  ADLs/Self Care Home Management;Canalith Repostioning;DME Instruction;Gait training;Stair training;Functional mobility training;Therapeutic activities;Therapeutic exercise;Balance training;Neuromuscular re-education;Patient/family education;Energy conservation;Vestibular    Consulted and Agree with Plan of Care  Patient       Patient will benefit from skilled therapeutic intervention in order to improve the following deficits and impairments:  Abnormal gait, Decreased activity tolerance, Decreased balance, Decreased coordination, Decreased endurance, Decreased knowledge of use of DME, Decreased mobility, Decreased strength, Dizziness, Impaired UE functional use  Visit Diagnosis: Unsteady gait  Muscle weakness (generalized)  Other symptoms and signs involving the nervous system   G-Codes - 11/02/17 1204    Functional Assessment Tool Used (Outpatient Only)  2017/11/02 ambulates 120 ft with RW and  supervision; gait velocity 0.89 ft/sec    Functional Limitation  Mobility: Walking and moving around    Mobility: Walking and Moving Around Goal Status [phone])  At least 40 percent but less than 60 percent impaired, limited or restricted    Mobility: Walking and Moving Around Discharge Status [phone])  At least 40 percent but less than 60 percent impaired, limited or restricted       Problem List Patient Active Problem List   Diagnosis Date Noted  . Overactive bladder 08/18/2017  . Faintness   . Left rotator cuff tear 02/01/2015  . Closed left humeral fracture 12/07/2014  . Fatigue 12/07/2014  . 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 Dystonia 08/10/2014  . Syncope and collapse 06/08/2014  . Orthostatic hypotension 06/08/2014  . 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 Fall down stairs 12/11/2013  . Basilar skull fracture ([place]) 12/11/2013  . Traumatic intracranial hemorrhage ([place]) 12/11/2013  . Back pain 12/11/2013  . Subarachnoid hemorrhage following injury ([place]) 12/09/2013  . Syncope 03/27/2013  . Clavicular fracture 03/27/2013  . COUGH 12/28/2007   PHYSICAL THERAPY DISCHARGE SUMMARY  Visits from Start of Care: 20  Current functional level related to goals / functional outcomes: Modified independent with RW for transfers; supervision for gait with RW x 120 ft   Remaining deficits: Ataxia impairs her balance and mobility   Education / Equipment: HEP  Plan: Patient agrees to discharge.  Patient goals were partially met. Patient is being discharged due to being pleased with the current functional level.  ?????       Rexanne Mano, PT 10/18/2017, 12:07 PM  [facility] [address] [place], [place], [address] Phone: [phone]   Fax:   [phone]  Name: Claudia Perez MRN: [id number] Date of Birth: Dec 15, 1950

## 2017-10-19 DIAGNOSIS — R634 Abnormal weight loss: Secondary | ICD-10-CM | POA: Diagnosis not present

## 2017-10-19 DIAGNOSIS — Z8679 Personal history of other diseases of the circulatory system: Secondary | ICD-10-CM | POA: Diagnosis not present

## 2017-10-19 DIAGNOSIS — E7800 Pure hypercholesterolemia, unspecified: Secondary | ICD-10-CM | POA: Diagnosis not present

## 2017-10-19 DIAGNOSIS — R1310 Dysphagia, unspecified: Secondary | ICD-10-CM | POA: Diagnosis not present

## 2017-10-19 DIAGNOSIS — E559 Vitamin D deficiency, unspecified: Secondary | ICD-10-CM | POA: Diagnosis not present

## 2017-10-19 DIAGNOSIS — R2681 Unsteadiness on feet: Secondary | ICD-10-CM | POA: Diagnosis not present

## 2017-10-22 ENCOUNTER — Ambulatory Visit (HOSPITAL_COMMUNITY)
Admission: RE | Admit: 2017-10-22 | Discharge: 2017-10-22 | Disposition: A | Discharge status: Home / Self Care | Payer: Medicare Other | Source: Ambulatory Visit | Attending: Gastroenterology | Admitting: Gastroenterology

## 2017-10-22 DIAGNOSIS — R05 Cough: Secondary | ICD-10-CM | POA: Diagnosis not present

## 2017-10-22 DIAGNOSIS — E7800 Pure hypercholesterolemia, unspecified: Secondary | ICD-10-CM | POA: Diagnosis not present

## 2017-10-22 DIAGNOSIS — Z87820 Personal history of traumatic brain injury: Secondary | ICD-10-CM | POA: Insufficient documentation

## 2017-10-22 DIAGNOSIS — E0590 Thyrotoxicosis, unspecified without thyrotoxic crisis or storm: Secondary | ICD-10-CM | POA: Diagnosis not present

## 2017-10-22 DIAGNOSIS — K219 Gastro-esophageal reflux disease without esophagitis: Secondary | ICD-10-CM | POA: Insufficient documentation

## 2017-10-22 DIAGNOSIS — R1319 Other dysphagia: Secondary | ICD-10-CM | POA: Diagnosis not present

## 2017-10-22 DIAGNOSIS — E559 Vitamin D deficiency, unspecified: Secondary | ICD-10-CM | POA: Insufficient documentation

## 2017-10-22 DIAGNOSIS — R1310 Dysphagia, unspecified: Secondary | ICD-10-CM | POA: Diagnosis not present

## 2017-10-22 NOTE — Progress Notes (Signed)
Modified Barium Swallow Progress Note  Patient Details  Name: Claudia Perez MRN: [id number] Date of Birth: June 04, 1951  Today's Date: 10/22/2017  Modified Barium Swallow completed.  Full report located under Chart Review in the Imaging Section.  Brief recommendations include the following:  Clinical Impression  Pt demonstrates swallow function consistent with baseline function documented 4 years ago. There is slightly decreased laryngeal cosure at times leading to trace flash and frank penetraiton events. Also trace pyriform residual noted. Pt clears most of this residue and penetrate with a spontaneous second swallow. One very large sip resulted in trace aspiration with slight delay in cough response to eject majority of aspirate. Pt instructed to use her adaptive cup to take smaller more controlled sips and use a preventative cough to clear any penetrate. Overall, expect pt to continue to tolerate diet. Of note, pt was observed to have slow transit of puree and solids through the cervical esophagus though esopahgeal sweep with pill revealed rapid transit through GE junction. No SLP f/u needed.    Swallow Evaluation Recommendations       SLP Diet Recommendations: Regular solids;Thin liquid   Liquid Administration via: Cup   Medication Administration: Whole meds with puree   Supervision: Patient able to self feed   Compensations: Slow rate;Small sips/bites;Multiple dry swallows after each bite/sip;Hard cough after swallow   Postural Changes: Remain semi-upright after after feeds/meals (Comment)   Oral Care Recommendations: Oral care BID       Herbie Baltimore, MA CCC-SLP [phone]  Schneur Crowson, Katherene Ponto 10/22/2017,12:18 PM

## 2017-10-26 DIAGNOSIS — R634 Abnormal weight loss: Secondary | ICD-10-CM | POA: Diagnosis not present

## 2017-10-26 DIAGNOSIS — G241 Genetic torsion dystonia: Secondary | ICD-10-CM | POA: Diagnosis not present

## 2017-10-26 DIAGNOSIS — K219 Gastro-esophageal reflux disease without esophagitis: Secondary | ICD-10-CM | POA: Diagnosis not present

## 2017-10-26 DIAGNOSIS — E559 Vitamin D deficiency, unspecified: Secondary | ICD-10-CM | POA: Diagnosis not present

## 2017-10-26 DIAGNOSIS — R296 Repeated falls: Secondary | ICD-10-CM | POA: Diagnosis not present

## 2017-10-26 DIAGNOSIS — E7800 Pure hypercholesterolemia, unspecified: Secondary | ICD-10-CM | POA: Diagnosis not present

## 2017-10-26 DIAGNOSIS — R1310 Dysphagia, unspecified: Secondary | ICD-10-CM | POA: Diagnosis not present

## 2017-10-26 DIAGNOSIS — R2681 Unsteadiness on feet: Secondary | ICD-10-CM | POA: Diagnosis not present

## 2017-11-17 ENCOUNTER — Encounter: Payer: Medicare Other | Attending: Physical Medicine & Rehabilitation | Admitting: Physical Medicine & Rehabilitation

## 2017-11-17 ENCOUNTER — Encounter: Payer: Self-pay | Admitting: Physical Medicine & Rehabilitation

## 2017-11-17 VITALS — BP 123/75 | HR 63

## 2017-11-17 DIAGNOSIS — E0590 Thyrotoxicosis, unspecified without thyrotoxic crisis or storm: Secondary | ICD-10-CM | POA: Diagnosis not present

## 2017-11-17 DIAGNOSIS — K219 Gastro-esophageal reflux disease without esophagitis: Secondary | ICD-10-CM | POA: Diagnosis not present

## 2017-11-17 DIAGNOSIS — E559 Vitamin D deficiency, unspecified: Secondary | ICD-10-CM | POA: Insufficient documentation

## 2017-11-17 DIAGNOSIS — M25562 Pain in left knee: Secondary | ICD-10-CM

## 2017-11-17 DIAGNOSIS — S069X2S Unspecified intracranial injury with loss of consciousness of 31 minutes to 59 minutes, sequela: Secondary | ICD-10-CM | POA: Insufficient documentation

## 2017-11-17 DIAGNOSIS — F329 Major depressive disorder, single episode, unspecified: Secondary | ICD-10-CM | POA: Diagnosis not present

## 2017-11-17 DIAGNOSIS — E7800 Pure hypercholesterolemia, unspecified: Secondary | ICD-10-CM | POA: Insufficient documentation

## 2017-11-17 DIAGNOSIS — M25561 Pain in right knee: Secondary | ICD-10-CM

## 2017-11-17 DIAGNOSIS — M84422A Pathological fracture, left humerus, initial encounter for fracture: Secondary | ICD-10-CM | POA: Insufficient documentation

## 2017-11-17 DIAGNOSIS — G8929 Other chronic pain: Secondary | ICD-10-CM | POA: Diagnosis not present

## 2017-11-17 DIAGNOSIS — R5383 Other fatigue: Secondary | ICD-10-CM | POA: Diagnosis not present

## 2017-11-17 DIAGNOSIS — S0291XS Unspecified fracture of skull, sequela: Secondary | ICD-10-CM | POA: Diagnosis not present

## 2017-11-17 DIAGNOSIS — R2689 Other abnormalities of gait and mobility: Secondary | ICD-10-CM

## 2017-11-17 MED ORDER — DICLOFENAC SODIUM 1 % TD GEL: 1.0000 "application " | Freq: Three times a day (TID) | TRANSDERMAL | 4 refills | Status: DC

## 2017-11-17 NOTE — Patient Instructions (Addendum)
  NECK PAIN: EXERCISES AND STRETCHES EVERY DAY USE GOOD POSTURE HEATING PAD VOLTAREN GEL  TYLENOL    PLEASE FEEL FREE TO CALL OUR OFFICE WITH ANY PROBLEMS OR QUESTIONS ([phone])  HAVE A HAPPY HOLIDAYS!                     ^                  ^^                ^ ^ ^             ^ ^ ^ ^ ^           ^ ^ ^ ^ ^ ^ ^        ^ ^ [place] Marland Kitchen                Marland Kitchen                Marland Kitchen                Marland Kitchen

## 2017-11-17 NOTE — Progress Notes (Signed)
Subjective:    Patient ID: Claudia Perez, female    DOB: 05/21/1951, 66 y.o.   MRN: [id number]  HPI   [name] is here in follow up of her chronic gait disorder. She has had good results with PT as far as her walking is concerned but the increased activity has caused pain in her neck and shoulders. She also complains of stiffness in these areas.  She is using some Tylenol which has not provided substantial relief.  She has not used a heating pad.  The myrbetriq helped her bladder. She is voiding much less frequently.    Pain Inventory Average Pain 8 Pain Right Now 8 My pain is dull  In the last 24 hours, has pain interfered with the following? General activity 10 Relation with others 10 Enjoyment of life 10 What TIME of day is your pain at its worst? daytime Sleep (in general) Good  Pain is worse with: walking, bending, sitting and standing Pain improves with: rest Relief from Meds: .  Mobility use a walker ability to climb steps?  no do you drive?  no  Function disabled: date disabled . I need assistance with the following:  dressing, bathing, toileting, meal prep, household duties and shopping  Neuro/Psych bladder control problems weakness numbness tremor tingling trouble walking dizziness loss of taste or smell  Prior Studies Any changes since last visit?  no  Physicians involved in your care Any changes since last visit?  no   Family History  Problem Relation Age of Onset  . Hypertension Mother   . Cirrhosis Brother    Social History   Socioeconomic History  . Marital status: Married    Spouse name: Not on file  . Number of children: Not on file  . Years of education: Not on file  . Highest education level: Not on file  Social Needs  . Financial resource strain: Not on file  . Food insecurity - worry: Not on file  . Food insecurity - inability: Not on file  . Transportation needs - medical: Not on file  . Transportation needs - non-medical:  Not on file  Occupational History  . Not on file  Tobacco Use  . Smoking status: Never Smoker  . Smokeless tobacco: Never Used  Substance and Sexual Activity  . Alcohol use: Yes    Comment: 03/27/2013 glass of wine "couple times/yr"  . Drug use: No  . Sexual activity: Not Currently  Other Topics Concern  . Not on file  Social History Narrative  . Not on file   Past Surgical History:  Procedure Laterality Date  . APPENDECTOMY  1960's  . BRAIN SURGERY  10/2003   SDH evac  . EYE SURGERY  02/2013   cataract extraction w/ intraocular lens implant   Past Medical History:  Diagnosis Date  . Abnormality of gait   . Fall at home 03/27/2013   "lost consciousness; fractured right clavicle" (03/27/2013)  . GERD (gastroesophageal reflux disease)   . Hypercholesteremia   . Hyperthyroidism    "a little bit; don't take RX for it" (03/27/2013)  . Mobility impaired   . Other closed skull fracture without mention of intracranial injury, unspecified state of consciousness   . Seizures ([place])    "think I might have; been blacking out several times in the past" (03/27/2013)  . Shortness of breath    "even now, just talking" (03/27/2013)  . Subdural hemorrhage ([place])   . Vitamin D deficiency    There were  no vitals taken for this visit.  Opioid Risk Score:   Fall Risk Score:  `1  Depression screen PHQ 2/9  No flowsheet data found.   Review of Systems  Constitutional: Positive for diaphoresis.  HENT: Negative.   Eyes: Negative.   Respiratory: Negative.   Cardiovascular: Negative.   Gastrointestinal: Negative.   Endocrine: Negative.   Genitourinary: Negative.   Musculoskeletal: Negative.   Skin: Negative.   Allergic/Immunologic: Negative.   Neurological: Negative.   Hematological: Negative.   Psychiatric/Behavioral: Negative.   All other systems reviewed and are negative.      Objective:   Physical Exam HENT:  Head: Normocephalic. No tenderness over frontal sinuses  Eyes:  Conjunctivae are normal. Pupils are equal, round, and reactive to light.  Neck: Normal range of motion.  Cardiovascular: RRR.  Respiratory: normal effort GI: Soft. Bowel sounds are normal. She exhibits no distension. There is no tenderness.  Musculoskeletal: left shoulder atrophy. Traps are tight. Head is forward. .  Neurological: She is alert and oriented to person, place, and time.  Speech fairly clear.  Gait is wide based. .  Strength 4/5 with left arm and leg a half-grade weaker than the right. fairly stable with rolling walker.  Still tends to shuffle feet and walk with a wide-based gait.  Psychiatric: She is pleasant as always.     Assessment & Plan:  Assessment/Plan:  1. 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 Multifactorial gait disorder/syncope--largely related to chronic effects of her TBI  -bppV---continue HEP, therapy             -Reviewed safety awareness today 3. Left humeral neck fracture- old 4. Continue celexa for depression  5. Fatigue/attention deficits:  -may be generally mood related. Has improved with resumption of celexa  6. Overactive bladder: improved with  myrbetriq 25mg  daily.  Continue 7. Myofascial pain/postural disorder:  Heat   -continue tylenol.  May take up to 3000 mg daily  -posture and ROM.  Needs to work on exercises daily   -voltaren gel trial.  Fifteen minutes of face to face patient care time were spent during this visit. All questions were encouraged and answered.  Follow up in about 3 months.

## 2017-12-03 DIAGNOSIS — E7800 Pure hypercholesterolemia, unspecified: Secondary | ICD-10-CM | POA: Diagnosis not present

## 2017-12-03 DIAGNOSIS — R634 Abnormal weight loss: Secondary | ICD-10-CM | POA: Diagnosis not present

## 2017-12-03 DIAGNOSIS — R1310 Dysphagia, unspecified: Secondary | ICD-10-CM | POA: Diagnosis not present

## 2017-12-03 DIAGNOSIS — E559 Vitamin D deficiency, unspecified: Secondary | ICD-10-CM | POA: Diagnosis not present

## 2017-12-06 DIAGNOSIS — E7800 Pure hypercholesterolemia, unspecified: Secondary | ICD-10-CM | POA: Diagnosis not present

## 2017-12-06 DIAGNOSIS — F329 Major depressive disorder, single episode, unspecified: Secondary | ICD-10-CM | POA: Diagnosis not present

## 2017-12-06 DIAGNOSIS — E559 Vitamin D deficiency, unspecified: Secondary | ICD-10-CM | POA: Diagnosis not present

## 2017-12-06 DIAGNOSIS — R2681 Unsteadiness on feet: Secondary | ICD-10-CM | POA: Diagnosis not present

## 2017-12-16 DIAGNOSIS — H6590 Unspecified nonsuppurative otitis media, unspecified ear: Secondary | ICD-10-CM | POA: Diagnosis not present

## 2017-12-16 DIAGNOSIS — R2681 Unsteadiness on feet: Secondary | ICD-10-CM | POA: Diagnosis not present

## 2017-12-16 DIAGNOSIS — R42 Dizziness and giddiness: Secondary | ICD-10-CM | POA: Diagnosis not present

## 2017-12-16 DIAGNOSIS — Z8679 Personal history of other diseases of the circulatory system: Secondary | ICD-10-CM | POA: Diagnosis not present

## 2017-12-22 ENCOUNTER — Ambulatory Visit: Payer: Medicare Other | Attending: Family Medicine | Admitting: Physical Therapy

## 2017-12-28 ENCOUNTER — Other Ambulatory Visit: Payer: Self-pay | Admitting: Physical Medicine & Rehabilitation

## 2017-12-28 DIAGNOSIS — N3281 Overactive bladder: Secondary | ICD-10-CM

## 2018-01-07 ENCOUNTER — Other Ambulatory Visit: Payer: Self-pay

## 2018-01-07 ENCOUNTER — Ambulatory Visit: Payer: Medicare Other | Attending: Family Medicine

## 2018-01-07 DIAGNOSIS — R1312 Dysphagia, oropharyngeal phase: Secondary | ICD-10-CM | POA: Diagnosis not present

## 2018-01-07 NOTE — Therapy (Signed)
[facility] [address] [place], [place], [address] Phone: [phone]   Fax:  [phone]  Speech Language Pathology Evaluation  Patient Details  Name: Claudia Perez MRN: [id number] Date of Birth: Jul 04, 1951 Referring Provider: Arta Silence, MD   Encounter Date: 01/07/2018  End of Session - 01/07/18 1208    Visit Number  1    Number of Visits  1    Date for SLP Re-Evaluation  01/07/18    SLP Start Time  0937    SLP Stop Time   1028    SLP Time Calculation (min)  51 min    Activity Tolerance  Patient tolerated treatment well       Past Medical History:  Diagnosis Date  . Abnormality of gait   . Fall at home 03/27/2013   "lost consciousness; fractured right clavicle" (03/27/2013)  . GERD (gastroesophageal reflux disease)   . Hypercholesteremia   . Hyperthyroidism    "a little bit; don't take RX for it" (03/27/2013)  . Mobility impaired   . Other closed skull fracture without mention of intracranial injury, unspecified state of consciousness   . Seizures ([place])    "think I might have; been blacking out several times in the past" (03/27/2013)  . Shortness of breath    "even now, just talking" (03/27/2013)  . Subdural hemorrhage ([place])   . Vitamin D deficiency     Past Surgical History:  Procedure Laterality Date  . APPENDECTOMY  1960's  . BRAIN SURGERY  10/2003   SDH evac  . EYE SURGERY  02/2013   cataract extraction w/ intraocular lens implant    There were no vitals filed for this visit.  Subjective Assessment - 01/07/18 0945    Subjective  "Most of the time the coughing is after I eat."    Currently in Pain?  No/denies         SLP Evaluation [facility] - 01/07/18 0945      SLP Visit Information   SLP Received On  01/07/18    Referring Provider  Arta Silence, MD    Onset Date  2004    Medical Diagnosis  S/P SDH, multiple falls      General Information   HPI  "Most of the time the coughing is  after I eat."      Balance Screen   Has the patient fallen in the past 6 months  Yes    How many times?  2    Has the patient had a decrease in activity level because of a fear of falling?   Yes    Is the patient reluctant to leave their home because of a fear of falling?   Yes      Prior Functional Status   Cognitive/Linguistic Baseline  Within functional limits      Cognition   Overall Cognitive Status  Within Functional Limits for tasks assessed      Auditory Comprehension   Overall Auditory Comprehension  Appears within functional limits for tasks assessed      Verbal Expression   Overall Verbal Expression  Appears within functional limits for tasks assessed      Oral Motor/Sensory Function   Overall Oral Motor/Sensory Function  Impaired    Labial ROM  Within Functional Limits    Labial Symmetry  Within Functional Limits    Labial Strength  Reduced    Labial Coordination  Reduced sluggish/ataxic    Lingual ROM  Within Functional Limits  Lingual Symmetry  Within Functional Limits    Lingual Strength  Reduced reduced more on rt than lt    Lingual Coordination  Reduced      Motor Speech   Overall Motor Speech  Impaired    Articulation  Impaired    Level of Impairment  Word    Intelligibility  Intelligibility reduced    Conversation  75-100% accurate approx 90%        Prior Functional Status - 01/07/18 1206      Prior Functional Status   Cognitive/Linguistic Baseline  Within functional limits    Type of Home  House     Lives With  Spouse;Family    Vocation  Retired          Thin Liquid - 01/07/18 1006      Thin Liquid   Presentation  Cup    Other Comments  larger sips produced louder swallows and spontaneous second swallows. Smaller sips produced swallows without noise, and no second swallow unless cued. Sounded as if pt had residue that was cleared with second (cued) swallow        Puree - 01/07/18 1025      Puree   Puree  Within functional limits     Presentation  Self Fed;Spoon      Solid - 01/07/18 1025      Solid   Solid  Within functional limits    Presentation  Self Fed    Other Comments  Pt ate entire cereal bar, and one saltine cracker wihtout overt s/s aspiration.          SLP Education - 01/07/18 1226    Education provided  Yes    Education Details  swallow precautions/aspiration precautions    Person(s) Educated  Patient    Methods  Explanation;Demonstration;Verbal cues;Handout    Comprehension  Verbalized understanding;Returned demonstration;Verbal cues required           Plan - 01/07/18 1208    Clinical Impression Statement  Pt presents today with [facility] swallowing ability as assessed with clinical exam following modified barium swallow exam in Nov 2018. Pt without overt s/s aspiration PNA to date, and none observed today. SLP reviewed precautions from that exam; pt did not demo following small sips/bites with POs today, until SLP cued her to do so. With larger sips, "clunk" in pt swallow was noted, suggesting incoordination in swallow reflex/response. Additionally, pt stated, "It feels like it stops right here (pointed to below thyroid cartilage)." When pt adhered to smaller sips, swallow did not exhibit abnormal noise and pt reported WNL feeling in pharynx/UES area. When smaller bites and sips were cued by SLP, and additional (cued) dry swallow were used, no overt s/s aspriation were seen. Pt was instructed to use small sips and bites, and to take additional swallow (dry) with all PO bites/sips. She return demo'd this for SLP x19 (cracker, H2O, applesauce, cereal bar boluses) with occasional min A faded to independent.     Speech Therapy Frequency  One time visit    Consulted and Agree with Plan of Care  Patient       Patient will benefit from skilled therapeutic intervention in order to improve the following deficits and impairments:   Oropharyngeal dysphagia    Problem List Patient Active Problem List    Diagnosis Date Noted  . Overactive bladder 08/18/2017  . Faintness   . Left rotator cuff tear 02/01/2015  . Closed left humeral fracture 12/07/2014  . Fatigue 12/07/2014  .  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 Dystonia 08/10/2014  . Syncope and collapse 06/08/2014  . Orthostatic hypotension 06/08/2014  . 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 Fall down stairs 12/11/2013  . Basilar skull fracture ([place]) 12/11/2013  . Traumatic intracranial hemorrhage ([place]) 12/11/2013  . Back pain 12/11/2013  . Subarachnoid hemorrhage following injury ([place]) 12/09/2013  . Syncope 03/27/2013  . Clavicular fracture 03/27/2013  . COUGH 12/28/2007    SCHINKE,CARL ,[place], CCC-SLP  01/07/2018, 12:27 PM  [facility] [address] [place], [place], [address] Phone: [phone]   Fax:  [phone]  Name: Naiyah Klostermann MRN: [id number] Date of Birth: July 23, 1951

## 2018-01-07 NOTE — Patient Instructions (Signed)
   When you eat: * Always take a second dry swallow with food and liquid!  * Keep small bites and small sips

## 2018-01-12 ENCOUNTER — Ambulatory Visit: Payer: Medicare Other | Admitting: Physical Therapy

## 2018-02-02 ENCOUNTER — Ambulatory Visit: Payer: Medicare Other | Attending: Family Medicine | Admitting: Physical Therapy

## 2018-02-02 DIAGNOSIS — M6281 Muscle weakness (generalized): Secondary | ICD-10-CM | POA: Insufficient documentation

## 2018-02-02 DIAGNOSIS — R278 Other lack of coordination: Secondary | ICD-10-CM | POA: Diagnosis not present

## 2018-02-02 DIAGNOSIS — R2689 Other abnormalities of gait and mobility: Secondary | ICD-10-CM | POA: Insufficient documentation

## 2018-02-02 DIAGNOSIS — R29818 Other symptoms and signs involving the nervous system: Secondary | ICD-10-CM

## 2018-02-03 ENCOUNTER — Institutional Professional Consult (permissible substitution): Payer: Medicare Other | Admitting: Neurology

## 2018-02-03 ENCOUNTER — Other Ambulatory Visit: Payer: Self-pay

## 2018-02-03 ENCOUNTER — Encounter: Payer: Self-pay | Admitting: Physical Therapy

## 2018-02-03 NOTE — Therapy (Signed)
[facility] [address] [place], [place], [address] Phone: [phone]   Fax:  [phone]  Physical Therapy Evaluation  Patient Details  Name: Claudia Perez MRN: [id number] Date of Birth: Oct 24, 1951 Referring Provider: Dr. Leighton Ruff   Encounter Date: 02/02/2018  PT End of Session - 02/03/18 1508    Visit Number  1    Number of Visits  1    Authorization Type  Medicare    Authorization Time Period  02-02-18 - 03-05-18    PT Start Time  1101    PT Stop Time  1217    PT Time Calculation (min)  76 min       Past Medical History:  Diagnosis Date  . Abnormality of gait   . Fall at home 03/27/2013   "lost consciousness; fractured right clavicle" (03/27/2013)  . GERD (gastroesophageal reflux disease)   . Hypercholesteremia   . Hyperthyroidism    "a little bit; don't take RX for it" (03/27/2013)  . Mobility impaired   . Other closed skull fracture without mention of intracranial injury, unspecified state of consciousness   . Seizures ([place])    "think I might have; been blacking out several times in the past" (03/27/2013)  . Shortness of breath    "even now, just talking" (03/27/2013)  . Subdural hemorrhage ([place])   . Vitamin D deficiency     Past Surgical History:  Procedure Laterality Date  . APPENDECTOMY  1960's  . BRAIN SURGERY  10/2003   SDH evac  . EYE SURGERY  02/2013   cataract extraction w/ intraocular lens implant    There were no vitals filed for this visit.   Subjective Assessment - 02/03/18 1503    Subjective  Pt amb. 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 Leighton Ruff    Onset Date/Surgical Date  -- 2004    Prior Therapy  Pt received OP PT in fall 2018      Precautions   Precautions  Fall      Restrictions   Weight Bearing Restrictions  No      Balance Screen   Has the patient fallen in the past 6 months  Yes    How many times?  4    Has the patient had a decrease in activity level because of a fear of falling?   Yes    Is the patient reluctant to leave their home because of a fear of falling?   Yes      [place]  Private residence    Living Arrangements  Spouse/significant other;Children daughter in high school    Available Help at Discharge  Family;Available PRN/intermittently husband works part-time (4 hrs/day)    Type of [place] Access  Level entry    Home Layout  One level    [place] - 2 wheels  Prior Function   Level of Independence  Needs assistance with ADLs;Needs assistance with gait             Objective measurements completed on examination: See above findings.       Mobility/Seating Evaluation    PATIENT INFORMATION: Name: Claudia Perez DOB: [birth year]  Sex: F Date seen: 02-02-18 Time: 1100  Address:  [place], [place] [address] Physician: Leighton Ruff, MD This evaluation/justification form will serve as the LMN for the following suppliers: __________________________ Supplier: [facility] Contact Person: Casper Harrison Phone:  [phone]   Seating Therapist: Guido Sander, PT Phone:   [phone]   Phone: [phone]    Spouse/Parent/Caregiver name: [name]  Phone number: [phone] Insurance/Payer: Medicare/Medicaid     Reason for Referral: power wheelchair evaluation  Patient/Caregiver Goals: obtain power wheelchair for independence and safety with mobility in  the home  Patient was seen for face-to-face evaluation for new power wheelchair.  Also present was [employer], [employer] to discuss recommendations and wheelchair options.  Further paperwork was completed and sent to vendor.  Patient appears to qualify for power mobility device at this time per objective findings.   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 2004 Diagnosis: ?????   []Progressive Disease Relevant past and future surgeries: Brain surgery - SDH evacuation Dec. 2004   Height: 5'2" Weight: 100# Explain recent changes or trends in weight: ?????   History including Falls: Pt reports 3 falls in past 6 months with most recent fall this am while walking outside    [place]: []House  []Condo/town home  [x]Apartment  []Assisted Living    []Lives Alone [x] Lives with Others                                                                                          Hours with caregiver: 67  [x]Home is accessible to patient           Stairs      []Yes [x] No     Ramp []Yes [x]No Comments:  ?????   COMMUNITY ADL: TRANSPORTATION: []Car    []Van    []Public Transportation    []Adapted w/c Lift    []Ambulance    [x]Other:       []Sits in wheelchair during transport  Employment/School: ????? Specific requirements pertaining to mobility ?????  Other: pt uses private vehicle for transportation (SUV); pt states she has applied for [employer]    FUNCTIONAL/SENSORY PROCESSING SKILLS:  Handedness:   [x]Right     []Left    []NA  Comments:  ?????  Functional Processing Skills for Wheeled Mobility [x]Processing Skills are adequate for safe wheelchair operation  Areas of concern than may interfere with safe operation of wheelchair Description of problem   [] Attention to environment      []Judgment      [] Hearing  [] Vision or visual processing      []Motor Planning  [] Fluctuations in  Behavior  ?????    VERBAL  COMMUNICATION: [x]WFL receptive [] WFL expressive [x]Understandable  []Difficult to understand  []non-communicative [] Uses an augmented communication device  CURRENT SEATING / MOBILITY: Current Mobility Base:  [x]None []Dependent []Manual []Scooter []Power  Type of Control: ?????  Manufacturer:  ?????Size:  ?????Age: ?????  Current Condition of Mobility Base:  ?????   Current Wheelchair components:  ?????  Describe posture in present seating system:  ?????      SENSATION and SKIN ISSUES: Sensation [x]Intact  []Impaired []Absent  Level of sensation: ????? Pressure Relief: Able to perform effective pressure relief :    []Yes  [x] No Method: ???? If not, Why?: ?????  Skin Issues/Skin Integrity Current Skin Issues  []Yes [x]No []Intact [] Red area[] Open Area  []Scar Tissue []At risk from prolonged sitting Where  ?????  History of Skin Issues  []Yes [x]No Where  ????? When  ?????  Hx of skin flap surgeries  []Yes [x]No Where  ????? When  ?????  Limited sitting tolerance []Yes [x]No Hours spent sitting in wheelchair daily: Pt sits in recliner at home  Complaint of Pain:  Please describe: Pt reports pain in bil. shoulders, neck and back pain- rates 8/10 intensity   Swelling/Edema: None   ADL STATUS (in reference to wheelchair use):  Indep Assist Unable Indep with Equip Not assessed Comments  Dressing ????? X ????? ????? ????? husband assists with dressing  Eating X ????? ????? ????? ????? ?????  Toileting ????? X ????? ????? ????? needs assistance with transfers  Bathing ????? X ????? ????? ????? uses shower chair in shower with a belt for safety  Grooming/Hygiene ????? X ????? ????? ????? needs assistance in standing for safety  Meal Prep ????? ????? X ????? ????? ?????  IADLS ????? ????? X ????? ????? ?????  Bowel Management: []Continent  []Incontinent  [x]Accidents Comments:  ?????  Bladder Management: []Continent  []Incontinent  [x]Accidents Comments:  ?????      WHEELCHAIR SKILLS: Manual w/c Propulsion: []UE or LE strength and endurance sufficient to participate in ADLs using manual wheelchair Arm : []left []right   []Both      Distance: ????? Foot:  []left []right   []Both  Operate Scooter: [] Strength, hand grip, balance and transfer appropriate for use []Living environment is accessible for use of scooter  Operate Power w/c:  [x] Std. Joystick   [] Alternative Controls Indep [x] Assist [x] Dependent/unable [] N/A []  [x]Safe          [x] Functional      Distance: ?????  Bed confined without wheelchair [] Yes [x] No   STRENGTH/RANGE OF MOTION:  ????? Range of Motion Strength  Shoulder ????? ?????  Elbow ????? ?????  Wrist/Hand ????? ?????  Hip ????? ?????  Knee ????? ?????  Ankle ????? ?????     MOBILITY/BALANCE:  [] Patient is totally dependent for mobility  ?????    Balance Transfers Ambulation  Sitting Balance: Standing Balance: [] Independent [] Independent/Modified Independent  [x] [facility]     [] [facility] [] Supervision [] Supervision  [] Uses UE for balance  [] Supervision [x] Min Assist [x] Ambulates with Assist  25'    [] Min Assist [x] Min assist [] Mod Assist [x] Ambulates with Device:      [x] RW  [] StW  [] Cane  [] ?????  [] Mod Assist [] Mod assist [] Max assist   [] Max Assist [] Max assist [] Dependent []  Indep. Short Distance Only  [] Unable [] Unable [] Lift / Sling Required Distance (in feet)  ?????   [] Sliding board [] Unable to Ambulate (see explanation below)  Cardio Status:  [x]Intact  [] Impaired   [] NA     ?????  Respiratory Status:  [x]Intact   []Impaired   []NA     ?????  Orthotics/Prosthetics: None  Comments (Address manual vs power w/c vs scooter): ?????         Anterior / Posterior Obliquity Rotation-Pelvis ?????  PELVIS    [x] [] []  Neutral Posterior Anterior  [x] [] []  WFL Rt elev Lt elev  [x] [] []  WFL Right Left                      Anterior    Anterior     [] Fixed [] Other [] Partly  Flexible [x] Flexible   [] Fixed [] Other [] Partly Flexible  [] Flexible  [] Fixed [] Other [] Partly Flexible  [x] Flexible   TRUNK  [] [] [x]  WFL ? Thoracic ? Lumbar  Kyphosis Lordosis  [x] [] []  WFL Convex Convex  Right Left []c-curve []s-curve []multiple  [x] Neutral [] Left-anterior [] Right-anterior     [] Fixed [x] Flexible [x] Partly Flexible [] Other  [] Fixed [x] Flexible [] Partly Flexible [] Other  [] Fixed             [x] Flexible [] Partly Flexible [] Other    Position Windswept  ?????  HIPS          [x]           []              []   Neutral       Abduct        ADduct         [x]          []           []  Neutral Right           Left      [] Fixed [] Subluxed [] Partly Flexible [] Dislocated [x] Flexible  [] Fixed [] Other [] Partly Flexible  [x] Flexible                 Foot Positioning Knee Positioning  ?????    [x] WFL  [x]Lt [x]Rt [x] [facility]  [x]Lt [x]Rt    KNEES ROM concerns: ROM concerns:    & Dorsi-Flexed []Lt []Rt ?????    FEET Plantar Flexed []Lt []Rt      Inversion                 []Lt []Rt      Eversion                 []Lt []Rt     HEAD [x] Functional [] Good Head Control  ?????  & [] Flexed         [] Extended [x] Adequate Head Control    NECK [] Rotated  Lt  [] Lat Flexed Lt [] Rotated  Rt [] Lat Flexed Rt [] Limited Head Control     [] Cervical Hyperextension [] Absent  Head Control     SHOULDERS ELBOWS WRIST& HAND no strength in bil. shoulder musc.       Left     Right  Left     Right    Left     Right   U/E []Functional           [x]Functional WNL's WNL's []Fisting             []Fisting      []elev   []dep      []elev   []dep       []pro -[]retract     []pro  []retract [x]subluxed             []subluxed           Goals for Wheelchair Mobility  [x] Independence with mobility in the home with motor related ADLs (MRADLs)  [] Independence with MRADLs in the community [] Provide dependent mobility  [] Provide recline      [x]Provide tilt   Goals for Seating system [x] Optimize pressure distribution [x] Provide support needed to facilitate function or safety [] Provide corrective forces to assist with maintaining or improving posture [] Accommodate client's posture:   current seated postures and positions are not flexible or will not tolerate corrective forces [x] Client to be independent with relieving pressure in the wheelchair []Enhance physiological function such as breathing, swallowing, digestion  Simulation ideas/Equipment trials:????? State why other equipment was unsuccessful:?????   MOBILITY BASE RECOMMENDATIONS and JUSTIFICATION: MOBILITY COMPONENT JUSTIFICATION  Manufacturer: [employer] Model: Q6 Edge 3.0   Size: Width 16Seat Depth 16 [x]provide transport from point A to B      [x]promote Indep mobility  [x]is not a safe, functional ambulator [x]walker or cane inadequate []non-standard width/depth necessary to accommodate anatomical measurement [] ?????  []Manual Mobility Base []non-functional ambulator    []Scooter/POV  []can safely operate  []can safely transfer   []has adequate trunk stability  []cannot functionally propel manual w/c  [x]Power Mobility Base  []non-ambulatory  [x]cannot functionally propel manual wheelchair  [x] cannot functionally and safely operate scooter/POV [x]can safely operate and willing to  []Stroller Base []infant/child  []unable to propel manual wheelchair []allows for growth []non-functional ambulator []non-functional UE []Indep mobility is not a goal at this time  [x]Tilt  []Forward [x]Backward [x]Powered tilt  []Manual tilt  [x]change position against gravitational force on head and shoulders  [x]change position for pressure relief/cannot weight shift []transfers  []management of tone [x]rest periods []control edema [x]facilitate postural control  [] ?????  []Recline  []Power recline on power base []Manual recline on manual base  []accommodate femur  to back angle  []bring to full recline for ADL care  []change position for pressure relief/cannot weight shift []rest periods []repositioning for transfers or clothing/diaper /catheter changes []head positioning  []Lighter weight required []self- propulsion  []lifting [] ?????  []Heavy Duty required []user weight greater than 250# []extreme tone/ over active movement []broken frame on previous chair [] ?????  [x] Back  [] Angle Adjustable [] Custom molded Sport Back with lumbar insert [x]postural control []control of tone/spasticity []accommodation of range of motion []UE functional control []accommodation for seating system [] ????? []provide lateral trunk support [x]accommodate deformity [x]provide posterior trunk support [x]provide lumbar/sacral support [x]support trunk in midline [x]Pressure relief over spinal processes  [x] [place] with incontinence liner []impaired sensation  []decubitus ulcers present []history of pressure ulceration []prevent pelvic extension [x]low maintenance  [x]stabilize pelvis  []accommodate obliquity []accommodate multiple deformity [x]neutralize lower extremity position [x]increase pressure distribution [x] skin protection; also pt is at risk with prolonged sitting   [] Pelvic/thigh support  [] Lateral  thigh guide [] Distal medial pad  [] Distal lateral pad [] pelvis in neutral []accommodate pelvis [] position upper legs [] alignment [] accommodate ROM [] decr adduction []accommodate tone []removable for transfers []decr abduction  [] Lateral trunk Supports [] Lt     [] Rt []decrease lateral trunk leaning []control tone []contour for increased contact []safety  []accommodate asymmetry [] ?????  [x] Mounting hardware  []lateral trunk supports  []back   []seat [x]headrest      [] thigh support []fixed   []swing away []attach seat platform/cushion to w/c frame []attach back cushion to w/c frame []mount  postural supports [x]mount headrest  []swing medial thigh support away []swing lateral supports away for transfers  [] ?????    Armrests  []fixed [x]adjustable height []removable   []swing away  [x]flip back   []reclining [x]full length pads []desk    []pads tubular  [x]provide support with elbow at 90   []provide support for w/c tray [x]change of height/angles for variable activities []remove for transfers []allow to come closer to table top [x]remove for access to tables [] ?????  Hangers/ Leg rests  []60 []70 []90 []elevating []heavy duty  []articulating []fixed []lift off []swing away     []power []provide LE support  []accommodate to hamstring tightness []elevate legs during recline   []provide change in position for Legs []Maintain placement of feet on footplate []durability []enable transfers []decrease edema []Accommodate lower leg length [] ?????  Foot support Footplate    []Lt  [] Rt  [x] Center mount [x]flip up     [x]depth/angle adjustable []Amputee adapter    [] Lt     [] Rt [x]provide foot support [x]accommodate to ankle ROM [x]transfers []Provide support for residual extremity [] allow foot to go under wheelchair base [] decrease tone  [] ?????  [] Ankle strap/heel loops []support foot on foot support []decrease extraneous movement []provide input to heel  []protect foot  Tires: []pneumatic  [x]flat free inserts  []solid  [x]decrease maintenance  [x]prevent frequent flats []increase shock absorbency []decrease pain from road shock []decrease spasms from road shock [] ?????  [x] Headrest  [x]provide posterior head support [x]provide posterior neck support []provide lateral head support []provide anterior head support [x]support during tilt and recline []improve feeding   []improve respiration []placement of switches [x]safety  []accommodate ROM  []accommodate tone []improve visual orientation  [] Anterior chest strap [] Vest [] Shoulder  retractors  []decrease forward movement of shoulder []accommodation of TLSO []decrease forward movement of trunk []decrease shoulder elevation []added abdominal support []alignment []assistance with shoulder control  [] ?????  Pelvic Positioner [x]Belt []SubASIS bar []Dual Pull []stabilize tone [x]decrease falling out of chair/ **will not Decr potential for sliding due to pelvic tilting []prevent excessive rotation []pad for protection over boney prominence []prominence comfort []special pull angle to control rotation [] ?????  Upper Extremity Support []L   [] R []Arm trough    []hand support [] tray       []full tray []swivel mount []decrease edema      []decrease subluxation   []control tone   []placement for AAC/Computer/EADL []decrease gravitational pull on shoulders []provide midline positioning []provide support to increase UE function []provide hand support in natural position []provide work surface   POWER WHEELCHAIR CONTROLS  [x]Proportional  []Non-Proportional Type joystick []Left  [x]Right [x]provides access for controlling wheelchair   []lacks motor control to operate proportional drive control []unable to understand proportional controls  Actuator Control Module  [x]Single  []  Multiple   [x]Allow the client to operate the power seat function(s) through the joystick control   []Safety Reset Switches []Used to change modes and stop the wheelchair when driving in latch mode    [x]Upgraded Electronics   [x]programming for accurate control []progressive Disease/changing condition []non-proportional drive control needed [x]Needed in order to operate power seat functions through joystick control   []Display box []Allows user to see in which mode and drive the wheelchair is set  []necessary for alternate controls    []Digital interface electronics []Allows w/c to operate when using alternative drive controls  []ASL Head Array []Allows client to operate wheelchair   through switches placed in tri-panel headrest  []Sip and puff with tubing kit []needed to operate sip and puff drive controls  []Upgraded tracking electronics []increase safety when driving []correct tracking when on uneven surfaces  [x]Mount for switches or joystick []Attaches switches to w/c  [x]Swing away for access or transfers []midline for optimal placement []provides for consistent access  []Attendant controlled joystick plus mount []safety []long distance driving []operation of seat functions []compliance with transportation regulations [] ?????    Rear wheel placement/Axle adjustability []None []semi adjustable []fully adjustable  []improved UE access to wheels []improved stability []changing angle in space for improvement of postural stability []1-arm drive access []amputee pad placement [] ?????  Wheel rims/ hand rims  []metal  []plastic coated []oblique projections []vertical projections []Provide ability to propel manual wheelchair  [] Increase self-propulsion with hand weakness/decreased grasp  Push handles []extended  []angle adjustable  []standard []caregiver access []caregiver assist []allows "hooking" to enable increased ability to perform ADLs or maintain balance  One armed device  []Lt   []Rt []enable propulsion of manual wheelchair with one arm   [] ?????   Brake/wheel lock extension [] Lt   [] Rt []increase indep in applying wheel locks   []Side guards []prevent clothing getting caught in wheel or becoming soiled [] prevent skin tears/abrasions  Battery: NF 22 x 2 [x]to power wheelchair ?????  Other: Ball style joystick knob To drive wheelchair safely due to decreased dexterity/coordination in hands ?????  The above equipment has a life- long use expectancy. Growth and changes in medical and/or functional conditions would be the exceptions. This is to certify that the therapist has no financial relationship with durable medical provider or manufacturer. The  therapist will not receive remuneration of any kind for the equipment recommended in this evaluation.   Patient has mobility limitation that significantly impairs safe, timely participation in one or more mobility related ADL's.  (bathing, toileting, feeding, dressing, grooming, moving from room to room)                                                             [x] Yes [] No Will mobility device sufficiently improve ability to participate and/or be aided in participation of MRADL's?         [x] Yes [] No Can limitation be compensated for with use of a cane or walker?                                                                                []  Yes [x] No Does patient or caregiver demonstrate ability/potential ability & willingness to safely use the mobility device?   [x] Yes [] No Does patient's home environment support use of recommended mobility device?                                                    [x] Yes [] No Does patient have sufficient upper extremity function necessary to functionally propel a manual wheelchair?    [] Yes [x] No Does patient have sufficient strength and trunk stability to safely operate a POV (scooter)?                                  [] Yes [x] No Does patient need additional features/benefits provided by a power wheelchair for MRADL's in the home?       [x] Yes [] No Does the patient demonstrate the ability to safely use a power wheelchair?                                                              [x] Yes [] No  Therapist Name Printed: Guido Sander, PT Date: 02-02-18  Therapist's Signature:   Date:   Supplier's Name Printed: [employer], [employer] Date: 02-02-18  Supplier's Signature:   Date:  Patient/Caregiver Signature:   Date:     This is to certify that I have read this evaluation and do agree with the content within:      Physician's Name Printed: Leighton Ruff, MD  21 Signature:  Date:     This is to certify that I, the above signed  therapist have the following affiliations: [] This DME provider [] Manufacturer of recommended equipment [] Patient's long term care facility [x] None of the above                      Plan - 02/03/18 1510    Clinical Impression Statement  Pt is a 67 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  Pt is currently using a RW for assistance with ambulation but is very unsteady and is at risk for falls, as she has fallen approx. 4-6 times in past 6 months.  Pt evaluated for power wheelchair - Josh Cadle, [employer] with [facility] present for eval.      Clinical Impairments Affecting Rehab Potential  prior gait disorder due to TBI and infarcts    PT Frequency  One time visit    PT Treatment/Interventions  -- wheelchair management    Consulted and Agree with Plan of Care  Patient       Patient will benefit from skilled therapeutic intervention in order to improve the following deficits and impairments:  Abnormal gait, Decreased activity tolerance, Decreased balance, Decreased coordination, Decreased endurance, Decreased knowledge of use of DME, Decreased mobility, Decreased strength, Dizziness, Impaired UE functional use  Visit Diagnosis: Other abnormalities of gait and mobility - Plan: PT plan of  care cert/re-cert  Muscle weakness (generalized) - Plan: PT plan of care cert/re-cert  Other symptoms and signs involving the nervous system - Plan: PT plan of care cert/re-cert  Other lack of coordination - Plan: PT plan of care cert/re-cert     Problem List Patient Active Problem List   Diagnosis Date Noted  . Overactive bladder 08/18/2017  . Faintness   . Left rotator cuff tear 02/01/2015  . Closed left humeral fracture 12/07/2014  . Fatigue 12/07/2014  . 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 Dystonia 08/10/2014  . Syncope and collapse 06/08/2014  . Orthostatic hypotension 06/08/2014  . 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 Fall down stairs 12/11/2013  . Basilar skull fracture ([place]) 12/11/2013  . Traumatic intracranial hemorrhage ([place]) 12/11/2013  . Back pain 12/11/2013  . Subarachnoid hemorrhage following injury ([place]) 12/09/2013  . Syncope 03/27/2013  . Clavicular fracture 03/27/2013  . COUGH 12/28/2007    DildayJenness Corner, PT 02/03/2018, 3:20 PM  [facility] [address] [place], [place], [address] Phone: [phone]   Fax:  [phone]  Name: Margarete Horace MRN: [id number] Date of Birth: [birth year]

## 2018-02-21 ENCOUNTER — Encounter: Payer: Self-pay | Admitting: Neurology

## 2018-02-21 ENCOUNTER — Ambulatory Visit (INDEPENDENT_AMBULATORY_CARE_PROVIDER_SITE_OTHER): Payer: Medicare Other | Admitting: Neurology

## 2018-02-21 VITALS — BP 141/70 | HR 86 | Ht 63.0 in | Wt 105.0 lb

## 2018-02-21 DIAGNOSIS — R296 Repeated falls: Secondary | ICD-10-CM

## 2018-02-21 DIAGNOSIS — R269 Unspecified abnormalities of gait and mobility: Secondary | ICD-10-CM

## 2018-02-21 DIAGNOSIS — Z8679 Personal history of other diseases of the circulatory system: Secondary | ICD-10-CM | POA: Diagnosis not present

## 2018-02-21 DIAGNOSIS — R259 Unspecified abnormal involuntary movements: Secondary | ICD-10-CM | POA: Diagnosis not present

## 2018-02-21 DIAGNOSIS — G3281 Cerebellar ataxia in diseases classified elsewhere: Secondary | ICD-10-CM

## 2018-02-21 NOTE — Patient Instructions (Signed)
I would recommend that you go back to [facility] or discuss with Dr. Drema Dallas the possibility of seeing another specialist at another academic [facility] or even [facility]. I am afraid I don't know what else to offer you. As discussed, we can certainly request another brain MRI for comparison. We will call you with the results. Please follow-up with Dr. Drema Dallas.

## 2018-02-21 NOTE — Progress Notes (Signed)
Subjective:    Patient ID: Claudia Perez is a 67 y.o. female.  HPI     Claudia Age, MD, PhD [facility] [address]O. [place], [place] [address]  Dear Dr. Drema Dallas,   I saw your patient, Claudia Perez, upon your kind request, in my neurologic clinic today for re-consultation of her gait disorder with recurrent falls and abnormal involuntary movements.as you know, she is a 67 year old woman with an underlying medical history of progressive gait disorder, abnormal involuntary movements, history of left sided subdural hematoma with status post evacuation, who presents for reevaluation. The patient is unaccompanied today. Her husband was in the waiting room and she preferred to stay in the room by herself. She feels that she is getting worse. She hurt her shoulder recently after a fall. I had referred her to [facility] and she had gone in 2015, did not make her last follow-up from what I can see in the records. She uses a walker. She has some muscle cramping at times. I have previously explained to her that my role would be very limited in that I had nothing else to offer to her, she has had prior workup for possible underlying cornea, Wilson's disease, muscular dystrophy etc.   Previously:   03/01/14: Claudia Perez is a 67 year old right-handed woman with an underlying medical history of gait disorder, proximal weakness and abnormal involuntary movements , history of left subdural hematoma, and right temporal contusion in December 2004, status post left parietal craniotomy when residing in [place], as well as a diagnosis of dystonia muscular deformans in [place]. I referred her to [facility], Dr. Vallarie Mare. She missed an appointment at [facility] recently, but as I understand, she has an appointment with them in 5/15. She was admitted to [facility] on 12/09/2013 and discharged to inpatient rehabilitation on 12/14/2013. She presented with a fall with  injury of the back of her head. She denied loss of consciousness or syncopal events. She fell backwards from stairs. Her initial head CT from 12/09/2013 showed: Nondisplaced left parietal skull fracture is noted, with large left parietal and occipital scalp hematoma. Small subdural hemorrhage is seen in the anterior interhemispheric fissure as well as the vertex of the falx as well as left tentorial region. Right frontal intraparenchymal hemorrhage is noted. Subarachnoid hemorrhage is noted in bilateral frontal and right temporal regions. In addition I reviewed the images through the PACS system. She had a followup head CT on 12/11/2013: 1. Bifrontal and right temporal hemorrhagic contusions show modest increase from 12/09/2013. 2. Small volume, multi focal subarachnoid hemorrhage shows mild increase. No hydrocephalus. 3. Thin, multi focal subdural hematoma is without significant increase. 4. Nondepressed left calvarial fracture, unchanged. In addition, I reviewed the images through the PACS system.  Cervical spine CT from 12/09/2013 showed no cervical spine fracture. Thoracic spine CT from 12/11/2013 showed normal findings. During her hospital stay neurosurgery was consulted (Dr. Luiz Ochoa) and recommended nonoperative treatment. The traumatic brain injury team worked with her and recommended inpatient rehabilitation and Dr. Naaman Plummer from [facility] was consulted. Upon discharge, outpatient followup with Drs. Naaman Plummer and Luiz Ochoa was recommended. She saw Dr. 9, RR on 01/31/2014 and followup in neurosurgery, and she was referred to Dr. Carles Collet in neurology.  She reports, that she was started on Topamax during the hospital stay. She has not been told, that there is an appointment made for her with Dr. Carles Collet yet. She has been using her 2 wheeled walker,  she is in PT outpatient. She fell last week, while using her walker and got scraped on her the lower jaw, no head injury, no LOC and she landed on her buttocks.   02/01/2013 and  she previously followed with Dr. Erling Cruz. She previously had normal ceruloplasmin levels in November 2010 as well as negative workup for Huntington's disease. Head CT in April 2012 showed generalized atrophy and CT of her C-spine showed spondylosis. She has had some memory loss and depression but no bowel or bladder incontinence or hallucinations. EMG and nerve conduction studies as well as ESR have been normal. She was last seen by Dr. Erling Cruz in February 2014 at which time he suggested a referral to a tertiary care center which I initiated in March 2014. In March she had repeat MRI of the C-spine which showed disc bulging with mild to moderate spinal stenosis and neuroforaminal stenosis at C5-6 and C6-7, no cord signal abnormalities noted. Swallow study in February 2014 showed mild penetration but no aspiration. EMG nerve conductions test in February 2014 showed no evidence of a peripheral neuropathy. EMG in the right lower extremity showed mild stable chronic L5 radiculopathy. Right upper extremity EMG showed multilevel cervical radiculopathy-type findings.  Her Past Medical History Is Significant For: Past Medical History:  Diagnosis Date  . Abnormality of gait   . Fall at home 03/27/2013   "lost consciousness; fractured right clavicle" (03/27/2013)  . GERD (gastroesophageal reflux disease)   . Hypercholesteremia   . Hyperthyroidism    "a little bit; don't take RX for it" (03/27/2013)  . Mobility impaired   . Other closed skull fracture without mention of intracranial injury, unspecified state of consciousness   . Seizures ([place])    "think I might have; been blacking out several times in the past" (03/27/2013)  . Shortness of breath    "even now, just talking" (03/27/2013)  . Subdural hemorrhage ([place])   . Vitamin D deficiency     Her Past Surgical History Is Significant For: Past Surgical History:  Procedure Laterality Date  . APPENDECTOMY  1960's  . BRAIN SURGERY  10/2003   SDH evac  . EYE  SURGERY  02/2013   cataract extraction w/ intraocular lens implant    Her Family History Is Significant For: Family History  Problem Relation Perez of Onset  . Hypertension Mother   . Cirrhosis Brother     Her Social History Is Significant For: Social History   Socioeconomic History  . Marital status: Married    Spouse name: Not on file  . Number of children: Not on file  . Years of education: Not on file  . Highest education level: Not on file  Occupational History  . Not on file  Social Needs  . Financial resource strain: Not on file  . Food insecurity:    Worry: Not on file    Inability: Not on file  . Transportation needs:    Medical: Not on file    Non-medical: Not on file  Tobacco Use  . Smoking status: Never Smoker  . Smokeless tobacco: Never Used  Substance and Sexual Activity  . Alcohol use: Yes    Comment: 03/27/2013 glass of wine "couple times/yr"  . Drug use: No  . Sexual activity: Not Currently  Lifestyle  . Physical activity:    Days per week: Not on file    Minutes per session: Not on file  . Stress: Not on file  Relationships  . Social connections:  Talks on phone: Not on file    Gets together: Not on file    Attends religious service: Not on file    Active member of club or organization: Not on file    Attends meetings of clubs or organizations: Not on file    Relationship status: Not on file  Other Topics Concern  . Not on file  Social History Narrative  . Not on file    Her Allergies Are:  Allergies  Allergen Reactions  . Nitrofurantoin Itching  . Phenazopyridine Other (See Comments)  . Sulfamethoxazole-Trimethoprim Itching    Bactrim  . Sulfamethoxazole Rash  :   Her Current Medications Are:  Outpatient Encounter Medications as of 02/21/2018  Medication Sig  . acetaminophen (TYLENOL) 500 MG tablet Take 1,000 mg by mouth every 6 (six) hours as needed for moderate pain.  . citalopram (CELEXA) 10 MG tablet Take 10 mg by mouth every  other day.   . diclofenac sodium (VOLTAREN) 1 % GEL Apply 1 application topically 3 (three) times daily. Shoulders and knees.  Marland Kitchen MYRBETRIQ 25 MG TB24 tablet TAKE 1 TABLET BY MOUTH EVERY DAY   No facility-administered encounter medications on file as of 02/21/2018.   : Review of Systems:  Out of a complete 14 point review of systems, all are reviewed and negative with the exception of these symptoms as listed below:  Review of Systems  Neurological:       Pt presents today to discuss her gait. Pt has fallen several times and hurt her shoulder. Pt has questions about taking celexa. Pt has not seen [facility] in several years, per her report.    Objective:  Neurological Exam  Physical Exam Physical Examination:   Vitals:   02/21/18 0927  BP: (!) 141/70  Pulse: 86   General Examination: The patient is a very pleasant 67 y.o. female in no acute distress. She appears frail. Well groomed.   HEENT: Normocephalic, moderate dysarthria. Mild involuntary movements. Extraocular tracking is mildly impaired. She is status post cataract repairs.  Chest is clear to auscultation, heart sounds are normal without murmurs, rubs or gallops, abdomen is soft, nontender with normal bowel sounds.   She has no pitting edema in the distal lower extremities.   Neurologically: Mental status: The patient is awake, alert and oriented in all 4 spheres. She has no evidence of aphasia, agnosia or apraxia or anomia.  Cranial nerves are as described under HEENT exam.  Motor exam: She has a fairly normal tone throughout. She has no evidence of resting tremor. She does display involuntary dyskinetic truncal movements and in her extremities. She has no postural or action tremor. She has no myoclonus and no clear athetosis. Strength is about 4/5. Reflexes are 2+. Sensory exam is intact to light touchrugae stands wide-based, she walks insecurely, she has a 2 wheeled walker.  Assessment and Plan:   In summary, Minahil Quinlivan  is a very pleasant 67 year old right-handed woman with an underlying medical history of gait disorder, proximal weakness and abnormal involuntary movements, history of left subdural hematoma, and right temporal contusion in December 2004, status post left parietal craniotomy when residing in [place], as well as a diagnosis of dystonia muscular deformans years ago, who presents for reevaluation of her recurrent falls. She had been followed at [facility], I had made a referral in 2015 for this. She stopped going. She is advised that I really don't have anything else to offer her. At this juncture, she is advised  to follow-up at [facility] or another academic neurologic center, even [facility]. She is encouraged to discuss this with you. I did offer her an updated brain MRI which she was agreeable to. I will order a brain MRI with and without contrast, last BUN and creatinine level in January 2019 were fine. We will call her with her test results. I have requested that she follow-up with you to discuss further referral. We did talk about her gait disorder and her fall risk. I encouraged the patient to eat healthy and drink more water. I answered all her questions today and she was in agreement. Thank you very much for allowing me to participate in the care of this nice patient. If I can be of any further assistance to you please do not hesitate to call me at [phone].  Sincerely,  Claudia Age, MD, PhD

## 2018-02-23 ENCOUNTER — Telehealth: Payer: Self-pay | Admitting: Neurology

## 2018-02-23 NOTE — Telephone Encounter (Signed)
Medicare/bcbs Auth: NPR Ref # [id number] order sent to GI they will contact the pt to schedule.

## 2018-03-03 DIAGNOSIS — R1310 Dysphagia, unspecified: Secondary | ICD-10-CM | POA: Diagnosis not present

## 2018-03-08 ENCOUNTER — Ambulatory Visit
Admission: RE | Admit: 2018-03-08 | Discharge: 2018-03-08 | Disposition: A | Discharge status: Home / Self Care | Payer: Medicare Other | Source: Ambulatory Visit | Attending: Neurology | Admitting: Neurology

## 2018-03-08 DIAGNOSIS — Z8679 Personal history of other diseases of the circulatory system: Secondary | ICD-10-CM

## 2018-03-08 DIAGNOSIS — R259 Unspecified abnormal involuntary movements: Secondary | ICD-10-CM

## 2018-03-08 DIAGNOSIS — R269 Unspecified abnormalities of gait and mobility: Secondary | ICD-10-CM

## 2018-03-08 DIAGNOSIS — R270 Ataxia, unspecified: Secondary | ICD-10-CM | POA: Diagnosis not present

## 2018-03-08 DIAGNOSIS — R296 Repeated falls: Secondary | ICD-10-CM

## 2018-03-08 DIAGNOSIS — G3281 Cerebellar ataxia in diseases classified elsewhere: Secondary | ICD-10-CM

## 2018-03-08 MED ORDER — GADOBENATE DIMEGLUMINE 529 MG/ML IV SOLN
9.0000 mL | Freq: Once | INTRAVENOUS | Status: AC | PRN
  Administered 2018-03-08: 9 mL via INTRAVENOUS

## 2018-03-14 ENCOUNTER — Telehealth: Payer: Self-pay | Admitting: Neurology

## 2018-03-14 DIAGNOSIS — R269 Unspecified abnormalities of gait and mobility: Secondary | ICD-10-CM

## 2018-03-14 DIAGNOSIS — R259 Unspecified abnormal involuntary movements: Secondary | ICD-10-CM

## 2018-03-14 NOTE — Telephone Encounter (Signed)
I called pt again but her mobile number is busy, unable to leave a message.

## 2018-03-14 NOTE — Telephone Encounter (Signed)
Please call patient, MRI of brain showed evidence of scar at bilateral frontal, right anterior temporal, and the left parietal region, no major change from previous scan in June 2016.    Abnormal MRI brain (with and without) demonstrating: 1. 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  Left parietal craniotomy changes. 2. No acute findings. No major change from CT on 05/24/15.

## 2018-03-14 NOTE — Telephone Encounter (Signed)
I called pt to discuss her MRI brain results. No answer at home number, left a message asking her to call me back. Pt's mobile number was busy.

## 2018-03-15 DIAGNOSIS — E7800 Pure hypercholesterolemia, unspecified: Secondary | ICD-10-CM | POA: Diagnosis not present

## 2018-03-15 DIAGNOSIS — G259 Extrapyramidal and movement disorder, unspecified: Secondary | ICD-10-CM | POA: Diagnosis not present

## 2018-03-15 DIAGNOSIS — Z8679 Personal history of other diseases of the circulatory system: Secondary | ICD-10-CM | POA: Diagnosis not present

## 2018-03-15 DIAGNOSIS — R269 Unspecified abnormalities of gait and mobility: Secondary | ICD-10-CM | POA: Diagnosis not present

## 2018-03-15 DIAGNOSIS — G479 Sleep disorder, unspecified: Secondary | ICD-10-CM | POA: Diagnosis not present

## 2018-03-15 NOTE — Telephone Encounter (Signed)
I called pt again to discuss her MRI results. No answer at home phone, left a message asking her to call me back. Called mobile number, but it is busy, unable to leave a message.

## 2018-03-16 ENCOUNTER — Encounter: Payer: Medicare Other | Admitting: Physical Medicine & Rehabilitation

## 2018-03-16 NOTE — Telephone Encounter (Signed)
Patient's husband called stating patient needs a referral in order to be seen at [facility].

## 2018-03-16 NOTE — Telephone Encounter (Signed)
I called pt, explained her MRI results per Dr. Krista Blue. Pt reports having a craniotomy in the past. I encouraged pt to follow up with [facility] or another academic neurologic center, even [facility], as discussed with Dr. Rexene Alberts in the office visit. Pt is agreeable to this and I gave her [facility] neurology's phone number. Pt verbalized understanding of the results and recommendations. Pt had no questions at this time but was encouraged to call back if questions arise.

## 2018-03-16 NOTE — Telephone Encounter (Signed)
I called pt to discuss, no answer at home number, asked pt to call me back.  I also attempted to call pt's mobile number, but it continues to ring busy.  This is my third attempt at reaching pt by phone. Will send pt a letter asking her to call me back.

## 2018-03-16 NOTE — Telephone Encounter (Signed)
I spoke with Dr. Krista Blue, [facility], on behalf of Dr. Rexene Alberts. She gave VO for referral to [facility] for evaluation of pt's movement disorder.  Hinton Dyer, please call the pt to discuss.

## 2018-03-16 NOTE — Addendum Note (Signed)
Addended by: Lester [place] A on: 03/16/2018 02:03 PM   Modules accepted: Orders

## 2018-03-17 NOTE — Telephone Encounter (Signed)
Called and left patient a message relaying that I  Have sent her referral to [facility] telephone [phone] - fax [phone] . Adonis Brook will call patient with an apt.

## 2018-03-28 DIAGNOSIS — Z9889 Other specified postprocedural states: Secondary | ICD-10-CM | POA: Diagnosis not present

## 2018-03-28 DIAGNOSIS — Z8679 Personal history of other diseases of the circulatory system: Secondary | ICD-10-CM | POA: Diagnosis not present

## 2018-03-28 DIAGNOSIS — R2681 Unsteadiness on feet: Secondary | ICD-10-CM | POA: Diagnosis not present

## 2018-03-28 DIAGNOSIS — G259 Extrapyramidal and movement disorder, unspecified: Secondary | ICD-10-CM | POA: Diagnosis not present

## 2018-03-31 DIAGNOSIS — R1312 Dysphagia, oropharyngeal phase: Secondary | ICD-10-CM | POA: Diagnosis not present

## 2018-03-31 DIAGNOSIS — M8580 Other specified disorders of bone density and structure, unspecified site: Secondary | ICD-10-CM | POA: Diagnosis not present

## 2018-03-31 DIAGNOSIS — F329 Major depressive disorder, single episode, unspecified: Secondary | ICD-10-CM | POA: Diagnosis not present

## 2018-03-31 DIAGNOSIS — E7800 Pure hypercholesterolemia, unspecified: Secondary | ICD-10-CM | POA: Diagnosis not present

## 2018-03-31 DIAGNOSIS — K219 Gastro-esophageal reflux disease without esophagitis: Secondary | ICD-10-CM | POA: Diagnosis not present

## 2018-03-31 DIAGNOSIS — Z9181 History of falling: Secondary | ICD-10-CM | POA: Diagnosis not present

## 2018-03-31 DIAGNOSIS — Z8781 Personal history of (healed) traumatic fracture: Secondary | ICD-10-CM | POA: Diagnosis not present

## 2018-03-31 DIAGNOSIS — G241 Genetic torsion dystonia: Secondary | ICD-10-CM | POA: Diagnosis not present

## 2018-03-31 DIAGNOSIS — G479 Sleep disorder, unspecified: Secondary | ICD-10-CM | POA: Diagnosis not present

## 2018-03-31 DIAGNOSIS — E559 Vitamin D deficiency, unspecified: Secondary | ICD-10-CM | POA: Diagnosis not present

## 2018-04-06 DIAGNOSIS — E559 Vitamin D deficiency, unspecified: Secondary | ICD-10-CM | POA: Diagnosis not present

## 2018-04-06 DIAGNOSIS — R1312 Dysphagia, oropharyngeal phase: Secondary | ICD-10-CM | POA: Diagnosis not present

## 2018-04-06 DIAGNOSIS — G479 Sleep disorder, unspecified: Secondary | ICD-10-CM | POA: Diagnosis not present

## 2018-04-06 DIAGNOSIS — F329 Major depressive disorder, single episode, unspecified: Secondary | ICD-10-CM | POA: Diagnosis not present

## 2018-04-06 DIAGNOSIS — M8580 Other specified disorders of bone density and structure, unspecified site: Secondary | ICD-10-CM | POA: Diagnosis not present

## 2018-04-06 DIAGNOSIS — G241 Genetic torsion dystonia: Secondary | ICD-10-CM | POA: Diagnosis not present

## 2018-04-08 DIAGNOSIS — M8580 Other specified disorders of bone density and structure, unspecified site: Secondary | ICD-10-CM | POA: Diagnosis not present

## 2018-04-08 DIAGNOSIS — E559 Vitamin D deficiency, unspecified: Secondary | ICD-10-CM | POA: Diagnosis not present

## 2018-04-08 DIAGNOSIS — G479 Sleep disorder, unspecified: Secondary | ICD-10-CM | POA: Diagnosis not present

## 2018-04-08 DIAGNOSIS — R1312 Dysphagia, oropharyngeal phase: Secondary | ICD-10-CM | POA: Diagnosis not present

## 2018-04-08 DIAGNOSIS — G241 Genetic torsion dystonia: Secondary | ICD-10-CM | POA: Diagnosis not present

## 2018-04-08 DIAGNOSIS — F329 Major depressive disorder, single episode, unspecified: Secondary | ICD-10-CM | POA: Diagnosis not present

## 2018-04-12 DIAGNOSIS — E559 Vitamin D deficiency, unspecified: Secondary | ICD-10-CM | POA: Diagnosis not present

## 2018-04-12 DIAGNOSIS — R1312 Dysphagia, oropharyngeal phase: Secondary | ICD-10-CM | POA: Diagnosis not present

## 2018-04-12 DIAGNOSIS — G241 Genetic torsion dystonia: Secondary | ICD-10-CM | POA: Diagnosis not present

## 2018-04-12 DIAGNOSIS — M8580 Other specified disorders of bone density and structure, unspecified site: Secondary | ICD-10-CM | POA: Diagnosis not present

## 2018-04-12 DIAGNOSIS — F329 Major depressive disorder, single episode, unspecified: Secondary | ICD-10-CM | POA: Diagnosis not present

## 2018-04-12 DIAGNOSIS — G479 Sleep disorder, unspecified: Secondary | ICD-10-CM | POA: Diagnosis not present

## 2018-04-15 DIAGNOSIS — E559 Vitamin D deficiency, unspecified: Secondary | ICD-10-CM | POA: Diagnosis not present

## 2018-04-15 DIAGNOSIS — G479 Sleep disorder, unspecified: Secondary | ICD-10-CM | POA: Diagnosis not present

## 2018-04-15 DIAGNOSIS — R1312 Dysphagia, oropharyngeal phase: Secondary | ICD-10-CM | POA: Diagnosis not present

## 2018-04-15 DIAGNOSIS — F329 Major depressive disorder, single episode, unspecified: Secondary | ICD-10-CM | POA: Diagnosis not present

## 2018-04-15 DIAGNOSIS — G241 Genetic torsion dystonia: Secondary | ICD-10-CM | POA: Diagnosis not present

## 2018-04-15 DIAGNOSIS — M8580 Other specified disorders of bone density and structure, unspecified site: Secondary | ICD-10-CM | POA: Diagnosis not present

## 2018-04-20 DIAGNOSIS — R1312 Dysphagia, oropharyngeal phase: Secondary | ICD-10-CM | POA: Diagnosis not present

## 2018-04-20 DIAGNOSIS — G241 Genetic torsion dystonia: Secondary | ICD-10-CM | POA: Diagnosis not present

## 2018-04-20 DIAGNOSIS — M8580 Other specified disorders of bone density and structure, unspecified site: Secondary | ICD-10-CM | POA: Diagnosis not present

## 2018-04-20 DIAGNOSIS — E559 Vitamin D deficiency, unspecified: Secondary | ICD-10-CM | POA: Diagnosis not present

## 2018-04-20 DIAGNOSIS — G479 Sleep disorder, unspecified: Secondary | ICD-10-CM | POA: Diagnosis not present

## 2018-04-20 DIAGNOSIS — F329 Major depressive disorder, single episode, unspecified: Secondary | ICD-10-CM | POA: Diagnosis not present

## 2018-04-22 DIAGNOSIS — M8580 Other specified disorders of bone density and structure, unspecified site: Secondary | ICD-10-CM | POA: Diagnosis not present

## 2018-04-22 DIAGNOSIS — G241 Genetic torsion dystonia: Secondary | ICD-10-CM | POA: Diagnosis not present

## 2018-04-22 DIAGNOSIS — F329 Major depressive disorder, single episode, unspecified: Secondary | ICD-10-CM | POA: Diagnosis not present

## 2018-04-22 DIAGNOSIS — G479 Sleep disorder, unspecified: Secondary | ICD-10-CM | POA: Diagnosis not present

## 2018-04-22 DIAGNOSIS — E559 Vitamin D deficiency, unspecified: Secondary | ICD-10-CM | POA: Diagnosis not present

## 2018-04-22 DIAGNOSIS — R1312 Dysphagia, oropharyngeal phase: Secondary | ICD-10-CM | POA: Diagnosis not present

## 2018-04-25 DIAGNOSIS — R1312 Dysphagia, oropharyngeal phase: Secondary | ICD-10-CM | POA: Diagnosis not present

## 2018-04-25 DIAGNOSIS — G241 Genetic torsion dystonia: Secondary | ICD-10-CM | POA: Diagnosis not present

## 2018-04-25 DIAGNOSIS — M8580 Other specified disorders of bone density and structure, unspecified site: Secondary | ICD-10-CM | POA: Diagnosis not present

## 2018-04-25 DIAGNOSIS — E559 Vitamin D deficiency, unspecified: Secondary | ICD-10-CM | POA: Diagnosis not present

## 2018-04-25 DIAGNOSIS — G479 Sleep disorder, unspecified: Secondary | ICD-10-CM | POA: Diagnosis not present

## 2018-04-25 DIAGNOSIS — F329 Major depressive disorder, single episode, unspecified: Secondary | ICD-10-CM | POA: Diagnosis not present

## 2018-04-27 DIAGNOSIS — F329 Major depressive disorder, single episode, unspecified: Secondary | ICD-10-CM | POA: Diagnosis not present

## 2018-04-27 DIAGNOSIS — G241 Genetic torsion dystonia: Secondary | ICD-10-CM | POA: Diagnosis not present

## 2018-04-27 DIAGNOSIS — R1312 Dysphagia, oropharyngeal phase: Secondary | ICD-10-CM | POA: Diagnosis not present

## 2018-04-27 DIAGNOSIS — M8580 Other specified disorders of bone density and structure, unspecified site: Secondary | ICD-10-CM | POA: Diagnosis not present

## 2018-04-27 DIAGNOSIS — G479 Sleep disorder, unspecified: Secondary | ICD-10-CM | POA: Diagnosis not present

## 2018-04-27 DIAGNOSIS — E559 Vitamin D deficiency, unspecified: Secondary | ICD-10-CM | POA: Diagnosis not present

## 2018-05-02 DIAGNOSIS — F329 Major depressive disorder, single episode, unspecified: Secondary | ICD-10-CM | POA: Diagnosis not present

## 2018-05-02 DIAGNOSIS — R1312 Dysphagia, oropharyngeal phase: Secondary | ICD-10-CM | POA: Diagnosis not present

## 2018-05-02 DIAGNOSIS — G241 Genetic torsion dystonia: Secondary | ICD-10-CM | POA: Diagnosis not present

## 2018-05-02 DIAGNOSIS — E559 Vitamin D deficiency, unspecified: Secondary | ICD-10-CM | POA: Diagnosis not present

## 2018-05-02 DIAGNOSIS — G479 Sleep disorder, unspecified: Secondary | ICD-10-CM | POA: Diagnosis not present

## 2018-05-02 DIAGNOSIS — M8580 Other specified disorders of bone density and structure, unspecified site: Secondary | ICD-10-CM | POA: Diagnosis not present

## 2018-05-09 DIAGNOSIS — M8580 Other specified disorders of bone density and structure, unspecified site: Secondary | ICD-10-CM | POA: Diagnosis not present

## 2018-05-09 DIAGNOSIS — G479 Sleep disorder, unspecified: Secondary | ICD-10-CM | POA: Diagnosis not present

## 2018-05-09 DIAGNOSIS — F329 Major depressive disorder, single episode, unspecified: Secondary | ICD-10-CM | POA: Diagnosis not present

## 2018-05-09 DIAGNOSIS — R1312 Dysphagia, oropharyngeal phase: Secondary | ICD-10-CM | POA: Diagnosis not present

## 2018-05-09 DIAGNOSIS — E559 Vitamin D deficiency, unspecified: Secondary | ICD-10-CM | POA: Diagnosis not present

## 2018-05-09 DIAGNOSIS — G241 Genetic torsion dystonia: Secondary | ICD-10-CM | POA: Diagnosis not present

## 2018-05-12 DIAGNOSIS — M6281 Muscle weakness (generalized): Secondary | ICD-10-CM | POA: Diagnosis not present

## 2018-05-12 DIAGNOSIS — G249 Dystonia, unspecified: Secondary | ICD-10-CM | POA: Diagnosis not present

## 2018-05-12 DIAGNOSIS — R270 Ataxia, unspecified: Secondary | ICD-10-CM | POA: Diagnosis not present

## 2018-05-12 DIAGNOSIS — Z820 Family history of epilepsy and other diseases of the nervous system: Secondary | ICD-10-CM | POA: Diagnosis not present

## 2018-05-12 DIAGNOSIS — R278 Other lack of coordination: Secondary | ICD-10-CM | POA: Diagnosis not present

## 2018-05-12 DIAGNOSIS — G255 Other chorea: Secondary | ICD-10-CM | POA: Diagnosis not present

## 2018-05-12 DIAGNOSIS — R4189 Other symptoms and signs involving cognitive functions and awareness: Secondary | ICD-10-CM | POA: Diagnosis not present

## 2018-05-16 DIAGNOSIS — R0681 Apnea, not elsewhere classified: Secondary | ICD-10-CM | POA: Diagnosis not present

## 2018-05-26 ENCOUNTER — Other Ambulatory Visit (HOSPITAL_BASED_OUTPATIENT_CLINIC_OR_DEPARTMENT_OTHER): Payer: Self-pay

## 2018-05-26 DIAGNOSIS — G4710 Hypersomnia, unspecified: Secondary | ICD-10-CM

## 2018-05-26 DIAGNOSIS — R5383 Other fatigue: Secondary | ICD-10-CM

## 2018-05-26 DIAGNOSIS — R0683 Snoring: Secondary | ICD-10-CM

## 2018-05-26 DIAGNOSIS — G4730 Sleep apnea, unspecified: Secondary | ICD-10-CM

## 2018-06-15 ENCOUNTER — Encounter (HOSPITAL_BASED_OUTPATIENT_CLINIC_OR_DEPARTMENT_OTHER): Payer: Medicare Other

## 2018-06-29 ENCOUNTER — Ambulatory Visit (HOSPITAL_BASED_OUTPATIENT_CLINIC_OR_DEPARTMENT_OTHER): Payer: Medicare Other

## 2018-07-14 DIAGNOSIS — G255 Other chorea: Secondary | ICD-10-CM | POA: Diagnosis not present

## 2018-07-14 DIAGNOSIS — K117 Disturbances of salivary secretion: Secondary | ICD-10-CM | POA: Diagnosis not present

## 2018-07-14 DIAGNOSIS — R270 Ataxia, unspecified: Secondary | ICD-10-CM | POA: Diagnosis not present

## 2018-07-14 DIAGNOSIS — G249 Dystonia, unspecified: Secondary | ICD-10-CM | POA: Diagnosis not present

## 2018-07-14 DIAGNOSIS — M542 Cervicalgia: Secondary | ICD-10-CM | POA: Diagnosis not present

## 2018-07-14 DIAGNOSIS — R531 Weakness: Secondary | ICD-10-CM | POA: Diagnosis not present

## 2018-07-14 DIAGNOSIS — R4189 Other symptoms and signs involving cognitive functions and awareness: Secondary | ICD-10-CM | POA: Diagnosis not present

## 2018-07-14 DIAGNOSIS — R2689 Other abnormalities of gait and mobility: Secondary | ICD-10-CM | POA: Diagnosis not present

## 2018-07-14 DIAGNOSIS — Z8673 Personal history of transient ischemic attack (TIA), and cerebral infarction without residual deficits: Secondary | ICD-10-CM | POA: Diagnosis not present

## 2018-07-14 DIAGNOSIS — R2681 Unsteadiness on feet: Secondary | ICD-10-CM | POA: Diagnosis not present

## 2018-07-29 ENCOUNTER — Other Ambulatory Visit: Payer: Self-pay

## 2018-07-29 ENCOUNTER — Emergency Department (HOSPITAL_BASED_OUTPATIENT_CLINIC_OR_DEPARTMENT_OTHER): Payer: Medicare Other

## 2018-07-29 ENCOUNTER — Encounter (HOSPITAL_BASED_OUTPATIENT_CLINIC_OR_DEPARTMENT_OTHER): Payer: Self-pay

## 2018-07-29 ENCOUNTER — Emergency Department (HOSPITAL_BASED_OUTPATIENT_CLINIC_OR_DEPARTMENT_OTHER)
Admission: EM | Admit: 2018-07-29 | Discharge: 2018-07-29 | Disposition: A | Discharge status: Home / Self Care | Payer: Medicare Other | Attending: Emergency Medicine | Admitting: Emergency Medicine

## 2018-07-29 DIAGNOSIS — S0990XA Unspecified injury of head, initial encounter: Secondary | ICD-10-CM | POA: Diagnosis not present

## 2018-07-29 DIAGNOSIS — W0110XA Fall on same level from slipping, tripping and stumbling with subsequent striking against unspecified object, initial encounter: Secondary | ICD-10-CM | POA: Diagnosis not present

## 2018-07-29 DIAGNOSIS — Y999 Unspecified external cause status: Secondary | ICD-10-CM | POA: Insufficient documentation

## 2018-07-29 DIAGNOSIS — Y9301 Activity, walking, marching and hiking: Secondary | ICD-10-CM | POA: Diagnosis not present

## 2018-07-29 DIAGNOSIS — R918 Other nonspecific abnormal finding of lung field: Secondary | ICD-10-CM | POA: Diagnosis not present

## 2018-07-29 DIAGNOSIS — S0083XA Contusion of other part of head, initial encounter: Secondary | ICD-10-CM

## 2018-07-29 DIAGNOSIS — J949 Pleural condition, unspecified: Secondary | ICD-10-CM | POA: Diagnosis not present

## 2018-07-29 DIAGNOSIS — W19XXXA Unspecified fall, initial encounter: Secondary | ICD-10-CM | POA: Diagnosis not present

## 2018-07-29 DIAGNOSIS — J948 Other specified pleural conditions: Secondary | ICD-10-CM

## 2018-07-29 DIAGNOSIS — M542 Cervicalgia: Secondary | ICD-10-CM | POA: Insufficient documentation

## 2018-07-29 DIAGNOSIS — S199XXA Unspecified injury of neck, initial encounter: Secondary | ICD-10-CM | POA: Diagnosis not present

## 2018-07-29 DIAGNOSIS — Z79899 Other long term (current) drug therapy: Secondary | ICD-10-CM | POA: Insufficient documentation

## 2018-07-29 DIAGNOSIS — Y929 Unspecified place or not applicable: Secondary | ICD-10-CM | POA: Diagnosis not present

## 2018-07-29 DIAGNOSIS — R911 Solitary pulmonary nodule: Secondary | ICD-10-CM | POA: Diagnosis not present

## 2018-07-29 DIAGNOSIS — R296 Repeated falls: Secondary | ICD-10-CM | POA: Diagnosis not present

## 2018-07-29 DIAGNOSIS — R51 Headache: Secondary | ICD-10-CM | POA: Diagnosis not present

## 2018-07-29 DIAGNOSIS — S0093XA Contusion of unspecified part of head, initial encounter: Secondary | ICD-10-CM | POA: Diagnosis not present

## 2018-07-29 DIAGNOSIS — S0993XA Unspecified injury of face, initial encounter: Secondary | ICD-10-CM | POA: Diagnosis not present

## 2018-07-29 LAB — BASIC METABOLIC PANEL
Anion gap: 9 (ref 5–15)
BUN: 13 mg/dL (ref 8–23)
CO2: 27 mmol/L (ref 22–32)
Calcium: 8.7 mg/dL — ABNORMAL LOW (ref 8.9–10.3)
Chloride: 102 mmol/L (ref 98–111)
Creatinine, Ser: 0.47 mg/dL (ref 0.44–1.00)
GFR calc Af Amer: >60 mL/min (ref >60)
GFR calc non Af Amer: >60 mL/min (ref >60)
Glucose, Bld: 97 mg/dL (ref 70–99)
Potassium: 3.9 mmol/L (ref 3.5–5.1)
SODIUM: 138 mmol/L (ref 135–145)

## 2018-07-29 LAB — URINALYSIS, ROUTINE W REFLEX MICROSCOPIC
BILIRUBIN URINE: NEGATIVE
Glucose, UA: NEGATIVE mg/dL
HGB URINE DIPSTICK: NEGATIVE
Ketones, ur: NEGATIVE mg/dL
Leukocytes, UA: NEGATIVE
Nitrite: NEGATIVE
Protein, ur: NEGATIVE mg/dL
pH: 5.5 (ref 5.0–8.0)

## 2018-07-29 LAB — CBC WITH DIFFERENTIAL/PLATELET
Basophils Absolute: 0 10*3/uL (ref 0.0–0.1)
Basophils Relative: 0 %
Eosinophils Absolute: 0.1 10*3/uL (ref 0.0–0.7)
Eosinophils Relative: 1 %
HEMATOCRIT: 39.3 % (ref 36.0–46.0)
HEMOGLOBIN: 13 g/dL (ref 12.0–15.0)
LYMPHS ABS: 2 10*3/uL (ref 0.7–4.0)
LYMPHS PCT: 21 %
MCH: 33.1 pg (ref 26.0–34.0)
MCHC: 33.1 g/dL (ref 30.0–36.0)
MCV: 100 fL (ref 78.0–100.0)
MONOS PCT: 15 %
Monocytes Absolute: 1.4 10*3/uL — ABNORMAL HIGH (ref 0.1–1.0)
NEUTROS ABS: 5.8 10*3/uL (ref 1.7–7.7)
NEUTROS PCT: 63 %
Platelets: 249 10*3/uL (ref 150–400)
RBC: 3.93 MIL/uL (ref 3.87–5.11)
RDW: 12.1 % (ref 11.5–15.5)
WBC: 9.2 10*3/uL (ref 4.0–10.5)

## 2018-07-29 MED ORDER — IOPAMIDOL (ISOVUE-300) INJECTION 61%
100.0000 mL | Freq: Once | INTRAVENOUS | Status: AC | PRN
  Administered 2018-07-29: 80 mL via INTRAVENOUS

## 2018-07-29 NOTE — ED Provider Notes (Signed)
[facility] EMERGENCY DEPARTMENT Provider Note   CSN: [id number] Arrival date & time: 07/29/18  1614     History   Chief Complaint Chief Complaint  Patient presents with  . Facial Injury    HPI Claudia Perez is a 67 y.o. female.  She has a prior history of subdural and this left her with gait instability and some slurred speech.  She has a history of frequent falls and has had a fall yesterday in which she struck her head and face.  There was no loss of consciousness.  She went to [facility] today where they referred her here for imaging.  Her husband states her balance is been getting better since she is been doing therapy although she had 2 falls over the last 2 weeks.  She denies any illness symptoms.  She says she does not have any headache now no blurry vision no double vision no change in her speech.  She does have some mild neck pain increased with movement.  No chest pain shortness of breath abdominal pain nausea vomiting or diarrhea.  The history is provided by the patient and the spouse.  Facial Injury  Mechanism of injury:  Fall Location:  L cheek and forehead Time since incident:  1 day Pain details:    Quality:  Dull   Severity:  Mild   Progression:  Resolved Foreign body present:  No foreign bodies Relieved by:  Nothing Worsened by:  Nothing Ineffective treatments:  None tried Associated symptoms: neck pain   Associated symptoms: no altered mental status, no difficulty breathing, no double vision, no epistaxis, no loss of consciousness, no nausea and no vomiting   Risk factors: frequent falls     Past Medical History:  Diagnosis Date  . Abnormality of gait   . Fall at home 03/27/2013   "lost consciousness; fractured right clavicle" (03/27/2013)  . GERD (gastroesophageal reflux disease)   . Hypercholesteremia   . Hyperthyroidism    "a little bit; don't take RX for it" (03/27/2013)  . Mobility impaired   . Other closed skull fracture without mention  of intracranial injury, unspecified state of consciousness   . Seizures ([place])    "think I might have; been blacking out several times in the past" (03/27/2013)  . Shortness of breath    "even now, just talking" (03/27/2013)  . Subdural hemorrhage ([place])   . Vitamin D deficiency     Patient Active Problem List   Diagnosis Date Noted  . Overactive bladder 08/18/2017  . Faintness   . Left rotator cuff tear 02/01/2015  . Closed left humeral fracture 12/07/2014  . Fatigue 12/07/2014  . 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 Dystonia 08/10/2014  . Syncope and collapse 06/08/2014  . Orthostatic hypotension 06/08/2014  . 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 Fall down stairs 12/11/2013  . Basilar skull fracture ([place]) 12/11/2013  . Traumatic intracranial hemorrhage ([place]) 12/11/2013  . Back pain 12/11/2013  . Subarachnoid hemorrhage following injury ([place]) 12/09/2013  . Syncope 03/27/2013  . Clavicular fracture 03/27/2013  . COUGH 12/28/2007    Past Surgical History:  Procedure Laterality Date  . APPENDECTOMY  1960's  . BRAIN SURGERY  10/2003   SDH evac  . EYE SURGERY  02/2013   cataract extraction w/ intraocular lens implant     OB History  None      Home Medications    Prior to Admission medications   Medication Sig Start Date End Date Taking? Authorizing Provider  acetaminophen (TYLENOL) 500 MG tablet Take 1,000 mg by mouth every 6 (six) hours as needed for moderate pain.    [provider]  citalopram (CELEXA) 10 MG tablet Take 10 mg by mouth every other day.     [provider]  diclofenac sodium (VOLTAREN) 1 % GEL Apply 1 application topically 3 (three) times daily. Shoulders and knees. 11/17/17   Meredith Staggers, MD  MYRBETRIQ 25 MG TB24 tablet TAKE 1 TABLET BY MOUTH EVERY DAY 12/29/17    Meredith Staggers, MD    Family History Family History  Problem Relation Age of Onset  . Hypertension Mother   . Cirrhosis Brother     Social History Social History   Tobacco Use  . Smoking status: Never Smoker  . Smokeless tobacco: Never Used  Substance Use Topics  . Alcohol use: Yes    Comment: 03/27/2013 glass of wine "couple times/yr"  . Drug use: No     Allergies   Nitrofurantoin; Phenazopyridine; Sulfamethoxazole-trimethoprim; and Sulfamethoxazole   Review of Systems Review of Systems  Constitutional: Negative for fever.  HENT: Positive for facial swelling. Negative for nosebleeds and sore throat.   Eyes: Negative for double vision, pain and visual disturbance.  Respiratory: Negative for shortness of breath.   Cardiovascular: Negative for chest pain.  Gastrointestinal: Negative for abdominal pain, nausea and vomiting.  Genitourinary: Negative for dysuria.  Musculoskeletal: Positive for gait problem and neck pain.  Skin: Negative for rash.  Neurological: Negative for loss of consciousness.     Physical Exam Updated Vital Signs BP 118/69 (BP Location: Right Arm)   Pulse 72   Temp 98.5 F (36.9 C) (Oral)   Resp 16   Ht 5\' 7"  (1.702 m)   Wt 45.8 kg   SpO2 99%   BMI 15.82 kg/m   Physical Exam  Constitutional: She appears well-developed and well-nourished.  HENT:  Head: Normocephalic.  Right Ear: External ear normal.  Left Ear: External ear normal.  Nose: Nose normal.  Mouth/Throat: Oropharynx is clear and moist.  She is got a small hematoma on the top of her head and some bruising and swelling over her left zygoma.  Eyes: Pupils are equal, round, and reactive to light. Conjunctivae and EOM are normal.  Neck: Normal range of motion.  She has some mild diffuse cervical tenderness.  Cardiovascular: Normal rate, regular rhythm and normal heart sounds.  Pulmonary/Chest: Effort normal. No stridor. She has no wheezes. She has no rales.  Abdominal: Soft.  She exhibits no mass. There is no tenderness. There is no guarding.  Musculoskeletal: Normal range of motion. She exhibits no deformity.  Neurological: She is alert.  She has a wide-based unsteady gait.  Her husband states this is typical for her.  Skin: Skin is warm and dry. Capillary refill takes less than 2 seconds.  Psychiatric: She has a normal mood and affect.  Nursing note and vitals reviewed.    ED Treatments / Results  Labs (all labs ordered are listed, but only abnormal results are displayed) Labs Reviewed  BASIC METABOLIC PANEL - Abnormal; Notable for the following components:      Result Value   Calcium 8.7 (*)    All other components within normal limits  CBC WITH DIFFERENTIAL/PLATELET - Abnormal; Notable for the following components:   Monocytes Absolute 1.4 (*)  All other components within normal limits  URINALYSIS, ROUTINE W REFLEX MICROSCOPIC - Abnormal; Notable for the following components:   Specific Gravity, Urine <1.005 (*)    All other components within normal limits    EKG None  Radiology Ct Head Wo Contrast  Result Date: 07/29/2018 CLINICAL DATA:  Left-sided headache and facial/neck pain after fall yesterday. EXAM: CT HEAD WITHOUT CONTRAST CT MAXILLOFACIAL WITHOUT CONTRAST CT CERVICAL SPINE WITHOUT CONTRAST TECHNIQUE: Multidetector CT imaging of the head, cervical spine, and maxillofacial structures were performed using the standard protocol without intravenous contrast. Multiplanar CT image reconstructions of the cervical spine and maxillofacial structures were also generated. COMPARISON:  MRI brain dated March 08, 2018. CT head and cervical spine dated May 24, 2015. FINDINGS: CT HEAD FINDINGS Brain: No evidence of acute infarction, hemorrhage, hydrocephalus, extra-axial collection or mass lesion/mass effect. Unchanged bilateral inferior frontal, right temporal, and left parietal encephalomalacia. Stable moderate atrophy. Unchanged right-sided arachnoid cyst  at the vertex. Vascular: No hyperdense vessel or unexpected calcification. Skull: Negative for fracture or focal lesion. Prior left parietal craniotomy. Other: None. CT MAXILLOFACIAL FINDINGS Osseous: No acute fracture or mandibular dislocation. Old displaced fractures of the bilateral mandibular condyles. No destructive process. Orbits: Negative. No traumatic or inflammatory finding. Sinuses: Clear. Soft tissues: Negative. CT CERVICAL SPINE FINDINGS Alignment: Stable 2 mm anterolisthesis at C4-C5. No traumatic malalignment. Skull base and vertebrae: No acute fracture. No primary bone lesion or focal pathologic process. Soft tissues and spinal canal: No prevertebral fluid or swelling. No visible canal hematoma. Disc levels: Mild disc height loss and uncovertebral hypertrophy at C5-C6 and C6-C7, similar to prior study. Upper chest: New nodular pleural thickening in the left apex. Other: None. IMPRESSION: 1. No acute intracranial abnormality. Unchanged bifrontal, right temporal, and left parietal encephalomalacia. 2. No acute maxillofacial fracture. Old displaced fractures of the bilateral mandibular condyle. 3. No acute cervical spine fracture. 4. New nodular pleural thickening in the left lung apex. Recommend dedicated chest CT, with contrast if possible, for further evaluation. Electronically Signed   By: Titus Dubin M.D.   On: 07/29/2018 17:14   Ct Chest W Contrast  Result Date: 07/29/2018 CLINICAL DATA:  Pleural nodules left apex on CT neck today. EXAM: CT CHEST WITH CONTRAST TECHNIQUE: Multidetector CT imaging of the chest was performed during intravenous contrast administration. CONTRAST:  53mL ISOVUE-300 IOPAMIDOL (ISOVUE-300) INJECTION 61% COMPARISON:  CT neck 07/29/2018 FINDINGS: Cardiovascular: Heart size normal. Thoracic aorta normal. Pulmonary arteries normal. Negative for pericardial effusion. Mediastinum/Nodes: Mildly enlarged left hilar lymph nodes measuring 9 mm, 15 mm, 11 mm, suspicious for  tumor. Lungs/Pleura: Pleural based nodules in the left apex as noted on earlier neck CT. Multiple nodules are present measuring up to 12 mm. 2 additional pleural-based nodules in the left lateral apex measuring 13 mm and 14 mm. 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 Right lung is clear. Negative for pneumonia Upper Abdomen: Negative for mass. No acute abnormality. Negative adrenal glands. Musculoskeletal: Negative for fracture. No skeletal metastasis identified. IMPRESSION: Lingular mass highly suspicious for carcinoma lung. Pleural based nodules on the left, also highly suspicious for metastatic disease. No pleural effusion. Right lung is clear. Mildly enlarged left hilar lymph nodes, suspicious for tumor. PET-CT recommended for further evaluation. Electronically Signed   By: Franchot Gallo M.D.   On: 07/29/2018 18:44   Ct Cervical Spine Wo Contrast  Result Date: 07/29/2018 CLINICAL DATA:  Left-sided headache and facial/neck pain after fall yesterday. EXAM: CT HEAD WITHOUT CONTRAST CT MAXILLOFACIAL WITHOUT CONTRAST CT CERVICAL SPINE WITHOUT CONTRAST TECHNIQUE: Multidetector CT imaging of the head, cervical spine, and maxillofacial structures were performed using the standard protocol without intravenous contrast. Multiplanar CT image reconstructions of the cervical spine and maxillofacial structures were also generated. COMPARISON:  MRI brain dated March 08, 2018. CT head and cervical spine dated May 24, 2015. FINDINGS: CT HEAD FINDINGS Brain: No evidence of acute infarction, hemorrhage, hydrocephalus, extra-axial collection or mass lesion/mass effect. Unchanged bilateral inferior frontal, right temporal, and left parietal encephalomalacia. Stable moderate atrophy. Unchanged right-sided arachnoid cyst at the vertex. Vascular: No hyperdense vessel or unexpected calcification. Skull: Negative for  fracture or focal lesion. Prior left parietal craniotomy. Other: None. CT MAXILLOFACIAL FINDINGS Osseous: No acute fracture or mandibular dislocation. Old displaced fractures of the bilateral mandibular condyles. No destructive process. Orbits: Negative. No traumatic or inflammatory finding. Sinuses: Clear. Soft tissues: Negative. CT CERVICAL SPINE FINDINGS Alignment: Stable 2 mm anterolisthesis at C4-C5. No traumatic malalignment. Skull base and vertebrae: No acute fracture. No primary bone lesion or focal pathologic process. Soft tissues and spinal canal: No prevertebral fluid or swelling. No visible canal hematoma. Disc levels: Mild disc height loss and uncovertebral hypertrophy at C5-C6 and C6-C7, similar to prior study. Upper chest: New nodular pleural thickening in the left apex. Other: None. IMPRESSION: 1. No acute intracranial abnormality. Unchanged bifrontal, right temporal, and left parietal encephalomalacia. 2. No acute maxillofacial fracture. Old displaced fractures of the bilateral mandibular condyle. 3. No acute cervical spine fracture. 4. New nodular pleural thickening in the left lung apex. Recommend dedicated chest CT, with contrast if possible, for further evaluation. Electronically Signed   By: Titus Dubin M.D.   On: 07/29/2018 17:14   Ct Maxillofacial Wo Cm  Result Date: 07/29/2018 CLINICAL DATA:  Left-sided headache and facial/neck pain after fall yesterday. EXAM: CT HEAD WITHOUT CONTRAST CT MAXILLOFACIAL WITHOUT CONTRAST CT CERVICAL SPINE WITHOUT CONTRAST TECHNIQUE: Multidetector CT imaging of the head, cervical spine, and maxillofacial structures were performed using the standard protocol without intravenous contrast. Multiplanar CT image reconstructions of the cervical spine and maxillofacial structures were also generated. COMPARISON:  MRI brain dated March 08, 2018. CT head and cervical spine dated May 24, 2015. FINDINGS: CT HEAD FINDINGS Brain: No evidence of acute infarction,  hemorrhage, hydrocephalus, extra-axial collection or mass lesion/mass effect. Unchanged bilateral inferior frontal, right temporal, and left parietal encephalomalacia. Stable moderate atrophy. Unchanged right-sided arachnoid cyst at the vertex. Vascular: No hyperdense vessel or unexpected calcification. Skull: Negative for fracture or focal lesion. Prior left parietal craniotomy. Other: None. CT MAXILLOFACIAL FINDINGS Osseous: No acute fracture or mandibular dislocation. Old displaced fractures of the bilateral mandibular condyles. No destructive process. Orbits: Negative. No traumatic or inflammatory finding. Sinuses: Clear. Soft tissues: Negative. CT CERVICAL SPINE FINDINGS Alignment: Stable 2 mm anterolisthesis at C4-C5. No traumatic malalignment. Skull base and vertebrae: No acute fracture. No primary bone lesion or focal pathologic process. Soft tissues and spinal canal: No prevertebral fluid or swelling. No visible canal hematoma. Disc levels: Mild disc height loss and uncovertebral hypertrophy at C5-C6 and C6-C7, similar to prior study. Upper chest: New nodular pleural thickening in the left apex. Other: None. IMPRESSION: 1. No acute intracranial abnormality. Unchanged bifrontal, right temporal, and left parietal encephalomalacia. 2. No acute maxillofacial fracture. Old displaced fractures of the bilateral mandibular condyle. 3. No acute cervical spine fracture. 4. New nodular pleural thickening in the left lung apex.  Recommend dedicated chest CT, with contrast if possible, for further evaluation. Electronically Signed   By: Titus Dubin M.D.   On: 07/29/2018 17:14    Procedures Procedures (including critical care time)  Medications Ordered in ED Medications - No data to display   Initial Impression / Assessment and Plan / ED Course  I have reviewed the triage vital signs and the nursing notes.  Pertinent labs & imaging results that were available during my care of the patient were reviewed by  me and considered in my medical decision making (see chart for details).  Clinical Course as of Jul 30 945  Fri Jul 29, 2018  1735 reviewed her findings with her and her husband.  I let them know about the findings on the C-spine of some pleural thickening and the recommendation of a chest CT with contrast and they are agreeable to undergo this test.   [MB]  1852 The CT was suspicious for cancer.  I reviewed my concerns with the patient and her husband and recommended that they call her primary care doctor on Tuesday for further work-up.   [MB]    Clinical Course User Index [MB] Hayden Rasmussen, MD     Final Clinical Impressions(s) / ED Diagnoses   Final diagnoses:  Facial contusion, initial encounter  Frequent falls  Mass of lingula of lung  Pleural mass    ED Discharge Orders    None       Hayden Rasmussen, MD 07/30/18 [id number]

## 2018-07-29 NOTE — ED Notes (Signed)
Pt assisted with bed pan

## 2018-07-29 NOTE — Discharge Instructions (Signed)
You were evaluated in the emergency department for head and face injuries from a fall.  You had a CAT scan of your face head and cervical spine that did not show any obvious fractures or bleeding.  During the work-up of your CAT scan you had an abnormal finding in your lung and you also received a CAT scan of your chest.  They saw a mass in the left lung and also some thickening of the surrounding lung lining.  This will need to be followed up with your primary care doctor and involved further testing.  This is possibly cancer and will need more clarification.  I am enclosing the impression from the radiologist of the findings.  IMPRESSION:  Lingular mass highly suspicious for carcinoma lung. Pleural based  nodules on the left, also highly suspicious for metastatic disease.  No pleural effusion. Right lung is clear. Mildly enlarged left hilar  lymph nodes, suspicious for tumor. PET-CT recommended for further  evaluation.

## 2018-07-29 NOTE — ED Notes (Signed)
Attempted IV stick x 2, unsuccessful

## 2018-07-29 NOTE — ED Triage Notes (Signed)
Pt sent from [facility], fell onto face yesterday onto carpet when she lost her balance, c/o left side face and head pain

## 2018-08-04 ENCOUNTER — Ambulatory Visit (HOSPITAL_BASED_OUTPATIENT_CLINIC_OR_DEPARTMENT_OTHER): Payer: Medicare Other | Attending: Internal Medicine | Admitting: Internal Medicine

## 2018-08-04 VITALS — Ht 61.0 in | Wt 101.0 lb

## 2018-08-04 DIAGNOSIS — R0902 Hypoxemia: Secondary | ICD-10-CM | POA: Diagnosis not present

## 2018-08-04 DIAGNOSIS — G4733 Obstructive sleep apnea (adult) (pediatric): Secondary | ICD-10-CM | POA: Diagnosis not present

## 2018-08-04 DIAGNOSIS — R5383 Other fatigue: Secondary | ICD-10-CM

## 2018-08-04 DIAGNOSIS — G4730 Sleep apnea, unspecified: Secondary | ICD-10-CM

## 2018-08-04 DIAGNOSIS — G4710 Hypersomnia, unspecified: Secondary | ICD-10-CM

## 2018-08-04 DIAGNOSIS — R0683 Snoring: Secondary | ICD-10-CM

## 2018-08-06 NOTE — Procedures (Signed)
   NAME: Claudia Perez DATE OF BIRTH:  11/21/1951 MEDICAL RECORD NUMBER [id number]  LOCATION: [facility]  PHYSICIAN: Marius Ditch  DATE OF STUDY: 08/04/2018  SLEEP STUDY TYPE: Nocturnal Polysomnogram               REFERRING PHYSICIAN: Marius Ditch, MD  INDICATION FOR STUDY: snoring, awakening gasping for breath; h/o TBI with SDH  EPWORTH SLEEPINESS SCORE:  13 HEIGHT: 5\' 1"  (154.9 cm)  WEIGHT: 101 lb (45.8 kg)    Body mass index is 19.08 kg/m.  NECK SIZE: 12 in.  MEDICATIONS  Patient self administered medications include: N/A. Medications administered during study include No sleep medicine administered.Marland Kitchen   SLEEP STUDY TECHNIQUE  A multi-channel overnight Polysomnography study was performed. The channels recorded and monitored were central and occipital EEG, electrooculogram (EOG), submentalis EMG (chin), nasal and oral airflow, thoracic and abdominal wall motion, anterior tibialis EMG, snore microphone, electrocardiogram, and a pulse oximetry.   TECHNICAL COMMENTS  Comments added by Technician: The patient was accompanied by an aid who assisted with transfer and other care. The patient was unable to sleep supine. Comments added by Scorer: N/A   SLEEP ARCHITECTURE  The study was initiated at 9:46:40 PM and terminated at 5:00:49 AM. The total recorded time was 434.2 minutes. EEG confirmed total sleep time was 263.9 minutes yielding a sleep efficiency of 60.8%%. Sleep onset after lights out was 69.7 minutes with a REM latency of 49.5 minutes. The patient spent 16.3%% of the night in stage N1 sleep, 27.1%% in stage N2 sleep, 31.6%% in stage N3 and 25% in REM. Wake after sleep onset (WASO) was 100.5 minutes. The Arousal Index was 20.9/hour.   RESPIRATORY PARAMETERS  There were a total of 57 respiratory disturbances out of which 25 were apneas ( 12 obstructive, 0 mixed, 13 central) and 32 hypopneas. The apnea/hypopnea index (AHI) was 13.0 events/hour. The central  sleep apnea index was 3.0 events/hour. The REM AHI was 4.5 events/hour and NREM AHI was 15.8 events/hour. The supine AHI was N/A events/hour and the non supine AHI was 13/hour. RDI was 21/hr and REM related RDI was 13.6/hr. Respiratory disturbances were associated with oxygen desaturation down to a nadir of 85.0% during sleep. The mean oxygen saturation during the study was 92.9%. The cumulative time under 88% oxygen saturation was 5.5 minutes.  LEG MOVEMENT DATA  The total leg movements were 0 with a resulting leg movement index of 0.0/hr . Associated arousal with leg movement index was 0.0/hr.   CARDIAC DATA  The underlying cardiac rhythm was most consistent with sinus rhythm. Mean heart rate during sleep was 68.9 bpm. Additional  rhythm abnormalities include None.   IMPRESSIONS  Mild Obstructive Sleep apnea(OSA) with few central events as well. Marland Kitchen  DIAGNOSIS  Obstructive Sleep Apnea (327.23 [G47.33 ICD-10])  Nocturnal Hypoxemia (327.26 [G47.36 ICD-10])  RECOMMENDATIONS  Therapeutic CPAP titration to determine optimal pressure required to alleviate sleep disordered breathing.  Marius Ditch Sleep specialist, [place] Board [employer] Medicine  ELECTRONICALLY SIGNED ON:  08/06/2018, 8:42 PM [place] PH: [phone]   FX: [phone] [place]

## 2018-08-11 DIAGNOSIS — G259 Extrapyramidal and movement disorder, unspecified: Secondary | ICD-10-CM | POA: Diagnosis not present

## 2018-08-11 DIAGNOSIS — R2681 Unsteadiness on feet: Secondary | ICD-10-CM | POA: Diagnosis not present

## 2018-08-11 DIAGNOSIS — Z23 Encounter for immunization: Secondary | ICD-10-CM | POA: Diagnosis not present

## 2018-08-11 DIAGNOSIS — Z8679 Personal history of other diseases of the circulatory system: Secondary | ICD-10-CM | POA: Diagnosis not present

## 2018-08-11 DIAGNOSIS — R269 Unspecified abnormalities of gait and mobility: Secondary | ICD-10-CM | POA: Diagnosis not present

## 2018-08-11 DIAGNOSIS — R9389 Abnormal findings on diagnostic imaging of other specified body structures: Secondary | ICD-10-CM | POA: Diagnosis not present

## 2018-08-11 DIAGNOSIS — R296 Repeated falls: Secondary | ICD-10-CM | POA: Diagnosis not present

## 2018-08-11 DIAGNOSIS — R479 Unspecified speech disturbances: Secondary | ICD-10-CM | POA: Diagnosis not present

## 2018-08-15 ENCOUNTER — Other Ambulatory Visit (HOSPITAL_COMMUNITY): Payer: Self-pay | Admitting: Family Medicine

## 2018-08-15 DIAGNOSIS — R9389 Abnormal findings on diagnostic imaging of other specified body structures: Secondary | ICD-10-CM

## 2018-08-16 ENCOUNTER — Other Ambulatory Visit (HOSPITAL_BASED_OUTPATIENT_CLINIC_OR_DEPARTMENT_OTHER): Payer: Self-pay

## 2018-08-16 DIAGNOSIS — G4733 Obstructive sleep apnea (adult) (pediatric): Secondary | ICD-10-CM

## 2018-08-25 ENCOUNTER — Telehealth: Payer: Self-pay | Admitting: Internal Medicine

## 2018-08-25 NOTE — Telephone Encounter (Signed)
Lft the pt a vm to return my call to schedule an appt with Dr. Julien Nordmann

## 2018-08-26 ENCOUNTER — Encounter: Payer: Self-pay | Admitting: Internal Medicine

## 2018-08-26 ENCOUNTER — Telehealth: Payer: Self-pay | Admitting: Internal Medicine

## 2018-08-26 NOTE — Telephone Encounter (Signed)
New referral received from Dr. Drema Dallas for lung mass. Pt has been scheduled to see Dr. Julien Nordmann on 10/1 at 2pm. Pt aware to arrive 30 minutes early. Letter mailed.

## 2018-08-29 ENCOUNTER — Ambulatory Visit (HOSPITAL_COMMUNITY)
Admission: RE | Admit: 2018-08-29 | Discharge: 2018-08-29 | Disposition: A | Discharge status: Home / Self Care | Payer: Medicare Other | Source: Ambulatory Visit | Attending: Family Medicine | Admitting: Family Medicine

## 2018-08-29 ENCOUNTER — Other Ambulatory Visit: Payer: Self-pay | Admitting: Medical Oncology

## 2018-08-29 DIAGNOSIS — R918 Other nonspecific abnormal finding of lung field: Secondary | ICD-10-CM | POA: Insufficient documentation

## 2018-08-29 DIAGNOSIS — S72002A Fracture of unspecified part of neck of left femur, initial encounter for closed fracture: Secondary | ICD-10-CM | POA: Insufficient documentation

## 2018-08-29 DIAGNOSIS — R9389 Abnormal findings on diagnostic imaging of other specified body structures: Secondary | ICD-10-CM

## 2018-08-29 DIAGNOSIS — R937 Abnormal findings on diagnostic imaging of other parts of musculoskeletal system: Secondary | ICD-10-CM | POA: Diagnosis not present

## 2018-08-29 LAB — GLUCOSE, CAPILLARY: Glucose-Capillary: 103 mg/dL — ABNORMAL HIGH (ref 70–99)

## 2018-08-29 MED ORDER — FLUDEOXYGLUCOSE F - 18 (FDG) INJECTION
5.0000 | Freq: Once | INTRAVENOUS | Status: AC | PRN
  Administered 2018-08-29: 5 via INTRAVENOUS

## 2018-08-30 ENCOUNTER — Ambulatory Visit: Payer: Medicare Other | Admitting: Internal Medicine

## 2018-08-30 ENCOUNTER — Telehealth: Payer: Self-pay | Admitting: Internal Medicine

## 2018-08-30 ENCOUNTER — Other Ambulatory Visit: Payer: Medicare Other

## 2018-08-30 ENCOUNTER — Inpatient Hospital Stay: Payer: Medicare Other

## 2018-08-30 ENCOUNTER — Inpatient Hospital Stay: Payer: Medicare Other | Attending: Internal Medicine | Admitting: Internal Medicine

## 2018-08-30 ENCOUNTER — Encounter: Payer: Self-pay | Admitting: Internal Medicine

## 2018-08-30 DIAGNOSIS — R918 Other nonspecific abnormal finding of lung field: Secondary | ICD-10-CM

## 2018-08-30 DIAGNOSIS — C7951 Secondary malignant neoplasm of bone: Secondary | ICD-10-CM | POA: Diagnosis not present

## 2018-08-30 DIAGNOSIS — R911 Solitary pulmonary nodule: Secondary | ICD-10-CM

## 2018-08-30 DIAGNOSIS — C3490 Malignant neoplasm of unspecified part of unspecified bronchus or lung: Secondary | ICD-10-CM

## 2018-08-30 DIAGNOSIS — C3412 Malignant neoplasm of upper lobe, left bronchus or lung: Secondary | ICD-10-CM | POA: Diagnosis not present

## 2018-08-30 LAB — CMP (CANCER CENTER ONLY)
ALT: 13 U/L (ref 0–44)
ANION GAP: 8 (ref 5–15)
AST: 20 U/L (ref 15–41)
Albumin: 4 g/dL (ref 3.5–5.0)
Alkaline Phosphatase: 89 U/L (ref 38–126)
BILIRUBIN TOTAL: 0.3 mg/dL (ref 0.3–1.2)
BUN: 16 mg/dL (ref 8–23)
CO2: 27 mmol/L (ref 22–32)
Calcium: 9.6 mg/dL (ref 8.9–10.3)
Chloride: 111 mmol/L (ref 98–111)
Creatinine: 0.7 mg/dL (ref 0.44–1.00)
GFR, Est AFR Am: >60 mL/min (ref >60)
Glucose, Bld: 96 mg/dL (ref 70–99)
POTASSIUM: 4.5 mmol/L (ref 3.5–5.1)
Sodium: 146 mmol/L — ABNORMAL HIGH (ref 135–145)
Total Protein: 7.3 g/dL (ref 6.5–8.1)

## 2018-08-30 LAB — CBC WITH DIFFERENTIAL (CANCER CENTER ONLY)
BASOS ABS: 0 10*3/uL (ref 0.0–0.1)
BASOS PCT: 0 %
EOS PCT: 0 %
Eosinophils Absolute: 0 10*3/uL (ref 0.0–0.5)
HCT: 37.9 % (ref 34.8–46.6)
Hemoglobin: 12.7 g/dL (ref 11.6–15.9)
LYMPHS PCT: 16 %
Lymphs Abs: 1.4 10*3/uL (ref 0.9–3.3)
MCH: 32.9 pg (ref 25.1–34.0)
MCHC: 33.5 g/dL (ref 31.5–36.0)
MCV: 98.2 fL (ref 79.5–101.0)
Monocytes Absolute: 0.7 10*3/uL (ref 0.1–0.9)
Monocytes Relative: 7 %
Neutro Abs: 7 10*3/uL — ABNORMAL HIGH (ref 1.5–6.5)
Neutrophils Relative %: 77 %
PLATELETS: 272 10*3/uL (ref 145–400)
RBC: 3.86 MIL/uL (ref 3.70–5.45)
RDW: 12.8 % (ref 11.2–14.5)
WBC Count: 9.1 10*3/uL (ref 3.9–10.3)

## 2018-08-30 MED ORDER — OXYCODONE-ACETAMINOPHEN 5-325 MG PO TABS: 1.0000 | ORAL_TABLET | Freq: Three times a day (TID) | ORAL | 0 refills | Status: DC | PRN

## 2018-08-30 NOTE — Progress Notes (Signed)
[facility] [phone]   [phone]  CONSULT NOTE  REFERRING PHYSICIAN: Dr. Leighton Ruff  REASON FOR CONSULTATION:  67 years old African female with suspicious lung cancer  HPI Claudia Perez is a 67 y.o. female with past medical history significant for subdural hematoma in 2004 after a fall.  The patient also had frequent falls since that time.  She has a history of GERD and hypothyroidism.  She is a never smoker.  She had a fall last months and presented to the emergency department on 07/29/2018.  She had CT scan of the head, cervical spine as well as maxillofacial at that time and that showed no intracranial abnormality with unchanged bifrontal, right temporal and left parietal encephalomalacia.  There was new nodular pleural thickening in the left lung apex.  This was followed by CT scan of the chest on the same day and that showed irregular spiculated mass in the lingula measuring 2.4 x 2.7 cm highly suspicious for carcinoma of the lung with pleural metastasis.  There was also pleural-based nodules in the left apex and multiple nodules present measuring up to 1.2 cm.  There was 2 additional pleural-based nodules in the left lateral apex measuring 1.3 cm and 1.4 cm.  The scan also showed mildly enlarged left hilar lymph node measuring 0.9, 1.5 and 1.1 cm suspicious for tumor. A PET scan was performed on 08/29/2018 and that showed 3.0 cm left upper lobe pulmonary mass hypermetabolic and likely primary lung neoplasm with associated left hilar lymphadenopathy.  There was numerous left-sided pleural lesions likely metastatic disease.  There was also left humeral neck fracture, ninth and 10th anterior rib abnormalities and possible cortical lesion involving the right femoral neck suspicious for metastatic disease.  No findings of metastatic disease involving the abdomen/pelvis. The patient was referred to me today for evaluation and recommendation regarding her  condition. When seen today she continues to complain of pain in the left neck area as well as left shoulder.  She also has shortness of breath and cough productive of clear sputum with no hemoptysis.  She denied having any nausea, vomiting, diarrhea or constipation.  She denied having any significant visual changes but she has intermittent headache.  She lost around 8 pounds in the last few months. Family history significant for mother with hypertension.  The patient is married and has 1 daughter.  She was accompanied by her husband, Admasu Kebede.  She used to work for the city of [place] until 2004 and quit after her fall and subdural hematoma.  She has no history for smoking or exposure to asbestos.  She has no history of alcohol or drug abuse.  HPI  Past Medical History:  Diagnosis Date  . Abnormality of gait   . Fall at home 03/27/2013   "lost consciousness; fractured right clavicle" (03/27/2013)  . GERD (gastroesophageal reflux disease)   . Hypercholesteremia   . Hyperthyroidism    "a little bit; don't take RX for it" (03/27/2013)  . Mobility impaired   . Other closed skull fracture without mention of intracranial injury, unspecified state of consciousness   . Seizures ([place])    "think I might have; been blacking out several times in the past" (03/27/2013)  . Shortness of breath    "even now, just talking" (03/27/2013)  . Subdural hemorrhage ([place])   . Vitamin D deficiency     Past Surgical History:  Procedure Laterality Date  . APPENDECTOMY  1960's  . BRAIN  SURGERY  10/2003   SDH evac  . EYE SURGERY  02/2013   cataract extraction w/ intraocular lens implant    Family History  Problem Relation Age of Onset  . Hypertension Mother   . Cirrhosis Brother     Social History Social History   Tobacco Use  . Smoking status: Never Smoker  . Smokeless tobacco: Never Used  Substance Use Topics  . Alcohol use: Yes    Comment: 03/27/2013 glass of wine "couple times/yr"  . Drug use:  No    Allergies  Allergen Reactions  . Nitrofurantoin Itching  . Phenazopyridine Other (See Comments)  . Sulfamethoxazole-Trimethoprim Itching    Bactrim  . Sulfamethoxazole Rash    Current Outpatient Medications  Medication Sig Dispense Refill  . acetaminophen (TYLENOL) 500 MG tablet Take 1,000 mg by mouth every 6 (six) hours as needed for moderate pain.    . Vitamin D, Ergocalciferol, (DRISDOL) 50000 units CAPS capsule Take 1 capsule by mouth daily.     No current facility-administered medications for this visit.     Review of Systems  Constitutional: positive for fatigue and weight loss Eyes: negative Ears, nose, mouth, throat, and face: negative Respiratory: positive for cough and dyspnea on exertion Cardiovascular: negative Gastrointestinal: negative Genitourinary:negative Integument/breast: negative Hematologic/lymphatic: negative Musculoskeletal:positive for bone pain and neck pain Neurological: negative Behavioral/Psych: negative Endocrine: negative Allergic/Immunologic: negative  Physical Exam  HWE:XHBZJ, healthy, no distress, ill looking and malnourished SKIN: skin color, texture, turgor are normal, no rashes or significant lesions HEAD: Normocephalic, No masses, lesions, tenderness or abnormalities EYES: normal, PERRLA, Conjunctiva are pink and non-injected EARS: External ears normal, Canals clear OROPHARYNX:no exudate, no erythema and lips, buccal mucosa, and tongue normal  NECK: supple, no adenopathy, no JVD LYMPH:  no palpable lymphadenopathy, no hepatosplenomegaly BREAST:not examined LUNGS: clear to auscultation , and palpation HEART: regular rate & rhythm, no murmurs and no gallops ABDOMEN:abdomen soft, non-tender, normal bowel sounds and no masses or organomegaly BACK: No CVA tenderness, Range of motion is normal EXTREMITIES:no joint deformities, effusion, or inflammation, no edema  NEURO: alert & oriented x 3 with fluent speech, no focal  motor/sensory deficits  PERFORMANCE STATUS: ECOG 1  LABORATORY DATA: Lab Results  Component Value Date   WBC 9.1 08/30/2018   HGB 12.7 08/30/2018   HCT 37.9 08/30/2018   MCV 98.2 08/30/2018   PLT 272 08/30/2018      Chemistry      Component Value Date/Time   NA 138 07/29/2018 1710   K 3.9 07/29/2018 1710   CL 102 07/29/2018 1710   CO2 27 07/29/2018 1710   BUN 13 07/29/2018 1710   CREATININE 0.47 07/29/2018 1710   CREATININE 0.73 06/08/2014 1215      Component Value Date/Time   CALCIUM 8.7 (L) 07/29/2018 1710   ALKPHOS 77 06/08/2014 1215   AST 20 06/08/2014 1215   ALT 13 06/08/2014 1215   BILITOT 0.3 06/08/2014 1215       RADIOGRAPHIC STUDIES: Nm Pet Image Initial (pi) Skull Base To Thigh  Result Date: 08/29/2018 CLINICAL DATA:  Initial treatment strategy for left lung mass and multiple left pleural nodules. EXAM: NUCLEAR MEDICINE PET SKULL BASE TO THIGH TECHNIQUE: 5.0 mCi F-18 FDG was injected intravenously. Full-ring PET imaging was performed from the skull base to thigh after the radiotracer. CT data was obtained and used for attenuation correction and anatomic localization. Fasting blood glucose: 103 mg/dl COMPARISON:  CT scan 07/29/2018 FINDINGS: Mediastinal blood pool activity: SUV max 2.44 NECK:  No hypermetabolic lymph nodes in the neck. Incidental CT findings: CHEST: The 3 cm left upper lobe pulmonary mass is hypermetabolic with SUV max of 97.98. Associated left hilar lymphadenopathy is hypermetabolic with SUV max of 92.11. There are also several pleural nodules involving the left hemithorax. The largest measures 2.8 cm in the left upper lobe and has an SUV max of 30.68. Several other pleural nodules at the left lung apex are hypermetabolic. No other worrisome pulmonary nodules. No breast masses, supraclavicular or axillary lymphadenopathy. Incidental CT findings: Small left pleural effusion. ABDOMEN/PELVIS: No abnormal hypermetabolic activity within the liver, pancreas,  adrenal glands, or spleen. No hypermetabolic lymph nodes in the abdomen or pelvis. Incidental CT findings: Calcified uterine fibroids are noted. SKELETON: There is a fracture of the left humeral neck. Mild hypermetabolism is noted but could be related to healing. SUV max is 3.93. There is also mild hypermetabolism in the left ninth and tenth anterior ribs without obvious fracture. Could not exclude metastatic disease. SUV max is 3.18. Area of hypermetabolism involving the anterior cortex of the right femoral neck with SUV max of 4.0. No obvious CT correlate. Incidental CT findings: none IMPRESSION: 1. 3 cm left upper lobe pulmonary mass is hypermetabolic and likely primary lung neoplasm. Associated left hilar lymphadenopathy. 2. Numerous left-sided pleural lesions are likely metastatic disease. 3. Left humeral neck fracture, ninth and tenth anterior rib abnormalities and a possible cortical lesion involving the right femoral neck are suspicious for metastatic disease. Low level hypermetabolism. 4. No findings for metastatic disease involving the abdomen/pelvis. Electronically Signed   By: Marijo Sanes M.D.   On: 08/29/2018 15:14    ASSESSMENT: This is a very pleasant 67 years old African female originally from [place], and never smoker presented with probably stage IV (T2 a, N2, M1c) lung cancer highly suspicious for non-small cell carcinoma and probably adenocarcinoma, pending further staging work-up and tissue diagnosis.  She presented with left upper lobe lung mass in addition to multiple pleural-based nodules as well as suspicious bone metastasis to the left humeral neck, left ninth and 10th ribs as well as right femoral neck.   PLAN: I had a lengthy discussion with the patient and her husband today about her current disease of stage, prognosis and treatment options. I personally and independently reviewed the scan images and discussed the result and showed the images to the patient and her husband. I  recommended for the patient to complete the staging work-up by ordering MRI of the brain to rule out brain metastasis. I also referred the patient to interventional radiology for consideration of CT-guided core biopsy of 1 of the pleural-based nodules for confirmation of the tissue diagnosis. For pain management I gave the patient prescription for Percocet 5/325 mg p.o. every 8 hours as needed for pain. I will see the patient back for follow-up visit in 2-3 weeks for evaluation and more detailed discussion of her treatment options based on the biopsy results and staging work-up. She was advised to call immediately if she has any concerning symptoms in the interval. The patient voices understanding of current disease status and treatment options and is in agreement with the current care plan.  All questions were answered. The patient knows to call the clinic with any problems, questions or concerns. We can certainly see the patient much sooner if necessary.  Thank you so much for allowing me to participate in the care of Sya Ohnemus. I will continue to follow up the patient with you and assist  in her care.  I spent 40 minutes counseling the patient face to face. The total time spent in the appointment was 60 minutes.  Disclaimer: This note was dictated with voice recognition software. Similar sounding words can inadvertently be transcribed and may not be corrected upon review.   Eilleen Kempf August 30, 2018, 2:01 PM

## 2018-08-30 NOTE — Telephone Encounter (Signed)
Appts scheduled AVS/Calendar printed per 10/1 los

## 2018-09-01 DIAGNOSIS — R942 Abnormal results of pulmonary function studies: Secondary | ICD-10-CM | POA: Diagnosis not present

## 2018-09-06 ENCOUNTER — Other Ambulatory Visit: Payer: Self-pay | Admitting: Radiology

## 2018-09-06 ENCOUNTER — Other Ambulatory Visit: Payer: Self-pay | Admitting: Physician Assistant

## 2018-09-06 NOTE — Progress Notes (Signed)
PA for oxycodone-acetaminophen 5-325 tabs has been submitted.

## 2018-09-07 ENCOUNTER — Ambulatory Visit (HOSPITAL_COMMUNITY)
Admission: RE | Admit: 2018-09-07 | Discharge: 2018-09-07 | Disposition: A | Discharge status: Home / Self Care | Payer: Medicare Other | Source: Ambulatory Visit | Attending: Interventional Radiology | Admitting: Interventional Radiology

## 2018-09-07 ENCOUNTER — Ambulatory Visit (HOSPITAL_COMMUNITY)
Admission: RE | Admit: 2018-09-07 | Discharge: 2018-09-07 | Disposition: A | Discharge status: Home / Self Care | Payer: Medicare Other | Source: Ambulatory Visit | Attending: Internal Medicine | Admitting: Internal Medicine

## 2018-09-07 ENCOUNTER — Other Ambulatory Visit: Payer: Self-pay

## 2018-09-07 ENCOUNTER — Encounter (HOSPITAL_COMMUNITY): Payer: Self-pay

## 2018-09-07 DIAGNOSIS — C3491 Malignant neoplasm of unspecified part of right bronchus or lung: Secondary | ICD-10-CM | POA: Diagnosis not present

## 2018-09-07 DIAGNOSIS — R296 Repeated falls: Secondary | ICD-10-CM | POA: Insufficient documentation

## 2018-09-07 DIAGNOSIS — C3492 Malignant neoplasm of unspecified part of left bronchus or lung: Secondary | ICD-10-CM | POA: Diagnosis not present

## 2018-09-07 DIAGNOSIS — Z79899 Other long term (current) drug therapy: Secondary | ICD-10-CM | POA: Insufficient documentation

## 2018-09-07 DIAGNOSIS — F809 Developmental disorder of speech and language, unspecified: Secondary | ICD-10-CM | POA: Insufficient documentation

## 2018-09-07 DIAGNOSIS — R918 Other nonspecific abnormal finding of lung field: Secondary | ICD-10-CM | POA: Diagnosis not present

## 2018-09-07 DIAGNOSIS — C384 Malignant neoplasm of pleura: Secondary | ICD-10-CM | POA: Diagnosis not present

## 2018-09-07 DIAGNOSIS — C3490 Malignant neoplasm of unspecified part of unspecified bronchus or lung: Secondary | ICD-10-CM | POA: Diagnosis present

## 2018-09-07 DIAGNOSIS — Z8249 Family history of ischemic heart disease and other diseases of the circulatory system: Secondary | ICD-10-CM | POA: Insufficient documentation

## 2018-09-07 DIAGNOSIS — Z9889 Other specified postprocedural states: Secondary | ICD-10-CM

## 2018-09-07 DIAGNOSIS — J948 Other specified pleural conditions: Secondary | ICD-10-CM | POA: Diagnosis not present

## 2018-09-07 LAB — CBC
HCT: 42.2 % (ref 36.0–46.0)
Hemoglobin: 13.4 g/dL (ref 12.0–15.0)
MCH: 32.1 pg (ref 26.0–34.0)
MCHC: 31.8 g/dL (ref 30.0–36.0)
MCV: 101 fL — ABNORMAL HIGH (ref 80.0–100.0)
NRBC: 0 % (ref 0.0–0.2)
PLATELETS: 342 10*3/uL (ref 150–400)
RBC: 4.18 MIL/uL (ref 3.87–5.11)
RDW: 13 % (ref 11.5–15.5)
WBC: 8.5 10*3/uL (ref 4.0–10.5)

## 2018-09-07 MED ORDER — NALOXONE HCL 0.4 MG/ML IJ SOLN
INTRAMUSCULAR | Status: AC
  Filled 2018-09-07: qty 1

## 2018-09-07 MED ORDER — FENTANYL CITRATE (PF) 100 MCG/2ML IJ SOLN
INTRAMUSCULAR | Status: AC | PRN
  Administered 2018-09-07: 25 ug via INTRAVENOUS

## 2018-09-07 MED ORDER — SODIUM CHLORIDE 0.9 % IV SOLN: INTRAVENOUS | Status: DC

## 2018-09-07 MED ORDER — MIDAZOLAM HCL 2 MG/2ML IJ SOLN
INTRAMUSCULAR | Status: AC | PRN
  Administered 2018-09-07: 1 mg via INTRAVENOUS

## 2018-09-07 MED ORDER — MIDAZOLAM HCL 2 MG/2ML IJ SOLN
INTRAMUSCULAR | Status: AC
  Filled 2018-09-07: qty 4

## 2018-09-07 MED ORDER — FENTANYL CITRATE (PF) 100 MCG/2ML IJ SOLN
INTRAMUSCULAR | Status: AC
  Filled 2018-09-07: qty 4

## 2018-09-07 MED ORDER — FLUMAZENIL 0.5 MG/5ML IV SOLN
INTRAVENOUS | Status: AC
  Filled 2018-09-07: qty 5

## 2018-09-07 NOTE — Discharge Instructions (Signed)
Lung Biopsy, Care After °This sheet gives you information about how to care for yourself after your procedure. Your health care provider may also give you more specific instructions depending on the type of biopsy you had. If you have problems or questions, contact your health care provider. °What can I expect after the procedure? °After the procedure, it is common to have: °· A cough. °· A sore throat. °· Pain where a needle, bronchoscope, or incision was used to collect a biopsy sample (biopsy site). ° °Follow these instructions at home: °Medicines °· Take over-the-counter and prescription medicines only as told by your health care provider. °· Do not drive for 24 hours if you were given a sedative. °· Do not drink alcohol while taking pain medicine. °· Do not drive or use heavy machinery while taking prescription pain medicine. °· To prevent or treat constipation while you are taking prescription pain medicine, your health care provider may recommend that you: °? Drink enough fluid to keep your urine clear or pale yellow. °? Take over-the-counter or prescription medicines. °? Eat foods that are high in fiber, such as fresh fruits and vegetables, whole grains, and beans. °? Limit foods that are high in fat and processed sugars, such as fried and sweet foods. °Activity °· If you had an incision during your procedure, avoid activities that may pull the incision site open. °· Return to your normal activities as told by your health care provider. Ask your health care provider what activities are safe for you. °If you had an open biopsy:  °· Follow instructions from your health care provider about how to take care of your incision. Make sure you: °? Wash your hands with soap and water before you change your bandage (dressing). If soap and water are not available, use hand sanitizer. °? Change your dressing as told by your health care provider. °? Leave stitches (sutures), skin glue, or adhesive strips in place. These  skin closures may need to stay in place for 2 weeks or longer. If adhesive strip edges start to loosen and curl up, you may trim the loose edges. Do not remove adhesive strips completely unless your health care provider tells you to do that. °· Check your incision area every day for signs of infection. Check for: °? Redness, swelling, or pain. °? Fluid or blood. °? Warmth. °? Pus or a bad smell. °General instructions °· It is up to you to get the results of your procedure. Ask your health care provider, or the department that is doing the procedure, when your results will be ready. °Contact a health care provider if: °· You have a fever. °· You have redness, swelling, or pain around your biopsy site. °· You have fluid or blood coming from your biopsy site. °· Your biopsy site feels warm to the touch. °· You have pus or a bad smell coming from your biopsy site. °Get help right away if: °· You cough up blood. °· You have trouble breathing. °· You have chest pain. °Summary °· After the procedure, it is common to have a sore throat and a cough. °· Return to your normal activities as told by your health care provider. Ask your health care provider what activities are safe for you. °· Take over-the-counter and prescription medicines only as told by your health care provider. °· Report any unusual symptoms to your health care provider. °This information is not intended to replace advice given to you by your health care provider. Make sure   you discuss any questions you have with your health care provider. °Document Released: 12/15/2016 Document Revised: 12/15/2016 Document Reviewed: 12/15/2016 °[employer]vier Interactive Patient Education © 2018 [employer] ° °

## 2018-09-07 NOTE — Procedures (Signed)
CT guided L apical pleural mass core Bx 18 g times three EBL 0 Comp 0

## 2018-09-07 NOTE — H&P (Signed)
Chief Complaint: Patient was seen in consultation today for lung nodule  Referring Physician(s): Mohamed,Mohamed  Supervising Physician: Marybelle Killings  Patient Status: [facility] - Out-pt  History of Present Illness: Claudia Perez is a 67 y.o. female with past medical history of GERD, subdural hemorrhage related to fall at home in 2004.  Patient continues with frequent falls at home for which she has presented to the ED several times. At a recent ED visit, a CT scan of the chest showed an irregular spiculated mass suspicious for lung cancer.  Patient was referred to Oncology who has evaluated patient and is now requesting lung biopsy.   Patient case reviewed and approved by Dr. Barbie Banner.  She presents today in her usual state of health.  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  She is able to provider accurate history and demonstrate understanding of plans today.  She has been NPO.  She does not take blood thinners.   Past Medical History:  Diagnosis Date  . Abnormality of gait   . Fall at home 03/27/2013   "lost consciousness; fractured right clavicle" (03/27/2013)  . GERD (gastroesophageal reflux disease)   . Hypercholesteremia   . Hyperthyroidism    "a little bit; don't take RX for it" (03/27/2013)  . Mobility impaired   . Other closed skull fracture without mention of intracranial injury, unspecified state of consciousness   . Seizures ([place])    "think I might have; been blacking out several times in the past" (03/27/2013)  . Shortness of breath    "even now, just talking" (03/27/2013)  . Subdural hemorrhage ([place])   . Vitamin D deficiency     Past Surgical History:  Procedure Laterality Date  . APPENDECTOMY  1960's  . BRAIN SURGERY  10/2003   SDH evac  . EYE SURGERY  02/2013   cataract extraction w/ intraocular lens implant    Allergies: Nitrofurantoin; Phenazopyridine;  Sulfamethoxazole-trimethoprim; and Sulfamethoxazole  Medications: Prior to Admission medications   Medication Sig Start Date End Date Taking? Authorizing Provider  acetaminophen (TYLENOL) 500 MG tablet Take 1,000 mg by mouth daily.    Yes [provider]  Multiple Vitamin (MULTIVITAMIN) capsule Take 1 capsule by mouth daily.   Yes [provider]  Vitamin D, Ergocalciferol, 2000 units CAPS Take 1 capsule by mouth daily.  03/27/14  Yes [provider]  oxyCODONE-acetaminophen (PERCOCET/ROXICET) 5-325 MG tablet Take 1 tablet by mouth every 8 (eight) hours as needed for severe pain. 08/30/18   Curt Bears, MD     Family History  Problem Relation Age of Onset  . Hypertension Mother   . Cirrhosis Brother     Social History   Socioeconomic History  . Marital status: Married    Spouse name: Not on file  . Number of children: Not on file  . Years of education: Not on file  . Highest education level: Not on file  Occupational History  . Not on file  Social Needs  . Financial resource strain: Not on file  . Food insecurity:    Worry: Not on file    Inability: Not on file  . Transportation needs:    Medical: Not on file    Non-medical: Not on file  Tobacco Use  . Smoking status: Never Smoker  . Smokeless tobacco: Never Used  Substance and Sexual Activity  . Alcohol use: Yes    Comment: 03/27/2013 glass of wine "couple times/yr"  .  Drug use: No  . Sexual activity: Not Currently  Lifestyle  . Physical activity:    Days per week: Not on file    Minutes per session: Not on file  . Stress: Not on file  Relationships  . Social connections:    Talks on phone: Not on file    Gets together: Not on file    Attends religious service: Not on file    Active member of club or organization: Not on file    Attends meetings of clubs or organizations: Not on file    Relationship status: Not on file  Other Topics Concern  . Not on file  Social History  Narrative  . Not on file     Review of Systems: A 12 point ROS discussed and pertinent positives are indicated in the HPI above.  All other systems are negative.  Review of Systems  Constitutional: Negative for fatigue and fever.  Respiratory: Positive for cough (chronic per pt). Negative for shortness of breath.   Cardiovascular: Negative for chest pain.  Gastrointestinal: Negative for abdominal pain.  Musculoskeletal: Negative for back pain.  Psychiatric/Behavioral: Negative for behavioral problems and confusion.    Vital Signs: BP 138/72   Pulse 68   Temp (!) 97.5 F (36.4 C)   Resp 20   Ht 5\' 1"  (1.549 m)   Wt 101 lb 13.6 oz (46.2 kg)   SpO2 98%   BMI 19.24 kg/m   Physical Exam  Constitutional: She is oriented to person, place, and time. She appears well-developed. No distress.  Cardiovascular: Normal rate, regular rhythm and normal heart sounds.  Pulmonary/Chest: Effort normal and breath sounds normal. No respiratory distress.  Abdominal: Soft. She exhibits no distension. There is no tenderness.  Neurological: She is alert and oriented to person, place, and time.  Slowed speech, but cognitively intact  Skin: Skin is warm and dry. She is not diaphoretic.  Psychiatric: She has a normal mood and affect. Her behavior is normal. Judgment and thought content normal.  Nursing note and vitals reviewed.    MD Evaluation Airway: WNL Heart: WNL Abdomen: WNL Chest/ Lungs: WNL ASA  Classification: 3 Mallampati/Airway Score: Two   Imaging: Nm Pet Image Initial (pi) Skull Base To Thigh  Result Date: 08/29/2018 CLINICAL DATA:  Initial treatment strategy for left lung mass and multiple left pleural nodules. EXAM: NUCLEAR MEDICINE PET SKULL BASE TO THIGH TECHNIQUE: 5.0 mCi F-18 FDG was injected intravenously. Full-ring PET imaging was performed from the skull base to thigh after the radiotracer. CT data was obtained and used for attenuation correction and anatomic  localization. Fasting blood glucose: 103 mg/dl COMPARISON:  CT scan 07/29/2018 FINDINGS: Mediastinal blood pool activity: SUV max 2.44 NECK: No hypermetabolic lymph nodes in the neck. Incidental CT findings: CHEST: The 3 cm left upper lobe pulmonary mass is hypermetabolic with SUV max of 60.10. Associated left hilar lymphadenopathy is hypermetabolic with SUV max of 93.23. There are also several pleural nodules involving the left hemithorax. The largest measures 2.8 cm in the left upper lobe and has an SUV max of 30.68. Several other pleural nodules at the left lung apex are hypermetabolic. No other worrisome pulmonary nodules. No breast masses, supraclavicular or axillary lymphadenopathy. Incidental CT findings: Small left pleural effusion. ABDOMEN/PELVIS: No abnormal hypermetabolic activity within the liver, pancreas, adrenal glands, or spleen. No hypermetabolic lymph nodes in the abdomen or pelvis. Incidental CT findings: Calcified uterine fibroids are noted. SKELETON: There is a fracture of the left humeral neck.  Mild hypermetabolism is noted but could be related to healing. SUV max is 3.93. There is also mild hypermetabolism in the left ninth and tenth anterior ribs without obvious fracture. Could not exclude metastatic disease. SUV max is 3.18. Area of hypermetabolism involving the anterior cortex of the right femoral neck with SUV max of 4.0. No obvious CT correlate. Incidental CT findings: none IMPRESSION: 1. 3 cm left upper lobe pulmonary mass is hypermetabolic and likely primary lung neoplasm. Associated left hilar lymphadenopathy. 2. Numerous left-sided pleural lesions are likely metastatic disease. 3. Left humeral neck fracture, ninth and tenth anterior rib abnormalities and a possible cortical lesion involving the right femoral neck are suspicious for metastatic disease. Low level hypermetabolism. 4. No findings for metastatic disease involving the abdomen/pelvis. Electronically Signed   By: Marijo Sanes M.D.   On: 08/29/2018 15:14    Labs:  CBC: Recent Labs    07/29/18 1710 08/30/18 1328  WBC 9.2 9.1  HGB 13.0 12.7  HCT 39.3 37.9  PLT 249 272    COAGS: No results for input(s): INR, APTT in the last 8760 hours.  BMP: Recent Labs    07/29/18 1710 08/30/18 1328  NA 138 146*  K 3.9 4.5  CL 102 111  CO2 27 27  GLUCOSE 97 96  BUN 13 16  CALCIUM 8.7* 9.6  CREATININE 0.47 0.70  GFRNONAA >60 >60  GFRAA >60 >60    LIVER FUNCTION TESTS: Recent Labs    08/30/18 1328  BILITOT 0.3  AST 20  ALT 13  ALKPHOS 89  PROT 7.3  ALBUMIN 4.0    TUMOR MARKERS: No results for input(s): AFPTM, CEA, CA199, CHROMGRNA in the last 8760 hours.  Assessment and Plan: Patient with past medical history of subdural hemorrhage in 2004, speech delay, frequent falls at home presents with complaint of lung nodule/mass.  IR consulted for lung mass biopsy at the request of Dr. Julien Nordmann. Case reviewed by Dr. Barbie Banner who approves patient for procedure.  Patient presents today in their usual state of health.  She has been NPO and is not currently on blood thinners.    Risks and benefits discussed with the patient including, but not limited to bleeding, hemoptysis, respiratory failure requiring intubation, infection, pneumothorax requiring chest tube placement, stroke from air embolism or even death.  All of the patient's questions were answered, patient is agreeable to proceed. Consent signed and in chart.  Thank you for this interesting consult.  I greatly enjoyed meeting Claudia Perez and look forward to participating in their care.  A copy of this report was sent to the requesting provider on this date.  Electronically Signed: Docia Barrier, PA 09/07/2018, 10:29 AM   I spent a total of  30 Minutes   in face to face in clinical consultation, greater than 50% of which was counseling/coordinating care for lung nodule.

## 2018-09-08 ENCOUNTER — Encounter: Payer: Self-pay | Admitting: *Deleted

## 2018-09-08 NOTE — Progress Notes (Signed)
Oncology Nurse Navigator Documentation  Oncology Nurse Navigator Flowsheets 09/08/2018  Navigator Location [facility]  Navigator Encounter Type Other/per Dr. Oley Balm One and PDL 1 has been requested on recent pathology.   Treatment Phase Pre-Tx/Tx Discussion  Barriers/Navigation Needs Coordination of Care  Interventions Coordination of Care  Coordination of Care Other  Acuity Level 2  Acuity Level 2 Other  Time Spent with Patient 30

## 2018-09-09 ENCOUNTER — Ambulatory Visit (HOSPITAL_COMMUNITY)
Admission: RE | Admit: 2018-09-09 | Discharge: 2018-09-09 | Disposition: A | Discharge status: Home / Self Care | Payer: Medicare Other | Source: Ambulatory Visit | Attending: Internal Medicine | Admitting: Internal Medicine

## 2018-09-09 DIAGNOSIS — G9389 Other specified disorders of brain: Secondary | ICD-10-CM | POA: Diagnosis not present

## 2018-09-09 DIAGNOSIS — C3490 Malignant neoplasm of unspecified part of unspecified bronchus or lung: Secondary | ICD-10-CM

## 2018-09-09 MED ORDER — GADOBUTROL 1 MMOL/ML IV SOLN
7.5000 mL | Freq: Once | INTRAVENOUS | Status: AC | PRN
  Administered 2018-09-09: 4.5 mL via INTRAVENOUS

## 2018-09-17 DIAGNOSIS — C3491 Malignant neoplasm of unspecified part of right bronchus or lung: Secondary | ICD-10-CM | POA: Diagnosis not present

## 2018-09-19 ENCOUNTER — Telehealth: Payer: Self-pay | Admitting: Pharmacist

## 2018-09-19 ENCOUNTER — Telehealth: Payer: Self-pay

## 2018-09-19 ENCOUNTER — Encounter (HOSPITAL_COMMUNITY): Payer: Self-pay | Admitting: Internal Medicine

## 2018-09-19 ENCOUNTER — Other Ambulatory Visit: Payer: Self-pay

## 2018-09-19 ENCOUNTER — Telehealth: Payer: Self-pay | Admitting: Oncology

## 2018-09-19 ENCOUNTER — Inpatient Hospital Stay (HOSPITAL_BASED_OUTPATIENT_CLINIC_OR_DEPARTMENT_OTHER): Payer: Medicare Other | Admitting: Oncology

## 2018-09-19 ENCOUNTER — Encounter: Payer: Self-pay | Admitting: Oncology

## 2018-09-19 VITALS — BP 141/49 | HR 77 | Temp 98.0°F | Resp 16 | Ht 61.0 in

## 2018-09-19 DIAGNOSIS — C3412 Malignant neoplasm of upper lobe, left bronchus or lung: Secondary | ICD-10-CM | POA: Diagnosis not present

## 2018-09-19 DIAGNOSIS — Z5111 Encounter for antineoplastic chemotherapy: Secondary | ICD-10-CM | POA: Insufficient documentation

## 2018-09-19 DIAGNOSIS — Z7189 Other specified counseling: Secondary | ICD-10-CM

## 2018-09-19 DIAGNOSIS — C7951 Secondary malignant neoplasm of bone: Secondary | ICD-10-CM | POA: Diagnosis not present

## 2018-09-19 DIAGNOSIS — C3492 Malignant neoplasm of unspecified part of left bronchus or lung: Secondary | ICD-10-CM

## 2018-09-19 MED ORDER — OSIMERTINIB MESYLATE 80 MG PO TABS: 80.0000 mg | ORAL_TABLET | Freq: Every day | ORAL | 1 refills | Status: DC

## 2018-09-19 NOTE — Telephone Encounter (Signed)
Oral Oncology Patient Advocate Encounter  Prior Authorization for Claudia Perez has been approved.    PA# [id number] Effective dates: 06/21/18 through 09/18/21  Oral Oncology Clinic will continue to follow.   [place] Patient [place] Phone [phone] Fax [phone]

## 2018-09-19 NOTE — Telephone Encounter (Signed)
Scheduled appt per 10/21 los- gave patient AVS and calender per los.   

## 2018-09-19 NOTE — Telephone Encounter (Signed)
Oral Oncology Patient Advocate Encounter  Received notification from [employer] that prior authorization for Tagrisso is required.  PA submitted on CoverMyMeds Key [id number] Status is pending  Oral Oncology Clinic will continue to follow.  [place] Patient [place] Phone [phone] Fax [phone]

## 2018-09-19 NOTE — Telephone Encounter (Signed)
Oral Chemotherapy Pharmacist Encounter   I spoke with patient and husband in exam room for overview of: Tagrisso (osimertinib) for the treatment of metastatic non small cell lung cancer, EGFR mutation positive (exon 19 deletion), planned duration until disease progression or unacceptable toxicity.  Counseled patient on administration, dosing, side effects, monitoring, drug-food interactions, safe handling, storage, and disposal.  Patient will take Tagrisso 80 tablets, 1 tablet by mouth once daily, without regard to food.  Tagrisso start date: TBD, pending medication acquisition  Adverse effects include but are not limited to: diarrhea, mouth sores, decreased appetitie, fatigue, dry skin, rash, nail changes, altered cardiac conduction, and decreased blood counts or electrolytes.  Patient will obtain anti diarrheal and alert the office of 4 or more loose stools above baseline.   Reviewed with patient importance of keeping a medication schedule and plan for any missed doses.  Mr. and Mrs. Uram voiced understanding and appreciation.   All questions answered. Medication reconciliation performed and medication/allergy list updated.  Will follow up with patient regarding insurance and pharmacy once insurance authorization is obtained and copayment is known.  We extensively discussed medicare copayment and possibility for high copayment. We discussed options for copayment assistance including copayment grant foundations and manufacturer compassionate use program. Patient instructed that if they are enrolled into manufacturer compassionate use program that medication will be shipped directly to their home from dispensing pharmacy of program's choosing.   Patient knows to call the office with questions or concerns. Oral Oncology Clinic will continue to follow.  Johny Drilling, PharmD, BCPS, BCOP  09/19/2018   11:25 AM Oral Oncology Clinic [phone]

## 2018-09-19 NOTE — Progress Notes (Signed)
[facility] OFFICE PROGRESS NOTE  Leighton Ruff, MD [place] [address]  DIAGNOSIS: stage IV (T2 a, N2, M1c) non-small cell carcinoma, adenocarcinoma.  She presented with left upper lobe lung mass in addition to multiple pleural-based nodules as well as suspicious bone metastasis to the left humeral neck, left ninth and 10th ribs as well as right femoral neck.  Biomarker Findings Tumor Mutational Burden - TMB-Intermediate (16 Muts/Mb) Microsatellite status - MS-Stable Genomic Findings For a complete list of the genes assayed, please refer to the Appendix. EGFR exon 19 deletion ([id number]), amplification MDM2 amplification EPHA3 V206L RB1 loss exons 18-27 TP53 R273H, R280K 7 Disease relevant genes with no reportable alterations: KRAS, ALK, BRAF, MET, RET, ERBB2, ROS1  PDL 1 expression is 50%.  PRIOR THERAPY: None  CURRENT THERAPY: Tagrisso 80 mg daily to begin within the next few days.  INTERVAL HISTORY: Claudia Perez 67 y.o. female returns for a routine follow-up visit accompanied by her husband.  The patient reports that she is feeling fine and has no specific complaints except for ongoing pain to her neck.  She has been given a prescription for Percocet but has not yet used it.  She denies fevers and chills.  Denies chest pain, shortness of breath, cough, hemoptysis.  Denies nausea, vomiting, constipation, diarrhea.  Reports a good appetite.  She was unable to tan to be weighed today.  She had a recent MRI of the brain and biopsy and is here to discuss the results.  MEDICAL HISTORY: Past Medical History:  Diagnosis Date  . Abnormality of gait   . Fall at home 03/27/2013   "lost consciousness; fractured right clavicle" (03/27/2013)  . GERD (gastroesophageal reflux disease)   . Hypercholesteremia   . Hyperthyroidism    "a little bit; don't take RX for it" (03/27/2013)  . Mobility impaired   . Other closed skull fracture without mention  of intracranial injury, unspecified state of consciousness   . Seizures ([place])    "think I might have; been blacking out several times in the past" (03/27/2013)  . Shortness of breath    "even now, just talking" (03/27/2013)  . Subdural hemorrhage ([place])   . Vitamin D deficiency     ALLERGIES:  is allergic to nitrofurantoin; phenazopyridine; sulfamethoxazole-trimethoprim; and sulfamethoxazole.  MEDICATIONS:  Current Outpatient Medications  Medication Sig Dispense Refill  . acetaminophen (TYLENOL) 500 MG tablet Take 1,000 mg by mouth daily.     . Multiple Vitamin (MULTIVITAMIN) capsule Take 1 capsule by mouth daily.    . Vitamin D, Ergocalciferol, 2000 units CAPS Take 1 capsule by mouth daily.     Marland Kitchen osimertinib mesylate (TAGRISSO) 80 MG tablet Take 1 tablet (80 mg total) by mouth daily. 30 tablet 1  . oxyCODONE-acetaminophen (PERCOCET/ROXICET) 5-325 MG tablet Take 1 tablet by mouth every 8 (eight) hours as needed for severe pain. (Patient not taking: Reported on 09/19/2018) 30 tablet 0   No current facility-administered medications for this visit.     SURGICAL HISTORY:  Past Surgical History:  Procedure Laterality Date  . APPENDECTOMY  1960's  . BRAIN SURGERY  10/2003   SDH evac  . EYE SURGERY  02/2013   cataract extraction w/ intraocular lens implant    REVIEW OF SYSTEMS:   Review of Systems  Constitutional: Negative for appetite change, chills, fatigue, fever.  HENT:   Negative for mouth sores, nosebleeds, sore throat and trouble swallowing.   Eyes: Negative for eye problems and icterus.  Respiratory:  Negative for cough, hemoptysis, shortness of breath and wheezing.   Cardiovascular: Negative for chest pain and leg swelling.  Gastrointestinal: Negative for abdominal pain, constipation, diarrhea, nausea and vomiting.  Genitourinary: Negative for bladder incontinence, difficulty urinating, dysuria, frequency and hematuria.   Musculoskeletal: Positive for neck pain.  Positive for  falls. Skin: Negative for itching and rash.  Neurological: Denies seizures and headaches.  Positive for dizziness. Hematological: Negative for adenopathy. Does not bruise/bleed easily.  Psychiatric/Behavioral: Negative for confusion, depression and sleep disturbance. The patient is not nervous/anxious.     PHYSICAL EXAMINATION:  Blood pressure (!) 141/49, pulse 77, temperature 98 F (36.7 C), temperature source Oral, resp. rate 16, height _0  (1.549 m), SpO2 100 %. Weight was not obtained today since the patient could not stand to be weighed.  ECOG PERFORMANCE STATUS: 1 - Symptomatic but completely ambulatory  Physical Exam  Constitutional: Oriented to person, place, and time. No distress.  HENT:  Head: Normocephalic and atraumatic.  Mouth/Throat: Oropharynx is clear and moist. No oropharyngeal exudate.  Eyes: Conjunctivae are normal. Right eye exhibits no discharge. Left eye exhibits no discharge. No scleral icterus.  Neck: Normal range of motion. Neck supple.  Cardiovascular: Normal rate, regular rhythm, normal heart sounds and intact distal pulses.   Pulmonary/Chest: Effort normal and breath sounds normal. No respiratory distress. No wheezes. No rales.  Abdominal: Soft. Bowel sounds are normal. Exhibits no distension and no mass. There is no tenderness.  Musculoskeletal: Normal range of motion. Exhibits no edema.  Lymphadenopathy:    No cervical adenopathy.  Neurological: Alert and oriented to person, place, and time. Exhibits normal muscle tone. Coordination normal.  Skin: Skin is warm and dry. No rash noted. Not diaphoretic. No erythema. No pallor.  Psychiatric: Mood, memory and judgment normal.  Vitals reviewed.  LABORATORY DATA: Lab Results  Component Value Date   WBC 8.5 09/07/2018   HGB 13.4 09/07/2018   HCT 42.2 09/07/2018   MCV 101.0 (H) 09/07/2018   PLT 342 09/07/2018      Chemistry      Component Value Date/Time   NA 146 (H) 08/30/2018 1328   K 4.5  08/30/2018 1328   CL 111 08/30/2018 1328   CO2 27 08/30/2018 1328   BUN 16 08/30/2018 1328   CREATININE 0.70 08/30/2018 1328   CREATININE 0.73 06/08/2014 1215      Component Value Date/Time   CALCIUM 9.6 08/30/2018 1328   ALKPHOS 89 08/30/2018 1328   AST 20 08/30/2018 1328   ALT 13 08/30/2018 1328   BILITOT 0.3 08/30/2018 1328       RADIOGRAPHIC STUDIES:  Mr Jeri Cos HY Contrast  Result Date: 09/09/2018 CLINICAL DATA:  68 year old female with recently diagnosed non-small cell lung cancer. Staging. History of craniotomy for evacuation of subdural hematoma. EXAM: MRI HEAD WITHOUT AND WITH CONTRAST TECHNIQUE: Multiplanar, multiecho pulse sequences of the brain and surrounding structures were obtained without and with intravenous contrast. CONTRAST:  4.5 milliliters Gadavist COMPARISON:  PET-CT 08/29/2018. Head CT without contrast 07/29/2018. Brain MRI 03/08/2018. FINDINGS: Brain: Previous left parietal craniotomy. Chronic encephalomalacia in the left parietal lobe, both anterior and inferior frontal lobes, right anterior temporal lobe. Chronic blood products in the medial left parietal lobe. No abnormal enhancement identified. No midline shift, mass effect, or evidence of intracranial mass lesion. No dural thickening. No restricted diffusion to suggest acute infarction. No ventriculomegaly, extra-axial collection or acute intracranial hemorrhage. Cervicomedullary junction within normal limits. Chronic partially empty sella. Scattered nonspecific cerebral white matter  signal changes. No new signal abnormality compared to April. No other chronic cerebral blood products identified. Vascular: Major intracranial vascular flow voids are stable since 2019. The major dural venous sinuses are enhancing and appear patent. Skull and upper cervical spine: Negative visible cervical spine and spinal cord. Visualized bone marrow signal is within normal limits. Sinuses/Orbits: Stable and negative. Other: Visible  internal auditory structures appear normal. Stable and negative scalp and face soft tissues. IMPRESSION: 1.  No metastatic disease or acute intracranial abnormality. 2. Multifocal chronic encephalomalacia is stable and compatible with sequelae of prior trauma. Electronically Signed   By: Genevie Ann M.D.   On: 09/09/2018 14:53   Nm Pet Image Initial (pi) Skull Base To Thigh  Result Date: 08/29/2018 CLINICAL DATA:  Initial treatment strategy for left lung mass and multiple left pleural nodules. EXAM: NUCLEAR MEDICINE PET SKULL BASE TO THIGH TECHNIQUE: 5.0 mCi F-18 FDG was injected intravenously. Full-ring PET imaging was performed from the skull base to thigh after the radiotracer. CT data was obtained and used for attenuation correction and anatomic localization. Fasting blood glucose: 103 mg/dl COMPARISON:  CT scan 07/29/2018 FINDINGS: Mediastinal blood pool activity: SUV max 2.44 NECK: No hypermetabolic lymph nodes in the neck. Incidental CT findings: CHEST: The 3 cm left upper lobe pulmonary mass is hypermetabolic with SUV max of 78.24. Associated left hilar lymphadenopathy is hypermetabolic with SUV max of 23.53. There are also several pleural nodules involving the left hemithorax. The largest measures 2.8 cm in the left upper lobe and has an SUV max of 30.68. Several other pleural nodules at the left lung apex are hypermetabolic. No other worrisome pulmonary nodules. No breast masses, supraclavicular or axillary lymphadenopathy. Incidental CT findings: Small left pleural effusion. ABDOMEN/PELVIS: No abnormal hypermetabolic activity within the liver, pancreas, adrenal glands, or spleen. No hypermetabolic lymph nodes in the abdomen or pelvis. Incidental CT findings: Calcified uterine fibroids are noted. SKELETON: There is a fracture of the left humeral neck. Mild hypermetabolism is noted but could be related to healing. SUV max is 3.93. There is also mild hypermetabolism in the left ninth and tenth anterior ribs  without obvious fracture. Could not exclude metastatic disease. SUV max is 3.18. Area of hypermetabolism involving the anterior cortex of the right femoral neck with SUV max of 4.0. No obvious CT correlate. Incidental CT findings: none IMPRESSION: 1. 3 cm left upper lobe pulmonary mass is hypermetabolic and likely primary lung neoplasm. Associated left hilar lymphadenopathy. 2. Numerous left-sided pleural lesions are likely metastatic disease. 3. Left humeral neck fracture, ninth and tenth anterior rib abnormalities and a possible cortical lesion involving the right femoral neck are suspicious for metastatic disease. Low level hypermetabolism. 4. No findings for metastatic disease involving the abdomen/pelvis. Electronically Signed   By: Marijo Sanes M.D.   On: 08/29/2018 15:14   Ct Biopsy  Result Date: 09/07/2018 INDICATION: Left lung mass.  Left apical pleural masses. EXAM: CT BIOPSY MEDICATIONS: None. ANESTHESIA/SEDATION: Fentanyl 25 mcg IV; Versed 1 mg IV Moderate Sedation Time:  10 minutes The patient was continuously monitored during the procedure by the interventional radiology nurse under my direct supervision. FLUOROSCOPY TIME:  Fluoroscopy Time:  minutes  seconds ( mGy). COMPLICATIONS: None immediate. PROCEDURE: Informed written consent was obtained from the patient after a thorough discussion of the procedural risks, benefits and alternatives. All questions were addressed. Maximal Sterile Barrier Technique was utilized including caps, mask, sterile gowns, sterile gloves, sterile drape, hand hygiene and skin antiseptic. A timeout was performed  prior to the initiation of the procedure. Under CT guidance, a(n) 17 gauge guide needle was advanced into the left apical pleural mass via posterior approach in the prone position. Subsequently 3 18 gauge core biopsies were obtained. The guide needle was removed. Post biopsy images demonstrate no pneumothorax. Patient tolerated the procedure well without  complication. Vital sign monitoring by nursing staff during the procedure will continue as patient is in the special procedures unit for post procedure observation. FINDINGS: Images document needle placement in the left apical pleural mass. Post biopsy images demonstrate no evidence of pneumothorax or hemorrhage. IMPRESSION: Successful core biopsy of a pleural mass at the left apex. Electronically Signed   By: Marybelle Killings M.D.   On: 09/07/2018 14:51   Dg Chest Port 1 View  Result Date: 09/07/2018 CLINICAL DATA:  Status post biopsy. EXAM: PORTABLE CHEST 1 VIEW COMPARISON:  Chest CT, 07/29/2018. FINDINGS: No pneumothorax following lung biopsy. Mass projects below the left hilum, reflecting the left upper lobe mass noted on the prior CT. There are pleural based masses in the left upper hemithorax, largest along the lateral aspect of the upper mid thorax adjacent to the lateral third and fourth ribs, larger than it was on the prior CT. Mild reticular/linear opacities noted at the lung bases, greater on the left, consistent with subsegmental atelectasis. Remainder of the lungs is clear. No convincing pleural effusion. Cardiac silhouette is normal in size.  Normal mediastinal contours. Fracture of the proximal left humerus. This is not acute and is without change from the prior CT. Skeletal structures are diffusely demineralized. IMPRESSION: 1. No pneumothorax or evidence of a complication following lung biopsy. 2. No acute cardiopulmonary disease. Electronically Signed   By: Lajean Manes M.D.   On: 09/07/2018 13:27     ASSESSMENT/PLAN:  Primary adenocarcinoma of left lung [facility]) This is a very pleasant 67 year old African female originally from [place], and never smoker presented with stage IV (T2 a, N2, M1c)  small cell lung cancer, adenocarcinoma. She presented with left upper lobe lung mass in addition to multiple pleural-based nodules as well as suspicious bone metastasis to the left humeral neck, left  ninth and 10th ribs as well as right femoral neck.  She had a recent MRI of the brain and biopsy and is here to discuss the results and treatment options.  The patient was seen with Dr. Julien Nordmann.  MRI of the brain results were discussed with the patient which showed no evidence of brain metastasis.  Biopsy result was discussed with the patient which was consistent with adenocarcinoma.  [facility] 1 testing showed EGFR mutation(exon 19 deletion).  PDL 1 testing was 50%. Recommend for the patient to begin treatment with targeted therapy consisting of Tagrisso 80 mg daily.  Adverse effects of this medication were discussed with the patient including but not limited to rash, diarrhea, altered cardiac conduction, fatigue.  The patient is interested in pursuing treatment with Tagrisso.  A baseline EKG was obtained today which is adequate to proceed with treatment.  The patient met with our oral chemotherapy pharmacist today.  A prescription for Tagrisso was sent to [name].  The patient will begin this medication as soon as it is available to her.  She will follow-up in 2 to 3 weeks for evaluation and repeat lab work.  For pain management, she was reminded to use the Percocet prescription that was previously given to her.  She was advised to call immediately if she has any concerning  symptoms in the interval. The patient voices understanding of current disease status and treatment options and is in agreement with the current care plan.  All questions were answered. The patient knows to call the clinic with any problems, questions or concerns. We can certainly see the patient much sooner if necessary.   Orders Placed This Encounter  Procedures  . CBC with Differential (Cancer Center Only)    Standing Status:   Future    Standing Expiration Date:   09/20/2019  . CMP ([facility] only)    Standing Status:   Future    Standing Expiration Date:   09/20/2019  . Magnesium    Standing  Status:   Future    Standing Expiration Date:   09/20/2019  . EKG 12-Lead  . EKG 12-Lead    Ordered by an unspecified provider   . EKG 12-Lead    Ordered by an unspecified provider      Claudia Bussing, DNP, AGPCNP-BC, AOCNP 09/19/18   ADDENDUM: Hematology/Oncology Attending: I had a face-to-face encounter with the patient today.  I recommended her care plan.  This is a very pleasant 67 years old African female recently diagnosed with metastatic non-small cell lung cancer, adenocarcinoma.  The patient had molecular studies performed by foundation 1 that showed positive EGFR mutation with deletion in exon 19.  She also has PDL 1 expression of 50%. I had a lengthy discussion with the patient today about her current disease status and treatment options.  I strongly recommend for the patient to consider treatment with EGFR tyrosine kinase inhibitor with Tagrisso 80 mg p.o. daily.  We discussed with the patient the adverse effect of this treatment including but not limited to skin rash, diarrhea, interstitial lung disease as well as cardiac dysfunction.  The patient had an EKG today that was unremarkable. She will see the pharmacist for oral oncolytic today for evaluation and education about her treatment and also for help getting her medication. We will see the patient back for follow-up visit in 2-3 weeks for evaluation and repeat blood work. The patient was advised to call immediately if she has any concerning symptoms in the interval.  Disclaimer: This note was dictated with voice recognition software. Similar sounding words can inadvertently be transcribed and may be missed upon review. Eilleen Kempf, MD 09/19/18

## 2018-09-19 NOTE — Telephone Encounter (Signed)
Oral Oncology Pharmacist Encounter  Received new prescription for Tagrisso (osimertinib) for the treatment of metastatic non small cell lung cancer, EGFR mutation positive (exon 19 deletion), planned duration until disease progression or unacceptable toxicity.  Labs from 08/30/18 assessed, [facility] for treatment. EKG from today shows QTc interval at 415 msec, safe for Tagrisso initiation  Current medication list in Epic reviewed, no DDIs with Tagrisso identified.  Prescription has been e-scribed to the [facility] for benefits analysis and approval.  Oral Oncology Clinic will continue to follow for insurance authorization, copayment issues, initial counseling and start date.  Johny Drilling, PharmD, BCPS, BCOP  09/19/2018 11:25 AM Oral Oncology Clinic [phone]

## 2018-09-19 NOTE — Telephone Encounter (Signed)
Oral Oncology Patient Advocate Encounter  [place] is $2,[phone]. There is no grant funding available at the moment. I filled out an AZ&ME application.  I called the patient and left her a message to call me back. I need her to come in and sign the forms.   I put the doctors portion of the application to sign and give refills if necessary in the doctors folder.   Will continue to update  [facility] [facility] [place] Phone [phone] Fax [phone]

## 2018-09-19 NOTE — Assessment & Plan Note (Signed)
This is a very pleasant 67 year old African female originally from [place], and never smoker presented with stage IV (T2 a, N2, M1c)  small cell lung cancer, adenocarcinoma. She presented with left upper lobe lung mass in addition to multiple pleural-based nodules as well as suspicious bone metastasis to the left humeral neck, left ninth and 10th ribs as well as right femoral neck.  She had a recent MRI of the brain and biopsy and is here to discuss the results and treatment options.  The patient was seen with Dr. Julien Nordmann.  MRI of the brain results were discussed with the patient which showed no evidence of brain metastasis.  Biopsy result was discussed with the patient which was consistent with adenocarcinoma.  Foundation 1 testing showed EGFR mutation(exon 19 deletion).  PDL 1 testing was 50%. Recommend for the patient to begin treatment with targeted therapy consisting of Tagrisso 80 mg daily.  Adverse effects of this medication were discussed with the patient including but not limited to rash, diarrhea, altered cardiac conduction, fatigue.  The patient is interested in pursuing treatment with Tagrisso.  A baseline EKG was obtained today which is adequate to proceed with treatment.  The patient met with our oral chemotherapy pharmacist today.  A prescription for Tagrisso was sent to [name].  The patient will begin this medication as soon as it is available to her.  She will follow-up in 2 to 3 weeks for evaluation and repeat lab work.  For pain management, she was reminded to use the Percocet prescription that was previously given to her.  She was advised to call immediately if she has any concerning symptoms in the interval. The patient voices understanding of current disease status and treatment options and is in agreement with the current care plan.  All questions were answered. The patient knows to call the clinic with any problems, questions or concerns. We can certainly see the  patient much sooner if necessary.

## 2018-09-19 NOTE — Patient Instructions (Signed)
Osimertinib oral tablets What is this medicine? Osimertinib (OH sim ER ti nib) is a medicine that targets proteins in cancer cells and stops the cancer cells from growing. It is used to treat non-small cell lung cancer. This medicine may be used for other purposes; ask your health care provider or pharmacist if you have questions. COMMON BRAND NAME(S): Tagrisso What should I tell my health care provider before I take this medicine? They need to know if you have any of these conditions: -heart disease -history of irregular heartbeat -history of low levels of calcium, magnesium, or potassium in the blood -lung or breathing disease, like asthma -QT prolongation -scarring or thickening of the lungs -an unusual or allergic reaction to osimertinib, other medicines, foods, dyes, or preservatives -pregnant or trying to get pregnant -breast-feeding How should I use this medicine? Take osimertinib tablets by mouth with a glass of water. You can take it with or without food. Follow the directions on the prescription label. Take your medicine at regular intervals. Do not take it more often than directed. Do not stop taking except on your doctor's advice. If swallowing is difficult, you can take the tablet by placing your dose in a container with 2 ounces of cool water only. Stir until the tablet is in small pieces. The tablet will not completely dissolve. Do not crush or chew. Drink the water and the tablet pieces right away. Then add 4 to 8 ounces of water to the same container and drink to make sure you take your full dose. Talk to your pediatrician regarding the use of this medicine in children. Special care may be needed. Overdosage: If you think you have taken too much of this medicine contact a poison control center or emergency room at once. NOTE: This medicine is only for you. Do not share this medicine with others. What if I miss a dose? If you miss a dose, do not make up for the missed dose. Take  your next dose at your regular time. Do not take extra or double doses. What may interact with this medicine? Do not take this medicine with any of the following medications: -cisapride -dofetilide -dronedarone -penicillamine -pimozide -thioridazine -ziprasidone This medicine may interact with the following medications: -certain medicines for seizures like carbamazepine, phenobarbital, phenytoin -other medicines that prolong the QT interval (cause an abnormal heart rhythm) -rifampin -rosuvastatin -St. John's Wort -sulfasalazine -topotecan This list may not describe all possible interactions. Give your health care provider a list of all the medicines, herbs, non-prescription drugs, or dietary supplements you use. Also tell them if you smoke, drink alcohol, or use illegal drugs. Some items may interact with your medicine. What should I watch for while using this medicine? Visit your doctor for regular check ups. Report any side effects. Continue your course of treatment unless your doctor tells you to stop. You will need blood work done while you are taking this medicine. Do not become pregnant while taking this medicine or for 6 weeks after the last dose. Males with female partners of reproductive potential should use effective contraception for 4 months after the last dose. Women should inform their doctor if they wish to become pregnant or think they might be pregnant. There is a potential for serious side effects to an unborn child. This medicine may interfere with the ability to have a child for both men and women. You should talk with your doctor or health care professional if you are concerned about your fertility. Talk to your  health care professional or pharmacist for more information. Do not breast-feed an infant while taking this medicine or for 2 weeks after the last dose. This drug may make you feel generally unwell. This is not uncommon, as chemotherapy can affect healthy cells as  well as cancer cells. Report any side effects. Continue your course of treatment even though you feel ill unless your doctor tells you to stop. What side effects may I notice from receiving this medicine? Side effects that you should report to your doctor or health care professional as soon as possible: -allergic reactions like skin rash, itching or hives, swelling of the face, lips, or tongue -red spots on the skin -signs and symptoms of a dangerous change in heartbeat or heart rhythm like chest pain; dizziness; fast or irregular heartbeat; palpitations; feeling faint or lightheaded, falls; breathing problems -signs and symptoms of increased potassium like muscle weakness; chest pain; or fast, irregular heartbeat -signs and symptoms of low potassium like muscle cramps or muscle pain; chest pain; dizziness; feeling faint or lightheaded, falls; palpitations; breathing problems; or fast, irregular heartbeat -swelling of the legs or ankles -unusually weak or tired Side effects that usually do not require medical attention (report to your doctor or health care professional if they continue or are bothersome): -diarrhea -dry skin -nail changes This list may not describe all possible side effects. Call your doctor for medical advice about side effects. You may report side effects to FDA at 1-800-FDA-1088. Where should I keep my medicine? Keep out of the reach of children. Store at room temperature between 20 and 25 degrees C (68 and 77 degrees F). Throw away any unused medicine after the expiration date. NOTE: This sheet is a summary. It may not cover all possible information. If you have questions about this medicine, talk to your doctor, pharmacist, or health care provider.  2018 Elsevier/Gold Standard (2016-01-01 13:04:13)

## 2018-09-20 ENCOUNTER — Encounter (HOSPITAL_COMMUNITY): Payer: Self-pay | Admitting: Internal Medicine

## 2018-09-20 NOTE — Telephone Encounter (Signed)
Oral Oncology Pharmacist Encounter  I called and spoke with patient to inform her about high copayment for Tagrisso.  Patient informed that we will need patient signature as well as income documentation in order to submit application to [employer] patient assistance program.  She will contact her husband and have her husband come up to the office with requested information.  Once application is complete we will fax application to [employer] prescription savings program to [phone]  This encounter will continue to be updated until final determination.  Johny Drilling, PharmD, BCPS, BCOP 09/20/2018 10:40 AM Oral Oncology Clinic [phone]

## 2018-09-20 NOTE — Telephone Encounter (Signed)
Oral Oncology Pharmacist Encounter  I met patient and husband in see [facility] lobby to complete application for enrollment to AZ&me prescription savings program.  Completed application has been faxed to [phone].  This encounter will continue to be updated until final determination.  Johny Drilling, PharmD, BCPS, BCOP  09/20/2018 12:45 PM Oral Oncology Clinic [phone]

## 2018-09-21 ENCOUNTER — Ambulatory Visit (HOSPITAL_BASED_OUTPATIENT_CLINIC_OR_DEPARTMENT_OTHER): Payer: PRIVATE HEALTH INSURANCE | Attending: Internal Medicine

## 2018-09-26 NOTE — Telephone Encounter (Signed)
Oral Oncology Patient Advocate Encounter  I called [employer] to follow up on this application. [employer] states that it is still in process and will take another week. I will continue to follow up on this.  This encounter will be updated until final determination.  [place] Patient [place] Phone [phone] Fax [phone]

## 2018-09-30 NOTE — Telephone Encounter (Signed)
Oral Oncology Pharmacist Encounter  Spoke with [employer]me prescription savings program today to follow-up on patient's application to receive Tagrisso through their compassionate use program. Representative stated that the application was still in process and possibly could take 3 to 4 weeks before it is be it is completed. I question where it was in the application process, she was unable to provide me with an answer. I question if there was a better phone number to follow-up on the status of her application, and she again was unable to provide me with that answer.  We will continue to call AZ&me prescription savings program to follow-up on patient's application in an effort to get her started on Tagrisso as soon as possible.  I have called and left a voicemail with the patient to update her on the application status.  Johny Drilling, PharmD, BCPS, BCOP  09/30/2018 10:32 AM Oral Oncology Clinic [phone]

## 2018-10-03 ENCOUNTER — Telehealth: Payer: Self-pay

## 2018-10-03 NOTE — Telephone Encounter (Signed)
Oral Oncology Patient Advocate Encounter  I was able to secure a Patient [place] [facility]) to cover the $2229.12 copay. The grant information is as follows and has been shared with [place].  ID: [id number] Group: [id number] BIN: [id number] PCN: [id number] Award amount [id number] Approval dates: 07/05/2018-10/03/19  I called and left a message for the patient to return my call. Everything is good for her to schedule her pick up/delivery with [place].  [place] Patient [place] Phone [phone] Fax [phone]

## 2018-10-04 MED FILL — TAGRISSO 80 MG TABLET: 80 | 30 days supply | Qty: 30 | Fill #0

## 2018-10-04 NOTE — Telephone Encounter (Signed)
Oral Oncology Pharmacist Encounter  Attempted to reach patient on both phone numbers provided in chart, both numbers currently disconnected.  I was able to get into contact with patient using spouse's cell phone number.  Informed patient about copayment grant to help cover out-of-pocket expenses for Tagrisso at the pharmacy.  They will pick up the first fill of Tagrisso from the [facility] today (10/04/2018) after 2 PM. Copayment of greater than $2200 will be fully covered through copayment grant secured from [facility].  Discussed with patient that we will still need to push forward with manufacturer assistance application as copayment grant will be depleted very quickly.  We briefly reviewed initial counseling session. Patient will start her Tagrisso today, 10/04/2018.  Patient expressed understanding and appreciation. She knows to call the office with any additional questions or concerns.  Johny Drilling, PharmD, BCPS, BCOP  10/04/2018 11:47 AM Oral Oncology Clinic [phone]

## 2018-10-05 ENCOUNTER — Telehealth: Payer: Self-pay | Admitting: Internal Medicine

## 2018-10-05 NOTE — Telephone Encounter (Signed)
Oral Oncology Patient Advocate Encounter  I followed up with [employer]ME today 59/9/35 and the application is still in processing.  [place] Patient [place] Phone [phone] Fax [phone]

## 2018-10-05 NOTE — Telephone Encounter (Signed)
Oral Oncology Patient Advocate Encounter  Confirmed with [place] that Newman Nip was picked up on 10/04/2018 with a $0 copay. PANF grant was used.   [place] Patient [place] Phone [phone] Fax [phone]

## 2018-10-05 NOTE — Telephone Encounter (Signed)
Called patient and left message for patient to call back to r/s appt per 11/6 sch message

## 2018-10-06 ENCOUNTER — Ambulatory Visit: Payer: PRIVATE HEALTH INSURANCE | Admitting: Internal Medicine

## 2018-10-06 ENCOUNTER — Inpatient Hospital Stay: Payer: Medicare Other | Attending: Internal Medicine

## 2018-10-06 DIAGNOSIS — C7951 Secondary malignant neoplasm of bone: Secondary | ICD-10-CM | POA: Insufficient documentation

## 2018-10-06 DIAGNOSIS — C3412 Malignant neoplasm of upper lobe, left bronchus or lung: Secondary | ICD-10-CM | POA: Insufficient documentation

## 2018-10-18 ENCOUNTER — Encounter: Payer: Self-pay | Admitting: Internal Medicine

## 2018-10-18 ENCOUNTER — Telehealth: Payer: Self-pay | Admitting: Internal Medicine

## 2018-10-18 ENCOUNTER — Inpatient Hospital Stay (HOSPITAL_BASED_OUTPATIENT_CLINIC_OR_DEPARTMENT_OTHER): Payer: Medicare Other | Admitting: Internal Medicine

## 2018-10-18 ENCOUNTER — Inpatient Hospital Stay: Payer: Medicare Other

## 2018-10-18 VITALS — BP 123/91 | HR 69 | Temp 98.3°F | Resp 17 | Ht 61.0 in | Wt 102.6 lb

## 2018-10-18 DIAGNOSIS — R5383 Other fatigue: Secondary | ICD-10-CM

## 2018-10-18 DIAGNOSIS — C3492 Malignant neoplasm of unspecified part of left bronchus or lung: Secondary | ICD-10-CM

## 2018-10-18 DIAGNOSIS — C3412 Malignant neoplasm of upper lobe, left bronchus or lung: Secondary | ICD-10-CM

## 2018-10-18 DIAGNOSIS — Z5111 Encounter for antineoplastic chemotherapy: Secondary | ICD-10-CM

## 2018-10-18 DIAGNOSIS — C7951 Secondary malignant neoplasm of bone: Secondary | ICD-10-CM

## 2018-10-18 LAB — CBC WITH DIFFERENTIAL (CANCER CENTER ONLY)
ABS IMMATURE GRANULOCYTES: 0.01 10*3/uL (ref 0.00–0.07)
BASOS ABS: 0 10*3/uL (ref 0.0–0.1)
Basophils Relative: 1 %
Eosinophils Absolute: 0.1 10*3/uL (ref 0.0–0.5)
Eosinophils Relative: 2 %
HCT: 40 % (ref 36.0–46.0)
HEMOGLOBIN: 12.7 g/dL (ref 12.0–15.0)
Immature Granulocytes: 0 %
Lymphocytes Relative: 21 %
Lymphs Abs: 1.4 10*3/uL (ref 0.7–4.0)
MCH: 32.5 pg (ref 26.0–34.0)
MCHC: 31.8 g/dL (ref 30.0–36.0)
MCV: 102.3 fL — ABNORMAL HIGH (ref 80.0–100.0)
Monocytes Absolute: 0.6 10*3/uL (ref 0.1–1.0)
Monocytes Relative: 10 %
NEUTROS ABS: 4.5 10*3/uL (ref 1.7–7.7)
NEUTROS PCT: 66 %
NRBC: 0 % (ref 0.0–0.2)
Platelet Count: 208 10*3/uL (ref 150–400)
RBC: 3.91 MIL/uL (ref 3.87–5.11)
RDW: 12.7 % (ref 11.5–15.5)
WBC: 6.6 10*3/uL (ref 4.0–10.5)

## 2018-10-18 LAB — CMP (CANCER CENTER ONLY)
ALBUMIN: 3.7 g/dL (ref 3.5–5.0)
ALK PHOS: 81 U/L (ref 38–126)
ALT: 12 U/L (ref 0–44)
ANION GAP: 10 (ref 5–15)
AST: 18 U/L (ref 15–41)
BUN: 14 mg/dL (ref 8–23)
CALCIUM: 9.1 mg/dL (ref 8.9–10.3)
CHLORIDE: 107 mmol/L (ref 98–111)
CO2: 23 mmol/L (ref 22–32)
CREATININE: 0.67 mg/dL (ref 0.44–1.00)
GFR, Est AFR Am: >60 mL/min (ref >60)
GFR, Estimated: >60 mL/min (ref >60)
GLUCOSE: 82 mg/dL (ref 70–99)
Potassium: 4.5 mmol/L (ref 3.5–5.1)
Sodium: 140 mmol/L (ref 135–145)
Total Bilirubin: 0.2 mg/dL — ABNORMAL LOW (ref 0.3–1.2)
Total Protein: 7.1 g/dL (ref 6.5–8.1)

## 2018-10-18 LAB — MAGNESIUM: Magnesium: 2 mg/dL (ref 1.7–2.4)

## 2018-10-18 NOTE — Telephone Encounter (Signed)
Printed calendar and avs. °

## 2018-10-18 NOTE — Progress Notes (Signed)
[facility] Telephone:([phone]   Fax:([phone]) [place], [place] [address]  DIAGNOSIS: Stage IV (T2 a, N2, M1c)non-small cell carcinoma, adenocarcinoma, presented with left upper lobe lung mass in addition to multiple pleural-based nodules as well as suspicious bone metastasis to the left humeral neck, left ninth and 10th ribs as well as right femoral neck.  Biomarker Findings Tumor Mutational Burden - TMB-Intermediate (16 Muts/Mb) Microsatellite status - MS-Stable Genomic Findings For a complete list of the genes assayed, please refer to the Appendix. EGFR exon 19 deletion ([id number]), amplification MDM2 amplification EPHA3 V206L RB1 loss exons 18-27 TP53 R273H, R280K 7 Disease relevant genes with no reportable alterations: KRAS, ALK, BRAF, MET, RET, ERBB2, ROS1  PDL 1 expression is 50%.  PRIOR THERAPY: None  CURRENT THERAPY: Tagrisso 80 mg p.o. daily first dose October 05, 2018.  INTERVAL HISTORY: Claudia Perez 67 y.o. female returns to the clinic today for follow-up visit.  The patient is feeling fine today with no specific complaints except for the generalized fatigue after her stroke.  She denied having any current chest pain, shortness breath, cough or hemoptysis.  She denied having any fever or chills.  She has no nausea, vomiting, diarrhea or constipation.  She continues to tolerate her treatment with Tagrisso fairly well.  She is here today for evaluation with repeat blood work.  MEDICAL HISTORY: Past Medical History:  Diagnosis Date  . Abnormality of gait   . Fall at home 03/27/2013   "lost consciousness; fractured right clavicle" (03/27/2013)  . GERD (gastroesophageal reflux disease)   . Hypercholesteremia   . Hyperthyroidism    "a little bit; don't take RX for it" (03/27/2013)  . Mobility impaired   . Other closed skull fracture without mention of intracranial injury,  unspecified state of consciousness   . Seizures ([place])    "think I might have; been blacking out several times in the past" (03/27/2013)  . Shortness of breath    "even now, just talking" (03/27/2013)  . Subdural hemorrhage ([place])   . Vitamin D deficiency     ALLERGIES:  is allergic to nitrofurantoin; phenazopyridine; sulfamethoxazole-trimethoprim; and sulfamethoxazole.  MEDICATIONS:  Current Outpatient Medications  Medication Sig Dispense Refill  . acetaminophen (TYLENOL) 500 MG tablet Take 1,000 mg by mouth daily.     . Multiple Vitamin (MULTIVITAMIN) capsule Take 1 capsule by mouth daily.    [place] osimertinib mesylate (TAGRISSO) 80 MG tablet Take 1 tablet (80 mg total) by mouth daily. 30 tablet 1  . oxyCODONE-acetaminophen (PERCOCET/ROXICET) 5-325 MG tablet Take 1 tablet by mouth every 8 (eight) hours as needed for severe pain. (Patient not taking: Reported on 09/19/2018) 30 tablet 0  . Vitamin D, Ergocalciferol, 2000 units CAPS Take 1 capsule by mouth daily.      No current facility-administered medications for this visit.     SURGICAL HISTORY:  Past Surgical History:  Procedure Laterality Date  . APPENDECTOMY  1960's  . BRAIN SURGERY  10/2003   SDH evac  . EYE SURGERY  02/2013   cataract extraction w/ intraocular lens implant    REVIEW OF SYSTEMS:  A comprehensive review of systems was negative except for: Constitutional: positive for fatigue Musculoskeletal: positive for muscle weakness   PHYSICAL EXAMINATION: General appearance: alert, cooperative, fatigued and no distress Head: Normocephalic, without obvious abnormality, atraumatic Neck: no adenopathy, no JVD, supple, symmetrical, trachea midline and thyroid not enlarged, symmetric, no tenderness/mass/nodules  Lymph nodes: Cervical, supraclavicular, and axillary nodes normal. Resp: clear to auscultation bilaterally Back: symmetric, no curvature. ROM normal. No CVA tenderness. Cardio: regular rate and rhythm, S1, S2 normal, no  murmur, click, rub or gallop GI: soft, non-tender; bowel sounds normal; no masses,  no organomegaly Extremities: extremities normal, atraumatic, no cyanosis or edema  ECOG PERFORMANCE STATUS: 1 - Symptomatic but completely ambulatory  Blood pressure (!) 123/91, pulse 69, temperature 98.3 F (36.8 C), temperature source Oral, resp. rate 17, height 5' 1"  (1.549 m), weight 102 lb 9.6 oz (46.5 kg), SpO2 100 %.  LABORATORY DATA: Lab Results  Component Value Date   WBC 6.6 10/18/2018   HGB 12.7 10/18/2018   HCT 40.0 10/18/2018   MCV 102.3 (H) 10/18/2018   PLT 208 10/18/2018      Chemistry      Component Value Date/Time   NA 146 (H) 08/30/2018 1328   K 4.5 08/30/2018 1328   CL 111 08/30/2018 1328   CO2 27 08/30/2018 1328   BUN 16 08/30/2018 1328   CREATININE 0.70 08/30/2018 1328   CREATININE 0.73 06/08/2014 1215      Component Value Date/Time   CALCIUM 9.6 08/30/2018 1328   ALKPHOS 89 08/30/2018 1328   AST 20 08/30/2018 1328   ALT 13 08/30/2018 1328   BILITOT 0.3 08/30/2018 1328       RADIOGRAPHIC STUDIES: No results found.  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  The patient was a started on treatment with Tagrisso 80 mg p.o. daily status post 2 weeks.  She has been tolerating this treatment well with no concerning adverse effects. Repeat CBC today is unremarkable.  Comprehensive metabolic panel is a still pending. I recommended for the patient to continue her current treatment with Tagrisso with the same dose. I will see the patient back for follow-up visit in 2 weeks for evaluation with repeat blood work. She was advised to call immediately if she has any concerning symptoms in the interval. The patient voices understanding of current disease status and treatment options and is in agreement with the current  care plan.  All questions were answered. The patient knows to call the clinic with any problems, questions or concerns. We can certainly see the patient much sooner if necessary.  I spent 10 minutes counseling the patient face to face. The total time spent in the appointment was 15 minutes.  Disclaimer: This note was dictated with voice recognition software. Similar sounding words can inadvertently be transcribed and may not be corrected upon review.

## 2018-10-19 NOTE — Telephone Encounter (Signed)
Oral Oncology Patient Advocate Encounter  I followed up with [employer]&ME yesterday 24/26/83 and the application is still in processing.  [place] Patient [place] Phone [phone] Fax [phone]

## 2018-10-19 NOTE — Telephone Encounter (Signed)
Oral Oncology Patient Advocate Encounter  [employer] called stating they could not read the insurance card. I gave them the information they needed over the phone and they are working on completing her application process at this time.  This encounter will be updated until final determination  [place] Patient [place] Phone [phone] Fax [phone]

## 2018-10-21 DIAGNOSIS — M25552 Pain in left hip: Secondary | ICD-10-CM | POA: Diagnosis not present

## 2018-10-21 DIAGNOSIS — R296 Repeated falls: Secondary | ICD-10-CM | POA: Diagnosis not present

## 2018-10-24 NOTE — Telephone Encounter (Signed)
Oral Oncology Patient Advocate Encounter  I received notification that Claudia Perez has been approved thru 11/29/18 and will be auto re-enrolled on 11/30/17 and that will expire 11/30/19. She will receive free Tagrisso shipped to her home each month. She should expect a call from [employer] soon to schedule her first shipment of [name].  I called the patient and gave her the good news, she verbalized understanding and appreciation.  [name] Patient [name] Phone [phone] Fax [phone]

## 2018-10-31 DIAGNOSIS — R4189 Other symptoms and signs involving cognitive functions and awareness: Secondary | ICD-10-CM | POA: Diagnosis not present

## 2018-10-31 DIAGNOSIS — G255 Other chorea: Secondary | ICD-10-CM | POA: Diagnosis not present

## 2018-10-31 DIAGNOSIS — R41844 Frontal lobe and executive function deficit: Secondary | ICD-10-CM | POA: Diagnosis not present

## 2018-10-31 MED FILL — TAGRISSO 80 MG TABLET: 80 | 30 days supply | Qty: 30 | Fill #1

## 2018-11-01 ENCOUNTER — Telehealth: Payer: Self-pay | Admitting: Internal Medicine

## 2018-11-01 ENCOUNTER — Encounter: Payer: Self-pay | Admitting: Oncology

## 2018-11-01 ENCOUNTER — Inpatient Hospital Stay (HOSPITAL_BASED_OUTPATIENT_CLINIC_OR_DEPARTMENT_OTHER): Payer: Medicare Other | Admitting: Oncology

## 2018-11-01 ENCOUNTER — Inpatient Hospital Stay: Payer: Medicare Other | Attending: Internal Medicine

## 2018-11-01 VITALS — BP 90/78 | HR 68 | Temp 98.5°F | Resp 14 | Ht 61.0 in | Wt 98.3 lb

## 2018-11-01 DIAGNOSIS — R269 Unspecified abnormalities of gait and mobility: Secondary | ICD-10-CM

## 2018-11-01 DIAGNOSIS — C3412 Malignant neoplasm of upper lobe, left bronchus or lung: Secondary | ICD-10-CM

## 2018-11-01 DIAGNOSIS — C3492 Malignant neoplasm of unspecified part of left bronchus or lung: Secondary | ICD-10-CM

## 2018-11-01 DIAGNOSIS — C7951 Secondary malignant neoplasm of bone: Secondary | ICD-10-CM | POA: Insufficient documentation

## 2018-11-01 DIAGNOSIS — Z5111 Encounter for antineoplastic chemotherapy: Secondary | ICD-10-CM

## 2018-11-01 LAB — CBC WITH DIFFERENTIAL (CANCER CENTER ONLY)
ABS IMMATURE GRANULOCYTES: 0.01 10*3/uL (ref 0.00–0.07)
BASOS ABS: 0 10*3/uL (ref 0.0–0.1)
Basophils Relative: 1 %
Eosinophils Absolute: 0.1 10*3/uL (ref 0.0–0.5)
Eosinophils Relative: 1 %
HEMATOCRIT: 38.2 % (ref 36.0–46.0)
HEMOGLOBIN: 12.3 g/dL (ref 12.0–15.0)
IMMATURE GRANULOCYTES: 0 %
LYMPHS PCT: 21 %
Lymphs Abs: 1.3 10*3/uL (ref 0.7–4.0)
MCH: 32.6 pg (ref 26.0–34.0)
MCHC: 32.2 g/dL (ref 30.0–36.0)
MCV: 101.3 fL — ABNORMAL HIGH (ref 80.0–100.0)
Monocytes Absolute: 0.6 10*3/uL (ref 0.1–1.0)
Monocytes Relative: 11 %
NEUTROS ABS: 3.9 10*3/uL (ref 1.7–7.7)
NRBC: 0 % (ref 0.0–0.2)
Neutrophils Relative %: 66 %
Platelet Count: 287 10*3/uL (ref 150–400)
RBC: 3.77 MIL/uL — AB (ref 3.87–5.11)
RDW: 12.3 % (ref 11.5–15.5)
WBC: 5.9 10*3/uL (ref 4.0–10.5)

## 2018-11-01 LAB — CMP (CANCER CENTER ONLY)
ALBUMIN: 3.7 g/dL (ref 3.5–5.0)
ALK PHOS: 126 U/L (ref 38–126)
ALT: 11 U/L (ref 0–44)
ANION GAP: 11 (ref 5–15)
AST: 17 U/L (ref 15–41)
BILIRUBIN TOTAL: 0.3 mg/dL (ref 0.3–1.2)
BUN: 12 mg/dL (ref 8–23)
CALCIUM: 9.5 mg/dL (ref 8.9–10.3)
CO2: 25 mmol/L (ref 22–32)
Chloride: 104 mmol/L (ref 98–111)
Creatinine: 0.72 mg/dL (ref 0.44–1.00)
GFR, Est AFR Am: >60 mL/min (ref >60)
GFR, Estimated: >60 mL/min (ref >60)
GLUCOSE: 87 mg/dL (ref 70–99)
Potassium: 4.8 mmol/L (ref 3.5–5.1)
SODIUM: 140 mmol/L (ref 135–145)
Total Protein: 7.4 g/dL (ref 6.5–8.1)

## 2018-11-01 NOTE — Telephone Encounter (Signed)
Scheduled appt per 12/3 los - gave patient AVS and calender per los.

## 2018-11-01 NOTE — Progress Notes (Signed)
[place], [place], MD [place] [address]  DIAGNOSIS:Stage IV (T2 a, N2, M1c)non-small cell carcinoma,adenocarcinoma, presented with left upper lobe lung mass in addition to multiple pleural-based nodules as well as suspicious bone metastasis to the left humeral neck, left ninth and 10th ribs as well as right femoral neck.  Biomarker Findings Tumor Mutational Burden - TMB-Intermediate (16 Muts/Mb) Microsatellite status - MS-Stable Genomic Findings For a complete list of the genes assayed, please refer to the Appendix. EGFR exon 19 deletion ([id number]_A750del), amplification MDM2 amplification EPHA3 V206L RB1 loss exons 18-27 TP53 R273H, R280K 7 Disease relevant genes with no reportable alterations: KRAS, ALK, BRAF, MET, RET, ERBB2, ROS1  PDL 1 expression is 50%.  PRIOR THERAPY:None  CURRENT THERAPY: Tagrisso 80 mg p.o. daily first dose October 05, 2018.  Status post 1 month of treatment.  INTERVAL HISTORY: Claudia Perez 67 y.o. female returns for routine follow-up visit accompanied by her husband.  The patient is feeling fine and has no specific complaints.  She has been tolerating her treatment with Tagrisso fairly well.  She denies fevers and chills.  Denies chest pain, shortness of breath, cough, hemoptysis.  Denies nausea, vomiting, constipation, diarrhea.  Denies recent weight loss or night sweats.  She reports a recent fall and is inquiring about a referral for home health physical therapy.  The patient is here for evaluation and repeat lab work.  MEDICAL HISTORY: Past Medical History:  Diagnosis Date  . Abnormality of gait   . Fall at home 03/27/2013   "lost consciousness; fractured right clavicle" (03/27/2013)  . GERD (gastroesophageal reflux disease)   . Hypercholesteremia   . Hyperthyroidism    "a little bit; don't take RX for it" (03/27/2013)  . Mobility impaired   . Other closed skull fracture  without mention of intracranial injury, unspecified state of consciousness   . Seizures ([facility])    "think I might have; been blacking out several times in the past" (03/27/2013)  . Shortness of breath    "even now, just talking" (03/27/2013)  . Subdural hemorrhage ([facility])   . Vitamin D deficiency     ALLERGIES:  is allergic to nitrofurantoin; phenazopyridine; sulfamethoxazole-trimethoprim; and sulfamethoxazole.  MEDICATIONS:  Current Outpatient Medications  Medication Sig Dispense Refill  . acetaminophen (TYLENOL) 500 MG tablet Take 1,000 mg by mouth daily.     . Multiple Vitamin (MULTIVITAMIN) capsule Take 1 capsule by mouth daily.    Marland Kitchen osimertinib mesylate (TAGRISSO) 80 MG tablet Take 1 tablet (80 mg total) by mouth daily. 30 tablet 1  . oxyCODONE-acetaminophen (PERCOCET/ROXICET) 5-325 MG tablet Take 1 tablet by mouth every 8 (eight) hours as needed for severe pain. (Patient not taking: Reported on 09/19/2018) 30 tablet 0  . Vitamin D, Ergocalciferol, 2000 units CAPS Take 1 capsule by mouth daily.      No current facility-administered medications for this visit.     SURGICAL HISTORY:  Past Surgical History:  Procedure Laterality Date  . APPENDECTOMY  1960's  . BRAIN SURGERY  10/2003   SDH evac  . EYE SURGERY  02/2013   cataract extraction w/ intraocular lens implant    REVIEW OF SYSTEMS:   Review of Systems  Constitutional: Negative for appetite change, chills, fatigue, fever and unexpected weight change.  HENT:   Negative for mouth sores, nosebleeds, sore throat and trouble swallowing.   Eyes: Negative for eye problems and icterus.  Respiratory: Negative for cough, hemoptysis, shortness of breath and  wheezing.   Cardiovascular: Negative for chest pain and leg swelling.  Gastrointestinal: Negative for abdominal pain, constipation, diarrhea, nausea and vomiting.  Genitourinary: Negative for bladder incontinence, difficulty urinating, dysuria, frequency and hematuria.    Musculoskeletal: Negative for back pain.  Skin: Negative for itching and rash.  Neurological: Negative for headaches and seizures.  Hematological: Negative for adenopathy. Does not bruise/bleed easily.  Psychiatric/Behavioral: Negative for confusion, depression and sleep disturbance. The patient is not nervous/anxious.     PHYSICAL EXAMINATION:  Blood pressure 90/78, pulse 68, temperature 98.5 F (36.9 C), temperature source Oral, resp. rate 14, height 5' 1"  (1.549 m), weight 98 lb 4.8 oz (44.6 kg), SpO2 100 %.  ECOG PERFORMANCE STATUS: 1 - Symptomatic but completely ambulatory  Physical Exam  Constitutional: Oriented to person, place, and time and well-developed, well-nourished, and in no distress. No distress.  HENT:  Head: Normocephalic and atraumatic.  Mouth/Throat: Oropharynx is clear and moist. No oropharyngeal exudate.  Eyes: Conjunctivae are normal. Right eye exhibits no discharge. Left eye exhibits no discharge. No scleral icterus.  Neck: Normal range of motion. Neck supple.  Cardiovascular: Normal rate, regular rhythm, normal heart sounds and intact distal pulses.   Pulmonary/Chest: Effort normal and breath sounds normal. No respiratory distress. No wheezes. No rales.  Abdominal: Soft. Bowel sounds are normal. Exhibits no distension and no mass. There is no tenderness.  Musculoskeletal: Normal range of motion.   Lymphadenopathy:    No cervical adenopathy.  Neurological: Alert and oriented to person, place, and time. Exhibits normal muscle tone.  Skin: Skin is warm and dry. No rash noted. Not diaphoretic. No erythema. No pallor.  Psychiatric: Mood, memory and judgment normal.  Vitals reviewed.  LABORATORY DATA: Lab Results  Component Value Date   WBC 5.9 11/01/2018   HGB 12.3 11/01/2018   HCT 38.2 11/01/2018   MCV 101.3 (H) 11/01/2018   PLT 287 11/01/2018      Chemistry      Component Value Date/Time   NA 140 11/01/2018 1504   K 4.8 11/01/2018 1504   CL 104  11/01/2018 1504   CO2 25 11/01/2018 1504   BUN 12 11/01/2018 1504   CREATININE 0.72 11/01/2018 1504   CREATININE 0.73 06/08/2014 1215      Component Value Date/Time   CALCIUM 9.5 11/01/2018 1504   ALKPHOS 126 11/01/2018 1504   AST 17 11/01/2018 1504   ALT 11 11/01/2018 1504   BILITOT 0.3 11/01/2018 1504       RADIOGRAPHIC STUDIES:  No results found.   ASSESSMENT/PLAN:  Primary adenocarcinoma of left lung ([place]) This is a very pleasant 67 year old African-American female diagnosed with a stage IV non-small cell lung cancer, adenocarcinoma with positive EGFR mutation with deletion in exon 19.  The patient was a started on treatment with Tagrisso 80 mg p.o. daily status post 1 month of treatment.  She has been tolerating this treatment well with no concerning adverse effects. Labs from today remain stable.  I recommended for the patient to continue her current treatment with Tagrisso with the same dose. She will have a restaging CT scan of the chest, abdomen, pelvis in approximately 1 month and then follow-up 1 to 2 days after the CT scan to discuss the results.  For gait instability and falls, I have placed a referral for home health physical therapy.  She was advised to call immediately if she has any concerning symptoms in the interval. The patient voices understanding of current disease status and treatment  options and is in agreement with the current care plan.  All questions were answered. The patient knows to call the clinic with any problems, questions or concerns. We can certainly see the patient much sooner if necessary.   Orders Placed This Encounter  Procedures  . CT ABDOMEN PELVIS W CONTRAST    Standing Status:   Future    Standing Expiration Date:   11/02/2019    Order Specific Question:   If indicated for the ordered procedure, I authorize the administration of contrast media per Radiology protocol    Answer:   Yes    Order Specific Question:   Preferred imaging  location?    Answer:   [facility]    Order Specific Question:   Radiology Contrast Protocol - do NOT remove file path    Answer:   \\[email or web address]    Order Specific Question:   ** REASON FOR EXAM (FREE TEXT)    Answer:   Lung cancer. Restaging.  . CT CHEST W CONTRAST    Standing Status:   Future    Standing Expiration Date:   11/02/2019    Order Specific Question:   If indicated for the ordered procedure, I authorize the administration of contrast media per Radiology protocol    Answer:   Yes    Order Specific Question:   Preferred imaging location?    Answer:   [facility]    Order Specific Question:   Radiology Contrast Protocol - do NOT remove file path    Answer:   \\[email or web address]    Order Specific Question:   ** REASON FOR EXAM (FREE TEXT)    Answer:   Lung cancer. Restaging.  Marland Kitchen CBC with Differential (Cancer Center Only)    Standing Status:   Future    Standing Expiration Date:   11/02/2019  . CMP ([facility] only)    Standing Status:   Future    Standing Expiration Date:   11/02/2019  . Ambulatory referral to Home Health    Referral Priority:   Routine    Referral Type:   Home Health Care    Referral Reason:   Specialty Services Required    Requested Specialty:   [place]    Number of Visits Requested:   [place], [place], AGPCNP-BC, [facility] 11/01/18

## 2018-11-02 NOTE — Assessment & Plan Note (Signed)
This is a very pleasant 67 year old African-American female diagnosed with a stage IV non-small cell lung cancer, adenocarcinoma with positive EGFR mutation with deletion in exon 78.  The patient was a started on treatment with Tagrisso 80 mg p.o. daily status post 1 month of treatment.  She has been tolerating this treatment well with no concerning adverse effects. Labs from today remain stable.  I recommended for the patient to continue her current treatment with Tagrisso with the same dose. She will have a restaging CT scan of the chest, abdomen, pelvis in approximately 1 month and then follow-up 1 to 2 days after the CT scan to discuss the results.  For gait instability and falls, I have placed a referral for home health physical therapy.  She was advised to call immediately if she has any concerning symptoms in the interval. The patient voices understanding of current disease status and treatment options and is in agreement with the current care plan.  All questions were answered. The patient knows to call the clinic with any problems, questions or concerns. We can certainly see the patient much sooner if necessary.

## 2018-11-03 DIAGNOSIS — R6884 Jaw pain: Secondary | ICD-10-CM | POA: Diagnosis not present

## 2018-11-15 DIAGNOSIS — Z9181 History of falling: Secondary | ICD-10-CM | POA: Diagnosis not present

## 2018-11-15 DIAGNOSIS — G249 Dystonia, unspecified: Secondary | ICD-10-CM | POA: Diagnosis not present

## 2018-11-15 DIAGNOSIS — R260 Ataxic gait: Secondary | ICD-10-CM | POA: Diagnosis not present

## 2018-11-15 DIAGNOSIS — M6281 Muscle weakness (generalized): Secondary | ICD-10-CM | POA: Diagnosis not present

## 2018-11-15 DIAGNOSIS — M5416 Radiculopathy, lumbar region: Secondary | ICD-10-CM | POA: Diagnosis not present

## 2018-11-15 DIAGNOSIS — R4189 Other symptoms and signs involving cognitive functions and awareness: Secondary | ICD-10-CM | POA: Diagnosis not present

## 2018-11-15 DIAGNOSIS — M4802 Spinal stenosis, cervical region: Secondary | ICD-10-CM | POA: Diagnosis not present

## 2018-11-15 DIAGNOSIS — G255 Other chorea: Secondary | ICD-10-CM | POA: Diagnosis not present

## 2018-11-16 DIAGNOSIS — R260 Ataxic gait: Secondary | ICD-10-CM | POA: Diagnosis not present

## 2018-11-16 DIAGNOSIS — R4189 Other symptoms and signs involving cognitive functions and awareness: Secondary | ICD-10-CM | POA: Diagnosis not present

## 2018-11-16 DIAGNOSIS — G255 Other chorea: Secondary | ICD-10-CM | POA: Diagnosis not present

## 2018-11-16 DIAGNOSIS — M4802 Spinal stenosis, cervical region: Secondary | ICD-10-CM | POA: Diagnosis not present

## 2018-11-16 DIAGNOSIS — M6281 Muscle weakness (generalized): Secondary | ICD-10-CM | POA: Diagnosis not present

## 2018-11-16 DIAGNOSIS — G249 Dystonia, unspecified: Secondary | ICD-10-CM | POA: Diagnosis not present

## 2018-11-17 DIAGNOSIS — R4189 Other symptoms and signs involving cognitive functions and awareness: Secondary | ICD-10-CM | POA: Diagnosis not present

## 2018-11-17 DIAGNOSIS — G249 Dystonia, unspecified: Secondary | ICD-10-CM | POA: Diagnosis not present

## 2018-11-17 DIAGNOSIS — M6281 Muscle weakness (generalized): Secondary | ICD-10-CM | POA: Diagnosis not present

## 2018-11-17 DIAGNOSIS — R260 Ataxic gait: Secondary | ICD-10-CM | POA: Diagnosis not present

## 2018-11-17 DIAGNOSIS — M4802 Spinal stenosis, cervical region: Secondary | ICD-10-CM | POA: Diagnosis not present

## 2018-11-17 DIAGNOSIS — G255 Other chorea: Secondary | ICD-10-CM | POA: Diagnosis not present

## 2018-11-18 ENCOUNTER — Telehealth: Payer: Self-pay | Admitting: *Deleted

## 2018-11-18 ENCOUNTER — Other Ambulatory Visit: Payer: Self-pay | Admitting: Oncology

## 2018-11-18 DIAGNOSIS — G255 Other chorea: Secondary | ICD-10-CM | POA: Diagnosis not present

## 2018-11-18 DIAGNOSIS — R260 Ataxic gait: Secondary | ICD-10-CM | POA: Diagnosis not present

## 2018-11-18 DIAGNOSIS — C3492 Malignant neoplasm of unspecified part of left bronchus or lung: Secondary | ICD-10-CM

## 2018-11-18 DIAGNOSIS — R4189 Other symptoms and signs involving cognitive functions and awareness: Secondary | ICD-10-CM | POA: Diagnosis not present

## 2018-11-18 DIAGNOSIS — M4802 Spinal stenosis, cervical region: Secondary | ICD-10-CM | POA: Diagnosis not present

## 2018-11-18 DIAGNOSIS — G249 Dystonia, unspecified: Secondary | ICD-10-CM | POA: Diagnosis not present

## 2018-11-18 DIAGNOSIS — M6281 Muscle weakness (generalized): Secondary | ICD-10-CM | POA: Diagnosis not present

## 2018-11-18 MED ORDER — MAGIC MOUTHWASH W/LIDOCAINE: 5.0000 mL | Freq: Three times a day (TID) | ORAL | 0 refills | Status: DC | PRN

## 2018-11-18 NOTE — Telephone Encounter (Signed)
[facility] called to report that patient states she has mouth sores. This can be caused by Tagrisso, they suggested magic mouth wash.

## 2018-11-21 DIAGNOSIS — R4189 Other symptoms and signs involving cognitive functions and awareness: Secondary | ICD-10-CM | POA: Diagnosis not present

## 2018-11-21 DIAGNOSIS — M4802 Spinal stenosis, cervical region: Secondary | ICD-10-CM | POA: Diagnosis not present

## 2018-11-21 DIAGNOSIS — R260 Ataxic gait: Secondary | ICD-10-CM | POA: Diagnosis not present

## 2018-11-21 DIAGNOSIS — M6281 Muscle weakness (generalized): Secondary | ICD-10-CM | POA: Diagnosis not present

## 2018-11-21 DIAGNOSIS — G255 Other chorea: Secondary | ICD-10-CM | POA: Diagnosis not present

## 2018-11-21 DIAGNOSIS — G249 Dystonia, unspecified: Secondary | ICD-10-CM | POA: Diagnosis not present

## 2018-11-24 DIAGNOSIS — R260 Ataxic gait: Secondary | ICD-10-CM | POA: Diagnosis not present

## 2018-11-24 DIAGNOSIS — G255 Other chorea: Secondary | ICD-10-CM | POA: Diagnosis not present

## 2018-11-24 DIAGNOSIS — M4802 Spinal stenosis, cervical region: Secondary | ICD-10-CM | POA: Diagnosis not present

## 2018-11-24 DIAGNOSIS — M6281 Muscle weakness (generalized): Secondary | ICD-10-CM | POA: Diagnosis not present

## 2018-11-24 DIAGNOSIS — G249 Dystonia, unspecified: Secondary | ICD-10-CM | POA: Diagnosis not present

## 2018-11-24 DIAGNOSIS — R4189 Other symptoms and signs involving cognitive functions and awareness: Secondary | ICD-10-CM | POA: Diagnosis not present

## 2018-11-25 DIAGNOSIS — M6281 Muscle weakness (generalized): Secondary | ICD-10-CM | POA: Diagnosis not present

## 2018-11-25 DIAGNOSIS — R260 Ataxic gait: Secondary | ICD-10-CM | POA: Diagnosis not present

## 2018-11-25 DIAGNOSIS — R4189 Other symptoms and signs involving cognitive functions and awareness: Secondary | ICD-10-CM | POA: Diagnosis not present

## 2018-11-25 DIAGNOSIS — M4802 Spinal stenosis, cervical region: Secondary | ICD-10-CM | POA: Diagnosis not present

## 2018-11-25 DIAGNOSIS — G249 Dystonia, unspecified: Secondary | ICD-10-CM | POA: Diagnosis not present

## 2018-11-25 DIAGNOSIS — G255 Other chorea: Secondary | ICD-10-CM | POA: Diagnosis not present

## 2018-11-25 MED FILL — TAGRISSO 80 MG TABLET: 80 | 30 days supply | Qty: 30 | Fill #0

## 2018-11-28 DIAGNOSIS — R4189 Other symptoms and signs involving cognitive functions and awareness: Secondary | ICD-10-CM | POA: Diagnosis not present

## 2018-11-28 DIAGNOSIS — G249 Dystonia, unspecified: Secondary | ICD-10-CM | POA: Diagnosis not present

## 2018-11-28 DIAGNOSIS — M6281 Muscle weakness (generalized): Secondary | ICD-10-CM | POA: Diagnosis not present

## 2018-11-28 DIAGNOSIS — R260 Ataxic gait: Secondary | ICD-10-CM | POA: Diagnosis not present

## 2018-11-28 DIAGNOSIS — G255 Other chorea: Secondary | ICD-10-CM | POA: Diagnosis not present

## 2018-11-28 DIAGNOSIS — M4802 Spinal stenosis, cervical region: Secondary | ICD-10-CM | POA: Diagnosis not present

## 2018-11-29 DIAGNOSIS — M4802 Spinal stenosis, cervical region: Secondary | ICD-10-CM | POA: Diagnosis not present

## 2018-11-29 DIAGNOSIS — M6281 Muscle weakness (generalized): Secondary | ICD-10-CM | POA: Diagnosis not present

## 2018-11-29 DIAGNOSIS — G249 Dystonia, unspecified: Secondary | ICD-10-CM | POA: Diagnosis not present

## 2018-11-29 DIAGNOSIS — R260 Ataxic gait: Secondary | ICD-10-CM | POA: Diagnosis not present

## 2018-11-29 DIAGNOSIS — G255 Other chorea: Secondary | ICD-10-CM | POA: Diagnosis not present

## 2018-11-29 DIAGNOSIS — R4189 Other symptoms and signs involving cognitive functions and awareness: Secondary | ICD-10-CM | POA: Diagnosis not present

## 2018-12-01 DIAGNOSIS — R260 Ataxic gait: Secondary | ICD-10-CM | POA: Diagnosis not present

## 2018-12-01 DIAGNOSIS — M4802 Spinal stenosis, cervical region: Secondary | ICD-10-CM | POA: Diagnosis not present

## 2018-12-01 DIAGNOSIS — R4189 Other symptoms and signs involving cognitive functions and awareness: Secondary | ICD-10-CM | POA: Diagnosis not present

## 2018-12-01 DIAGNOSIS — G255 Other chorea: Secondary | ICD-10-CM | POA: Diagnosis not present

## 2018-12-01 DIAGNOSIS — M6281 Muscle weakness (generalized): Secondary | ICD-10-CM | POA: Diagnosis not present

## 2018-12-01 DIAGNOSIS — G249 Dystonia, unspecified: Secondary | ICD-10-CM | POA: Diagnosis not present

## 2018-12-02 DIAGNOSIS — G255 Other chorea: Secondary | ICD-10-CM | POA: Diagnosis not present

## 2018-12-02 DIAGNOSIS — R4189 Other symptoms and signs involving cognitive functions and awareness: Secondary | ICD-10-CM | POA: Diagnosis not present

## 2018-12-02 DIAGNOSIS — M6281 Muscle weakness (generalized): Secondary | ICD-10-CM | POA: Diagnosis not present

## 2018-12-02 DIAGNOSIS — M4802 Spinal stenosis, cervical region: Secondary | ICD-10-CM | POA: Diagnosis not present

## 2018-12-02 DIAGNOSIS — G249 Dystonia, unspecified: Secondary | ICD-10-CM | POA: Diagnosis not present

## 2018-12-02 DIAGNOSIS — R260 Ataxic gait: Secondary | ICD-10-CM | POA: Diagnosis not present

## 2018-12-05 DIAGNOSIS — M6281 Muscle weakness (generalized): Secondary | ICD-10-CM | POA: Diagnosis not present

## 2018-12-05 DIAGNOSIS — R260 Ataxic gait: Secondary | ICD-10-CM | POA: Diagnosis not present

## 2018-12-05 DIAGNOSIS — G255 Other chorea: Secondary | ICD-10-CM | POA: Diagnosis not present

## 2018-12-05 DIAGNOSIS — M4802 Spinal stenosis, cervical region: Secondary | ICD-10-CM | POA: Diagnosis not present

## 2018-12-05 DIAGNOSIS — G249 Dystonia, unspecified: Secondary | ICD-10-CM | POA: Diagnosis not present

## 2018-12-05 DIAGNOSIS — R4189 Other symptoms and signs involving cognitive functions and awareness: Secondary | ICD-10-CM | POA: Diagnosis not present

## 2018-12-06 DIAGNOSIS — M6281 Muscle weakness (generalized): Secondary | ICD-10-CM | POA: Diagnosis not present

## 2018-12-06 DIAGNOSIS — R4189 Other symptoms and signs involving cognitive functions and awareness: Secondary | ICD-10-CM | POA: Diagnosis not present

## 2018-12-06 DIAGNOSIS — G249 Dystonia, unspecified: Secondary | ICD-10-CM | POA: Diagnosis not present

## 2018-12-06 DIAGNOSIS — R260 Ataxic gait: Secondary | ICD-10-CM | POA: Diagnosis not present

## 2018-12-06 DIAGNOSIS — M4802 Spinal stenosis, cervical region: Secondary | ICD-10-CM | POA: Diagnosis not present

## 2018-12-06 DIAGNOSIS — G255 Other chorea: Secondary | ICD-10-CM | POA: Diagnosis not present

## 2018-12-07 DIAGNOSIS — M4802 Spinal stenosis, cervical region: Secondary | ICD-10-CM | POA: Diagnosis not present

## 2018-12-07 DIAGNOSIS — M6281 Muscle weakness (generalized): Secondary | ICD-10-CM | POA: Diagnosis not present

## 2018-12-07 DIAGNOSIS — R4189 Other symptoms and signs involving cognitive functions and awareness: Secondary | ICD-10-CM | POA: Diagnosis not present

## 2018-12-07 DIAGNOSIS — G255 Other chorea: Secondary | ICD-10-CM | POA: Diagnosis not present

## 2018-12-07 DIAGNOSIS — R260 Ataxic gait: Secondary | ICD-10-CM | POA: Diagnosis not present

## 2018-12-07 DIAGNOSIS — G249 Dystonia, unspecified: Secondary | ICD-10-CM | POA: Diagnosis not present

## 2018-12-08 DIAGNOSIS — R4189 Other symptoms and signs involving cognitive functions and awareness: Secondary | ICD-10-CM | POA: Diagnosis not present

## 2018-12-08 DIAGNOSIS — G249 Dystonia, unspecified: Secondary | ICD-10-CM | POA: Diagnosis not present

## 2018-12-08 DIAGNOSIS — M6281 Muscle weakness (generalized): Secondary | ICD-10-CM | POA: Diagnosis not present

## 2018-12-08 DIAGNOSIS — G255 Other chorea: Secondary | ICD-10-CM | POA: Diagnosis not present

## 2018-12-08 DIAGNOSIS — R260 Ataxic gait: Secondary | ICD-10-CM | POA: Diagnosis not present

## 2018-12-08 DIAGNOSIS — M4802 Spinal stenosis, cervical region: Secondary | ICD-10-CM | POA: Diagnosis not present

## 2018-12-09 ENCOUNTER — Inpatient Hospital Stay: Payer: Medicare Other | Attending: Internal Medicine

## 2018-12-09 ENCOUNTER — Ambulatory Visit (HOSPITAL_COMMUNITY): Admission: RE | Admit: 2018-12-09 | Payer: Medicare Other | Source: Ambulatory Visit

## 2018-12-09 ENCOUNTER — Telehealth: Payer: Self-pay | Admitting: *Deleted

## 2018-12-09 DIAGNOSIS — C3412 Malignant neoplasm of upper lobe, left bronchus or lung: Secondary | ICD-10-CM | POA: Insufficient documentation

## 2018-12-09 DIAGNOSIS — C7951 Secondary malignant neoplasm of bone: Secondary | ICD-10-CM | POA: Insufficient documentation

## 2018-12-09 DIAGNOSIS — C3492 Malignant neoplasm of unspecified part of left bronchus or lung: Secondary | ICD-10-CM

## 2018-12-09 LAB — CMP (CANCER CENTER ONLY)
ALK PHOS: 73 U/L (ref 38–126)
ALT: 14 U/L (ref 0–44)
ANION GAP: 8 (ref 5–15)
AST: 21 U/L (ref 15–41)
Albumin: 3.8 g/dL (ref 3.5–5.0)
BUN: 11 mg/dL (ref 8–23)
CALCIUM: 9.4 mg/dL (ref 8.9–10.3)
CO2: 27 mmol/L (ref 22–32)
CREATININE: 0.77 mg/dL (ref 0.44–1.00)
Chloride: 103 mmol/L (ref 98–111)
Glucose, Bld: 146 mg/dL — ABNORMAL HIGH (ref 70–99)
Potassium: 4.4 mmol/L (ref 3.5–5.1)
SODIUM: 138 mmol/L (ref 135–145)
TOTAL PROTEIN: 7.1 g/dL (ref 6.5–8.1)
Total Bilirubin: 0.3 mg/dL (ref 0.3–1.2)

## 2018-12-09 LAB — CBC WITH DIFFERENTIAL (CANCER CENTER ONLY)
Abs Immature Granulocytes: 0.01 10*3/uL (ref 0.00–0.07)
BASOS ABS: 0 10*3/uL (ref 0.0–0.1)
BASOS PCT: 0 %
EOS ABS: 0 10*3/uL (ref 0.0–0.5)
EOS PCT: 0 %
HCT: 40.6 % (ref 36.0–46.0)
Hemoglobin: 13.1 g/dL (ref 12.0–15.0)
IMMATURE GRANULOCYTES: 0 %
Lymphocytes Relative: 28 %
Lymphs Abs: 1.5 10*3/uL (ref 0.7–4.0)
MCH: 32 pg (ref 26.0–34.0)
MCHC: 32.3 g/dL (ref 30.0–36.0)
MCV: 99 fL (ref 80.0–100.0)
Monocytes Absolute: 0.7 10*3/uL (ref 0.1–1.0)
Monocytes Relative: 13 %
NEUTROS PCT: 59 %
Neutro Abs: 3.1 10*3/uL (ref 1.7–7.7)
PLATELETS: 223 10*3/uL (ref 150–400)
RBC: 4.1 MIL/uL (ref 3.87–5.11)
RDW: 12.5 % (ref 11.5–15.5)
WBC: 5.3 10*3/uL (ref 4.0–10.5)
nRBC: 0 % (ref 0.0–0.2)

## 2018-12-09 NOTE — Telephone Encounter (Signed)
TC from [place] in Radiology after 3pm to say that patient did not show for CT scan of CAP. She did get her labs done but did not go to radiology. Attempted to call patient but there was no answer/unable to leave message. Patient is scheduled to see Dr. Julien Nordmann on Monday 12/12/18 to review scans (that were not done)

## 2018-12-12 ENCOUNTER — Encounter: Payer: Self-pay | Admitting: Internal Medicine

## 2018-12-12 ENCOUNTER — Inpatient Hospital Stay (HOSPITAL_BASED_OUTPATIENT_CLINIC_OR_DEPARTMENT_OTHER): Payer: Medicare Other | Admitting: Internal Medicine

## 2018-12-12 ENCOUNTER — Telehealth: Payer: Self-pay | Admitting: Internal Medicine

## 2018-12-12 ENCOUNTER — Encounter: Payer: Self-pay | Admitting: *Deleted

## 2018-12-12 ENCOUNTER — Other Ambulatory Visit: Payer: Self-pay | Admitting: *Deleted

## 2018-12-12 VITALS — BP 136/75 | HR 75 | Temp 98.0°F | Resp 18 | Ht 61.0 in | Wt 97.8 lb

## 2018-12-12 DIAGNOSIS — C3492 Malignant neoplasm of unspecified part of left bronchus or lung: Secondary | ICD-10-CM

## 2018-12-12 DIAGNOSIS — C7951 Secondary malignant neoplasm of bone: Secondary | ICD-10-CM

## 2018-12-12 DIAGNOSIS — C3412 Malignant neoplasm of upper lobe, left bronchus or lung: Secondary | ICD-10-CM | POA: Diagnosis not present

## 2018-12-12 DIAGNOSIS — Z5111 Encounter for antineoplastic chemotherapy: Secondary | ICD-10-CM

## 2018-12-12 NOTE — Telephone Encounter (Signed)
Printed calendar and avs. °

## 2018-12-12 NOTE — Research (Signed)
Human Biospecimens for the Discovery and Validation of Biomarkers for the Prediction, Diagnosis and Management of Disease  ADX01 - [id number] PI: Bobbye Riggs, MD Attending:  Visit: Consent  X Study was introduced by the attending physician.  X Patient had the opportunity to ask questions.  X Consenting personnel has reviewed consent in entirety with patient.  Discussion included, but not limited to: protocol expectations/evaluations, side effects, risks and benefits of the procedures associated with the study.  X Patient verbalizes good understanding of all instructions and information in consent, including the voluntary nature of participation, and agrees to comply with all protocol requirements.  X The patient wishes to participate in this clinical trial and willingly signed the consent document.  X Copy of signed consent was given to the patient.  The original was scanned to medial records and the original will be placed in the subject's research chart.  X No study-specific procedures were done prior to consent.  Any study specific tests/procedures will now be scheduled and completed per protocol for screening purposes.  X  The patient is aware that their insurance will not be billed as this is not a pharmaceutical trial and we are usually just collecting labs at an already scheduled lab visit.    X Patient signed the ICF (IRB approved 09/10/2017), HIPPA (IRB approved 06/18/2017) forms on 12/12/2018 at 12:24 pm  X The patient was provided with the contact information for the PI and the IRB if they have any research related questions  X The PI was notified that the patient consented to the study   Patient will provide blood at next lab appointment to complete study enrollment.  A VISA giftcard was provided as specified by the protocol. [facility] 12/12/18

## 2018-12-12 NOTE — Progress Notes (Signed)
    [facility] [phone]   [phone]  OFFICE PROGRESS NOTE  Perez, Elizabeth, MD [address] [place] [address]  DIAGNOSIS: Stage IV (T2 a, N2, M1c)non-small cell carcinoma, adenocarcinoma, presented with left upper lobe lung mass in addition to multiple pleural-based nodules as well as suspicious bone metastasis to the left humeral neck, left ninth and 10th ribs as well as right femoral neck.  Biomarker Findings Tumor Mutational Burden - TMB-Intermediate (16 Muts/Mb) Microsatellite status - MS-Stable Genomic Findings For a complete list of the genes assayed, please refer to the Appendix. EGFR exon 19 deletion ([id number]_A750del), amplification MDM2 amplification EPHA3 V206L RB1 loss exons 18-27 TP53 R273H, R280K 7 Disease relevant genes with no reportable alterations: KRAS, ALK, BRAF, MET, RET, ERBB2, ROS1  PDL 1 expression is 50%.  PRIOR THERAPY: None  CURRENT THERAPY: Tagrisso 80 mg p.o. daily first dose October 05, 2018.  INTERVAL HISTORY: Claudia Perez 67 y.o. female returns to the clinic today for follow-up visit.  The patient is feeling fine today with no specific complaints except for the generalized fatigue and weakness.  She is tolerating her treatment with Tagrisso fairly well with no concerning adverse effects.  She denied having any skin rash or diarrhea.  She has no chest pain, shortness of breath, cough or hemoptysis.  She denied having any significant weight loss or night sweats.  She was supposed to have repeat CT scan of the chest, abdomen pelvis before this visit but unfortunately her scan was not scheduled as ordered.  She is here today for evaluation and repeat blood work.   MEDICAL HISTORY: Past Medical History:  Diagnosis Date  . Abnormality of gait   . Fall at home 03/27/2013   "lost consciousness; fractured right clavicle" (03/27/2013)  . GERD (gastroesophageal reflux disease)   . Hypercholesteremia   .  Hyperthyroidism    "a little bit; don't take RX for it" (03/27/2013)  . Mobility impaired   . Other closed skull fracture without mention of intracranial injury, unspecified state of consciousness   . Seizures ([facility])    "think I might have; been blacking out several times in the past" (03/27/2013)  . Shortness of breath    "even now, just talking" (03/27/2013)  . Subdural hemorrhage ([facility])   . Vitamin D deficiency     ALLERGIES:  is allergic to nitrofurantoin; phenazopyridine; sulfamethoxazole-trimethoprim; and sulfamethoxazole.  MEDICATIONS:  Current Outpatient Medications  Medication Sig Dispense Refill  . acetaminophen (TYLENOL) 500 MG tablet Take 1,000 mg by mouth daily.     . magic mouthwash w/lidocaine SOLN Take 5 mLs by mouth 3 (three) times daily as needed for mouth pain. 120 mL 0  . Multiple Vitamin (MULTIVITAMIN) capsule Take 1 capsule by mouth daily.    . oxyCODONE-acetaminophen (PERCOCET/ROXICET) 5-325 MG tablet Take 1 tablet by mouth every 8 (eight) hours as needed for severe pain. (Patient not taking: Reported on 09/19/2018) 30 tablet 0  . TAGRISSO 80 MG tablet TAKE 1 TABLET (80 MG TOTAL) BY MOUTH DAILY. 30 tablet 1  . Vitamin D, Ergocalciferol, 2000 units CAPS Take 1 capsule by mouth daily.      No current facility-administered medications for this visit.     SURGICAL HISTORY:  Past Surgical History:  Procedure Laterality Date  . APPENDECTOMY  1960's  . BRAIN SURGERY  10/2003   SDH evac  . EYE SURGERY  02/2013   cataract extraction w/ intraocular lens implant    REVIEW OF SYSTEMS:    A comprehensive review of systems was negative except for: Constitutional: positive for fatigue Musculoskeletal: positive for muscle weakness   PHYSICAL EXAMINATION: General appearance: alert, cooperative, fatigued and no distress Head: Normocephalic, without obvious abnormality, atraumatic Neck: no adenopathy, no JVD, supple, symmetrical, trachea midline and thyroid not enlarged,  symmetric, no tenderness/mass/nodules Lymph nodes: Cervical, supraclavicular, and axillary nodes normal. Resp: clear to auscultation bilaterally Back: symmetric, no curvature. ROM normal. No CVA tenderness. Cardio: regular rate and rhythm, S1, S2 normal, no murmur, click, rub or gallop GI: soft, non-tender; bowel sounds normal; no masses,  no organomegaly Extremities: extremities normal, atraumatic, no cyanosis or edema  ECOG PERFORMANCE STATUS: 1 - Symptomatic but completely ambulatory  Blood pressure 136/75, pulse 75, temperature 98 F (36.7 C), temperature source Oral, resp. rate 18, height 5' 1" (1.549 m), weight 97 lb 12.8 oz (44.4 kg), SpO2 99 %.  LABORATORY DATA: Lab Results  Component Value Date   WBC 5.3 12/09/2018   HGB 13.1 12/09/2018   HCT 40.6 12/09/2018   MCV 99.0 12/09/2018   PLT 223 12/09/2018      Chemistry      Component Value Date/Time   NA 138 12/09/2018 1140   K 4.4 12/09/2018 1140   CL 103 12/09/2018 1140   CO2 27 12/09/2018 1140   BUN 11 12/09/2018 1140   CREATININE 0.77 12/09/2018 1140   CREATININE 0.73 06/08/2014 1215      Component Value Date/Time   CALCIUM 9.4 12/09/2018 1140   ALKPHOS 73 12/09/2018 1140   AST 21 12/09/2018 1140   ALT 14 12/09/2018 1140   BILITOT 0.3 12/09/2018 1140       RADIOGRAPHIC STUDIES: No results found.  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  The patient was a started on treatment with Tagrisso 80 mg p.o. daily status post 2 months of treatment. The patient is tolerating this treatment well with no concerning adverse effects. I recommended for her to continue her current treatment with Tagrisso with the same dose. I will reschedule her CT scan of the chest, abdomen and pelvis to be performed before her next visit in 1 months. The patient was advised  to call immediately if she has any concerning symptoms in the interval. The patient voices understanding of current disease status and treatment options and is in agreement with the current care plan. All questions were answered. The patient knows to call the clinic with any problems, questions or concerns. We can certainly see the patient much sooner if necessary.  I spent 10 minutes counseling the patient face to face. The total time spent in the appointment was 15 minutes.  Disclaimer: This note was dictated with voice recognition software. Similar sounding words can inadvertently be transcribed and may not be corrected upon review.       

## 2018-12-16 DIAGNOSIS — R4189 Other symptoms and signs involving cognitive functions and awareness: Secondary | ICD-10-CM | POA: Diagnosis not present

## 2018-12-16 DIAGNOSIS — M6281 Muscle weakness (generalized): Secondary | ICD-10-CM | POA: Diagnosis not present

## 2018-12-16 DIAGNOSIS — R260 Ataxic gait: Secondary | ICD-10-CM | POA: Diagnosis not present

## 2018-12-16 DIAGNOSIS — M4802 Spinal stenosis, cervical region: Secondary | ICD-10-CM | POA: Diagnosis not present

## 2018-12-16 DIAGNOSIS — G255 Other chorea: Secondary | ICD-10-CM | POA: Diagnosis not present

## 2018-12-16 DIAGNOSIS — G249 Dystonia, unspecified: Secondary | ICD-10-CM | POA: Diagnosis not present

## 2018-12-20 DIAGNOSIS — G249 Dystonia, unspecified: Secondary | ICD-10-CM | POA: Diagnosis not present

## 2018-12-20 DIAGNOSIS — Z881 Allergy status to other antibiotic agents status: Secondary | ICD-10-CM | POA: Diagnosis not present

## 2018-12-20 DIAGNOSIS — Z87820 Personal history of traumatic brain injury: Secondary | ICD-10-CM | POA: Diagnosis not present

## 2018-12-20 DIAGNOSIS — G255 Other chorea: Secondary | ICD-10-CM | POA: Diagnosis not present

## 2018-12-20 DIAGNOSIS — R270 Ataxia, unspecified: Secondary | ICD-10-CM | POA: Diagnosis not present

## 2018-12-20 DIAGNOSIS — G9389 Other specified disorders of brain: Secondary | ICD-10-CM | POA: Diagnosis not present

## 2018-12-20 DIAGNOSIS — Z9889 Other specified postprocedural states: Secondary | ICD-10-CM | POA: Diagnosis not present

## 2018-12-20 DIAGNOSIS — G319 Degenerative disease of nervous system, unspecified: Secondary | ICD-10-CM | POA: Diagnosis not present

## 2018-12-20 DIAGNOSIS — Z882 Allergy status to sulfonamides status: Secondary | ICD-10-CM | POA: Diagnosis not present

## 2018-12-20 DIAGNOSIS — Z8679 Personal history of other diseases of the circulatory system: Secondary | ICD-10-CM | POA: Diagnosis not present

## 2018-12-21 DIAGNOSIS — R260 Ataxic gait: Secondary | ICD-10-CM | POA: Diagnosis not present

## 2018-12-21 DIAGNOSIS — R4189 Other symptoms and signs involving cognitive functions and awareness: Secondary | ICD-10-CM | POA: Diagnosis not present

## 2018-12-21 DIAGNOSIS — G249 Dystonia, unspecified: Secondary | ICD-10-CM | POA: Diagnosis not present

## 2018-12-21 DIAGNOSIS — M4802 Spinal stenosis, cervical region: Secondary | ICD-10-CM | POA: Diagnosis not present

## 2018-12-21 DIAGNOSIS — G255 Other chorea: Secondary | ICD-10-CM | POA: Diagnosis not present

## 2018-12-21 DIAGNOSIS — M6281 Muscle weakness (generalized): Secondary | ICD-10-CM | POA: Diagnosis not present

## 2018-12-23 ENCOUNTER — Telehealth: Payer: Self-pay | Admitting: Pharmacist

## 2018-12-23 DIAGNOSIS — M4802 Spinal stenosis, cervical region: Secondary | ICD-10-CM | POA: Diagnosis not present

## 2018-12-23 DIAGNOSIS — G255 Other chorea: Secondary | ICD-10-CM | POA: Diagnosis not present

## 2018-12-23 DIAGNOSIS — R260 Ataxic gait: Secondary | ICD-10-CM | POA: Diagnosis not present

## 2018-12-23 DIAGNOSIS — M6281 Muscle weakness (generalized): Secondary | ICD-10-CM | POA: Diagnosis not present

## 2018-12-23 DIAGNOSIS — G249 Dystonia, unspecified: Secondary | ICD-10-CM | POA: Diagnosis not present

## 2018-12-23 DIAGNOSIS — R4189 Other symptoms and signs involving cognitive functions and awareness: Secondary | ICD-10-CM | POA: Diagnosis not present

## 2018-12-23 NOTE — Telephone Encounter (Signed)
Oral Oncology Pharmacist Encounter  Received call from [employer] that [employer] has been unable to get delivery signature from patient to deliver this last fill of Tagrisso. Their final attempt was 12/22/2018 Tagrisso prescription is at the [facility] facility at [facility], [place], [place], [address], and would be held there for 2 business days.  Tracking [id number]  I called patient and provided above information with instructions for patient to pick up her next fill of Tagrisso from this [employer] facility as soon as possible.  Patient expressed understanding and agreement with above plan. She knows to call the office with any additional questions or concerns.  Johny Drilling, PharmD, BCPS, BCOP  12/23/2018 1:50 PM Oral Oncology Clinic [phone]

## 2018-12-26 ENCOUNTER — Emergency Department (HOSPITAL_COMMUNITY)
Admission: EM | Admit: 2018-12-26 | Discharge: 2018-12-27 | Disposition: A | Discharge status: Home / Self Care | Payer: Medicare Other | Attending: Emergency Medicine | Admitting: Emergency Medicine

## 2018-12-26 ENCOUNTER — Encounter (HOSPITAL_COMMUNITY): Payer: Self-pay

## 2018-12-26 ENCOUNTER — Emergency Department (HOSPITAL_COMMUNITY): Payer: Medicare Other

## 2018-12-26 DIAGNOSIS — M542 Cervicalgia: Secondary | ICD-10-CM | POA: Diagnosis not present

## 2018-12-26 DIAGNOSIS — H81399 Other peripheral vertigo, unspecified ear: Secondary | ICD-10-CM | POA: Diagnosis not present

## 2018-12-26 DIAGNOSIS — R296 Repeated falls: Secondary | ICD-10-CM | POA: Diagnosis not present

## 2018-12-26 DIAGNOSIS — G9389 Other specified disorders of brain: Secondary | ICD-10-CM | POA: Diagnosis not present

## 2018-12-26 DIAGNOSIS — R42 Dizziness and giddiness: Secondary | ICD-10-CM | POA: Insufficient documentation

## 2018-12-26 DIAGNOSIS — T1490XA Injury, unspecified, initial encounter: Secondary | ICD-10-CM

## 2018-12-26 DIAGNOSIS — Z79899 Other long term (current) drug therapy: Secondary | ICD-10-CM | POA: Insufficient documentation

## 2018-12-26 DIAGNOSIS — M25512 Pain in left shoulder: Secondary | ICD-10-CM | POA: Diagnosis not present

## 2018-12-26 LAB — CBC
HCT: 41 % (ref 36.0–46.0)
Hemoglobin: 13 g/dL (ref 12.0–15.0)
MCH: 31.6 pg (ref 26.0–34.0)
MCHC: 31.7 g/dL (ref 30.0–36.0)
MCV: 99.8 fL (ref 80.0–100.0)
Platelets: 225 10*3/uL (ref 150–400)
RBC: 4.11 MIL/uL (ref 3.87–5.11)
RDW: 13 % (ref 11.5–15.5)
WBC: 6.9 10*3/uL (ref 4.0–10.5)
nRBC: 0 % (ref 0.0–0.2)

## 2018-12-26 LAB — BASIC METABOLIC PANEL
Anion gap: 9 (ref 5–15)
BUN: 16 mg/dL (ref 8–23)
CO2: 24 mmol/L (ref 22–32)
Calcium: 9.5 mg/dL (ref 8.9–10.3)
Chloride: 104 mmol/L (ref 98–111)
Creatinine, Ser: 0.63 mg/dL (ref 0.44–1.00)
GFR calc non Af Amer: >60 mL/min (ref >60)
Glucose, Bld: 125 mg/dL — ABNORMAL HIGH (ref 70–99)
Potassium: 4.2 mmol/L (ref 3.5–5.1)
SODIUM: 137 mmol/L (ref 135–145)

## 2018-12-26 LAB — I-STAT TROPONIN, ED: Troponin i, poc: 0 ng/mL (ref 0.00–0.08)

## 2018-12-26 MED ORDER — SODIUM CHLORIDE 0.9% FLUSH
3.0000 mL | Freq: Once | INTRAVENOUS | Status: AC
  Administered 2018-12-27: 3 mL via INTRAVENOUS

## 2018-12-26 NOTE — ED Notes (Signed)
Pt's family member has approached this Tech again about the wait time. Pt's family member livid about the wait time. Pt informed that there are a lot of sick pt's here today. Pt's family member walked away.

## 2018-12-26 NOTE — ED Triage Notes (Signed)
Pt from home with complaint of bilateral neck pain with dizziness that is worse this morning. Pt states that she has had neck problems for a long time. Pt has hx of TBI and is at baseline per family.

## 2018-12-26 NOTE — ED Notes (Signed)
Pt's family member has approached this Tech about the wait time. Family member is stating that he is going to take her (patient) home, if she does not get a room soon. Raquel Sarna - RN aware.

## 2018-12-27 ENCOUNTER — Emergency Department (HOSPITAL_COMMUNITY): Payer: Medicare Other

## 2018-12-27 ENCOUNTER — Other Ambulatory Visit: Payer: Self-pay

## 2018-12-27 DIAGNOSIS — H81399 Other peripheral vertigo, unspecified ear: Secondary | ICD-10-CM | POA: Diagnosis not present

## 2018-12-27 DIAGNOSIS — M25512 Pain in left shoulder: Secondary | ICD-10-CM | POA: Diagnosis not present

## 2018-12-27 DIAGNOSIS — M542 Cervicalgia: Secondary | ICD-10-CM | POA: Diagnosis not present

## 2018-12-27 DIAGNOSIS — G9389 Other specified disorders of brain: Secondary | ICD-10-CM | POA: Diagnosis not present

## 2018-12-27 LAB — I-STAT TROPONIN, ED: Troponin i, poc: 0.01 ng/mL (ref 0.00–0.08)

## 2018-12-27 MED ORDER — IOPAMIDOL (ISOVUE-370) INJECTION 76%
INTRAVENOUS | Status: AC
  Administered 2018-12-27: 75 mL
  Filled 2018-12-27: qty 100

## 2018-12-27 MED ORDER — LORAZEPAM 2 MG/ML IJ SOLN
0.5000 mg | Freq: Once | INTRAMUSCULAR | Status: AC
  Administered 2018-12-27: 0.5 mg via INTRAVENOUS
  Filled 2018-12-27: qty 1

## 2018-12-27 MED ORDER — GADOBUTROL 1 MMOL/ML IV SOLN
5.0000 mL | Freq: Once | INTRAVENOUS | Status: AC | PRN
  Administered 2018-12-27: 5 mL via INTRAVENOUS

## 2018-12-27 NOTE — Discharge Instructions (Signed)
Your testing shows no injury from your falls and no evidence of stroke.  You should follow-up with your primary doctor and neurologist.  Return to the ED if you develop new or worsening symptoms.

## 2018-12-27 NOTE — ED Notes (Signed)
ED Provider at bedside. 

## 2018-12-27 NOTE — ED Notes (Signed)
Reviewed d/c instructions with pt, who verbalized understanding and had no outstanding questions. Pt departed in NAD.   

## 2018-12-27 NOTE — ED Notes (Signed)
Patient transported to X-ray 

## 2018-12-27 NOTE — ED Notes (Signed)
Patient transported to CT 

## 2018-12-27 NOTE — ED Notes (Signed)
Patient transported to MRI 

## 2018-12-27 NOTE — ED Provider Notes (Signed)
[facility] EMERGENCY DEPARTMENT Provider Note   CSN: [id number] Arrival date & time: 12/26/18  1345     History   Chief Complaint Chief Complaint  Patient presents with  . Neck Pain  . Dizziness    HPI Claudia Perez is a 68 y.o. female.  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  She is a difficult historian and her husband at bedside is having trouble contributing as well.  She complains of pain to the left side of her neck as well as her head.  She has a history of frequent falls due to her gait imbalance and normally walks with a walker.  She is fallen on a carpeted surface multiple times.  Complains of pain in the left side of her neck with increased and dizziness. States her neck is not painful to palpation but hurts only when she walked around.  She denies any vertigo or spinning sensation.  She denies any chest pain or shortness of breath.  No focal weakness, numbness or tingling.  No difficulty breathing or difficulty swallowing.  She does not take any blood thinners.  The history is provided by the patient.  Neck Pain  Associated symptoms: headaches and weakness   Associated symptoms: no chest pain and no fever   Dizziness  Associated symptoms: headaches and weakness   Associated symptoms: no chest pain, no nausea, no shortness of breath and no vomiting     Past Medical History:  Diagnosis Date  . Abnormality of gait   . Fall at home 03/27/2013   "lost consciousness; fractured right clavicle" (03/27/2013)  . GERD (gastroesophageal reflux disease)   . Hypercholesteremia   . Hyperthyroidism    "a little bit; don't take RX for it" (03/27/2013)    . Mobility impaired   . Other closed skull fracture without mention of intracranial injury, unspecified state of consciousness   . Seizures ([place])    "think I might have; been blacking out several times in the past" (03/27/2013)  . Shortness of breath    "even now, just talking" (03/27/2013)  . Subdural hemorrhage ([place])   . Vitamin D deficiency     Patient Active Problem List   Diagnosis Date Noted  . Goals of care, counseling/discussion 09/19/2018  . Primary adenocarcinoma of left lung ([place]) 09/19/2018  . Encounter for antineoplastic chemotherapy 09/19/2018  . Overactive bladder 08/18/2017  . Faintness   . Left rotator cuff tear 02/01/2015  . Closed left humeral fracture 12/07/2014  . Fatigue 12/07/2014  . 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 Dystonia 08/10/2014  . Syncope and collapse 06/08/2014  . Orthostatic hypotension 06/08/2014  . 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 Fall down stairs 12/11/2013  . Basilar skull fracture ([place]) 12/11/2013  . Traumatic intracranial hemorrhage ([place]) 12/11/2013  . Back pain 12/11/2013  . Subarachnoid hemorrhage following injury ([place]) 12/09/2013  . Syncope 03/27/2013  . Clavicular fracture 03/27/2013  . COUGH 12/28/2007    Past Surgical History:  Procedure Laterality Date  . APPENDECTOMY  1960's  . BRAIN SURGERY  10/2003  SDH evac  . EYE SURGERY  02/2013   cataract extraction w/ intraocular lens implant     OB History   No obstetric history on file.      Home Medications    Prior to Admission medications   Medication Sig Start Date End Date Taking? Authorizing Provider  acetaminophen (TYLENOL) 500 MG tablet Take 1,000 mg by mouth daily.     [provider]  magic mouthwash w/lidocaine SOLN Take 5 mLs by mouth 3 (three) times daily as needed for mouth  pain. 11/18/18   Harle Stanford., PA-C  Multiple Vitamin (MULTIVITAMIN) capsule Take 1 capsule by mouth daily.    [provider]  oxyCODONE-acetaminophen (PERCOCET/ROXICET) 5-325 MG tablet Take 1 tablet by mouth every 8 (eight) hours as needed for severe pain. Patient not taking: Reported on 09/19/2018 08/30/18   Curt Bears, MD  TAGRISSO 80 MG tablet TAKE 1 TABLET (80 MG TOTAL) BY MOUTH DAILY. 11/18/18   Curt Bears, MD  Vitamin D, Ergocalciferol, 2000 units CAPS Take 1 capsule by mouth daily.  03/27/14   [provider]    Family History Family History  Problem Relation Age of Onset  . Hypertension Mother   . Cirrhosis Brother     Social History Social History   Tobacco Use  . Smoking status: Never Smoker  . Smokeless tobacco: Never Used  Substance Use Topics  . Alcohol use: Yes    Comment: 03/27/2013 glass of wine "couple times/yr"  . Drug use: No     Allergies   Nitrofurantoin; Phenazopyridine; Sulfamethoxazole-trimethoprim; and Sulfamethoxazole   Review of Systems Review of Systems  Constitutional: Negative for activity change, appetite change and fever.  HENT: Negative for congestion and rhinorrhea.   Eyes: Negative for visual disturbance.  Respiratory: Negative for cough, chest tightness and shortness of breath.   Cardiovascular: Negative for chest pain.  Gastrointestinal: Negative for abdominal pain, nausea and vomiting.  Genitourinary: Negative for dysuria and hematuria.  Musculoskeletal: Positive for arthralgias, myalgias and neck pain.  Skin: Negative for rash.  Neurological: Positive for dizziness, speech difficulty, weakness, light-headedness and headaches.    all other systems are negative except as noted in the HPI and PMH.    Physical Exam Updated Vital Signs BP 113/80 (BP Location: Left Arm)   Pulse 73   Temp 98.3 F (36.8 C) (Oral)   Resp 17   Ht 5\' 1"  (1.549 m)   Wt 45.8 kg   SpO2 99%   BMI 19.08 kg/m   Physical  Exam Vitals signs and nursing note reviewed.  Constitutional:      General: She is not in acute distress.    Appearance: She is well-developed.     Comments: choreaform movements. Difficulty with speech.  HENT:     Head: Normocephalic and atraumatic.     Mouth/Throat:     Pharynx: No oropharyngeal exudate.  Eyes:     Conjunctiva/sclera: Conjunctivae normal.     Pupils: Pupils are equal, round, and reactive to light.  Neck:     Musculoskeletal: Normal range of motion and neck supple.     Comments: No meningismus. Cardiovascular:     Rate and Rhythm: Normal rate and regular rhythm.     Heart sounds: Normal heart sounds. No murmur.  Pulmonary:     Effort: Pulmonary effort is normal. No respiratory distress.     Breath sounds: Normal breath sounds.  Abdominal:     Palpations: Abdomen is soft.     Tenderness: There  is no abdominal tenderness. There is no guarding or rebound.  Musculoskeletal: Normal range of motion.        General: Deformity present. No tenderness.     Comments: Deformity to left shoulder which is chronic by patient's report.  She cannot raise her left arm due to this.  Skin:    General: Skin is warm.     Capillary Refill: Capillary refill takes less than 2 seconds.     Findings: No bruising.  Neurological:     General: No focal deficit present.     Mental Status: She is alert and oriented to person, place, and time.     Cranial Nerves: No cranial nerve deficit.     Motor: No abnormal muscle tone.     Coordination: Coordination normal.     Comments: Patient with choreaform movements of her trunk and face.  Her speech is difficult to understand but appears to be baseline per family in the room.  Cranial nerves II to XII intact, no facial asymmetry, tongue is midline.  5/5 strength of her upper and lower extremities.  No pronator drift.  No ataxia in finger-to-nose with right arm.  Unable to do this on the left due to her shoulder injury.  Ataxia in all extremities  and in trunk. Choreaform movements most prominently of the trunk   Psychiatric:        Behavior: Behavior normal.      ED Treatments / Results  Labs (all labs ordered are listed, but only abnormal results are displayed) Labs Reviewed  BASIC METABOLIC PANEL - Abnormal; Notable for the following components:      Result Value   Glucose, Bld 125 (*)    All other components within normal limits  CBC  I-STAT TROPONIN, ED  I-STAT TROPONIN, ED    EKG EKG Interpretation  Date/Time:  Monday December 26 2018 13:53:35 EST Ventricular Rate:  80 PR Interval:  98 QRS Duration: 62 QT Interval:  402 QTC Calculation: 463 R Axis:   14 Text Interpretation:  Sinus rhythm with short PR Nonspecific ST and T wave abnormality Abnormal ECG No significant change was found Confirmed by Ezequiel Essex [phone]) on 12/27/2018 1:06:23 AM   Radiology Ct Angio Head W Or Wo Contrast  Result Date: 12/27/2018 CLINICAL DATA:  Peripheral episodic vertigo. EXAM: CT ANGIOGRAPHY HEAD AND NECK TECHNIQUE: Multidetector CT imaging of the head and neck was performed using the standard protocol during bolus administration of intravenous contrast. Multiplanar CT image reconstructions and MIPs were obtained to evaluate the vascular anatomy. Carotid stenosis measurements (when applicable) are obtained utilizing NASCET criteria, using the distal internal carotid diameter as the denominator. CONTRAST:  38mL ISOVUE-370 IOPAMIDOL (ISOVUE-370) INJECTION 76% COMPARISON:  Head CT 07/29/2018 FINDINGS: CT HEAD FINDINGS Brain: There is no mass, hemorrhage or extra-axial collection. There is encephalomalacia of both frontal lobes and the posterior left parietal lobe. There is right greater than left anterior temporal lobe encephalomalacia. There is hypoattenuation of the periventricular white matter, most commonly indicating chronic ischemic microangiopathy. Skull: Remote left parietal craniectomy. Sinuses/Orbits: No fluid levels or advanced  mucosal thickening of the visualized paranasal sinuses. No mastoid or middle ear effusion. The orbits are normal. CTA NECK FINDINGS SKELETON: Please see dedicated report for concomitant CT of the cervical spine. OTHER NECK: Normal pharynx, larynx and major salivary glands. No cervical lymphadenopathy. Unremarkable thyroid gland. UPPER CHEST: No pneumothorax or pleural effusion. No nodules or masses. AORTIC ARCH: There is no calcific atherosclerosis of the aortic arch.  There is no aneurysm, dissection or hemodynamically significant stenosis of the visualized ascending aorta and aortic arch. Conventional 3 vessel aortic branching pattern. The visualized proximal subclavian arteries are widely patent. RIGHT CAROTID SYSTEM: --Common carotid artery: Widely patent origin without common carotid artery dissection or aneurysm. --Internal carotid artery: Normal without aneurysm, dissection or stenosis. --External carotid artery: No acute abnormality. LEFT CAROTID SYSTEM: --Common carotid artery: Widely patent origin without common carotid artery dissection or aneurysm. --Internal carotid artery: Normal without aneurysm, dissection or stenosis. --External carotid artery: No acute abnormality. VERTEBRAL ARTERIES: Codominant configuration. Both origins are normal. No dissection, occlusion or flow-limiting stenosis to the vertebrobasilar confluence. CTA HEAD FINDINGS POSTERIOR CIRCULATION: --Basilar artery: Normal. --Posterior cerebral arteries: Normal. Both originate from the basilar artery. --Superior cerebellar arteries: Normal. --Inferior cerebellar arteries: Normal anterior and posterior inferior cerebellar arteries. ANTERIOR CIRCULATION: --Intracranial internal carotid arteries: Normal. --Anterior cerebral arteries: Normal. Both A1 segments are present. Patent anterior communicating artery. --Middle cerebral arteries: Normal. --Posterior communicating arteries: Absent bilaterally. VENOUS SINUSES: As permitted by contrast  timing, patent. ANATOMIC VARIANTS: None DELAYED PHASE: No parenchymal contrast enhancement. Review of the MIP images confirms the above findings. IMPRESSION: 1. No emergent large vessel occlusion or high-grade stenosis. 2. Normal CTA of the head and neck. 3. Multifocal encephalomalacia without acute intracranial abnormality. Electronically Signed   By: Ulyses Jarred M.D.   On: 12/27/2018 02:56   Dg Chest 2 View  Result Date: 12/26/2018 CLINICAL DATA:  Neck pain and dizziness. EXAM: CHEST - 2 VIEW COMPARISON:  09/07/2018 and 12/27/2013 FINDINGS: The heart size and pulmonary vascularity are normal. Aortic atherosclerosis. Lungs are clear. Old nonunion fracture of the proximal left humeral shaft. No acute bone abnormality. IMPRESSION: No acute abnormalities. Aortic Atherosclerosis (ICD10-I70.0). Electronically Signed   By: Lorriane Shire M.D.   On: 12/26/2018 15:16   Ct Angio Neck W And/or Wo Contrast  Result Date: 12/27/2018 CLINICAL DATA:  Peripheral episodic vertigo. EXAM: CT ANGIOGRAPHY HEAD AND NECK TECHNIQUE: Multidetector CT imaging of the head and neck was performed using the standard protocol during bolus administration of intravenous contrast. Multiplanar CT image reconstructions and MIPs were obtained to evaluate the vascular anatomy. Carotid stenosis measurements (when applicable) are obtained utilizing NASCET criteria, using the distal internal carotid diameter as the denominator. CONTRAST:  65mL ISOVUE-370 IOPAMIDOL (ISOVUE-370) INJECTION 76% COMPARISON:  Head CT 07/29/2018 FINDINGS: CT HEAD FINDINGS Brain: There is no mass, hemorrhage or extra-axial collection. There is encephalomalacia of both frontal lobes and the posterior left parietal lobe. There is right greater than left anterior temporal lobe encephalomalacia. There is hypoattenuation of the periventricular white matter, most commonly indicating chronic ischemic microangiopathy. Skull: Remote left parietal craniectomy. Sinuses/Orbits: No  fluid levels or advanced mucosal thickening of the visualized paranasal sinuses. No mastoid or middle ear effusion. The orbits are normal. CTA NECK FINDINGS SKELETON: Please see dedicated report for concomitant CT of the cervical spine. OTHER NECK: Normal pharynx, larynx and major salivary glands. No cervical lymphadenopathy. Unremarkable thyroid gland. UPPER CHEST: No pneumothorax or pleural effusion. No nodules or masses. AORTIC ARCH: There is no calcific atherosclerosis of the aortic arch. There is no aneurysm, dissection or hemodynamically significant stenosis of the visualized ascending aorta and aortic arch. Conventional 3 vessel aortic branching pattern. The visualized proximal subclavian arteries are widely patent. RIGHT CAROTID SYSTEM: --Common carotid artery: Widely patent origin without common carotid artery dissection or aneurysm. --Internal carotid artery: Normal without aneurysm, dissection or stenosis. --External carotid artery: No acute abnormality. LEFT CAROTID SYSTEM: --  Common carotid artery: Widely patent origin without common carotid artery dissection or aneurysm. --Internal carotid artery: Normal without aneurysm, dissection or stenosis. --External carotid artery: No acute abnormality. VERTEBRAL ARTERIES: Codominant configuration. Both origins are normal. No dissection, occlusion or flow-limiting stenosis to the vertebrobasilar confluence. CTA HEAD FINDINGS POSTERIOR CIRCULATION: --Basilar artery: Normal. --Posterior cerebral arteries: Normal. Both originate from the basilar artery. --Superior cerebellar arteries: Normal. --Inferior cerebellar arteries: Normal anterior and posterior inferior cerebellar arteries. ANTERIOR CIRCULATION: --Intracranial internal carotid arteries: Normal. --Anterior cerebral arteries: Normal. Both A1 segments are present. Patent anterior communicating artery. --Middle cerebral arteries: Normal. --Posterior communicating arteries: Absent bilaterally. VENOUS SINUSES: As  permitted by contrast timing, patent. ANATOMIC VARIANTS: None DELAYED PHASE: No parenchymal contrast enhancement. Review of the MIP images confirms the above findings. IMPRESSION: 1. No emergent large vessel occlusion or high-grade stenosis. 2. Normal CTA of the head and neck. 3. Multifocal encephalomalacia without acute intracranial abnormality. Electronically Signed   By: Ulyses Jarred M.D.   On: 12/27/2018 02:56   Mr Jeri Cos And Wo Contrast  Result Date: 12/27/2018 CLINICAL DATA:  Central vertigo. EXAM: MRI HEAD WITHOUT AND WITH CONTRAST TECHNIQUE: Multiplanar, multiecho pulse sequences of the brain and surrounding structures were obtained without and with intravenous contrast. CONTRAST:  5 mL Gadavist COMPARISON:  None. FINDINGS: BRAIN: There is no acute infarct, acute hemorrhage, hydrocephalus or extra-axial collection. Partially empty sella. No midline shift or other mass effect. Multifocal encephalomalacia of the frontal lobes, temporal lobes and left parietal lobe. Multifocal periventricular white matter hyperintensity, most often a result of chronic microvascular ischemia. Advanced atrophy for age. Susceptibility-sensitive sequences show no chronic microhemorrhage or superficial siderosis. No abnormal contrast enhancement. VASCULAR: Major intracranial arterial and venous sinus flow voids are normal. SKULL AND UPPER CERVICAL SPINE: Remote left parietal craniectomy. SINUSES/ORBITS: No fluid levels or advanced mucosal thickening. No mastoid or middle ear effusion. The orbits are normal. IMPRESSION: 1. No acute intracranial abnormality. 2. Severe multifocal encephalomalacia, unchanged. Electronically Signed   By: Ulyses Jarred M.D.   On: 12/27/2018 05:03   Ct C-spine No Charge  Result Date: 12/27/2018 CLINICAL DATA:  Neck pain with dizziness EXAM: CT CERVICAL SPINE WITHOUT CONTRAST TECHNIQUE: Multidetector CT imaging of the cervical spine was performed without intravenous contrast. Multiplanar CT image  reconstructions were also generated. COMPARISON:  07/29/2018 FINDINGS: Alignment: No static subluxation. Facets are aligned. Occipital condyles and the lateral masses of C1 and C2 are normally approximated. Skull base and vertebrae: No acute fracture. Soft tissues and spinal canal: No prevertebral fluid or swelling. No visible canal hematoma. Disc levels: No advanced spinal canal or neural foraminal stenosis. Upper chest: Nodularity at the left lung apex has decreased from the prior study. Other: Normal visualized paraspinal cervical soft tissues. IMPRESSION: 1. No acute fracture or static subluxation of the cervical spine. 2. Nodularity of the left lung apex, decreased compared to 07/29/2018. Electronically Signed   By: Ulyses Jarred M.D.   On: 12/27/2018 03:00   Dg Shoulder Left  Result Date: 12/27/2018 CLINICAL DATA:  Bilateral neck pain and dizziness worse this morning. Bilateral shoulder pain. EXAM: LEFT SHOULDER - 2+ VIEW COMPARISON:  10/19/2014 FINDINGS: Remote ununited left humeral neck fracture with anterior displacement of the humeral shaft nearly 1 shaft width relative to the humeral head. Osteopenic appearance of the left shoulder. The [facility] joint is maintained. No new fracture or bone destruction is seen. The adjacent ribs and are nonacute IMPRESSION: Chronic ununited displaced humeral neck fracture with anterior displacement of the  humeral shaft relative to the head. No osseous appearing abnormality. Electronically Signed   By: Ashley Royalty M.D.   On: 12/27/2018 01:58   Dg Humerus Left  Result Date: 12/27/2018 CLINICAL DATA:  Bilateral shoulder pain greater than right. History of prior shoulder fracture. EXAM: LEFT HUMERUS - 2+ VIEW COMPARISON:  10/19/2014 FINDINGS: Remote ununited left humeral neck fracture with anterior displacement of the humeral shaft nearly 1 shaft width relative to the humeral head. Osteopenic appearance of the left shoulder. The [facility] joint is maintained. No new fracture or  bone destruction is seen. The adjacent ribs and are nonacute. The included elbow joint is maintained. No significant soft tissue abnormalities. IMPRESSION: Remote ununited left humeral neck fracture. No acute osseous abnormality. Electronically Signed   By: Ashley Royalty M.D.   On: 12/27/2018 01:59    Procedures Procedures (including critical care time)  Medications Ordered in ED Medications  sodium chloride flush (NS) 0.9 % injection 3 mL (has no administration in time range)     Initial Impression / Assessment and Plan / ED Course  I have reviewed the triage vital signs and the nursing notes.  Pertinent labs & imaging results that were available during my care of the patient were reviewed by me and considered in my medical decision making (see chart for details).    Patient with history of chorea and neurodegenerative disorder and newly diagnosed lung cancer presenting with head and neck pain and increased falls.  She was recently seen by neurology on 12/20/2018.  They did not think she needed further work-up for stroke at that time.  Patient is difficult historian.  She describes head and neck pain after multiple recurrent falls at home.  CT head and C-spine are negative.  CTA was done and shows no large vessel occlusion.  EKG unchanged. Troponin negative x2. 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 MRI shows no new stroke.  No evidence of large vessel occlusion. No evidence of metastatic disease on MRI.  No evidence of traumatic injury from her recent falls.  Patient has a wide-based gait at baseline walks with a walker per her family in the room.  Her mental status is at baseline. Family is comfortable taking her home. Her frequent falls appear secondary to her progressive neurological process. Follow up with her PCP and primary neurologist.  Return precautions discussed.  Final Clinical Impressions(s) / ED Diagnoses   Final diagnoses:  Dizziness  Frequent falls   Neck pain    ED Discharge Orders    None       Ling Flesch, Annie Main, MD 12/28/18 [phone]

## 2018-12-28 DIAGNOSIS — R4189 Other symptoms and signs involving cognitive functions and awareness: Secondary | ICD-10-CM | POA: Diagnosis not present

## 2018-12-28 DIAGNOSIS — R260 Ataxic gait: Secondary | ICD-10-CM | POA: Diagnosis not present

## 2018-12-28 DIAGNOSIS — M6281 Muscle weakness (generalized): Secondary | ICD-10-CM | POA: Diagnosis not present

## 2018-12-28 DIAGNOSIS — M4802 Spinal stenosis, cervical region: Secondary | ICD-10-CM | POA: Diagnosis not present

## 2018-12-28 DIAGNOSIS — G249 Dystonia, unspecified: Secondary | ICD-10-CM | POA: Diagnosis not present

## 2018-12-28 DIAGNOSIS — G255 Other chorea: Secondary | ICD-10-CM | POA: Diagnosis not present

## 2018-12-30 DIAGNOSIS — G249 Dystonia, unspecified: Secondary | ICD-10-CM | POA: Diagnosis not present

## 2018-12-30 DIAGNOSIS — R260 Ataxic gait: Secondary | ICD-10-CM | POA: Diagnosis not present

## 2018-12-30 DIAGNOSIS — M6281 Muscle weakness (generalized): Secondary | ICD-10-CM | POA: Diagnosis not present

## 2018-12-30 DIAGNOSIS — R4189 Other symptoms and signs involving cognitive functions and awareness: Secondary | ICD-10-CM | POA: Diagnosis not present

## 2018-12-30 DIAGNOSIS — M4802 Spinal stenosis, cervical region: Secondary | ICD-10-CM | POA: Diagnosis not present

## 2018-12-30 DIAGNOSIS — G255 Other chorea: Secondary | ICD-10-CM | POA: Diagnosis not present

## 2019-01-03 DIAGNOSIS — M6281 Muscle weakness (generalized): Secondary | ICD-10-CM | POA: Diagnosis not present

## 2019-01-03 DIAGNOSIS — G255 Other chorea: Secondary | ICD-10-CM | POA: Diagnosis not present

## 2019-01-03 DIAGNOSIS — G249 Dystonia, unspecified: Secondary | ICD-10-CM | POA: Diagnosis not present

## 2019-01-03 DIAGNOSIS — M4802 Spinal stenosis, cervical region: Secondary | ICD-10-CM | POA: Diagnosis not present

## 2019-01-03 DIAGNOSIS — R260 Ataxic gait: Secondary | ICD-10-CM | POA: Diagnosis not present

## 2019-01-03 DIAGNOSIS — R4189 Other symptoms and signs involving cognitive functions and awareness: Secondary | ICD-10-CM | POA: Diagnosis not present

## 2019-01-06 DIAGNOSIS — G249 Dystonia, unspecified: Secondary | ICD-10-CM | POA: Diagnosis not present

## 2019-01-06 DIAGNOSIS — R260 Ataxic gait: Secondary | ICD-10-CM | POA: Diagnosis not present

## 2019-01-06 DIAGNOSIS — R4189 Other symptoms and signs involving cognitive functions and awareness: Secondary | ICD-10-CM | POA: Diagnosis not present

## 2019-01-06 DIAGNOSIS — M4802 Spinal stenosis, cervical region: Secondary | ICD-10-CM | POA: Diagnosis not present

## 2019-01-06 DIAGNOSIS — M6281 Muscle weakness (generalized): Secondary | ICD-10-CM | POA: Diagnosis not present

## 2019-01-06 DIAGNOSIS — G255 Other chorea: Secondary | ICD-10-CM | POA: Diagnosis not present

## 2019-01-09 ENCOUNTER — Ambulatory Visit (HOSPITAL_COMMUNITY)
Admission: RE | Admit: 2019-01-09 | Discharge: 2019-01-09 | Disposition: A | Discharge status: Home / Self Care | Payer: Medicare Other | Source: Ambulatory Visit | Attending: Oncology | Admitting: Oncology

## 2019-01-09 ENCOUNTER — Inpatient Hospital Stay: Payer: Medicare Other | Attending: Internal Medicine

## 2019-01-09 ENCOUNTER — Encounter: Payer: Self-pay | Admitting: *Deleted

## 2019-01-09 DIAGNOSIS — C3490 Malignant neoplasm of unspecified part of unspecified bronchus or lung: Secondary | ICD-10-CM | POA: Diagnosis not present

## 2019-01-09 DIAGNOSIS — C3492 Malignant neoplasm of unspecified part of left bronchus or lung: Secondary | ICD-10-CM | POA: Insufficient documentation

## 2019-01-09 DIAGNOSIS — C7951 Secondary malignant neoplasm of bone: Secondary | ICD-10-CM | POA: Diagnosis not present

## 2019-01-09 DIAGNOSIS — C3412 Malignant neoplasm of upper lobe, left bronchus or lung: Secondary | ICD-10-CM | POA: Insufficient documentation

## 2019-01-09 LAB — CBC WITH DIFFERENTIAL (CANCER CENTER ONLY)
Abs Immature Granulocytes: 0.01 10*3/uL (ref 0.00–0.07)
BASOS PCT: 1 %
Basophils Absolute: 0 10*3/uL (ref 0.0–0.1)
Eosinophils Absolute: 0.1 10*3/uL (ref 0.0–0.5)
Eosinophils Relative: 1 %
HCT: 39.6 % (ref 36.0–46.0)
Hemoglobin: 12.7 g/dL (ref 12.0–15.0)
Immature Granulocytes: 0 %
Lymphocytes Relative: 31 %
Lymphs Abs: 1.6 10*3/uL (ref 0.7–4.0)
MCH: 32.2 pg (ref 26.0–34.0)
MCHC: 32.1 g/dL (ref 30.0–36.0)
MCV: 100.5 fL — ABNORMAL HIGH (ref 80.0–100.0)
MONOS PCT: 12 %
Monocytes Absolute: 0.6 10*3/uL (ref 0.1–1.0)
Neutro Abs: 2.9 10*3/uL (ref 1.7–7.7)
Neutrophils Relative %: 55 %
Platelet Count: 240 10*3/uL (ref 150–400)
RBC: 3.94 MIL/uL (ref 3.87–5.11)
RDW: 13.2 % (ref 11.5–15.5)
WBC Count: 5.1 10*3/uL (ref 4.0–10.5)
nRBC: 0 % (ref 0.0–0.2)

## 2019-01-09 LAB — CMP (CANCER CENTER ONLY)
ALT: 12 U/L (ref 0–44)
AST: 20 U/L (ref 15–41)
Albumin: 4 g/dL (ref 3.5–5.0)
Alkaline Phosphatase: 98 U/L (ref 38–126)
Anion gap: 10 (ref 5–15)
BUN: 10 mg/dL (ref 8–23)
CO2: 26 mmol/L (ref 22–32)
Calcium: 9.5 mg/dL (ref 8.9–10.3)
Chloride: 105 mmol/L (ref 98–111)
Creatinine: 0.73 mg/dL (ref 0.44–1.00)
GFR, Est AFR Am: >60 mL/min (ref >60)
GFR, Estimated: >60 mL/min (ref >60)
Glucose, Bld: 93 mg/dL (ref 70–99)
POTASSIUM: 4.1 mmol/L (ref 3.5–5.1)
Sodium: 141 mmol/L (ref 135–145)
TOTAL PROTEIN: 6.9 g/dL (ref 6.5–8.1)
Total Bilirubin: 0.7 mg/dL (ref 0.3–1.2)

## 2019-01-09 LAB — RESEARCH LABS

## 2019-01-09 MED ORDER — IOHEXOL 300 MG/ML  SOLN
100.0000 mL | Freq: Once | INTRAMUSCULAR | Status: AC | PRN
  Administered 2019-01-09: 80 mL via INTRAVENOUS

## 2019-01-09 MED ORDER — SODIUM CHLORIDE (PF) 0.9 % IJ SOLN
INTRAMUSCULAR | Status: AC
  Filled 2019-01-09: qty 50

## 2019-01-09 NOTE — Progress Notes (Signed)
Patient in clinic today,labs drawn per [id number] protocol. Patient was given a $50 debit card for participating in the research study. Patient was thanked for her time and contribution  to the study.

## 2019-01-10 DIAGNOSIS — R2681 Unsteadiness on feet: Secondary | ICD-10-CM | POA: Diagnosis not present

## 2019-01-10 DIAGNOSIS — C3490 Malignant neoplasm of unspecified part of unspecified bronchus or lung: Secondary | ICD-10-CM | POA: Diagnosis not present

## 2019-01-10 DIAGNOSIS — R296 Repeated falls: Secondary | ICD-10-CM | POA: Diagnosis not present

## 2019-01-10 DIAGNOSIS — G259 Extrapyramidal and movement disorder, unspecified: Secondary | ICD-10-CM | POA: Diagnosis not present

## 2019-01-10 DIAGNOSIS — R479 Unspecified speech disturbances: Secondary | ICD-10-CM | POA: Diagnosis not present

## 2019-01-10 DIAGNOSIS — Z8679 Personal history of other diseases of the circulatory system: Secondary | ICD-10-CM | POA: Diagnosis not present

## 2019-01-16 ENCOUNTER — Inpatient Hospital Stay (HOSPITAL_BASED_OUTPATIENT_CLINIC_OR_DEPARTMENT_OTHER): Payer: Medicare Other | Admitting: Internal Medicine

## 2019-01-16 ENCOUNTER — Telehealth: Payer: Self-pay

## 2019-01-16 VITALS — BP 110/40 | HR 74 | Temp 98.6°F | Resp 18 | Ht 61.0 in

## 2019-01-16 DIAGNOSIS — R5383 Other fatigue: Secondary | ICD-10-CM

## 2019-01-16 DIAGNOSIS — C3412 Malignant neoplasm of upper lobe, left bronchus or lung: Secondary | ICD-10-CM | POA: Diagnosis not present

## 2019-01-16 DIAGNOSIS — Z5111 Encounter for antineoplastic chemotherapy: Secondary | ICD-10-CM

## 2019-01-16 DIAGNOSIS — C7951 Secondary malignant neoplasm of bone: Secondary | ICD-10-CM

## 2019-01-16 DIAGNOSIS — C3492 Malignant neoplasm of unspecified part of left bronchus or lung: Secondary | ICD-10-CM

## 2019-01-16 NOTE — Telephone Encounter (Signed)
Printed avs and calender of upcoming appointment. Per 2/17 los 

## 2019-01-16 NOTE — Progress Notes (Signed)
[facility] [phone]   Fax:([phone]) [place], [place] [address]  DIAGNOSIS: Stage IV (T2 a, N2, M1c)non-small cell carcinoma, adenocarcinoma, presented with left upper lobe lung mass in addition to multiple pleural-based nodules as well as suspicious bone metastasis to the left humeral neck, left ninth and 10th ribs as well as right femoral neck.  Biomarker Findings Tumor Mutational Burden - TMB-Intermediate (16 Muts/Mb) Microsatellite status - MS-Stable Genomic Findings For a complete list of the genes assayed, please refer to the Appendix. EGFR exon 19 deletion ([id number]), amplification MDM2 amplification EPHA3 V206L RB1 loss exons 18-27 TP53 R273H, R280K 7 Disease relevant genes with no reportable alterations: KRAS, ALK, BRAF, MET, RET, ERBB2, ROS1  PDL 1 expression is 50%.  PRIOR THERAPY: None  CURRENT THERAPY: Tagrisso 80 mg p.o. daily first dose October 05, 2018.  Status post 3 months of treatment.  INTERVAL HISTORY: Claudia Perez 68 y.o. female returns to the clinic today for follow-up visit accompanied by her husband.  The patient is feeling fine today with no concerning complaints.  She continues to tolerate her treatment with Tagrisso fairly well except for some oral sores.  She denied having any current chest pain, shortness of breath, cough or hemoptysis.  She denied having any fever or chills.  She has no nausea, vomiting, diarrhea or constipation.  She denied having any headache or visual changes.  She had repeat CT scan of the chest, abdomen and pelvis performed recently and she is here for evaluation and discussion of her scan results.   MEDICAL HISTORY: Past Medical History:  Diagnosis Date  . Abnormality of gait   . Fall at home 03/27/2013   "lost consciousness; fractured right clavicle" (03/27/2013)  . GERD (gastroesophageal reflux disease)   .  Hypercholesteremia   . Hyperthyroidism    "a little bit; don't take RX for it" (03/27/2013)  . Mobility impaired   . Other closed skull fracture without mention of intracranial injury, unspecified state of consciousness   . Seizures ([place])    "think I might have; been blacking out several times in the past" (03/27/2013)  . Shortness of breath    "even now, just talking" (03/27/2013)  . Subdural hemorrhage ([place])   . Vitamin D deficiency     ALLERGIES:  is allergic to nitrofurantoin; phenazopyridine; sulfamethoxazole-trimethoprim; and sulfamethoxazole.  MEDICATIONS:  Current Outpatient Medications  Medication Sig Dispense Refill  . acetaminophen (TYLENOL) 500 MG tablet Take 1,000 mg by mouth daily.     . Multiple Vitamin (MULTIVITAMIN) capsule Take 1 capsule by mouth daily.    Marland Kitchen TAGRISSO 80 MG tablet TAKE 1 TABLET (80 MG TOTAL) BY MOUTH DAILY. (Patient taking differently: Take 80 mg by mouth daily. ) 30 tablet 1  . Vitamin D, Ergocalciferol, 2000 units CAPS Take 1 capsule by mouth daily.     . magic mouthwash w/lidocaine SOLN Take 5 mLs by mouth 3 (three) times daily as needed for mouth pain. (Patient not taking: Reported on 12/27/2018) 120 mL 0  . oxyCODONE-acetaminophen (PERCOCET/ROXICET) 5-325 MG tablet Take 1 tablet by mouth every 8 (eight) hours as needed for severe pain. (Patient not taking: Reported on 01/16/2019) 30 tablet 0   No current facility-administered medications for this visit.     SURGICAL HISTORY:  Past Surgical History:  Procedure Laterality Date  . APPENDECTOMY  1960's  . BRAIN SURGERY  10/2003   SDH evac  .  EYE SURGERY  02/2013   cataract extraction w/ intraocular lens implant    REVIEW OF SYSTEMS:  Constitutional: positive for fatigue Eyes: negative Ears, nose, mouth, throat, and face: negative Respiratory: negative Cardiovascular: negative Gastrointestinal: negative Genitourinary:negative Integument/breast: negative Hematologic/lymphatic:  negative Musculoskeletal:negative Neurological: negative Behavioral/Psych: negative Endocrine: negative Allergic/Immunologic: negative   PHYSICAL EXAMINATION: General appearance: alert, cooperative, fatigued and no distress Head: Normocephalic, without obvious abnormality, atraumatic Neck: no adenopathy, no JVD, supple, symmetrical, trachea midline and thyroid not enlarged, symmetric, no tenderness/mass/nodules Lymph nodes: Cervical, supraclavicular, and axillary nodes normal. Resp: clear to auscultation bilaterally Back: symmetric, no curvature. ROM normal. No CVA tenderness. Cardio: regular rate and rhythm, S1, S2 normal, no murmur, click, rub or gallop GI: soft, non-tender; bowel sounds normal; no masses,  no organomegaly Extremities: extremities normal, atraumatic, no cyanosis or edema Neurologic: Alert and oriented X 3, normal strength and tone. Normal symmetric reflexes. Normal coordination and gait  ECOG PERFORMANCE STATUS: 1 - Symptomatic but completely ambulatory  Blood pressure (!) 110/40, pulse 74, temperature 98.6 F (37 C), temperature source Oral, resp. rate 18, height _0  (1.549 m), SpO2 100 %.  LABORATORY DATA: Lab Results  Component Value Date   WBC 5.1 01/09/2019   HGB 12.7 01/09/2019   HCT 39.6 01/09/2019   MCV 100.5 (H) 01/09/2019   PLT 240 01/09/2019      Chemistry      Component Value Date/Time   NA 141 01/09/2019 1005   K 4.1 01/09/2019 1005   CL 105 01/09/2019 1005   CO2 26 01/09/2019 1005   BUN 10 01/09/2019 1005   CREATININE 0.73 01/09/2019 1005   CREATININE 0.73 06/08/2014 1215      Component Value Date/Time   CALCIUM 9.5 01/09/2019 1005   ALKPHOS 98 01/09/2019 1005   AST 20 01/09/2019 1005   ALT 12 01/09/2019 1005   BILITOT 0.7 01/09/2019 1005       RADIOGRAPHIC STUDIES: Ct Angio Head W Or Wo Contrast  Result Date: 12/27/2018 CLINICAL DATA:  Peripheral episodic vertigo. EXAM: CT ANGIOGRAPHY HEAD AND NECK TECHNIQUE: Multidetector  CT imaging of the head and neck was performed using the standard protocol during bolus administration of intravenous contrast. Multiplanar CT image reconstructions and MIPs were obtained to evaluate the vascular anatomy. Carotid stenosis measurements (when applicable) are obtained utilizing NASCET criteria, using the distal internal carotid diameter as the denominator. CONTRAST:  61m ISOVUE-370 IOPAMIDOL (ISOVUE-370) INJECTION 76% COMPARISON:  Head CT 07/29/2018 FINDINGS: CT HEAD FINDINGS Brain: There is no mass, hemorrhage or extra-axial collection. There is encephalomalacia of both frontal lobes and the posterior left parietal lobe. There is right greater than left anterior temporal lobe encephalomalacia. There is hypoattenuation of the periventricular white matter, most commonly indicating chronic ischemic microangiopathy. Skull: Remote left parietal craniectomy. Sinuses/Orbits: No fluid levels or advanced mucosal thickening of the visualized paranasal sinuses. No mastoid or middle ear effusion. The orbits are normal. CTA NECK FINDINGS SKELETON: Please see dedicated report for concomitant CT of the cervical spine. OTHER NECK: Normal pharynx, larynx and major salivary glands. No cervical lymphadenopathy. Unremarkable thyroid gland. UPPER CHEST: No pneumothorax or pleural effusion. No nodules or masses. AORTIC ARCH: There is no calcific atherosclerosis of the aortic arch. There is no aneurysm, dissection or hemodynamically significant stenosis of the visualized ascending aorta and aortic arch. Conventional 3 vessel aortic branching pattern. The visualized proximal subclavian arteries are widely patent. RIGHT CAROTID SYSTEM: --Common carotid artery: Widely patent origin without common carotid artery dissection or aneurysm. --Internal  carotid artery: Normal without aneurysm, dissection or stenosis. --External carotid artery: No acute abnormality. LEFT CAROTID SYSTEM: --Common carotid artery: Widely patent origin  without common carotid artery dissection or aneurysm. --Internal carotid artery: Normal without aneurysm, dissection or stenosis. --External carotid artery: No acute abnormality. VERTEBRAL ARTERIES: Codominant configuration. Both origins are normal. No dissection, occlusion or flow-limiting stenosis to the vertebrobasilar confluence. CTA HEAD FINDINGS POSTERIOR CIRCULATION: --Basilar artery: Normal. --Posterior cerebral arteries: Normal. Both originate from the basilar artery. --Superior cerebellar arteries: Normal. --Inferior cerebellar arteries: Normal anterior and posterior inferior cerebellar arteries. ANTERIOR CIRCULATION: --Intracranial internal carotid arteries: Normal. --Anterior cerebral arteries: Normal. Both A1 segments are present. Patent anterior communicating artery. --Middle cerebral arteries: Normal. --Posterior communicating arteries: Absent bilaterally. VENOUS SINUSES: As permitted by contrast timing, patent. ANATOMIC VARIANTS: None DELAYED PHASE: No parenchymal contrast enhancement. Review of the MIP images confirms the above findings. IMPRESSION: 1. No emergent large vessel occlusion or high-grade stenosis. 2. Normal CTA of the head and neck. 3. Multifocal encephalomalacia without acute intracranial abnormality. Electronically Signed   By: Ulyses Jarred M.D.   On: 12/27/2018 02:56   Dg Chest 2 View  Result Date: 12/26/2018 CLINICAL DATA:  Neck pain and dizziness. EXAM: CHEST - 2 VIEW COMPARISON:  09/07/2018 and 12/27/2013 FINDINGS: The heart size and pulmonary vascularity are normal. Aortic atherosclerosis. Lungs are clear. Old nonunion fracture of the proximal left humeral shaft. No acute bone abnormality. IMPRESSION: No acute abnormalities. Aortic Atherosclerosis (ICD10-I70.0). Electronically Signed   By: Lorriane Shire M.D.   On: 12/26/2018 15:16   Ct Angio Neck W And/or Wo Contrast  Result Date: 12/27/2018 CLINICAL DATA:  Peripheral episodic vertigo. EXAM: CT ANGIOGRAPHY HEAD AND NECK  TECHNIQUE: Multidetector CT imaging of the head and neck was performed using the standard protocol during bolus administration of intravenous contrast. Multiplanar CT image reconstructions and MIPs were obtained to evaluate the vascular anatomy. Carotid stenosis measurements (when applicable) are obtained utilizing NASCET criteria, using the distal internal carotid diameter as the denominator. CONTRAST:  82m ISOVUE-370 IOPAMIDOL (ISOVUE-370) INJECTION 76% COMPARISON:  Head CT 07/29/2018 FINDINGS: CT HEAD FINDINGS Brain: There is no mass, hemorrhage or extra-axial collection. There is encephalomalacia of both frontal lobes and the posterior left parietal lobe. There is right greater than left anterior temporal lobe encephalomalacia. There is hypoattenuation of the periventricular white matter, most commonly indicating chronic ischemic microangiopathy. Skull: Remote left parietal craniectomy. Sinuses/Orbits: No fluid levels or advanced mucosal thickening of the visualized paranasal sinuses. No mastoid or middle ear effusion. The orbits are normal. CTA NECK FINDINGS SKELETON: Please see dedicated report for concomitant CT of the cervical spine. OTHER NECK: Normal pharynx, larynx and major salivary glands. No cervical lymphadenopathy. Unremarkable thyroid gland. UPPER CHEST: No pneumothorax or pleural effusion. No nodules or masses. AORTIC ARCH: There is no calcific atherosclerosis of the aortic arch. There is no aneurysm, dissection or hemodynamically significant stenosis of the visualized ascending aorta and aortic arch. Conventional 3 vessel aortic branching pattern. The visualized proximal subclavian arteries are widely patent. RIGHT CAROTID SYSTEM: --Common carotid artery: Widely patent origin without common carotid artery dissection or aneurysm. --Internal carotid artery: Normal without aneurysm, dissection or stenosis. --External carotid artery: No acute abnormality. LEFT CAROTID SYSTEM: --Common carotid artery:  Widely patent origin without common carotid artery dissection or aneurysm. --Internal carotid artery: Normal without aneurysm, dissection or stenosis. --External carotid artery: No acute abnormality. VERTEBRAL ARTERIES: Codominant configuration. Both origins are normal. No dissection, occlusion or flow-limiting stenosis to the vertebrobasilar confluence. CTA  HEAD FINDINGS POSTERIOR CIRCULATION: --Basilar artery: Normal. --Posterior cerebral arteries: Normal. Both originate from the basilar artery. --Superior cerebellar arteries: Normal. --Inferior cerebellar arteries: Normal anterior and posterior inferior cerebellar arteries. ANTERIOR CIRCULATION: --Intracranial internal carotid arteries: Normal. --Anterior cerebral arteries: Normal. Both A1 segments are present. Patent anterior communicating artery. --Middle cerebral arteries: Normal. --Posterior communicating arteries: Absent bilaterally. VENOUS SINUSES: As permitted by contrast timing, patent. ANATOMIC VARIANTS: None DELAYED PHASE: No parenchymal contrast enhancement. Review of the MIP images confirms the above findings. IMPRESSION: 1. No emergent large vessel occlusion or high-grade stenosis. 2. Normal CTA of the head and neck. 3. Multifocal encephalomalacia without acute intracranial abnormality. Electronically Signed   By: Ulyses Jarred M.D.   On: 12/27/2018 02:56   Ct Chest W Contrast  Result Date: 01/09/2019 CLINICAL DATA:  Stage IV non-small cell lung cancer. EXAM: CT CHEST, ABDOMEN, AND PELVIS WITH CONTRAST TECHNIQUE: Multidetector CT imaging of the chest, abdomen and pelvis was performed following the standard protocol during bolus administration of intravenous contrast. CONTRAST:  72m OMNIPAQUE IOHEXOL 300 MG/ML  SOLN COMPARISON:  07/29/2018 PET-CT 08/29/2018 FINDINGS: CT CHEST FINDINGS Cardiovascular: The heart size is normal. No substantial pericardial effusion. No thoracic aortic aneurysm. Mediastinum/Nodes: No mediastinal lymphadenopathy. There  is no hilar lymphadenopathy. The esophagus has normal imaging features. There is no axillary lymphadenopathy. Lungs/Pleura: The central tracheobronchial airways are patent. The 3 cm left upper lobe mass identified on prior imaging has decreased substantially in the interval measuring 0.9 x 0.8 cm today. The prominent pleural nodularity in the posterior left apex has also decreased substantially. 1 of the more dominant nodular components of was 12 mm on the prior study is now 8 mm. 2.9 cm left upper lobe pleural-based nodule measured previously has resolved completely in the interval. There is some minimal atelectasis in the dependent lower lobes bilaterally. Musculoskeletal: No worrisome lytic or sclerotic osseous abnormality. CT ABDOMEN PELVIS FINDINGS Hepatobiliary: No suspicious focal abnormality within the liver parenchyma. There is no evidence for gallstones, gallbladder wall thickening, or pericholecystic fluid. No intrahepatic or extrahepatic biliary dilation. Pancreas: No focal mass lesion. No dilatation of the main duct. No intraparenchymal cyst. No peripancreatic edema. Spleen: No splenomegaly. No focal mass lesion. Adrenals/Urinary Tract: No adrenal nodule or mass. Kidneys unremarkable. No evidence for hydroureter. The urinary bladder appears normal for the degree of distention. Stomach/Bowel: Stomach is unremarkable. No gastric wall thickening. No evidence of outlet obstruction. Duodenum is normally positioned as is the ligament of Treitz. No small bowel wall thickening. No small bowel dilatation. The terminal ileum is normal. The appendix is not visualized, but there is no edema or inflammation in the region of the cecum. No gross colonic mass. No colonic wall thickening. Vascular/Lymphatic: No abdominal aortic aneurysm. There is no gastrohepatic or hepatoduodenal ligament lymphadenopathy. No intraperitoneal or retroperitoneal lymphadenopathy. No pelvic sidewall lymphadenopathy. Reproductive: The uterus  is unremarkable.  There is no adnexal mass. Other: No intraperitoneal free fluid. Musculoskeletal: No worrisome lytic or sclerotic osseous abnormality. Superior endplate compression deformity at T11 and T12 is age indeterminate. Mild superior endplate compression is seen at T9. IMPRESSION: 1. Marked interval decrease in size of the left upper lobe pulmonary mass. The associated pleural-based disease in the left hemithorax has also decreased and in some areas resolved. 2. No evidence for metastatic disease in the abdomen/pelvis. 3. Mild superior endplate compression deformity at T9, T11, and T12, age indeterminate. Electronically Signed   By: EMisty StanleyM.D.   On: 01/09/2019 13:50   Mr Brain  W And Wo Contrast  Result Date: 12/27/2018 CLINICAL DATA:  Central vertigo. EXAM: MRI HEAD WITHOUT AND WITH CONTRAST TECHNIQUE: Multiplanar, multiecho pulse sequences of the brain and surrounding structures were obtained without and with intravenous contrast. CONTRAST:  5 mL Gadavist COMPARISON:  None. FINDINGS: BRAIN: There is no acute infarct, acute hemorrhage, hydrocephalus or extra-axial collection. Partially empty sella. No midline shift or other mass effect. Multifocal encephalomalacia of the frontal lobes, temporal lobes and left parietal lobe. Multifocal periventricular white matter hyperintensity, most often a result of chronic microvascular ischemia. Advanced atrophy for age. Susceptibility-sensitive sequences show no chronic microhemorrhage or superficial siderosis. No abnormal contrast enhancement. VASCULAR: Major intracranial arterial and venous sinus flow voids are normal. SKULL AND UPPER CERVICAL SPINE: Remote left parietal craniectomy. SINUSES/ORBITS: No fluid levels or advanced mucosal thickening. No mastoid or middle ear effusion. The orbits are normal. IMPRESSION: 1. No acute intracranial abnormality. 2. Severe multifocal encephalomalacia, unchanged. Electronically Signed   By: Ulyses Jarred M.D.   On:  12/27/2018 05:03   Ct Abdomen Pelvis W Contrast  Result Date: 01/09/2019 CLINICAL DATA:  Stage IV non-small cell lung cancer. EXAM: CT CHEST, ABDOMEN, AND PELVIS WITH CONTRAST TECHNIQUE: Multidetector CT imaging of the chest, abdomen and pelvis was performed following the standard protocol during bolus administration of intravenous contrast. CONTRAST:  32m OMNIPAQUE IOHEXOL 300 MG/ML  SOLN COMPARISON:  07/29/2018 PET-CT 08/29/2018 FINDINGS: CT CHEST FINDINGS Cardiovascular: The heart size is normal. No substantial pericardial effusion. No thoracic aortic aneurysm. Mediastinum/Nodes: No mediastinal lymphadenopathy. There is no hilar lymphadenopathy. The esophagus has normal imaging features. There is no axillary lymphadenopathy. Lungs/Pleura: The central tracheobronchial airways are patent. The 3 cm left upper lobe mass identified on prior imaging has decreased substantially in the interval measuring 0.9 x 0.8 cm today. The prominent pleural nodularity in the posterior left apex has also decreased substantially. 1 of the more dominant nodular components of was 12 mm on the prior study is now 8 mm. 2.9 cm left upper lobe pleural-based nodule measured previously has resolved completely in the interval. There is some minimal atelectasis in the dependent lower lobes bilaterally. Musculoskeletal: No worrisome lytic or sclerotic osseous abnormality. CT ABDOMEN PELVIS FINDINGS Hepatobiliary: No suspicious focal abnormality within the liver parenchyma. There is no evidence for gallstones, gallbladder wall thickening, or pericholecystic fluid. No intrahepatic or extrahepatic biliary dilation. Pancreas: No focal mass lesion. No dilatation of the main duct. No intraparenchymal cyst. No peripancreatic edema. Spleen: No splenomegaly. No focal mass lesion. Adrenals/Urinary Tract: No adrenal nodule or mass. Kidneys unremarkable. No evidence for hydroureter. The urinary bladder appears normal for the degree of distention.  Stomach/Bowel: Stomach is unremarkable. No gastric wall thickening. No evidence of outlet obstruction. Duodenum is normally positioned as is the ligament of Treitz. No small bowel wall thickening. No small bowel dilatation. The terminal ileum is normal. The appendix is not visualized, but there is no edema or inflammation in the region of the cecum. No gross colonic mass. No colonic wall thickening. Vascular/Lymphatic: No abdominal aortic aneurysm. There is no gastrohepatic or hepatoduodenal ligament lymphadenopathy. No intraperitoneal or retroperitoneal lymphadenopathy. No pelvic sidewall lymphadenopathy. Reproductive: The uterus is unremarkable.  There is no adnexal mass. Other: No intraperitoneal free fluid. Musculoskeletal: No worrisome lytic or sclerotic osseous abnormality. Superior endplate compression deformity at T11 and T12 is age indeterminate. Mild superior endplate compression is seen at T9. IMPRESSION: 1. Marked interval decrease in size of the left upper lobe pulmonary mass. The associated pleural-based disease in the  left hemithorax has also decreased and in some areas resolved. 2. No evidence for metastatic disease in the abdomen/pelvis. 3. Mild superior endplate compression deformity at T9, T11, and T12, age indeterminate. Electronically Signed   By: Misty Stanley M.D.   On: 01/09/2019 13:50   Ct C-spine No Charge  Result Date: 12/27/2018 CLINICAL DATA:  Neck pain with dizziness EXAM: CT CERVICAL SPINE WITHOUT CONTRAST TECHNIQUE: Multidetector CT imaging of the cervical spine was performed without intravenous contrast. Multiplanar CT image reconstructions were also generated. COMPARISON:  07/29/2018 FINDINGS: Alignment: No static subluxation. Facets are aligned. Occipital condyles and the lateral masses of C1 and C2 are normally approximated. Skull base and vertebrae: No acute fracture. Soft tissues and spinal canal: No prevertebral fluid or swelling. No visible canal hematoma. Disc levels: No  advanced spinal canal or neural foraminal stenosis. Upper chest: Nodularity at the left lung apex has decreased from the prior study. Other: Normal visualized paraspinal cervical soft tissues. IMPRESSION: 1. No acute fracture or static subluxation of the cervical spine. 2. Nodularity of the left lung apex, decreased compared to 07/29/2018. Electronically Signed   By: Ulyses Jarred M.D.   On: 12/27/2018 03:00   Dg Shoulder Left  Result Date: 12/27/2018 CLINICAL DATA:  Bilateral neck pain and dizziness worse this morning. Bilateral shoulder pain. EXAM: LEFT SHOULDER - 2+ VIEW COMPARISON:  10/19/2014 FINDINGS: Remote ununited left humeral neck fracture with anterior displacement of the humeral shaft nearly 1 shaft width relative to the humeral head. Osteopenic appearance of the left shoulder. The [facility] joint is maintained. No new fracture or bone destruction is seen. The adjacent ribs and are nonacute IMPRESSION: Chronic ununited displaced humeral neck fracture with anterior displacement of the humeral shaft relative to the head. No osseous appearing abnormality. Electronically Signed   By: Ashley Royalty M.D.   On: 12/27/2018 01:58   Dg Humerus Left  Result Date: 12/27/2018 CLINICAL DATA:  Bilateral shoulder pain greater than right. History of prior shoulder fracture. EXAM: LEFT HUMERUS - 2+ VIEW COMPARISON:  10/19/2014 FINDINGS: Remote ununited left humeral neck fracture with anterior displacement of the humeral shaft nearly 1 shaft width relative to the humeral head. Osteopenic appearance of the left shoulder. The [facility] joint is maintained. No new fracture or bone destruction is seen. The adjacent ribs and are nonacute. The included elbow joint is maintained. No significant soft tissue abnormalities. IMPRESSION: Remote ununited left humeral neck fracture. No acute osseous abnormality. Electronically Signed   By: Ashley Royalty M.D.   On: 12/27/2018 01:59    ASSESSMENT AND PLAN: This is a very pleasant 68 years old  African-American female diagnosed with a stage IV non-small cell lung cancer, adenocarcinoma with positive EGFR mutation with deletion in exon 19.  The patient was started on treatment with Tagrisso 80 mg p.o. daily status post 3 months of treatment. The patient continues to tolerate this treatment well with no concerning adverse effects. She had repeat CT scan of the chest, abdomen and pelvis performed recently.  I personally and independently reviewed the scan images and discussed the results with the patient and her husband. Her scan showed significant improvement of her disease with decrease in the size of several pulmonary nodules. I recommended for the patient to continue her current treatment with Tagrisso with the same dose. For the oral sores, she will use salt water as well as Biotene as needed. The patient will come back for follow-up visit in 1 months for evaluation and repeat blood work.  She was advised to call immediately if she has any concerning symptoms in the interval. The patient voices understanding of current disease status and treatment options and is in agreement with the current care plan. All questions were answered. The patient knows to call the clinic with any problems, questions or concerns. We can certainly see the patient much sooner if necessary.  Disclaimer: This note was dictated with voice recognition software. Similar sounding words can inadvertently be transcribed and may not be corrected upon review.

## 2019-02-13 ENCOUNTER — Other Ambulatory Visit: Payer: Self-pay

## 2019-02-13 ENCOUNTER — Inpatient Hospital Stay: Payer: Medicare Other | Attending: Internal Medicine

## 2019-02-13 DIAGNOSIS — C3492 Malignant neoplasm of unspecified part of left bronchus or lung: Secondary | ICD-10-CM

## 2019-02-13 DIAGNOSIS — R630 Anorexia: Secondary | ICD-10-CM | POA: Diagnosis not present

## 2019-02-13 DIAGNOSIS — C3412 Malignant neoplasm of upper lobe, left bronchus or lung: Secondary | ICD-10-CM | POA: Diagnosis not present

## 2019-02-13 DIAGNOSIS — C7951 Secondary malignant neoplasm of bone: Secondary | ICD-10-CM | POA: Insufficient documentation

## 2019-02-13 LAB — CMP (CANCER CENTER ONLY)
ALT: 11 U/L (ref 0–44)
ANION GAP: 11 (ref 5–15)
AST: 16 U/L (ref 15–41)
Albumin: 3.9 g/dL (ref 3.5–5.0)
Alkaline Phosphatase: 64 U/L (ref 38–126)
BUN: 16 mg/dL (ref 8–23)
CO2: 25 mmol/L (ref 22–32)
Calcium: 9.4 mg/dL (ref 8.9–10.3)
Chloride: 103 mmol/L (ref 98–111)
Creatinine: 0.77 mg/dL (ref 0.44–1.00)
GFR, Estimated: >60 mL/min (ref >60)
Glucose, Bld: 129 mg/dL — ABNORMAL HIGH (ref 70–99)
Potassium: 3.9 mmol/L (ref 3.5–5.1)
Sodium: 139 mmol/L (ref 135–145)
Total Bilirubin: 0.4 mg/dL (ref 0.3–1.2)
Total Protein: 7.1 g/dL (ref 6.5–8.1)

## 2019-02-13 LAB — CBC WITH DIFFERENTIAL (CANCER CENTER ONLY)
Abs Immature Granulocytes: 0.02 10*3/uL (ref 0.00–0.07)
Basophils Absolute: 0 10*3/uL (ref 0.0–0.1)
Basophils Relative: 0 %
Eosinophils Absolute: 0 10*3/uL (ref 0.0–0.5)
Eosinophils Relative: 0 %
HCT: 39.5 % (ref 36.0–46.0)
Hemoglobin: 12.4 g/dL (ref 12.0–15.0)
Immature Granulocytes: 0 %
Lymphocytes Relative: 29 %
Lymphs Abs: 1.7 10*3/uL (ref 0.7–4.0)
MCH: 32.6 pg (ref 26.0–34.0)
MCHC: 31.4 g/dL (ref 30.0–36.0)
MCV: 103.9 fL — ABNORMAL HIGH (ref 80.0–100.0)
Monocytes Absolute: 0.7 10*3/uL (ref 0.1–1.0)
Monocytes Relative: 11 %
Neutro Abs: 3.5 10*3/uL (ref 1.7–7.7)
Neutrophils Relative %: 60 %
Platelet Count: 230 10*3/uL (ref 150–400)
RBC: 3.8 MIL/uL — AB (ref 3.87–5.11)
RDW: 13.2 % (ref 11.5–15.5)
WBC Count: 5.9 10*3/uL (ref 4.0–10.5)
nRBC: 0 % (ref 0.0–0.2)

## 2019-02-15 ENCOUNTER — Encounter: Payer: Self-pay | Admitting: Internal Medicine

## 2019-02-15 ENCOUNTER — Other Ambulatory Visit: Payer: Self-pay | Admitting: Medical Oncology

## 2019-02-15 ENCOUNTER — Other Ambulatory Visit: Payer: Self-pay

## 2019-02-15 ENCOUNTER — Inpatient Hospital Stay (HOSPITAL_BASED_OUTPATIENT_CLINIC_OR_DEPARTMENT_OTHER): Payer: Medicare Other | Admitting: Internal Medicine

## 2019-02-15 VITALS — BP 101/63 | HR 80 | Temp 98.4°F | Resp 18 | Ht 61.0 in | Wt 94.2 lb

## 2019-02-15 DIAGNOSIS — R630 Anorexia: Secondary | ICD-10-CM | POA: Diagnosis not present

## 2019-02-15 DIAGNOSIS — C3492 Malignant neoplasm of unspecified part of left bronchus or lung: Secondary | ICD-10-CM

## 2019-02-15 DIAGNOSIS — C7951 Secondary malignant neoplasm of bone: Secondary | ICD-10-CM

## 2019-02-15 DIAGNOSIS — Z5111 Encounter for antineoplastic chemotherapy: Secondary | ICD-10-CM

## 2019-02-15 DIAGNOSIS — C3412 Malignant neoplasm of upper lobe, left bronchus or lung: Secondary | ICD-10-CM | POA: Diagnosis not present

## 2019-02-15 NOTE — Progress Notes (Signed)
[facility] [phone]   Fax:([phone]) [place], MD [place] [address]  DIAGNOSIS: Stage IV (T2 a, N2, M1c)non-small cell carcinoma, adenocarcinoma, presented with left upper lobe lung mass in addition to multiple pleural-based nodules as well as suspicious bone metastasis to the left humeral neck, left ninth and 10th ribs as well as right femoral neck.  Biomarker Findings Tumor Mutational Burden - TMB-Intermediate (16 Muts/Mb) Microsatellite status - MS-Stable Genomic Findings For a complete list of the genes assayed, please refer to the Appendix. EGFR exon 19 deletion ([id number]), amplification MDM2 amplification EPHA3 V206L RB1 loss exons 18-27 TP53 R273H, R280K 7 Disease relevant genes with no reportable alterations: KRAS, ALK, BRAF, MET, RET, ERBB2, ROS1  PDL 1 expression is 50%.  PRIOR THERAPY: None  CURRENT THERAPY: Tagrisso 80 mg p.o. daily first dose October 05, 2018.  Status post 4 months of treatment.  INTERVAL HISTORY: Claudia Perez 68 y.o. female returns to the clinic today for follow-up visit accompanied by her husband.  The patient is feeling fine today with no concerning complaints except for lack of appetite and she lost 4 pounds since her last visit.  She eats only twice a day.  She denied having any chest pain, shortness of breath, cough or hemoptysis.  She denied having any fever or chills.  She has no nausea, vomiting, diarrhea or constipation.  She denied having any headache or visual changes.  She is here today for evaluation and repeat blood work.   MEDICAL HISTORY: Past Medical History:  Diagnosis Date  . Abnormality of gait   . Fall at home 03/27/2013   "lost consciousness; fractured right clavicle" (03/27/2013)  . GERD (gastroesophageal reflux disease)   . Hypercholesteremia   . Hyperthyroidism    "a little bit; don't take RX for it" (03/27/2013)  .  Mobility impaired   . Other closed skull fracture without mention of intracranial injury, unspecified state of consciousness   . Seizures ([place])    "think I might have; been blacking out several times in the past" (03/27/2013)  . Shortness of breath    "even now, just talking" (03/27/2013)  . Subdural hemorrhage ([place])   . Vitamin D deficiency     ALLERGIES:  is allergic to nitrofurantoin; phenazopyridine; sulfamethoxazole-trimethoprim; and sulfamethoxazole.  MEDICATIONS:  Current Outpatient Medications  Medication Sig Dispense Refill  . acetaminophen (TYLENOL) 500 MG tablet Take 1,000 mg by mouth daily.     . magic mouthwash w/lidocaine SOLN Take 5 mLs by mouth 3 (three) times daily as needed for mouth pain. (Patient not taking: Reported on 12/27/2018) 120 mL 0  . Multiple Vitamin (MULTIVITAMIN) capsule Take 1 capsule by mouth daily.    Marland Kitchen oxyCODONE-acetaminophen (PERCOCET/ROXICET) 5-325 MG tablet Take 1 tablet by mouth every 8 (eight) hours as needed for severe pain. (Patient not taking: Reported on 01/16/2019) 30 tablet 0  . TAGRISSO 80 MG tablet TAKE 1 TABLET (80 MG TOTAL) BY MOUTH DAILY. (Patient taking differently: Take 80 mg by mouth daily. ) 30 tablet 1  . Vitamin D, Ergocalciferol, 2000 units CAPS Take 1 capsule by mouth daily.      No current facility-administered medications for this visit.     SURGICAL HISTORY:  Past Surgical History:  Procedure Laterality Date  . APPENDECTOMY  1960's  . BRAIN SURGERY  10/2003   SDH evac  . EYE SURGERY  02/2013   cataract extraction w/ intraocular lens  implant    REVIEW OF SYSTEMS:  A comprehensive review of systems was negative except for: Constitutional: positive for fatigue and weight loss   PHYSICAL EXAMINATION: General appearance: alert, cooperative, fatigued and no distress Head: Normocephalic, without obvious abnormality, atraumatic Neck: no adenopathy, no JVD, supple, symmetrical, trachea midline and thyroid not enlarged, symmetric,  no tenderness/mass/nodules Lymph nodes: Cervical, supraclavicular, and axillary nodes normal. Resp: clear to auscultation bilaterally Back: symmetric, no curvature. ROM normal. No CVA tenderness. Cardio: regular rate and rhythm, S1, S2 normal, no murmur, click, rub or gallop GI: soft, non-tender; bowel sounds normal; no masses,  no organomegaly Extremities: extremities normal, atraumatic, no cyanosis or edema  ECOG PERFORMANCE STATUS: 1 - Symptomatic but completely ambulatory  Blood pressure 101/63, pulse 80, temperature 98.4 F (36.9 C), temperature source Oral, resp. rate 18, height _0  (1.549 m), weight 94 lb 3.2 oz (42.7 kg), SpO2 100 %.  LABORATORY DATA: Lab Results  Component Value Date   WBC 5.9 02/13/2019   HGB 12.4 02/13/2019   HCT 39.5 02/13/2019   MCV 103.9 (H) 02/13/2019   PLT 230 02/13/2019      Chemistry      Component Value Date/Time   NA 139 02/13/2019 1136   K 3.9 02/13/2019 1136   CL 103 02/13/2019 1136   CO2 25 02/13/2019 1136   BUN 16 02/13/2019 1136   CREATININE 0.77 02/13/2019 1136   CREATININE 0.73 06/08/2014 1215      Component Value Date/Time   CALCIUM 9.4 02/13/2019 1136   ALKPHOS 64 02/13/2019 1136   AST 16 02/13/2019 1136   ALT 11 02/13/2019 1136   BILITOT 0.4 02/13/2019 1136       RADIOGRAPHIC STUDIES: No results found.  ASSESSMENT AND PLAN: This is a very pleasant 68 years old African-American female diagnosed with a stage IV non-small cell lung cancer, adenocarcinoma with positive EGFR mutation with deletion in exon 19.  The patient was started on treatment with Tagrisso 80 mg p.o. daily status post 4 months of treatment. The patient continues to tolerate this treatment well. I recommended for her to continue her same treatment with Tagrisso with the same dose. I will see her back for follow-up visit in 1 months for evaluation with repeat blood work. For the lack of appetite and malnutrition, will arrange for the patient to see the  dietitian at the cancer center for evaluation. The patient was advised to call immediately if she has any concerning symptoms in the interval. The patient voices understanding of current disease status and treatment options and is in agreement with the current care plan. All questions were answered. The patient knows to call the clinic with any problems, questions or concerns. We can certainly see the patient much sooner if necessary.  Disclaimer: This note was dictated with voice recognition software. Similar sounding words can inadvertently be transcribed and may not be corrected upon review.

## 2019-02-16 ENCOUNTER — Telehealth: Payer: Self-pay

## 2019-02-16 NOTE — Telephone Encounter (Signed)
Nutrition Assessment   Reason for Assessment:   Referral from Dr. Earlie Server.    ASSESSMENT:   68 year old female with stage IV non-small cell carcinoma of lung.  Patient receiving tagrisso.  Past medical history of GERD, hyerthyroidism, seizures, vit d, TBI.  Called patient via phone as noted nutrition appointment on 4/23, > 1 month out.  Patient difficult to understand over the phone.  Reports appetite is good.  Did say she typically was eating 2 meals per day but has increase as of yesterday to 3 meals per day.  Is not drinking oral nutrition supplements but has tried them before.    Nutrition Focused Physical Exam: deferred   Medications: MVI, tagrisso, Vit D   Labs: glucose 129  Anthropometrics:   Height: 61 inches Weight: 94 lb 3.2 oz Noted weight on 1/28 was ED visit 101 lb ?? Accuracy No weight taken on 2/17 during MD visit 1/13 visit with Dr. Earlie Server 97 lb BMI: 17   Estimated Energy Needs  Kcals: 1300-1500 calories Protein: 65-75 g Fluid: 1.5 L   NUTRITION DIAGNOSIS: Unintentional weight loss related to cancer, likely cancer related treatment side effects as well as evidenced by 3% weight loss in the last month   INTERVENTION:  Encouraged high calories shakes (350 calorie or higher) 1-2 per day. Discussed briefly ways to add calories and protein in diet.  Will need further follow-up. Patient agreeable to coming to nutrition appointment on 4/23 for follow-up.  Gave patient location of RD office in [place], EVALUATION, GOAL: patient will consume adequate calories and protein to prevent further weight loss   Next Visit: April 23 following MD visit  Mykeal Carrick B. Zenia Resides, [place], [place] Registered Dietitian [phone] (pager)

## 2019-02-18 ENCOUNTER — Encounter (HOSPITAL_COMMUNITY): Payer: Self-pay

## 2019-02-18 ENCOUNTER — Emergency Department (HOSPITAL_COMMUNITY)
Admission: EM | Admit: 2019-02-18 | Discharge: 2019-02-18 | Disposition: A | Discharge status: Home / Self Care | Payer: Medicare Other | Attending: Emergency Medicine | Admitting: Emergency Medicine

## 2019-02-18 ENCOUNTER — Emergency Department (HOSPITAL_COMMUNITY): Payer: Medicare Other

## 2019-02-18 ENCOUNTER — Other Ambulatory Visit: Payer: Self-pay

## 2019-02-18 DIAGNOSIS — W19XXXA Unspecified fall, initial encounter: Secondary | ICD-10-CM

## 2019-02-18 DIAGNOSIS — S42201A Unspecified fracture of upper end of right humerus, initial encounter for closed fracture: Secondary | ICD-10-CM | POA: Diagnosis not present

## 2019-02-18 DIAGNOSIS — M25511 Pain in right shoulder: Secondary | ICD-10-CM | POA: Diagnosis not present

## 2019-02-18 DIAGNOSIS — Z7401 Bed confinement status: Secondary | ICD-10-CM | POA: Diagnosis not present

## 2019-02-18 DIAGNOSIS — W010XXA Fall on same level from slipping, tripping and stumbling without subsequent striking against object, initial encounter: Secondary | ICD-10-CM | POA: Insufficient documentation

## 2019-02-18 DIAGNOSIS — Y92009 Unspecified place in unspecified non-institutional (private) residence as the place of occurrence of the external cause: Secondary | ICD-10-CM | POA: Diagnosis not present

## 2019-02-18 DIAGNOSIS — M2550 Pain in unspecified joint: Secondary | ICD-10-CM | POA: Diagnosis not present

## 2019-02-18 DIAGNOSIS — Y999 Unspecified external cause status: Secondary | ICD-10-CM | POA: Insufficient documentation

## 2019-02-18 DIAGNOSIS — S42291A Other displaced fracture of upper end of right humerus, initial encounter for closed fracture: Secondary | ICD-10-CM | POA: Diagnosis not present

## 2019-02-18 DIAGNOSIS — S4991XA Unspecified injury of right shoulder and upper arm, initial encounter: Secondary | ICD-10-CM | POA: Diagnosis present

## 2019-02-18 DIAGNOSIS — Z79899 Other long term (current) drug therapy: Secondary | ICD-10-CM | POA: Insufficient documentation

## 2019-02-18 DIAGNOSIS — Y9301 Activity, walking, marching and hiking: Secondary | ICD-10-CM | POA: Diagnosis not present

## 2019-02-18 DIAGNOSIS — R0902 Hypoxemia: Secondary | ICD-10-CM | POA: Diagnosis not present

## 2019-02-18 DIAGNOSIS — M25519 Pain in unspecified shoulder: Secondary | ICD-10-CM | POA: Diagnosis not present

## 2019-02-18 DIAGNOSIS — R29898 Other symptoms and signs involving the musculoskeletal system: Secondary | ICD-10-CM | POA: Diagnosis not present

## 2019-02-18 DIAGNOSIS — R52 Pain, unspecified: Secondary | ICD-10-CM | POA: Diagnosis not present

## 2019-02-18 MED ORDER — MORPHINE SULFATE (PF) 4 MG/ML IV SOLN
4.0000 mg | Freq: Once | INTRAVENOUS | Status: AC
  Administered 2019-02-18: 4 mg via INTRAMUSCULAR
  Filled 2019-02-18: qty 1

## 2019-02-18 MED ORDER — OXYCODONE-ACETAMINOPHEN 5-325 MG PO TABS: 1.0000 | ORAL_TABLET | ORAL | 0 refills | Status: AC | PRN

## 2019-02-18 NOTE — ED Provider Notes (Signed)
..      Durable Medical Equipment  (From admission, onward)         Start     Ordered   02/18/19 1730  For home use only DME 3 n 1  Once     02/18/19 1729   02/18/19 1728  For home use only DME lightweight manual wheelchair with seat cushion  Once    Comments:  Patient suffers from Acute surgical neck fracture right humerus, falls which impairs their ability to perform daily activities like walking and standing, bathing in the home.  A rolling walker will not resolve  issue with performing activities of daily living. A wheelchair will allow patient to safely perform daily activities. Patient is not able to propel themselves in the home using a standard weight wheelchair due to pain, weakness. Patient can self propel in the lightweight wheelchair.  Accessories: elevating leg rests (ELRs), wheel locks, extensions and anti-tippers.   02/18/19 [place], Sipriano Fendley Marie, PA-C 02/18/19 1752    Daleen Bo, MD 02/18/19 2225

## 2019-02-18 NOTE — Care Management (Cosign Needed)
..      Durable Medical Equipment  (From admission, onward)         Start     Ordered   02/18/19 1728  For home use only DME lightweight manual wheelchair with seat cushion  Once    Comments:  Patient suffers from Acute surgical neck fracture right humerus, falls which impairs their ability to perform daily activities like walking and standing, bathing in the home.  A rolling walker will not resolve  issue with performing activities of daily living. A wheelchair will allow patient to safely perform daily activities. Patient is not able to propel themselves in the home using a standard weight wheelchair due to pain, weakness. Patient can self propel in the lightweight wheelchair.  Accessories: elevating leg rests (ELRs), wheel locks, extensions and anti-tippers.   02/18/19 1728

## 2019-02-18 NOTE — ED Notes (Signed)
Bed: WA09 Expected date:  Expected time:  Means of arrival:  Comments: 68 yo shoulder pain

## 2019-02-18 NOTE — TOC Initial Note (Signed)
Transition of Care [facility]) - Initial/Assessment Note    Patient Details  Name: Claudia Perez MRN: [id number] Date of Birth: 1951-03-12  [facility] of [facility]) CM/SW Contact:    Erenest Rasher, RN Phone Number: 02/18/2019, 6:02 PM  Clinical Narrative:                  Spoke to pt and husband, husband agreeable to [facility] for [facility]. Will contact [employer] for delivery of DME on 02/19/2019.   Expected Discharge Plan: [place] Barriers to Discharge: No Barriers Identified   Patient Goals and CMS Choice Patient states their goals for this hospitalization and ongoing recovery are:: hurting real bad CMS Medicare.gov Compare Post Acute Care list provided to:: Patient Choice offered to / list presented to : Spouse  Expected Discharge Plan and Services Expected Discharge Plan: [place] In-house Referral: NA Discharge Planning Services: CM Consult Post Acute Care Choice: [place] arrangements for the past 2 months: Single Family Home                 DME Arranged: 3-N-1, Youth worker wheelchair with seat cushion DME Agency: [employer] HH Arranged: RN, PT, OT, Nurse's Aide, Social Work [employer] Agency: [facility]  Prior Living Arrangements/Services Living arrangements for the past 2 months: [place] Lives with:: Spouse          Need for Family Participation in Patient Care: Yes (Comment) Care giver support system in place?: Yes (comment)   Criminal Activity/Legal Involvement Pertinent to Current Situation/Hospitalization: No - Comment as needed  Activities of Daily Living      Permission Sought/Granted Permission sought to share information with : Case Manager, PCP Permission granted to share information with : Yes, Verbal Permission Granted  Share Information with NAME: Husband Mr Mackel           Emotional Assessment Appearance:: Appears older than stated age Attitude/Demeanor/Rapport:  Crying Affect (typically observed): Anxious, Tearful/Crying Orientation: : Oriented to Self, Oriented to Situation, Oriented to Place, Oriented to  Time Alcohol / Substance Use: Never Used    Admission diagnosis:  Shoulder Injury Patient Active Problem List   Diagnosis Date Noted  . Goals of care, counseling/discussion 09/19/2018  . Primary adenocarcinoma of left lung ([place]) 09/19/2018  . Encounter for antineoplastic chemotherapy 09/19/2018  . Overactive bladder 08/18/2017  . Faintness   . Left rotator cuff tear 02/01/2015  . Closed left humeral fracture 12/07/2014  . Fatigue 12/07/2014  . 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 Dystonia 08/10/2014  . Syncope and collapse 06/08/2014  . Orthostatic hypotension 06/08/2014  . 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 Fall down stairs 12/11/2013  . Basilar skull fracture ([place]) 12/11/2013  . Traumatic intracranial hemorrhage ([place]) 12/11/2013  . Back pain 12/11/2013  . Subarachnoid hemorrhage following injury ([place]) 12/09/2013  . Syncope 03/27/2013  . Clavicular fracture 03/27/2013  . COUGH 12/28/2007   PCP:  Leighton Ruff, MD Pharmacy:   [id number] - [place], [place] [address] Phone: [phone] Fax: [place], [place] [address] Phone: [phone] Fax: [phone]     Social Determinants of Health (SDOH) Interventions    Readmission Risk Interventions  No flowsheet data found.

## 2019-02-18 NOTE — Discharge Instructions (Signed)
Please make an appointment to follow up with orthopedics (Dr. Tamera Punt) Take Percocet as needed for severe pain Take Ibuprofen or Tylenol as needed for mild-moderate pain Return if worsening

## 2019-02-18 NOTE — ED Triage Notes (Addendum)
Pt BIB EMS from home. Pt was walking with walker and stripped over her shoe and is now having right shoulder pain. EMS reports suspected dislocation. Pt reports taking 1 Percocet at home before EMS arrived. Pt has language barrier and husband is at the bedside to help with health history.

## 2019-02-18 NOTE — ED Provider Notes (Signed)
  Face-to-face evaluation   History: Patient fell while using her walker today.  Exact mechanism of injury is unknown.  Isolated pain right upper arm.  She denies headache or neck pain.  Physical exam: Alert, calm, cooperative.  Right shoulder tender, without palpable deformity.  Right elbow nontender.  Neurovascularly, and function intact in right hand.  Medical screening examination/treatment/procedure(s) were conducted as a shared visit with non-physician practitioner(s) and myself.  I personally evaluated the patient during the encounter    Daleen Bo, MD 02/18/19 2225

## 2019-02-18 NOTE — ED Provider Notes (Signed)
[facility] DEPT Provider Note   CSN: [id number] Arrival date & time: 02/18/19  1435    History   Chief Complaint Chief Complaint  Patient presents with  . Shoulder Injury    HPI Claudia Perez is a 68 y.o. female who presents with a fall and R shoulder injury. PMH significant for progressive neurodegenerative disorder, frequent falls, non-small cell carcinoma,adenocarcinoma of the lung with bone mets, chronic neck pain, chronic gait instability. The patient states she was walking with a walker at home and tripped and fell and hurt her R shoulder. She reports severe, constant, non-radiating pain in the R shoulder. She tried percocet without relief. Her husband did not witness the fall. EMS was called and transported. The patient denies LOC, headache, neck pain, dizziness, vision changes, chest pain, SOB, abdominal pain, N/V, lower extremity pain. She has some chronic pain in the neck and L shoulder which is not worse than normal.    HPI  Past Medical History:  Diagnosis Date  . Abnormality of gait   . Fall at home 03/27/2013   "lost consciousness; fractured right clavicle" (03/27/2013)  . GERD (gastroesophageal reflux disease)   . Hypercholesteremia   . Hyperthyroidism    "a little bit; don't take RX for it" (03/27/2013)  . Mobility impaired   . Other closed skull fracture without mention of intracranial injury, unspecified state of consciousness   . Seizures ([place])    "think I might have; been blacking out several times in the past" (03/27/2013)  . Shortness of breath    "even now, just talking" (03/27/2013)  . Subdural hemorrhage ([place])   . Vitamin D deficiency     Patient Active Problem List   Diagnosis Date Noted  . Goals of care, counseling/discussion 09/19/2018  . Primary adenocarcinoma of left lung ([place]) 09/19/2018  . Encounter for antineoplastic chemotherapy 09/19/2018  . Overactive bladder 08/18/2017  . Faintness   . Left rotator cuff  tear 02/01/2015  . Closed left humeral fracture 12/07/2014  . Fatigue 12/07/2014  . 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 Dystonia 08/10/2014  . Syncope and collapse 06/08/2014  . Orthostatic hypotension 06/08/2014  . 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 Fall down stairs 12/11/2013  . Basilar skull fracture ([place]) 12/11/2013  . Traumatic intracranial hemorrhage ([place]) 12/11/2013  . Back pain 12/11/2013  . Subarachnoid hemorrhage following injury ([place]) 12/09/2013  . Syncope 03/27/2013  . Clavicular fracture 03/27/2013  . COUGH 12/28/2007    Past Surgical History:  Procedure Laterality Date  . APPENDECTOMY  1960's  . BRAIN SURGERY  10/2003   SDH evac  . EYE SURGERY  02/2013   cataract extraction w/ intraocular lens implant     OB History   No obstetric history on file.      Home Medications    Prior to Admission medications   Medication Sig Start Date End Date Taking? Authorizing Provider  acetaminophen (TYLENOL) 500 MG tablet Take 1,000 mg by mouth daily.    Yes [provider]  Multiple Vitamin (MULTIVITAMIN) capsule Take 1 capsule by mouth daily.   Yes [provider]  TAGRISSO 80 MG tablet TAKE 1 TABLET (80 MG TOTAL) BY MOUTH DAILY. Patient taking differently: Take 80 mg by mouth daily.  11/18/18  Yes Curt Bears, MD  Vitamin D, Ergocalciferol, 2000 units CAPS Take  1 capsule by mouth daily.  03/27/14  Yes [provider]  magic mouthwash w/lidocaine SOLN Take 5 mLs by mouth 3 (three) times daily as needed for mouth pain. Patient not taking: Reported on 02/18/2019 11/18/18   Harle Stanford., PA-C  oxyCODONE-acetaminophen (PERCOCET/ROXICET) 5-325 MG tablet Take 1 tablet by mouth every 8 (eight) hours as needed for severe pain. Patient not taking: Reported on 01/16/2019 08/30/18    Curt Bears, MD    Family History Family History  Problem Relation Age of Onset  . Hypertension Mother   . Cirrhosis Brother     Social History Social History   Tobacco Use  . Smoking status: Never Smoker  . Smokeless tobacco: Never Used  Substance Use Topics  . Alcohol use: Yes    Comment: 03/27/2013 glass of wine "couple times/yr"  . Drug use: No     Allergies   Nitrofurantoin; Phenazopyridine; Sulfamethoxazole-trimethoprim; and Sulfamethoxazole   Review of Systems Review of Systems  Constitutional: Negative for fever.  Eyes: Negative for visual disturbance.  Respiratory: Negative for shortness of breath.   Cardiovascular: Negative for chest pain.  Gastrointestinal: Negative for abdominal pain.  Musculoskeletal: Positive for arthralgias and gait problem. Negative for neck pain.  Neurological: Positive for weakness. Negative for dizziness, numbness and headaches.  All other systems reviewed and are negative.    Physical Exam Updated Vital Signs BP 131/77 (BP Location: Left Arm)   Pulse 72   Temp 98.2 F (36.8 C) (Oral)   Resp 16   Ht 5\' 1"  (1.549 m)   Wt 42.7 kg   SpO2 100%   BMI 17.79 kg/m   Physical Exam Vitals signs and nursing note reviewed.  Constitutional:      General: She is not in acute distress.    Appearance: Normal appearance. She is well-developed. She is not ill-appearing.     Comments: Cooperative. Choriform movements, dysarthria which is baseline. In C-collar  HENT:     Head: Normocephalic and atraumatic.  Eyes:     General: No scleral icterus.       Right eye: No discharge.        Left eye: No discharge.     Conjunctiva/sclera: Conjunctivae normal.     Pupils: Pupils are equal, round, and reactive to light.     Comments: Pinpoint pupils  Neck:     Musculoskeletal: Normal range of motion.     Comments: No tenderness or step offs. Normal ROM without pain. Cardiovascular:     Rate and Rhythm: Normal rate and regular rhythm.   Pulmonary:     Effort: Pulmonary effort is normal. No respiratory distress.     Breath sounds: Normal breath sounds.  Abdominal:     General: There is no distension.     Palpations: Abdomen is soft.     Tenderness: There is no abdominal tenderness.  Musculoskeletal:     Comments: Tenderness over R shoulder. No elbow or wrist/hand tenderness. 2+ radial pulse.  No tenderness over L shoulder but decreased ROM of shoulder which pt states is chronic from old L humerus fracture. No elbow, wrist, L hand tenderness. 2+ radial pulse  FROM of LLE and RLE  Skin:    General: Skin is warm and dry.  Neurological:     Mental Status: She is alert and oriented to person, place, and time.  Psychiatric:        Behavior: Behavior normal.      ED Treatments / Results  Labs (all labs ordered  are listed, but only abnormal results are displayed) Labs Reviewed - No data to display  EKG None  Radiology Dg Shoulder Right  Result Date: 02/18/2019 CLINICAL DATA:  Right shoulder pain due to a fall today. Initial encounter. EXAM: RIGHT SHOULDER - 2+ VIEW COMPARISON:  Plain films right shoulder 03/27/2013. FINDINGS: The patient has an acute fracture of the surgical neck of the humerus with slight impaction. No other acute bony or joint abnormality is seen. IMPRESSION: Acute surgical neck fracture right humerus. Electronically Signed   By: Inge Rise M.D.   On: 02/18/2019 15:06    Procedures Procedures (including critical care time)  Medications Ordered in ED Medications - No data to display   Initial Impression / Assessment and Plan / ED Course  I have reviewed the triage vital signs and the nursing notes.  Pertinent labs & imaging results that were available during my care of the patient were reviewed by me and considered in my medical decision making (see chart for details).  68 year old female with progressive neurologic d/o, frequent falls due to gait instability presents with acute R  shoulder pain after a fall. Xray shows proximal humerus fracture. Her vitals are normal. She was placed in a C-collar but has no C-spine tenderness and FROM without pain. Shared visit with Dr. Eulis Foster. Will place in shoulder immobilizer. She will need a wheelchair now that she is unable to use her walker and possible more help at home. Will consult CM  PT was set up with a wheelchair, home health, PT/OT, RN/aide, social work. She was given rx for Percocet and advised to f/u with orthopedics.  Final Clinical Impressions(s) / ED Diagnoses   Final diagnoses:  Closed fracture of proximal end of right humerus, unspecified fracture morphology, initial encounter  Fall, initial encounter    ED Discharge Orders    None       Iris Pert 02/18/19 Esaw Dace, MD 02/18/19 2224

## 2019-02-21 DIAGNOSIS — G319 Degenerative disease of nervous system, unspecified: Secondary | ICD-10-CM | POA: Diagnosis not present

## 2019-02-21 DIAGNOSIS — E7800 Pure hypercholesterolemia, unspecified: Secondary | ICD-10-CM | POA: Diagnosis not present

## 2019-02-21 DIAGNOSIS — F329 Major depressive disorder, single episode, unspecified: Secondary | ICD-10-CM | POA: Diagnosis not present

## 2019-02-21 DIAGNOSIS — G8929 Other chronic pain: Secondary | ICD-10-CM | POA: Diagnosis not present

## 2019-02-21 DIAGNOSIS — S0990XS Unspecified injury of head, sequela: Secondary | ICD-10-CM | POA: Diagnosis not present

## 2019-02-21 DIAGNOSIS — C3492 Malignant neoplasm of unspecified part of left bronchus or lung: Secondary | ICD-10-CM | POA: Diagnosis not present

## 2019-02-21 DIAGNOSIS — R569 Unspecified convulsions: Secondary | ICD-10-CM | POA: Diagnosis not present

## 2019-02-21 DIAGNOSIS — K219 Gastro-esophageal reflux disease without esophagitis: Secondary | ICD-10-CM | POA: Diagnosis not present

## 2019-02-21 DIAGNOSIS — Z8781 Personal history of (healed) traumatic fracture: Secondary | ICD-10-CM | POA: Diagnosis not present

## 2019-02-21 DIAGNOSIS — E0590 Thyrotoxicosis, unspecified without thyrotoxic crisis or storm: Secondary | ICD-10-CM | POA: Diagnosis not present

## 2019-02-21 DIAGNOSIS — Z9181 History of falling: Secondary | ICD-10-CM | POA: Diagnosis not present

## 2019-02-21 DIAGNOSIS — S42211D Unspecified displaced fracture of surgical neck of right humerus, subsequent encounter for fracture with routine healing: Secondary | ICD-10-CM | POA: Diagnosis not present

## 2019-02-21 DIAGNOSIS — C7951 Secondary malignant neoplasm of bone: Secondary | ICD-10-CM | POA: Diagnosis not present

## 2019-02-21 DIAGNOSIS — E559 Vitamin D deficiency, unspecified: Secondary | ICD-10-CM | POA: Diagnosis not present

## 2019-02-21 DIAGNOSIS — M75102 Unspecified rotator cuff tear or rupture of left shoulder, not specified as traumatic: Secondary | ICD-10-CM | POA: Diagnosis not present

## 2019-02-21 DIAGNOSIS — Z961 Presence of intraocular lens: Secondary | ICD-10-CM | POA: Diagnosis not present

## 2019-02-21 DIAGNOSIS — M542 Cervicalgia: Secondary | ICD-10-CM | POA: Diagnosis not present

## 2019-02-21 DIAGNOSIS — N3281 Overactive bladder: Secondary | ICD-10-CM | POA: Diagnosis not present

## 2019-02-22 DIAGNOSIS — G319 Degenerative disease of nervous system, unspecified: Secondary | ICD-10-CM | POA: Diagnosis not present

## 2019-02-22 DIAGNOSIS — M542 Cervicalgia: Secondary | ICD-10-CM | POA: Diagnosis not present

## 2019-02-22 DIAGNOSIS — M75102 Unspecified rotator cuff tear or rupture of left shoulder, not specified as traumatic: Secondary | ICD-10-CM | POA: Diagnosis not present

## 2019-02-22 DIAGNOSIS — C3492 Malignant neoplasm of unspecified part of left bronchus or lung: Secondary | ICD-10-CM | POA: Diagnosis not present

## 2019-02-22 DIAGNOSIS — S42211D Unspecified displaced fracture of surgical neck of right humerus, subsequent encounter for fracture with routine healing: Secondary | ICD-10-CM | POA: Diagnosis not present

## 2019-02-22 DIAGNOSIS — M25511 Pain in right shoulder: Secondary | ICD-10-CM | POA: Diagnosis not present

## 2019-02-22 DIAGNOSIS — C7951 Secondary malignant neoplasm of bone: Secondary | ICD-10-CM | POA: Diagnosis not present

## 2019-02-24 DIAGNOSIS — S42211D Unspecified displaced fracture of surgical neck of right humerus, subsequent encounter for fracture with routine healing: Secondary | ICD-10-CM | POA: Diagnosis not present

## 2019-02-24 DIAGNOSIS — C3492 Malignant neoplasm of unspecified part of left bronchus or lung: Secondary | ICD-10-CM | POA: Diagnosis not present

## 2019-02-24 DIAGNOSIS — C7951 Secondary malignant neoplasm of bone: Secondary | ICD-10-CM | POA: Diagnosis not present

## 2019-02-24 DIAGNOSIS — M75102 Unspecified rotator cuff tear or rupture of left shoulder, not specified as traumatic: Secondary | ICD-10-CM | POA: Diagnosis not present

## 2019-02-24 DIAGNOSIS — M542 Cervicalgia: Secondary | ICD-10-CM | POA: Diagnosis not present

## 2019-02-24 DIAGNOSIS — G319 Degenerative disease of nervous system, unspecified: Secondary | ICD-10-CM | POA: Diagnosis not present

## 2019-02-28 DIAGNOSIS — M75102 Unspecified rotator cuff tear or rupture of left shoulder, not specified as traumatic: Secondary | ICD-10-CM | POA: Diagnosis not present

## 2019-02-28 DIAGNOSIS — M542 Cervicalgia: Secondary | ICD-10-CM | POA: Diagnosis not present

## 2019-02-28 DIAGNOSIS — C3492 Malignant neoplasm of unspecified part of left bronchus or lung: Secondary | ICD-10-CM | POA: Diagnosis not present

## 2019-02-28 DIAGNOSIS — C7951 Secondary malignant neoplasm of bone: Secondary | ICD-10-CM | POA: Diagnosis not present

## 2019-02-28 DIAGNOSIS — G319 Degenerative disease of nervous system, unspecified: Secondary | ICD-10-CM | POA: Diagnosis not present

## 2019-02-28 DIAGNOSIS — S42211D Unspecified displaced fracture of surgical neck of right humerus, subsequent encounter for fracture with routine healing: Secondary | ICD-10-CM | POA: Diagnosis not present

## 2019-03-02 DIAGNOSIS — C3492 Malignant neoplasm of unspecified part of left bronchus or lung: Secondary | ICD-10-CM | POA: Diagnosis not present

## 2019-03-02 DIAGNOSIS — C7951 Secondary malignant neoplasm of bone: Secondary | ICD-10-CM | POA: Diagnosis not present

## 2019-03-02 DIAGNOSIS — M542 Cervicalgia: Secondary | ICD-10-CM | POA: Diagnosis not present

## 2019-03-02 DIAGNOSIS — M75102 Unspecified rotator cuff tear or rupture of left shoulder, not specified as traumatic: Secondary | ICD-10-CM | POA: Diagnosis not present

## 2019-03-02 DIAGNOSIS — G319 Degenerative disease of nervous system, unspecified: Secondary | ICD-10-CM | POA: Diagnosis not present

## 2019-03-02 DIAGNOSIS — S42211D Unspecified displaced fracture of surgical neck of right humerus, subsequent encounter for fracture with routine healing: Secondary | ICD-10-CM | POA: Diagnosis not present

## 2019-03-03 DIAGNOSIS — C3492 Malignant neoplasm of unspecified part of left bronchus or lung: Secondary | ICD-10-CM | POA: Diagnosis not present

## 2019-03-03 DIAGNOSIS — C7951 Secondary malignant neoplasm of bone: Secondary | ICD-10-CM | POA: Diagnosis not present

## 2019-03-03 DIAGNOSIS — M542 Cervicalgia: Secondary | ICD-10-CM | POA: Diagnosis not present

## 2019-03-03 DIAGNOSIS — M75102 Unspecified rotator cuff tear or rupture of left shoulder, not specified as traumatic: Secondary | ICD-10-CM | POA: Diagnosis not present

## 2019-03-03 DIAGNOSIS — G319 Degenerative disease of nervous system, unspecified: Secondary | ICD-10-CM | POA: Diagnosis not present

## 2019-03-03 DIAGNOSIS — S42211D Unspecified displaced fracture of surgical neck of right humerus, subsequent encounter for fracture with routine healing: Secondary | ICD-10-CM | POA: Diagnosis not present

## 2019-03-07 DIAGNOSIS — C3492 Malignant neoplasm of unspecified part of left bronchus or lung: Secondary | ICD-10-CM | POA: Diagnosis not present

## 2019-03-07 DIAGNOSIS — S42211D Unspecified displaced fracture of surgical neck of right humerus, subsequent encounter for fracture with routine healing: Secondary | ICD-10-CM | POA: Diagnosis not present

## 2019-03-07 DIAGNOSIS — M75102 Unspecified rotator cuff tear or rupture of left shoulder, not specified as traumatic: Secondary | ICD-10-CM | POA: Diagnosis not present

## 2019-03-07 DIAGNOSIS — M542 Cervicalgia: Secondary | ICD-10-CM | POA: Diagnosis not present

## 2019-03-07 DIAGNOSIS — G319 Degenerative disease of nervous system, unspecified: Secondary | ICD-10-CM | POA: Diagnosis not present

## 2019-03-07 DIAGNOSIS — C7951 Secondary malignant neoplasm of bone: Secondary | ICD-10-CM | POA: Diagnosis not present

## 2019-03-09 DIAGNOSIS — G319 Degenerative disease of nervous system, unspecified: Secondary | ICD-10-CM | POA: Diagnosis not present

## 2019-03-09 DIAGNOSIS — C7951 Secondary malignant neoplasm of bone: Secondary | ICD-10-CM | POA: Diagnosis not present

## 2019-03-09 DIAGNOSIS — M542 Cervicalgia: Secondary | ICD-10-CM | POA: Diagnosis not present

## 2019-03-09 DIAGNOSIS — S42211D Unspecified displaced fracture of surgical neck of right humerus, subsequent encounter for fracture with routine healing: Secondary | ICD-10-CM | POA: Diagnosis not present

## 2019-03-09 DIAGNOSIS — C3492 Malignant neoplasm of unspecified part of left bronchus or lung: Secondary | ICD-10-CM | POA: Diagnosis not present

## 2019-03-09 DIAGNOSIS — M75102 Unspecified rotator cuff tear or rupture of left shoulder, not specified as traumatic: Secondary | ICD-10-CM | POA: Diagnosis not present

## 2019-03-13 DIAGNOSIS — M25511 Pain in right shoulder: Secondary | ICD-10-CM | POA: Diagnosis not present

## 2019-03-13 DIAGNOSIS — M25512 Pain in left shoulder: Secondary | ICD-10-CM | POA: Diagnosis not present

## 2019-03-14 DIAGNOSIS — C3492 Malignant neoplasm of unspecified part of left bronchus or lung: Secondary | ICD-10-CM | POA: Diagnosis not present

## 2019-03-14 DIAGNOSIS — M75102 Unspecified rotator cuff tear or rupture of left shoulder, not specified as traumatic: Secondary | ICD-10-CM | POA: Diagnosis not present

## 2019-03-14 DIAGNOSIS — C7951 Secondary malignant neoplasm of bone: Secondary | ICD-10-CM | POA: Diagnosis not present

## 2019-03-14 DIAGNOSIS — S42211D Unspecified displaced fracture of surgical neck of right humerus, subsequent encounter for fracture with routine healing: Secondary | ICD-10-CM | POA: Diagnosis not present

## 2019-03-14 DIAGNOSIS — G319 Degenerative disease of nervous system, unspecified: Secondary | ICD-10-CM | POA: Diagnosis not present

## 2019-03-14 DIAGNOSIS — M542 Cervicalgia: Secondary | ICD-10-CM | POA: Diagnosis not present

## 2019-03-16 DIAGNOSIS — G319 Degenerative disease of nervous system, unspecified: Secondary | ICD-10-CM | POA: Diagnosis not present

## 2019-03-16 DIAGNOSIS — M542 Cervicalgia: Secondary | ICD-10-CM | POA: Diagnosis not present

## 2019-03-16 DIAGNOSIS — M75102 Unspecified rotator cuff tear or rupture of left shoulder, not specified as traumatic: Secondary | ICD-10-CM | POA: Diagnosis not present

## 2019-03-16 DIAGNOSIS — C3492 Malignant neoplasm of unspecified part of left bronchus or lung: Secondary | ICD-10-CM | POA: Diagnosis not present

## 2019-03-16 DIAGNOSIS — C7951 Secondary malignant neoplasm of bone: Secondary | ICD-10-CM | POA: Diagnosis not present

## 2019-03-16 DIAGNOSIS — S42211D Unspecified displaced fracture of surgical neck of right humerus, subsequent encounter for fracture with routine healing: Secondary | ICD-10-CM | POA: Diagnosis not present

## 2019-03-17 DIAGNOSIS — S42211D Unspecified displaced fracture of surgical neck of right humerus, subsequent encounter for fracture with routine healing: Secondary | ICD-10-CM | POA: Diagnosis not present

## 2019-03-17 DIAGNOSIS — M75102 Unspecified rotator cuff tear or rupture of left shoulder, not specified as traumatic: Secondary | ICD-10-CM | POA: Diagnosis not present

## 2019-03-17 DIAGNOSIS — M542 Cervicalgia: Secondary | ICD-10-CM | POA: Diagnosis not present

## 2019-03-17 DIAGNOSIS — G319 Degenerative disease of nervous system, unspecified: Secondary | ICD-10-CM | POA: Diagnosis not present

## 2019-03-17 DIAGNOSIS — C3492 Malignant neoplasm of unspecified part of left bronchus or lung: Secondary | ICD-10-CM | POA: Diagnosis not present

## 2019-03-17 DIAGNOSIS — C7951 Secondary malignant neoplasm of bone: Secondary | ICD-10-CM | POA: Diagnosis not present

## 2019-03-21 DIAGNOSIS — M542 Cervicalgia: Secondary | ICD-10-CM | POA: Diagnosis not present

## 2019-03-21 DIAGNOSIS — C7951 Secondary malignant neoplasm of bone: Secondary | ICD-10-CM | POA: Diagnosis not present

## 2019-03-21 DIAGNOSIS — C3492 Malignant neoplasm of unspecified part of left bronchus or lung: Secondary | ICD-10-CM | POA: Diagnosis not present

## 2019-03-21 DIAGNOSIS — S42211D Unspecified displaced fracture of surgical neck of right humerus, subsequent encounter for fracture with routine healing: Secondary | ICD-10-CM | POA: Diagnosis not present

## 2019-03-21 DIAGNOSIS — M75102 Unspecified rotator cuff tear or rupture of left shoulder, not specified as traumatic: Secondary | ICD-10-CM | POA: Diagnosis not present

## 2019-03-21 DIAGNOSIS — G319 Degenerative disease of nervous system, unspecified: Secondary | ICD-10-CM | POA: Diagnosis not present

## 2019-03-22 ENCOUNTER — Telehealth: Payer: Self-pay | Admitting: Internal Medicine

## 2019-03-22 NOTE — Telephone Encounter (Signed)
Called patient regarding upcoming Webex appointment, patient does not have access for Webex and would prefer this to be a telephone visit. The patient's relative would prefer the provider call the number documented in the appointment notes as well.   Message to provider.

## 2019-03-23 ENCOUNTER — Encounter: Payer: Self-pay | Admitting: Internal Medicine

## 2019-03-23 ENCOUNTER — Inpatient Hospital Stay: Payer: Medicare Other | Attending: Internal Medicine

## 2019-03-23 ENCOUNTER — Inpatient Hospital Stay (HOSPITAL_BASED_OUTPATIENT_CLINIC_OR_DEPARTMENT_OTHER): Payer: Medicare Other | Admitting: Internal Medicine

## 2019-03-23 ENCOUNTER — Other Ambulatory Visit: Payer: Self-pay

## 2019-03-23 ENCOUNTER — Inpatient Hospital Stay: Payer: Medicare Other | Admitting: Nutrition

## 2019-03-23 VITALS — BP 124/73 | HR 74 | Temp 98.6°F | Resp 18 | Ht 61.0 in

## 2019-03-23 DIAGNOSIS — E0590 Thyrotoxicosis, unspecified without thyrotoxic crisis or storm: Secondary | ICD-10-CM | POA: Diagnosis not present

## 2019-03-23 DIAGNOSIS — R569 Unspecified convulsions: Secondary | ICD-10-CM | POA: Diagnosis not present

## 2019-03-23 DIAGNOSIS — Z8781 Personal history of (healed) traumatic fracture: Secondary | ICD-10-CM | POA: Diagnosis not present

## 2019-03-23 DIAGNOSIS — S42211D Unspecified displaced fracture of surgical neck of right humerus, subsequent encounter for fracture with routine healing: Secondary | ICD-10-CM | POA: Diagnosis not present

## 2019-03-23 DIAGNOSIS — C3492 Malignant neoplasm of unspecified part of left bronchus or lung: Secondary | ICD-10-CM

## 2019-03-23 DIAGNOSIS — N3281 Overactive bladder: Secondary | ICD-10-CM | POA: Diagnosis not present

## 2019-03-23 DIAGNOSIS — C7951 Secondary malignant neoplasm of bone: Secondary | ICD-10-CM

## 2019-03-23 DIAGNOSIS — S0990XS Unspecified injury of head, sequela: Secondary | ICD-10-CM | POA: Diagnosis not present

## 2019-03-23 DIAGNOSIS — C3490 Malignant neoplasm of unspecified part of unspecified bronchus or lung: Secondary | ICD-10-CM

## 2019-03-23 DIAGNOSIS — G319 Degenerative disease of nervous system, unspecified: Secondary | ICD-10-CM | POA: Diagnosis not present

## 2019-03-23 DIAGNOSIS — C3412 Malignant neoplasm of upper lobe, left bronchus or lung: Secondary | ICD-10-CM | POA: Diagnosis not present

## 2019-03-23 DIAGNOSIS — Z5111 Encounter for antineoplastic chemotherapy: Secondary | ICD-10-CM

## 2019-03-23 DIAGNOSIS — M75102 Unspecified rotator cuff tear or rupture of left shoulder, not specified as traumatic: Secondary | ICD-10-CM | POA: Diagnosis not present

## 2019-03-23 DIAGNOSIS — Z9181 History of falling: Secondary | ICD-10-CM | POA: Diagnosis not present

## 2019-03-23 DIAGNOSIS — E7800 Pure hypercholesterolemia, unspecified: Secondary | ICD-10-CM | POA: Diagnosis not present

## 2019-03-23 DIAGNOSIS — M542 Cervicalgia: Secondary | ICD-10-CM | POA: Diagnosis not present

## 2019-03-23 DIAGNOSIS — K219 Gastro-esophageal reflux disease without esophagitis: Secondary | ICD-10-CM | POA: Diagnosis not present

## 2019-03-23 DIAGNOSIS — G8929 Other chronic pain: Secondary | ICD-10-CM | POA: Diagnosis not present

## 2019-03-23 DIAGNOSIS — E559 Vitamin D deficiency, unspecified: Secondary | ICD-10-CM | POA: Diagnosis not present

## 2019-03-23 DIAGNOSIS — F329 Major depressive disorder, single episode, unspecified: Secondary | ICD-10-CM | POA: Diagnosis not present

## 2019-03-23 DIAGNOSIS — Z961 Presence of intraocular lens: Secondary | ICD-10-CM | POA: Diagnosis not present

## 2019-03-23 LAB — CMP (CANCER CENTER ONLY)
ALT: 9 U/L (ref 0–44)
AST: 17 U/L (ref 15–41)
Albumin: 3.7 g/dL (ref 3.5–5.0)
Alkaline Phosphatase: 89 U/L (ref 38–126)
Anion gap: 10 (ref 5–15)
BUN: 16 mg/dL (ref 8–23)
CO2: 27 mmol/L (ref 22–32)
Calcium: 9.2 mg/dL (ref 8.9–10.3)
Chloride: 103 mmol/L (ref 98–111)
Creatinine: 0.74 mg/dL (ref 0.44–1.00)
GFR, Est AFR Am: >60 mL/min (ref >60)
GFR, Estimated: >60 mL/min (ref >60)
Glucose, Bld: 149 mg/dL — ABNORMAL HIGH (ref 70–99)
Potassium: 4.4 mmol/L (ref 3.5–5.1)
Sodium: 140 mmol/L (ref 135–145)
Total Bilirubin: 0.3 mg/dL (ref 0.3–1.2)
Total Protein: 7.1 g/dL (ref 6.5–8.1)

## 2019-03-23 LAB — CBC WITH DIFFERENTIAL (CANCER CENTER ONLY)
Abs Immature Granulocytes: 0.01 K/uL (ref 0.00–0.07)
Basophils Absolute: 0 K/uL (ref 0.0–0.1)
Basophils Relative: 1 %
Eosinophils Absolute: 0.1 K/uL (ref 0.0–0.5)
Eosinophils Relative: 1 %
HCT: 39.1 % (ref 36.0–46.0)
Hemoglobin: 12.2 g/dL (ref 12.0–15.0)
Immature Granulocytes: 0 %
Lymphocytes Relative: 33 %
Lymphs Abs: 1.9 K/uL (ref 0.7–4.0)
MCH: 32.6 pg (ref 26.0–34.0)
MCHC: 31.2 g/dL (ref 30.0–36.0)
MCV: 104.5 fL — ABNORMAL HIGH (ref 80.0–100.0)
Monocytes Absolute: 0.6 K/uL (ref 0.1–1.0)
Monocytes Relative: 11 %
Neutro Abs: 3.1 K/uL (ref 1.7–7.7)
Neutrophils Relative %: 54 %
Platelet Count: 241 K/uL (ref 150–400)
RBC: 3.74 MIL/uL — ABNORMAL LOW (ref 3.87–5.11)
RDW: 12.8 % (ref 11.5–15.5)
WBC Count: 5.7 K/uL (ref 4.0–10.5)
nRBC: 0 % (ref 0.0–0.2)

## 2019-03-23 NOTE — Progress Notes (Signed)
[place] Telephone:(336) [phone]   Fax:(336) [place], [place], [place] [address]  DIAGNOSIS:Stage IV (T2 a, N2, M1c)non-small cell carcinoma,adenocarcinoma, presented with left upper lobe lung mass in addition to multiple pleural-based nodules as well as suspicious bone metastasis to the left humeral neck, left ninth and 10th ribs as well as right femoral neck.  Biomarker Findings Tumor Mutational Burden - TMB-Intermediate (16 Muts/Mb) Microsatellite status - MS-Stable Genomic Findings For a complete list of the genes assayed, please refer to the Appendix. EGFR exon 19 deletion ([id number]_A750del), amplification MDM2 amplification EPHA3 V206L RB1 loss exons 18-27 TP53 R273H, R280K 7 Disease relevant genes with no reportable alterations: KRAS, ALK, BRAF, MET, RET, ERBB2, ROS1  PDL 1 expression is 50%.  PRIOR THERAPY:None  CURRENT THERAPY: Tagrisso 80 mg p.o. daily first dose October 05, 2018.  Status post 6 months of treatment.  INTERVAL HISTORY: Claudia Perez 68 y.o. female returns to the clinic today for follow-up visit.  The patient is feeling fine today with no concerning complaints.  She had a fall last months and continues to have pain in her right shoulder.  She denied having any current chest pain, shortness of breath, cough or hemoptysis.  She denied having any fever or chills.  She has no nausea, vomiting, diarrhea or constipation.  She continues to tolerate her current treatment with Tagrisso fairly well.  The patient is here today for evaluation and repeat blood work.  MEDICAL HISTORY: Past Medical History:  Diagnosis Date  . Abnormality of gait   . Fall at home 03/27/2013   "lost consciousness; fractured right clavicle" (03/27/2013)  . GERD (gastroesophageal reflux disease)   . Hypercholesteremia   . Hyperthyroidism    "a little bit; don't take RX for it" (03/27/2013)   . Mobility impaired   . Other closed skull fracture without mention of intracranial injury, unspecified state of consciousness   . Seizures ([place])    "think I might have; been blacking out several times in the past" (03/27/2013)  . Shortness of breath    "even now, just talking" (03/27/2013)  . Subdural hemorrhage ([place])   . Vitamin D deficiency     ALLERGIES:  is allergic to nitrofurantoin; phenazopyridine; sulfamethoxazole-trimethoprim; and sulfamethoxazole.  MEDICATIONS:  Current Outpatient Medications  Medication Sig Dispense Refill  . acetaminophen (TYLENOL) 500 MG tablet Take 1,000 mg by mouth daily.     . Multiple Vitamin (MULTIVITAMIN) capsule Take 1 capsule by mouth daily.    Marland Kitchen oxyCODONE-acetaminophen (PERCOCET/ROXICET) 5-325 MG tablet Take 1 tablet by mouth every 4 (four) hours as needed for severe pain. 20 tablet 0  . TAGRISSO 80 MG tablet TAKE 1 TABLET (80 MG TOTAL) BY MOUTH DAILY. (Patient taking differently: Take 80 mg by mouth daily. ) 30 tablet 1  . Vitamin D, Ergocalciferol, 2000 units CAPS Take 1 capsule by mouth daily.      No current facility-administered medications for this visit.     SURGICAL HISTORY:  Past Surgical History:  Procedure Laterality Date  . APPENDECTOMY  1960's  . BRAIN SURGERY  10/2003   SDH evac  . EYE SURGERY  02/2013   cataract extraction w/ intraocular lens implant    REVIEW OF SYSTEMS:  A comprehensive review of systems was negative except for: Constitutional: positive for fatigue Musculoskeletal: positive for arthralgias and muscle weakness   PHYSICAL EXAMINATION: General appearance: alert, cooperative, fatigued and no distress Head: Normocephalic, without obvious abnormality,  atraumatic Neck: no adenopathy, no JVD, supple, symmetrical, trachea midline and thyroid not enlarged, symmetric, no tenderness/mass/nodules Lymph nodes: Cervical, supraclavicular, and axillary nodes normal. Resp: clear to auscultation bilaterally Back:  symmetric, no curvature. ROM normal. No CVA tenderness. Cardio: regular rate and rhythm, S1, S2 normal, no murmur, click, rub or gallop GI: soft, non-tender; bowel sounds normal; no masses,  no organomegaly Extremities: extremities normal, atraumatic, no cyanosis or edema  ECOG PERFORMANCE STATUS: 1 - Symptomatic but completely ambulatory  Blood pressure 124/73, pulse 74, temperature 98.6 F (37 C), temperature source Oral, resp. rate 18, height _0  (1.549 m), SpO2 100 %.  LABORATORY DATA: Lab Results  Component Value Date   WBC 5.7 03/23/2019   HGB 12.2 03/23/2019   HCT 39.1 03/23/2019   MCV 104.5 (H) 03/23/2019   PLT 241 03/23/2019      Chemistry      Component Value Date/Time   NA 139 02/13/2019 1136   K 3.9 02/13/2019 1136   CL 103 02/13/2019 1136   CO2 25 02/13/2019 1136   BUN 16 02/13/2019 1136   CREATININE 0.77 02/13/2019 1136   CREATININE 0.73 06/08/2014 1215      Component Value Date/Time   CALCIUM 9.4 02/13/2019 1136   ALKPHOS 64 02/13/2019 1136   AST 16 02/13/2019 1136   ALT 11 02/13/2019 1136   BILITOT 0.4 02/13/2019 1136       RADIOGRAPHIC STUDIES: No results found.  ASSESSMENT AND PLAN:  This is a very pleasant 68 years old African female with stage IV non-small cell lung cancer, adenocarcinoma with positive EGFR mutation. She is currently undergoing treatment with targeted therapy with Tagrisso 80 mg p.o. daily status post 6 months of treatment.  She has been tolerating this treatment well with no concerning adverse effects. I recommended for the patient to continue her current treatment with Tagrisso with the same dose.  His blood work today is unremarkable for any concerning findings. I will see her back for follow-up visit in 6 weeks with repeat CT scan of the chest, abdomen and pelvis for restaging of her disease. The patient was advised to call immediately if she has any concerning symptoms in the interval. I discussed the assessment and  treatment plan with the patient. The patient was provided an opportunity to ask questions and all were answered. The patient agreed with the plan and demonstrated an understanding of the instructions.   The patient was advised to call back or seek an in-person evaluation if the symptoms worsen or if the condition fails to improve as anticipated.  Eilleen Kempf, MD 03/23/2019 11:57 AM  Disclaimer: This note was dictated with voice recognition software. Similar sounding words can inadvertently be transcribed and may not be corrected upon review.

## 2019-03-23 NOTE — Progress Notes (Signed)
RD working remotely.  Patient being treated for NSCLC. Contacted patient and completed nutrition follow up. Patient states her appetite is improved and she is eating better. She is drinking nutrition shakes. Patient refused weight at MD office yesterday so current weight is not available. Last documented weight was 94 pounds on March 21. Denies other nutrition impact symptoms.  Nutrition Diagnosis: Unintentional weight loss cannot be evaluated.  Intervention; Educated patient to continue strategies for increase oral intake and continue ONS. Offered to mail coupons to patient which she accepted and voiced appreciation. She denies further needs or questions.  Monitoring, Evaluation, Goals: Patient will increase oral intake to minimize weight loss.  Next Visit: Patient will call for questions.

## 2019-03-24 ENCOUNTER — Telehealth: Payer: Self-pay | Admitting: Internal Medicine

## 2019-03-24 ENCOUNTER — Telehealth: Payer: Self-pay | Admitting: Medical Oncology

## 2019-03-24 NOTE — Telephone Encounter (Signed)
LVM with date for lab and CT scan

## 2019-03-24 NOTE — Telephone Encounter (Signed)
Tried to reach regarding schedule °

## 2019-03-28 DIAGNOSIS — G319 Degenerative disease of nervous system, unspecified: Secondary | ICD-10-CM | POA: Diagnosis not present

## 2019-03-28 DIAGNOSIS — S42211D Unspecified displaced fracture of surgical neck of right humerus, subsequent encounter for fracture with routine healing: Secondary | ICD-10-CM | POA: Diagnosis not present

## 2019-03-28 DIAGNOSIS — C3492 Malignant neoplasm of unspecified part of left bronchus or lung: Secondary | ICD-10-CM | POA: Diagnosis not present

## 2019-03-28 DIAGNOSIS — M75102 Unspecified rotator cuff tear or rupture of left shoulder, not specified as traumatic: Secondary | ICD-10-CM | POA: Diagnosis not present

## 2019-03-28 DIAGNOSIS — M542 Cervicalgia: Secondary | ICD-10-CM | POA: Diagnosis not present

## 2019-03-28 DIAGNOSIS — C7951 Secondary malignant neoplasm of bone: Secondary | ICD-10-CM | POA: Diagnosis not present

## 2019-03-30 DIAGNOSIS — C3492 Malignant neoplasm of unspecified part of left bronchus or lung: Secondary | ICD-10-CM | POA: Diagnosis not present

## 2019-03-30 DIAGNOSIS — G319 Degenerative disease of nervous system, unspecified: Secondary | ICD-10-CM | POA: Diagnosis not present

## 2019-03-30 DIAGNOSIS — M542 Cervicalgia: Secondary | ICD-10-CM | POA: Diagnosis not present

## 2019-03-30 DIAGNOSIS — C7951 Secondary malignant neoplasm of bone: Secondary | ICD-10-CM | POA: Diagnosis not present

## 2019-03-30 DIAGNOSIS — S42211D Unspecified displaced fracture of surgical neck of right humerus, subsequent encounter for fracture with routine healing: Secondary | ICD-10-CM | POA: Diagnosis not present

## 2019-03-30 DIAGNOSIS — M75102 Unspecified rotator cuff tear or rupture of left shoulder, not specified as traumatic: Secondary | ICD-10-CM | POA: Diagnosis not present

## 2019-04-04 DIAGNOSIS — M75102 Unspecified rotator cuff tear or rupture of left shoulder, not specified as traumatic: Secondary | ICD-10-CM | POA: Diagnosis not present

## 2019-04-04 DIAGNOSIS — S42211D Unspecified displaced fracture of surgical neck of right humerus, subsequent encounter for fracture with routine healing: Secondary | ICD-10-CM | POA: Diagnosis not present

## 2019-04-04 DIAGNOSIS — M542 Cervicalgia: Secondary | ICD-10-CM | POA: Diagnosis not present

## 2019-04-04 DIAGNOSIS — G319 Degenerative disease of nervous system, unspecified: Secondary | ICD-10-CM | POA: Diagnosis not present

## 2019-04-04 DIAGNOSIS — C3492 Malignant neoplasm of unspecified part of left bronchus or lung: Secondary | ICD-10-CM | POA: Diagnosis not present

## 2019-04-04 DIAGNOSIS — C7951 Secondary malignant neoplasm of bone: Secondary | ICD-10-CM | POA: Diagnosis not present

## 2019-04-05 DIAGNOSIS — M25511 Pain in right shoulder: Secondary | ICD-10-CM | POA: Diagnosis not present

## 2019-04-06 DIAGNOSIS — C7951 Secondary malignant neoplasm of bone: Secondary | ICD-10-CM | POA: Diagnosis not present

## 2019-04-06 DIAGNOSIS — C3492 Malignant neoplasm of unspecified part of left bronchus or lung: Secondary | ICD-10-CM | POA: Diagnosis not present

## 2019-04-06 DIAGNOSIS — G319 Degenerative disease of nervous system, unspecified: Secondary | ICD-10-CM | POA: Diagnosis not present

## 2019-04-06 DIAGNOSIS — M542 Cervicalgia: Secondary | ICD-10-CM | POA: Diagnosis not present

## 2019-04-06 DIAGNOSIS — S42211D Unspecified displaced fracture of surgical neck of right humerus, subsequent encounter for fracture with routine healing: Secondary | ICD-10-CM | POA: Diagnosis not present

## 2019-04-06 DIAGNOSIS — M75102 Unspecified rotator cuff tear or rupture of left shoulder, not specified as traumatic: Secondary | ICD-10-CM | POA: Diagnosis not present

## 2019-04-11 DIAGNOSIS — C7951 Secondary malignant neoplasm of bone: Secondary | ICD-10-CM | POA: Diagnosis not present

## 2019-04-11 DIAGNOSIS — S42211D Unspecified displaced fracture of surgical neck of right humerus, subsequent encounter for fracture with routine healing: Secondary | ICD-10-CM | POA: Diagnosis not present

## 2019-04-11 DIAGNOSIS — C3492 Malignant neoplasm of unspecified part of left bronchus or lung: Secondary | ICD-10-CM | POA: Diagnosis not present

## 2019-04-11 DIAGNOSIS — G319 Degenerative disease of nervous system, unspecified: Secondary | ICD-10-CM | POA: Diagnosis not present

## 2019-04-11 DIAGNOSIS — M542 Cervicalgia: Secondary | ICD-10-CM | POA: Diagnosis not present

## 2019-04-11 DIAGNOSIS — M75102 Unspecified rotator cuff tear or rupture of left shoulder, not specified as traumatic: Secondary | ICD-10-CM | POA: Diagnosis not present

## 2019-04-12 DIAGNOSIS — S42201D Unspecified fracture of upper end of right humerus, subsequent encounter for fracture with routine healing: Secondary | ICD-10-CM | POA: Diagnosis not present

## 2019-04-12 DIAGNOSIS — M25611 Stiffness of right shoulder, not elsewhere classified: Secondary | ICD-10-CM | POA: Diagnosis not present

## 2019-04-17 DIAGNOSIS — M25611 Stiffness of right shoulder, not elsewhere classified: Secondary | ICD-10-CM | POA: Diagnosis not present

## 2019-04-17 DIAGNOSIS — S42201D Unspecified fracture of upper end of right humerus, subsequent encounter for fracture with routine healing: Secondary | ICD-10-CM | POA: Diagnosis not present

## 2019-04-19 DIAGNOSIS — M25611 Stiffness of right shoulder, not elsewhere classified: Secondary | ICD-10-CM | POA: Diagnosis not present

## 2019-04-19 DIAGNOSIS — S42201D Unspecified fracture of upper end of right humerus, subsequent encounter for fracture with routine healing: Secondary | ICD-10-CM | POA: Diagnosis not present

## 2019-04-26 DIAGNOSIS — S42201D Unspecified fracture of upper end of right humerus, subsequent encounter for fracture with routine healing: Secondary | ICD-10-CM | POA: Diagnosis not present

## 2019-04-26 DIAGNOSIS — M25611 Stiffness of right shoulder, not elsewhere classified: Secondary | ICD-10-CM | POA: Diagnosis not present

## 2019-05-01 ENCOUNTER — Other Ambulatory Visit: Payer: Self-pay

## 2019-05-01 ENCOUNTER — Inpatient Hospital Stay: Payer: Medicare Other | Attending: Internal Medicine

## 2019-05-01 ENCOUNTER — Ambulatory Visit (HOSPITAL_COMMUNITY)
Admission: RE | Admit: 2019-05-01 | Discharge: 2019-05-01 | Disposition: A | Discharge status: Home / Self Care | Payer: Medicare Other | Source: Ambulatory Visit | Attending: Internal Medicine | Admitting: Internal Medicine

## 2019-05-01 DIAGNOSIS — C3490 Malignant neoplasm of unspecified part of unspecified bronchus or lung: Secondary | ICD-10-CM

## 2019-05-01 DIAGNOSIS — C7951 Secondary malignant neoplasm of bone: Secondary | ICD-10-CM | POA: Insufficient documentation

## 2019-05-01 DIAGNOSIS — M25611 Stiffness of right shoulder, not elsewhere classified: Secondary | ICD-10-CM | POA: Diagnosis not present

## 2019-05-01 DIAGNOSIS — Z85118 Personal history of other malignant neoplasm of bronchus and lung: Secondary | ICD-10-CM | POA: Diagnosis not present

## 2019-05-01 DIAGNOSIS — C3412 Malignant neoplasm of upper lobe, left bronchus or lung: Secondary | ICD-10-CM | POA: Diagnosis not present

## 2019-05-01 DIAGNOSIS — S42201D Unspecified fracture of upper end of right humerus, subsequent encounter for fracture with routine healing: Secondary | ICD-10-CM | POA: Diagnosis not present

## 2019-05-01 DIAGNOSIS — R911 Solitary pulmonary nodule: Secondary | ICD-10-CM | POA: Diagnosis not present

## 2019-05-01 LAB — CBC WITH DIFFERENTIAL (CANCER CENTER ONLY)
Abs Immature Granulocytes: 0.01 10*3/uL (ref 0.00–0.07)
Basophils Absolute: 0 10*3/uL (ref 0.0–0.1)
Basophils Relative: 0 %
Eosinophils Absolute: 0.1 10*3/uL (ref 0.0–0.5)
Eosinophils Relative: 1 %
HCT: 39.8 % (ref 36.0–46.0)
Hemoglobin: 12.5 g/dL (ref 12.0–15.0)
Immature Granulocytes: 0 %
Lymphocytes Relative: 37 %
Lymphs Abs: 1.9 10*3/uL (ref 0.7–4.0)
MCH: 33 pg (ref 26.0–34.0)
MCHC: 31.4 g/dL (ref 30.0–36.0)
MCV: 105 fL — ABNORMAL HIGH (ref 80.0–100.0)
Monocytes Absolute: 0.7 10*3/uL (ref 0.1–1.0)
Monocytes Relative: 13 %
Neutro Abs: 2.5 10*3/uL (ref 1.7–7.7)
Neutrophils Relative %: 49 %
Platelet Count: 228 10*3/uL (ref 150–400)
RBC: 3.79 MIL/uL — ABNORMAL LOW (ref 3.87–5.11)
RDW: 12.5 % (ref 11.5–15.5)
WBC Count: 5.2 10*3/uL (ref 4.0–10.5)
nRBC: 0 % (ref 0.0–0.2)

## 2019-05-01 LAB — CMP (CANCER CENTER ONLY)
ALT: 23 U/L (ref 0–44)
AST: 25 U/L (ref 15–41)
Albumin: 3.9 g/dL (ref 3.5–5.0)
Alkaline Phosphatase: 69 U/L (ref 38–126)
Anion gap: 9 (ref 5–15)
BUN: 16 mg/dL (ref 8–23)
CO2: 28 mmol/L (ref 22–32)
Calcium: 9.5 mg/dL (ref 8.9–10.3)
Chloride: 106 mmol/L (ref 98–111)
Creatinine: 0.73 mg/dL (ref 0.44–1.00)
GFR, Est AFR Am: >60 mL/min (ref >60)
GFR, Estimated: >60 mL/min (ref >60)
Glucose, Bld: 91 mg/dL (ref 70–99)
Potassium: 4.3 mmol/L (ref 3.5–5.1)
Sodium: 143 mmol/L (ref 135–145)
Total Bilirubin: 0.3 mg/dL (ref 0.3–1.2)
Total Protein: 7.2 g/dL (ref 6.5–8.1)

## 2019-05-01 MED ORDER — SODIUM CHLORIDE (PF) 0.9 % IJ SOLN
INTRAMUSCULAR | Status: AC
  Filled 2019-05-01: qty 50

## 2019-05-01 MED ORDER — IOHEXOL 300 MG/ML  SOLN
100.0000 mL | Freq: Once | INTRAMUSCULAR | Status: AC | PRN
  Administered 2019-05-01: 80 mL via INTRAVENOUS

## 2019-05-03 DIAGNOSIS — M25611 Stiffness of right shoulder, not elsewhere classified: Secondary | ICD-10-CM | POA: Diagnosis not present

## 2019-05-03 DIAGNOSIS — S42201D Unspecified fracture of upper end of right humerus, subsequent encounter for fracture with routine healing: Secondary | ICD-10-CM | POA: Diagnosis not present

## 2019-05-04 ENCOUNTER — Other Ambulatory Visit: Payer: Self-pay

## 2019-05-04 ENCOUNTER — Encounter: Payer: Self-pay | Admitting: Internal Medicine

## 2019-05-04 ENCOUNTER — Inpatient Hospital Stay (HOSPITAL_BASED_OUTPATIENT_CLINIC_OR_DEPARTMENT_OTHER): Payer: Medicare Other | Admitting: Internal Medicine

## 2019-05-04 VITALS — BP 127/65 | HR 80 | Temp 98.4°F | Resp 17 | Ht 61.0 in | Wt 93.1 lb

## 2019-05-04 DIAGNOSIS — C3412 Malignant neoplasm of upper lobe, left bronchus or lung: Secondary | ICD-10-CM | POA: Diagnosis not present

## 2019-05-04 DIAGNOSIS — C3492 Malignant neoplasm of unspecified part of left bronchus or lung: Secondary | ICD-10-CM

## 2019-05-04 DIAGNOSIS — C7951 Secondary malignant neoplasm of bone: Secondary | ICD-10-CM

## 2019-05-04 DIAGNOSIS — Z5111 Encounter for antineoplastic chemotherapy: Secondary | ICD-10-CM

## 2019-05-04 NOTE — Progress Notes (Signed)
[place] Telephone:(336) [phone]   Fax:(336) [place], [place] [address]  DIAGNOSIS: Stage IV (T2 a, N2, M1c)non-small cell carcinoma, adenocarcinoma, presented with left upper lobe lung mass in addition to multiple pleural-based nodules as well as suspicious bone metastasis to the left humeral neck, left ninth and 10th ribs as well as right femoral neck.  Biomarker Findings Tumor Mutational Burden - TMB-Intermediate (16 Muts/Mb) Microsatellite status - MS-Stable Genomic Findings For a complete list of the genes assayed, please refer to the Appendix. EGFR exon 19 deletion ([id number]_A750del), amplification MDM2 amplification EPHA3 V206L RB1 loss exons 18-27 TP53 R273H, R280K 7 Disease relevant genes with no reportable alterations: KRAS, ALK, BRAF, MET, RET, ERBB2, ROS1  PDL 1 expression is 50%.  PRIOR THERAPY: None  CURRENT THERAPY: Tagrisso 80 mg p.o. daily first dose October 05, 2018.  Status post 6 months of treatment.  INTERVAL HISTORY: Claudia Perez 68 y.o. female returns to the clinic today for follow-up visit.  The patient is feeling fine today with no concerning complaints.  She continues to deny any concerning complaints today.  She continues to tolerate her treatment with Tagrisso fairly well.  The patient denied having any skin rash or diarrhea.  She denied having any chest pain, shortness of breath, cough or hemoptysis.  She has no nausea, vomiting, diarrhea or constipation.  She denied having any headache or visual changes.  She had repeat CT scan of the chest, abdomen pelvis performed recently which revealed for evaluation and discussion of her scan results.  MEDICAL HISTORY: Past Medical History:  Diagnosis Date   Abnormality of gait    Fall at home 03/27/2013   "lost consciousness; fractured right clavicle" (03/27/2013)   GERD (gastroesophageal reflux disease)     Hypercholesteremia    Hyperthyroidism    "a little bit; don't take RX for it" (03/27/2013)   Mobility impaired    Other closed skull fracture without mention of intracranial injury, unspecified state of consciousness    Seizures ([place])    "think I might have; been blacking out several times in the past" (03/27/2013)   Shortness of breath    "even now, just talking" (03/27/2013)   Subdural hemorrhage ([facility])    Vitamin D deficiency     ALLERGIES:  is allergic to nitrofurantoin; phenazopyridine; sulfamethoxazole-trimethoprim; and sulfamethoxazole.  MEDICATIONS:  Current Outpatient Medications  Medication Sig Dispense Refill   acetaminophen (TYLENOL) 500 MG tablet Take 1,000 mg by mouth daily.      Multiple Vitamin (MULTIVITAMIN) capsule Take 1 capsule by mouth daily.     oxyCODONE-acetaminophen (PERCOCET/ROXICET) 5-325 MG tablet Take 1 tablet by mouth every 4 (four) hours as needed for severe pain. 20 tablet 0   TAGRISSO 80 MG tablet TAKE 1 TABLET (80 MG TOTAL) BY MOUTH DAILY. (Patient taking differently: Take 80 mg by mouth daily. ) 30 tablet 1   Vitamin D, Ergocalciferol, 2000 units CAPS Take 1 capsule by mouth daily.      No current facility-administered medications for this visit.     SURGICAL HISTORY:  Past Surgical History:  Procedure Laterality Date   APPENDECTOMY  1960's   BRAIN SURGERY  10/2003   SDH evac   EYE SURGERY  02/2013   cataract extraction w/ intraocular lens implant    REVIEW OF SYSTEMS:  Constitutional: negative Eyes: negative Ears, nose, mouth, throat, and face: negative Respiratory: negative Cardiovascular: negative Gastrointestinal: negative Genitourinary:negative Integument/breast:  negative Hematologic/lymphatic: negative Musculoskeletal:negative Neurological: negative Behavioral/Psych: negative Endocrine: negative Allergic/Immunologic: negative   PHYSICAL EXAMINATION: General appearance: alert, cooperative and no distress Head:  Normocephalic, without obvious abnormality, atraumatic Neck: no adenopathy, no JVD, supple, symmetrical, trachea midline and thyroid not enlarged, symmetric, no tenderness/mass/nodules Lymph nodes: Cervical, supraclavicular, and axillary nodes normal. Resp: clear to auscultation bilaterally Back: symmetric, no curvature. ROM normal. No CVA tenderness. Cardio: regular rate and rhythm, S1, S2 normal, no murmur, click, rub or gallop GI: soft, non-tender; bowel sounds normal; no masses,  no organomegaly Extremities: extremities normal, atraumatic, no cyanosis or edema Neurologic: Alert and oriented X 3, normal strength and tone. Normal symmetric reflexes. Normal coordination and gait  ECOG PERFORMANCE STATUS: 1 - Symptomatic but completely ambulatory  Blood pressure 127/65, pulse 80, temperature 98.4 F (36.9 C), temperature source Oral, resp. rate 17, height '5\' 1"'$  (1.549 m), weight 93 lb 1.6 oz (42.2 kg), SpO2 100 %.  LABORATORY DATA: Lab Results  Component Value Date   WBC 5.2 05/01/2019   HGB 12.5 05/01/2019   HCT 39.8 05/01/2019   MCV 105.0 (H) 05/01/2019   PLT 228 05/01/2019      Chemistry      Component Value Date/Time   NA 143 05/01/2019 0843   K 4.3 05/01/2019 0843   CL 106 05/01/2019 0843   CO2 28 05/01/2019 0843   BUN 16 05/01/2019 0843   CREATININE 0.73 05/01/2019 0843   CREATININE 0.73 06/08/2014 1215      Component Value Date/Time   CALCIUM 9.5 05/01/2019 0843   ALKPHOS 69 05/01/2019 0843   AST 25 05/01/2019 0843   ALT 23 05/01/2019 0843   BILITOT 0.3 05/01/2019 0843       RADIOGRAPHIC STUDIES: Ct Chest W Contrast  Result Date: 05/01/2019 CLINICAL DATA:  Patient with history of non-small cell carcinoma. Follow-up exam. EXAM: CT CHEST, ABDOMEN, AND PELVIS WITH CONTRAST TECHNIQUE: Multidetector CT imaging of the chest, abdomen and pelvis was performed following the standard protocol during bolus administration of intravenous contrast. CONTRAST:  85m OMNIPAQUE  IOHEXOL 300 MG/ML  SOLN COMPARISON:  Chest CT 01/09/2019 FINDINGS: CT CHEST FINDINGS Cardiovascular: Normal heart size. No pericardial effusion. Aorta and main pulmonary artery normal in caliber. Mediastinum/Nodes: No enlarged axillary, mediastinal or hilar lymphadenopathy. Normal appearance of the esophagus. Lungs/Pleura: Central airways are patent. Dependent atelectasis within the bilateral lower lobes. No large area pulmonary consolidation. Similar-appearing 11 mm pleural-based nodule left lung apex (image 9; series 6). Similar-appearing 9 x 7 mm irregular nodule left upper lobe (image 72; series 6). No pleural effusion or pneumothorax. Musculoskeletal: Redemonstrated left humeral neck fracture. Focal area of sclerosis within the posterior left eleventh rib at the site of prior fracture, most compatible with healing fracture. Additionally, improved focal area of sclerosis within the left tenth rib (image 43; series 2), nonspecific. Similar-appearing wedge compression deformities of the T11 and T12 vertebral bodies involving the superior endplates. Additional similar-appearing mild wedge compression deformity of the superior endplate of the T8 vertebral body. CT ABDOMEN PELVIS FINDINGS Hepatobiliary: Liver is normal in size and contour. No focal lesion identified. Gallbladder is unremarkable. No intrahepatic or extrahepatic biliary ductal dilatation. Pancreas: Unremarkable Spleen: Unremarkable Adrenals/Urinary Tract: Normal adrenal glands. Kidneys enhance symmetrically with contrast. No hydronephrosis. Urinary bladder is unremarkable. Stomach/Bowel: Normal morphology of the stomach. No evidence for small bowel obstruction. No free fluid or free intraperitoneal air. Vascular/Lymphatic: Normal caliber abdominal aorta. Peripheral calcified atherosclerotic plaque. No retroperitoneal lymphadenopathy. Reproductive: Unremarkable Other: None. Musculoskeletal: Lumbar spine degenerative  changes. No aggressive or acute  appearing osseous lesions. IMPRESSION: 1. Similar-appearing left upper lobe pulmonary nodule. Similar-appearing pleural based nodularity within the left upper hemithorax. 2. No evidence for metastatic in the abdomen pelvis. Electronically Signed   By: Lovey Newcomer M.D.   On: 05/01/2019 10:55   Ct Abdomen Pelvis W Contrast  Result Date: 05/01/2019 CLINICAL DATA:  Patient with history of non-small cell carcinoma. Follow-up exam. EXAM: CT CHEST, ABDOMEN, AND PELVIS WITH CONTRAST TECHNIQUE: Multidetector CT imaging of the chest, abdomen and pelvis was performed following the standard protocol during bolus administration of intravenous contrast. CONTRAST:  20m OMNIPAQUE IOHEXOL 300 MG/ML  SOLN COMPARISON:  Chest CT 01/09/2019 FINDINGS: CT CHEST FINDINGS Cardiovascular: Normal heart size. No pericardial effusion. Aorta and main pulmonary artery normal in caliber. Mediastinum/Nodes: No enlarged axillary, mediastinal or hilar lymphadenopathy. Normal appearance of the esophagus. Lungs/Pleura: Central airways are patent. Dependent atelectasis within the bilateral lower lobes. No large area pulmonary consolidation. Similar-appearing 11 mm pleural-based nodule left lung apex (image 9; series 6). Similar-appearing 9 x 7 mm irregular nodule left upper lobe (image 72; series 6). No pleural effusion or pneumothorax. Musculoskeletal: Redemonstrated left humeral neck fracture. Focal area of sclerosis within the posterior left eleventh rib at the site of prior fracture, most compatible with healing fracture. Additionally, improved focal area of sclerosis within the left tenth rib (image 43; series 2), nonspecific. Similar-appearing wedge compression deformities of the T11 and T12 vertebral bodies involving the superior endplates. Additional similar-appearing mild wedge compression deformity of the superior endplate of the T8 vertebral body. CT ABDOMEN PELVIS FINDINGS Hepatobiliary: Liver is normal in size and contour. No focal  lesion identified. Gallbladder is unremarkable. No intrahepatic or extrahepatic biliary ductal dilatation. Pancreas: Unremarkable Spleen: Unremarkable Adrenals/Urinary Tract: Normal adrenal glands. Kidneys enhance symmetrically with contrast. No hydronephrosis. Urinary bladder is unremarkable. Stomach/Bowel: Normal morphology of the stomach. No evidence for small bowel obstruction. No free fluid or free intraperitoneal air. Vascular/Lymphatic: Normal caliber abdominal aorta. Peripheral calcified atherosclerotic plaque. No retroperitoneal lymphadenopathy. Reproductive: Unremarkable Other: None. Musculoskeletal: Lumbar spine degenerative changes. No aggressive or acute appearing osseous lesions. IMPRESSION: 1. Similar-appearing left upper lobe pulmonary nodule. Similar-appearing pleural based nodularity within the left upper hemithorax. 2. No evidence for metastatic in the abdomen pelvis. Electronically Signed   By: DLovey NewcomerM.D.   On: 05/01/2019 10:55    ASSESSMENT AND PLAN: This is a very pleasant 68years old African-American female diagnosed with a stage IV non-small cell lung cancer, adenocarcinoma with positive EGFR mutation with deletion in exon 19.  The patient was started on treatment with Tagrisso 80 mg p.o. daily status post 6 months of treatment. The patient has been tolerating this treatment well with no concerning adverse effects. She had repeat CT scan of the chest, abdomen pelvis performed recently.  I personally and independently reviewed the scans and discussed the results with the patient today. Her scan showed no concerning findings for disease progression. I recommended for the patient to continue her current treatment with Tagrisso with the same dose. She will come back for follow-up visit in 2 months for evaluation with repeat blood work. She was advised to call immediately if she has any concerning symptoms in the interval. The patient voices understanding of current disease status  and treatment options and is in agreement with the current care plan. All questions were answered. The patient knows to call the clinic with any problems, questions or concerns. We can certainly see the patient much sooner  if necessary.  Disclaimer: This note was dictated with voice recognition software. Similar sounding words can inadvertently be transcribed and may not be corrected upon review.

## 2019-05-05 ENCOUNTER — Telehealth: Payer: Self-pay | Admitting: Internal Medicine

## 2019-05-05 NOTE — Telephone Encounter (Signed)
Scheduled appt per 6/4 los - reminder letter mailed with appt date and time

## 2019-05-08 DIAGNOSIS — M25611 Stiffness of right shoulder, not elsewhere classified: Secondary | ICD-10-CM | POA: Diagnosis not present

## 2019-05-08 DIAGNOSIS — S42201D Unspecified fracture of upper end of right humerus, subsequent encounter for fracture with routine healing: Secondary | ICD-10-CM | POA: Diagnosis not present

## 2019-05-09 ENCOUNTER — Inpatient Hospital Stay: Payer: Medicare Other

## 2019-05-09 NOTE — Progress Notes (Signed)
Nutrition Follow-up:  RD working remotely.  Patient with lung cancer followed by Dr. Earlie Server.  Patient taking Tagrisso.    Called patient for nutrition follow-up.  Patient reports that her appetite is doing good.  Denies nausea at this time.  Reports bowels are moving regularly.  Reports that she typically has oatmeal for breakfast a sandwich for lunch and macaroni and cheese or potato for dinner.  Snacks during the day on cheese and crackers and doughnuts.  Drinks 1 ensure per day more give her diarrhea.  Likes peanut butter and egg omelet (1 time per week).      Medications: reviewed  Labs: reviewed  Anthropometrics:   Weight 93 lb 1.6 oz decreased from last  Weight of 94 lb in March 2020   NUTRITION DIAGNOSIS: Unintentional weight loss continues   INTERVENTION:  Discussed good sources of protein and encouraged at every meal/snack.   Patient may benefit from appetite stimulant if weight continues to decline.  Contact information provided    MONITORING, EVALUATION, GOAL: Patient will increase oral intake to minimize weight loss   NEXT VISIT: Tuesday, July 14 (phone)  Miriana Gaertner B. Zenia Resides, [place], [place] Registered Dietitian [phone] (pager)

## 2019-05-10 DIAGNOSIS — M25611 Stiffness of right shoulder, not elsewhere classified: Secondary | ICD-10-CM | POA: Diagnosis not present

## 2019-05-10 DIAGNOSIS — S42201D Unspecified fracture of upper end of right humerus, subsequent encounter for fracture with routine healing: Secondary | ICD-10-CM | POA: Diagnosis not present

## 2019-05-15 DIAGNOSIS — M25611 Stiffness of right shoulder, not elsewhere classified: Secondary | ICD-10-CM | POA: Diagnosis not present

## 2019-05-15 DIAGNOSIS — S42201D Unspecified fracture of upper end of right humerus, subsequent encounter for fracture with routine healing: Secondary | ICD-10-CM | POA: Diagnosis not present

## 2019-05-17 DIAGNOSIS — M25611 Stiffness of right shoulder, not elsewhere classified: Secondary | ICD-10-CM | POA: Diagnosis not present

## 2019-05-22 DIAGNOSIS — M25611 Stiffness of right shoulder, not elsewhere classified: Secondary | ICD-10-CM | POA: Diagnosis not present

## 2019-05-22 DIAGNOSIS — S42201D Unspecified fracture of upper end of right humerus, subsequent encounter for fracture with routine healing: Secondary | ICD-10-CM | POA: Diagnosis not present

## 2019-05-23 DIAGNOSIS — K219 Gastro-esophageal reflux disease without esophagitis: Secondary | ICD-10-CM | POA: Diagnosis not present

## 2019-05-23 DIAGNOSIS — R269 Unspecified abnormalities of gait and mobility: Secondary | ICD-10-CM | POA: Diagnosis not present

## 2019-05-23 DIAGNOSIS — R479 Unspecified speech disturbances: Secondary | ICD-10-CM | POA: Diagnosis not present

## 2019-05-23 DIAGNOSIS — E7800 Pure hypercholesterolemia, unspecified: Secondary | ICD-10-CM | POA: Diagnosis not present

## 2019-05-23 DIAGNOSIS — E559 Vitamin D deficiency, unspecified: Secondary | ICD-10-CM | POA: Diagnosis not present

## 2019-05-23 DIAGNOSIS — R296 Repeated falls: Secondary | ICD-10-CM | POA: Diagnosis not present

## 2019-05-23 DIAGNOSIS — C3490 Malignant neoplasm of unspecified part of unspecified bronchus or lung: Secondary | ICD-10-CM | POA: Diagnosis not present

## 2019-05-24 DIAGNOSIS — M25611 Stiffness of right shoulder, not elsewhere classified: Secondary | ICD-10-CM | POA: Diagnosis not present

## 2019-05-24 DIAGNOSIS — S42201D Unspecified fracture of upper end of right humerus, subsequent encounter for fracture with routine healing: Secondary | ICD-10-CM | POA: Diagnosis not present

## 2019-05-29 DIAGNOSIS — S42201D Unspecified fracture of upper end of right humerus, subsequent encounter for fracture with routine healing: Secondary | ICD-10-CM | POA: Diagnosis not present

## 2019-05-29 DIAGNOSIS — M25611 Stiffness of right shoulder, not elsewhere classified: Secondary | ICD-10-CM | POA: Diagnosis not present

## 2019-05-31 DIAGNOSIS — M25611 Stiffness of right shoulder, not elsewhere classified: Secondary | ICD-10-CM | POA: Diagnosis not present

## 2019-05-31 DIAGNOSIS — S42201D Unspecified fracture of upper end of right humerus, subsequent encounter for fracture with routine healing: Secondary | ICD-10-CM | POA: Diagnosis not present

## 2019-06-01 DIAGNOSIS — R404 Transient alteration of awareness: Secondary | ICD-10-CM | POA: Diagnosis not present

## 2019-06-01 DIAGNOSIS — R4020 Unspecified coma: Secondary | ICD-10-CM | POA: Diagnosis not present

## 2019-06-01 DIAGNOSIS — R0689 Other abnormalities of breathing: Secondary | ICD-10-CM | POA: Diagnosis not present

## 2019-06-01 DIAGNOSIS — I499 Cardiac arrhythmia, unspecified: Secondary | ICD-10-CM | POA: Diagnosis not present

## 2019-06-05 ENCOUNTER — Telehealth: Payer: Self-pay | Admitting: Medical Oncology

## 2019-06-05 NOTE — Telephone Encounter (Signed)
I have received her death certificate. Not sure the cause of death. Lung cancer was looking good on the last scan 2 weeks ago.

## 2019-06-05 NOTE — Telephone Encounter (Signed)
Received fax that pt deceased 06/16/19 -calling about death certificiate.

## 2019-06-13 ENCOUNTER — Inpatient Hospital Stay: Payer: BC Managed Care – PPO

## 2019-07-01 DIAGNOSIS — 419620001 Death: Secondary | SNOMED CT | POA: Diagnosis not present

## 2019-07-01 DEATH — deceased

## 2019-07-06 ENCOUNTER — Ambulatory Visit: Payer: Medicare Other | Admitting: Internal Medicine

## 2019-07-06 ENCOUNTER — Other Ambulatory Visit: Payer: Medicare Other

## 2020-04-06 IMAGING — MR MR HEAD WO/W CM
13 of 15 series · 40 of 48 positions shown · IV contrast (gadavist)
Comparison: None.

CLINICAL DATA: Central vertigo.

EXAM:
MRI HEAD WITHOUT AND WITH CONTRAST
TECHNIQUE: Multiplanar, multiecho pulse sequences of the brain and surrounding
structures were obtained without and with intravenous contrast.
CONTRAST:  5 mL Gadavist

[Series 5: DWI · axial · 3.0mm · 0.88mm/px · z∈[-54,+73]mm · 5 of 88 slices shown (1 of 4)]
[im 1/88]
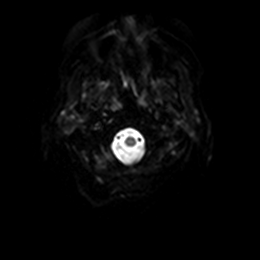
[im 22/88]
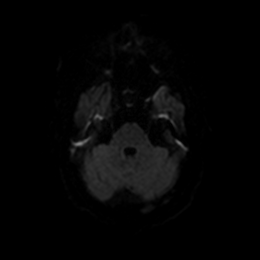
[im 44/88]
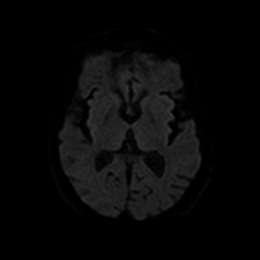
[im 66/88]
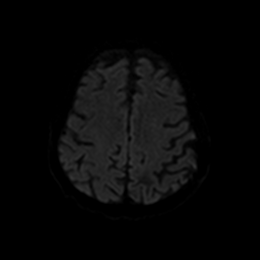
[im 88/88]
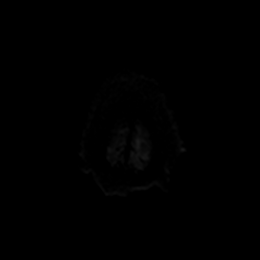

[Series 6: DWI · axial · 3.0mm · 0.88mm/px · z∈[-54,+73]mm · 3 of 44 slices shown (2 of 4)]
[im 1/44]
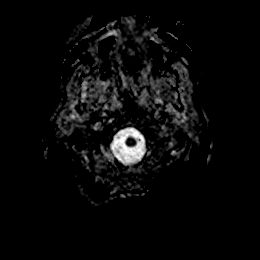
[im 22/44]
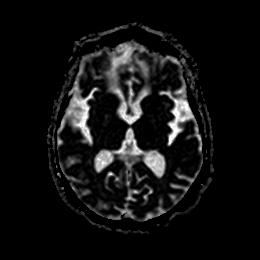
[im 44/44]
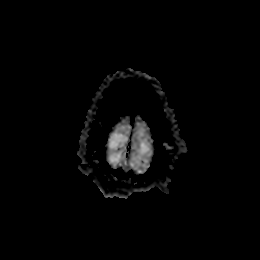

[Series 7: DWI · coronal · 4.0mm · 0.88mm/px · 4 of 64 slices shown (3 of 4)]
[im 1/64]
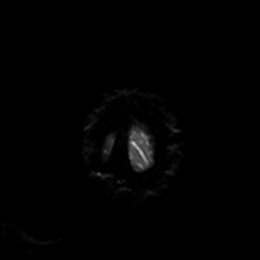
[im 22/64]
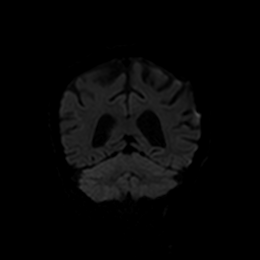
[im 43/64]
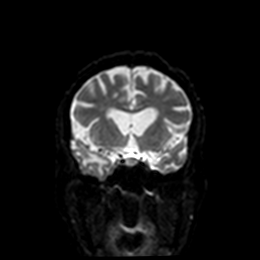
[im 64/64]
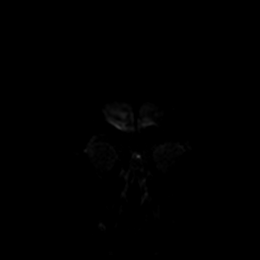

[Series 8: DWI · coronal · 4.0mm · 0.88mm/px · 2 of 32 slices shown (4 of 4)]
[im 1/32]
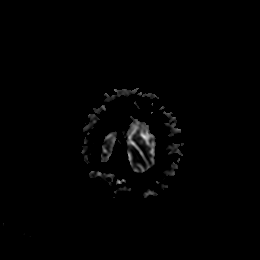
[im 32/32]
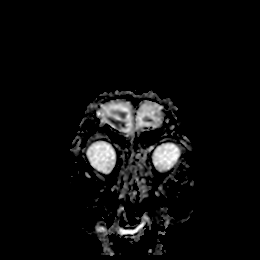

[Series 9: T1 · sagittal · 5.0mm · 0.75mm/px · 2 of 23 slices shown]
[im 1/23]
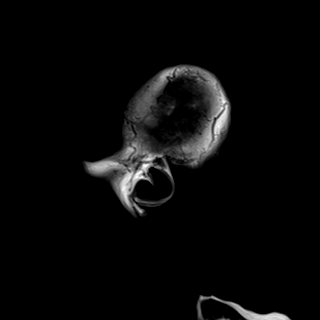
[im 23/23]
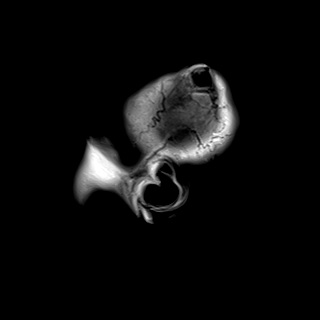

[Series 10: T2 · axial · 5.0mm · 0.72mm/px · z∈[-60,+82]mm · 2 of 25 slices shown]
[im 1/25]
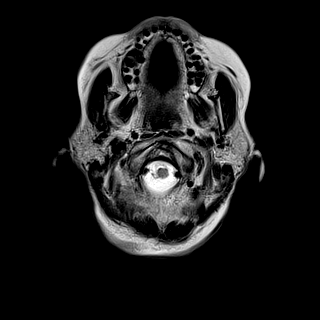
[im 25/25]
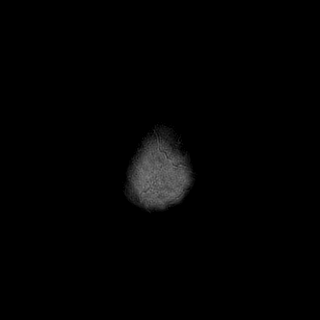

[Series 11: FLAIR · axial · 5.0mm · 0.45mm/px · z∈[-57,+84]mm · 2 of 25 slices shown]
[im 1/25]
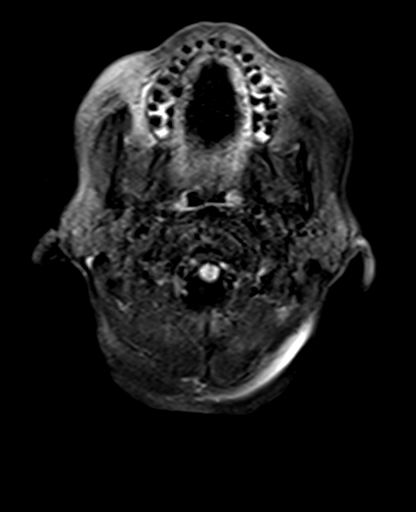
[im 25/25]
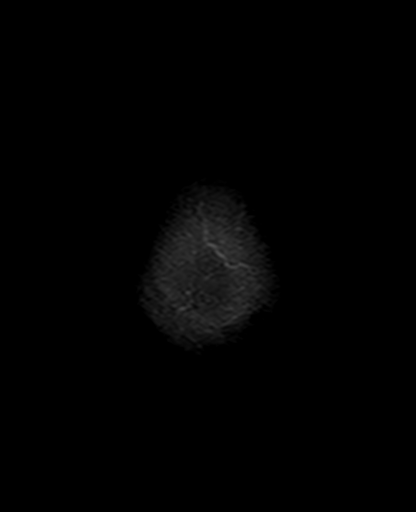

[Series 12: mag_images · axial · 3.0mm · 0.90mm/px · z∈[-74,+101]mm · 4 of 60 slices shown]
[im 1/60]
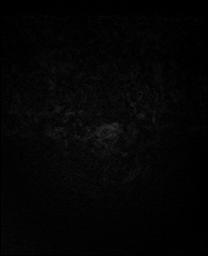
[im 20/60]
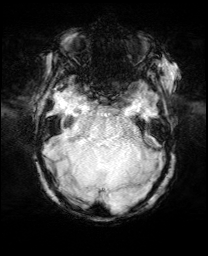
[im 40/60]
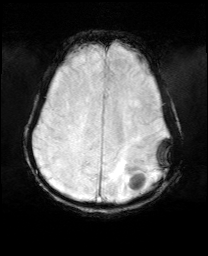
[im 60/60]
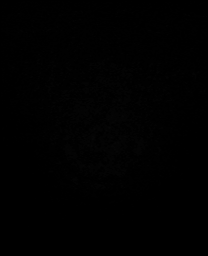

[Series 13: pha_images · axial · 3.0mm · 0.90mm/px · z∈[-68,+101]mm · 4 of 58 slices shown]
[im 1/58]
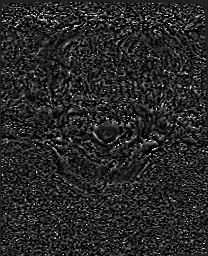
[im 20/58]
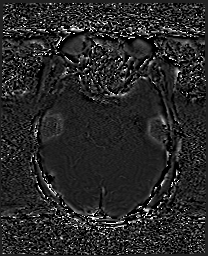
[im 39/58]
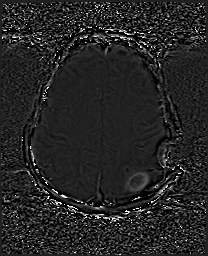
[im 58/58]
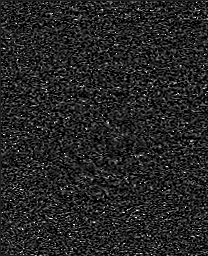

[Series 14: swi_images · axial · 3.0mm · 0.90mm/px · z∈[-74,+101]mm · 4 of 60 slices shown]
[im 1/60]
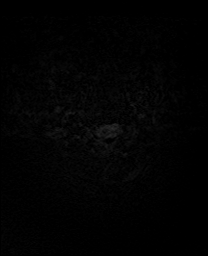
[im 20/60]
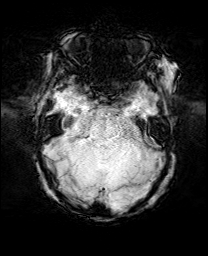
[im 40/60]
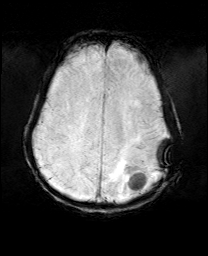
[im 60/60]
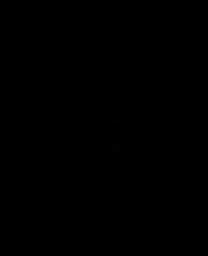

[Series 15: mip_images(sw) · axial · 24.0mm · 0.90mm/px · z∈[-63,+90]mm · 4 of 53 slices shown]
[im 1/53]
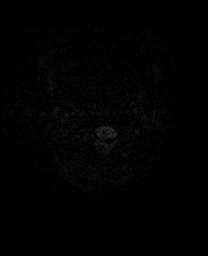
[im 18/53]
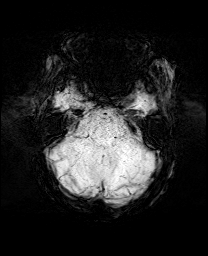
[im 35/53]
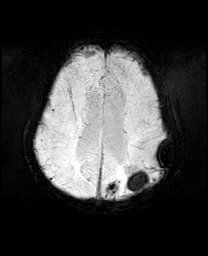
[im 53/53]
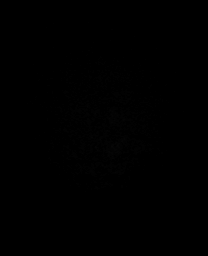

[Series 17: T2 post-contrast · coronal · 5.0mm · 0.72mm/px · 2 of 28 slices shown]
[im 1/28]
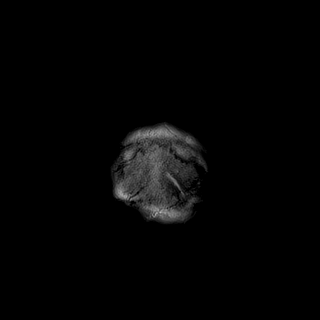
[im 28/28]
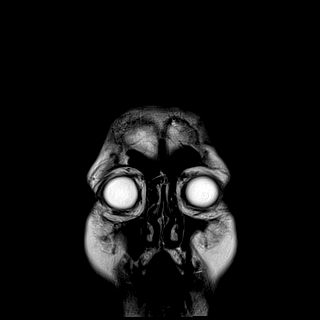

[Series 19: T1 post-contrast · coronal · 5.0mm · 0.34mm/px · 2 of 28 slices shown]
[im 1/28]
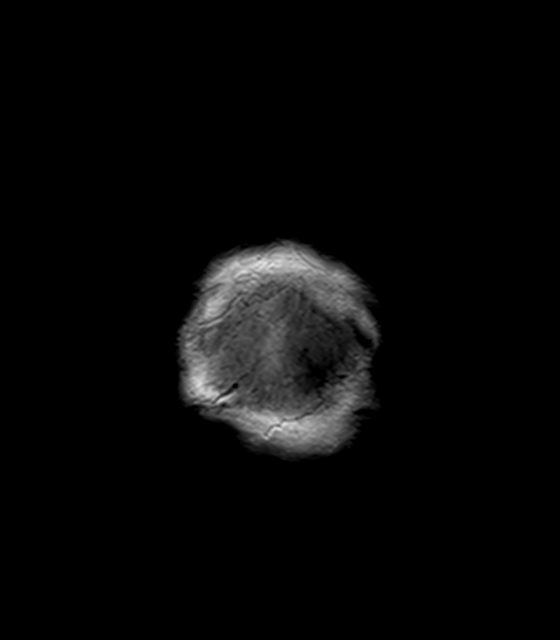
[im 28/28]
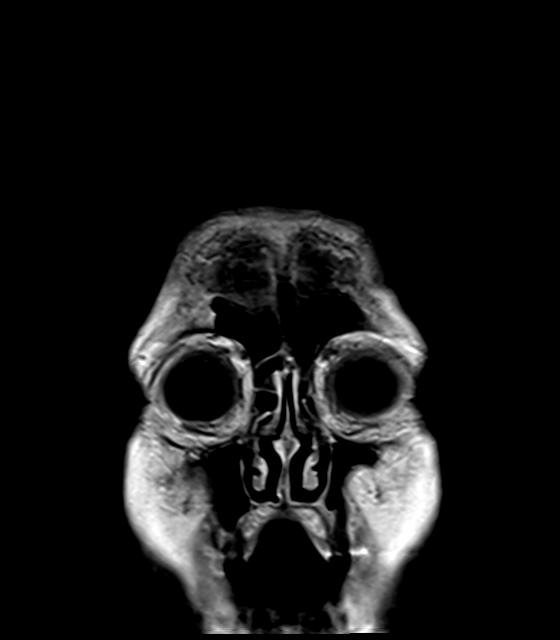

[40 of 48 positions shown; findings below may reference images not displayed]

FINDINGS: BRAIN: There is no acute infarct, acute hemorrhage, hydrocephalus or
extra-axial collection. Partially empty sella. No midline shift or
other mass effect. Multifocal encephalomalacia of the frontal lobes,
temporal lobes and left parietal lobe. Multifocal periventricular
white matter hyperintensity, most often a result of chronic
microvascular ischemia. Advanced atrophy for age.
Susceptibility-sensitive sequences show no chronic microhemorrhage
or superficial siderosis. No abnormal contrast enhancement.

VASCULAR: Major intracranial arterial and venous sinus flow voids
are normal.

SKULL AND UPPER CERVICAL SPINE: Remote left parietal craniectomy.

SINUSES/ORBITS: No fluid levels or advanced mucosal thickening. No
mastoid or middle ear effusion. The orbits are normal.
IMPRESSION: 1. No acute intracranial abnormality.
2. Severe multifocal encephalomalacia, unchanged.
